# Patient Record
Sex: Male | Born: 1950 | Race: White | Hispanic: No | Smoking: Current every day smoker
Health system: Southern US, Community
[De-identification: ages and names within clinical notes are randomized; demographics above are authoritative.]

## PROBLEM LIST (undated history)

## (undated) DIAGNOSIS — F419 Anxiety disorder, unspecified: Secondary | ICD-10-CM

## (undated) DIAGNOSIS — G4730 Sleep apnea, unspecified: Secondary | ICD-10-CM

## (undated) DIAGNOSIS — Z8719 Personal history of other diseases of the digestive system: Secondary | ICD-10-CM

## (undated) DIAGNOSIS — K2270 Barrett's esophagus without dysplasia: Secondary | ICD-10-CM

## (undated) DIAGNOSIS — I10 Essential (primary) hypertension: Secondary | ICD-10-CM

## (undated) DIAGNOSIS — Z8619 Personal history of other infectious and parasitic diseases: Secondary | ICD-10-CM

## (undated) DIAGNOSIS — R0602 Shortness of breath: Secondary | ICD-10-CM

## (undated) DIAGNOSIS — Z87898 Personal history of other specified conditions: Secondary | ICD-10-CM

## (undated) DIAGNOSIS — E785 Hyperlipidemia, unspecified: Secondary | ICD-10-CM

## (undated) DIAGNOSIS — K219 Gastro-esophageal reflux disease without esophagitis: Secondary | ICD-10-CM

## (undated) HISTORY — DX: Personal history of other infectious and parasitic diseases: Z86.19

## (undated) HISTORY — DX: Anxiety disorder, unspecified: F41.9

## (undated) HISTORY — DX: Sleep apnea, unspecified: G47.30

## (undated) HISTORY — PX: NECK SURGERY: SHX720

## (undated) HISTORY — PX: ABDOMINAL SURGERY: SHX537

## (undated) HISTORY — PX: HERNIA REPAIR: SHX51

## (undated) HISTORY — DX: Hyperlipidemia, unspecified: E78.5

---
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
## 1997-01-11 HISTORY — PX: NISSEN FUNDOPLICATION: SHX2091

## 1997-02-06 DIAGNOSIS — K2270 Barrett's esophagus without dysplasia: Secondary | ICD-10-CM | POA: Insufficient documentation

## 1997-02-06 DIAGNOSIS — I10 Essential (primary) hypertension: Secondary | ICD-10-CM | POA: Insufficient documentation

## 2003-02-19 ENCOUNTER — Encounter: Admission: RE | Admit: 2003-02-19 | Discharge: 2003-02-19 | Payer: Self-pay | Admitting: Neurosurgery

## 2003-03-12 ENCOUNTER — Ambulatory Visit (HOSPITAL_COMMUNITY): Admission: RE | Admit: 2003-03-12 | Discharge: 2003-03-13 | Payer: Self-pay | Admitting: Neurosurgery

## 2003-04-03 ENCOUNTER — Encounter: Admission: RE | Admit: 2003-04-03 | Discharge: 2003-04-03 | Payer: Self-pay | Admitting: Neurosurgery

## 2003-10-21 ENCOUNTER — Ambulatory Visit: Payer: Self-pay | Admitting: Family Medicine

## 2003-11-26 ENCOUNTER — Emergency Department: Payer: Self-pay | Admitting: Emergency Medicine

## 2003-12-03 ENCOUNTER — Other Ambulatory Visit: Payer: Self-pay

## 2003-12-04 ENCOUNTER — Ambulatory Visit: Payer: Self-pay | Admitting: Unknown Physician Specialty

## 2003-12-04 HISTORY — PX: BACK SURGERY: SHX140

## 2004-02-14 ENCOUNTER — Ambulatory Visit: Payer: Self-pay | Admitting: Physician Assistant

## 2004-08-14 ENCOUNTER — Ambulatory Visit: Payer: Self-pay | Admitting: Family Medicine

## 2004-08-20 ENCOUNTER — Ambulatory Visit: Payer: Self-pay | Admitting: Podiatry

## 2005-03-05 ENCOUNTER — Ambulatory Visit: Payer: Self-pay | Admitting: Gastroenterology

## 2005-12-05 DIAGNOSIS — F339 Major depressive disorder, recurrent, unspecified: Secondary | ICD-10-CM | POA: Insufficient documentation

## 2005-12-07 DIAGNOSIS — M545 Low back pain, unspecified: Secondary | ICD-10-CM | POA: Insufficient documentation

## 2005-12-07 DIAGNOSIS — G8929 Other chronic pain: Secondary | ICD-10-CM | POA: Insufficient documentation

## 2005-12-17 ENCOUNTER — Ambulatory Visit: Payer: Self-pay | Admitting: Family Medicine

## 2006-03-04 ENCOUNTER — Ambulatory Visit: Payer: Self-pay | Admitting: Physician Assistant

## 2006-03-18 ENCOUNTER — Other Ambulatory Visit: Payer: Self-pay

## 2006-03-18 ENCOUNTER — Ambulatory Visit: Payer: Self-pay | Admitting: Unknown Physician Specialty

## 2006-03-25 ENCOUNTER — Ambulatory Visit: Payer: Self-pay | Admitting: Unknown Physician Specialty

## 2006-04-28 DIAGNOSIS — N529 Male erectile dysfunction, unspecified: Secondary | ICD-10-CM | POA: Insufficient documentation

## 2006-08-13 DIAGNOSIS — Z8249 Family history of ischemic heart disease and other diseases of the circulatory system: Secondary | ICD-10-CM | POA: Insufficient documentation

## 2006-08-15 DIAGNOSIS — E785 Hyperlipidemia, unspecified: Secondary | ICD-10-CM | POA: Insufficient documentation

## 2006-10-10 HISTORY — PX: OTHER SURGICAL HISTORY: SHX169

## 2006-12-14 DIAGNOSIS — M25549 Pain in joints of unspecified hand: Secondary | ICD-10-CM | POA: Insufficient documentation

## 2007-03-08 DIAGNOSIS — J309 Allergic rhinitis, unspecified: Secondary | ICD-10-CM | POA: Insufficient documentation

## 2007-04-05 ENCOUNTER — Ambulatory Visit: Payer: Self-pay | Admitting: Unknown Physician Specialty

## 2007-04-09 HISTORY — PX: UPPER GI ENDOSCOPY: SHX6162

## 2007-12-25 DIAGNOSIS — E291 Testicular hypofunction: Secondary | ICD-10-CM | POA: Insufficient documentation

## 2008-05-03 ENCOUNTER — Ambulatory Visit: Payer: Self-pay | Admitting: Gastroenterology

## 2008-05-06 ENCOUNTER — Ambulatory Visit: Payer: Self-pay | Admitting: Gastroenterology

## 2008-05-06 DIAGNOSIS — L570 Actinic keratosis: Secondary | ICD-10-CM | POA: Insufficient documentation

## 2008-10-26 ENCOUNTER — Emergency Department: Payer: Self-pay | Admitting: Emergency Medicine

## 2008-10-27 ENCOUNTER — Emergency Department: Payer: Self-pay | Admitting: Emergency Medicine

## 2009-07-22 DIAGNOSIS — G4700 Insomnia, unspecified: Secondary | ICD-10-CM | POA: Insufficient documentation

## 2010-03-25 ENCOUNTER — Emergency Department: Payer: Self-pay | Admitting: Emergency Medicine

## 2010-05-24 ENCOUNTER — Inpatient Hospital Stay: Payer: Self-pay | Admitting: Psychiatry

## 2010-05-27 LAB — PSA

## 2010-05-28 DIAGNOSIS — Z915 Personal history of self-harm: Secondary | ICD-10-CM | POA: Insufficient documentation

## 2011-06-18 ENCOUNTER — Ambulatory Visit: Payer: Self-pay | Admitting: Unknown Physician Specialty

## 2011-06-18 LAB — HM COLONOSCOPY: HM Colonoscopy: NORMAL

## 2011-06-22 LAB — PATHOLOGY REPORT

## 2012-07-14 ENCOUNTER — Ambulatory Visit: Payer: Self-pay | Admitting: Family Medicine

## 2012-07-14 LAB — URINALYSIS, COMPLETE
Bilirubin,UR: NEGATIVE
Blood: NEGATIVE
Glucose,UR: NEGATIVE mg/dL (ref 0–75)
Ketone: NEGATIVE
Leukocyte Esterase: NEGATIVE
Nitrite: NEGATIVE
Ph: 8 (ref 4.5–8.0)
Protein: NEGATIVE
Specific Gravity: 1.01 (ref 1.003–1.030)
Squamous Epithelial: NONE SEEN
WBC UR: NONE SEEN /HPF (ref 0–5)

## 2012-07-14 LAB — GC/CHLAMYDIA PROBE AMP

## 2012-07-16 LAB — URINE CULTURE

## 2012-10-18 ENCOUNTER — Encounter (HOSPITAL_COMMUNITY): Payer: Self-pay | Admitting: Emergency Medicine

## 2012-10-18 ENCOUNTER — Emergency Department: Payer: Self-pay | Admitting: Emergency Medicine

## 2012-10-18 ENCOUNTER — Encounter (HOSPITAL_COMMUNITY): Payer: 59 | Admitting: Anesthesiology

## 2012-10-18 ENCOUNTER — Encounter (HOSPITAL_COMMUNITY): Admission: EM | Discharge status: Against Medical Advice | Payer: Self-pay | Source: Home / Self Care

## 2012-10-18 ENCOUNTER — Emergency Department (HOSPITAL_COMMUNITY)
Admission: EM | Admit: 2012-10-18 | Discharge: 2012-10-18 | Disposition: A | Discharge status: Home / Self Care | Payer: 59 | Attending: Orthopedic Surgery | Admitting: Orthopedic Surgery

## 2012-10-18 ENCOUNTER — Emergency Department (HOSPITAL_COMMUNITY): Payer: 59 | Admitting: Anesthesiology

## 2012-10-18 DIAGNOSIS — S61509A Unspecified open wound of unspecified wrist, initial encounter: Secondary | ICD-10-CM | POA: Insufficient documentation

## 2012-10-18 DIAGNOSIS — S66909A Unspecified injury of unspecified muscle, fascia and tendon at wrist and hand level, unspecified hand, initial encounter: Secondary | ICD-10-CM | POA: Insufficient documentation

## 2012-10-18 DIAGNOSIS — I10 Essential (primary) hypertension: Secondary | ICD-10-CM | POA: Insufficient documentation

## 2012-10-18 DIAGNOSIS — S55809A Unspecified injury of other blood vessels at forearm level, unspecified arm, initial encounter: Secondary | ICD-10-CM | POA: Insufficient documentation

## 2012-10-18 DIAGNOSIS — W268XXA Contact with other sharp object(s), not elsewhere classified, initial encounter: Secondary | ICD-10-CM | POA: Insufficient documentation

## 2012-10-18 HISTORY — PX: NERVE, TENDON AND ARTERY REPAIR: SHX5695

## 2012-10-18 HISTORY — DX: Gastro-esophageal reflux disease without esophagitis: K21.9

## 2012-10-18 HISTORY — PX: WOUND EXPLORATION: SHX6188

## 2012-10-18 HISTORY — DX: Essential (primary) hypertension: I10

## 2012-10-18 LAB — CBC
HCT: 38.9 % — ABNORMAL LOW (ref 39.0–52.0)
Hemoglobin: 13.2 g/dL (ref 13.0–17.0)
MCH: 31.5 pg (ref 26.0–34.0)
MCHC: 33.9 g/dL (ref 30.0–36.0)
MCV: 92.8 fL (ref 78.0–100.0)
Platelets: 184 10*3/uL (ref 150–400)
RBC: 4.19 MIL/uL — ABNORMAL LOW (ref 4.22–5.81)
RDW: 12.5 % (ref 11.5–15.5)
WBC: 9.5 10*3/uL (ref 4.0–10.5)

## 2012-10-18 LAB — BASIC METABOLIC PANEL
BUN: 17 mg/dL (ref 6–23)
CO2: 30 mEq/L (ref 19–32)
Calcium: 8.8 mg/dL (ref 8.4–10.5)
Chloride: 103 mEq/L (ref 96–112)
Creatinine, Ser: 0.72 mg/dL (ref 0.50–1.35)
GFR calc Af Amer: >90 mL/min (ref >90)
GFR calc non Af Amer: >90 mL/min (ref >90)
Glucose, Bld: 111 mg/dL — ABNORMAL HIGH (ref 70–99)
Potassium: 4.5 mEq/L (ref 3.5–5.1)
Sodium: 140 mEq/L (ref 135–145)

## 2012-10-18 SURGERY — NERVE, TENDON AND ARTERY REPAIR
Anesthesia: General | Laterality: Left | Wound class: Contaminated

## 2012-10-18 MED ORDER — CEFAZOLIN SODIUM-DEXTROSE 2-3 GM-% IV SOLR
INTRAVENOUS | Status: DC | PRN
  Administered 2012-10-18: 2 g via INTRAVENOUS

## 2012-10-18 MED ORDER — EPHEDRINE SULFATE 50 MG/ML IJ SOLN
INTRAMUSCULAR | Status: DC | PRN
  Administered 2012-10-18: 5 mg via INTRAVENOUS
  Administered 2012-10-18: 10 mg via INTRAVENOUS

## 2012-10-18 MED ORDER — SUCCINYLCHOLINE CHLORIDE 20 MG/ML IJ SOLN
INTRAMUSCULAR | Status: DC | PRN
  Administered 2012-10-18: 100 mg via INTRAVENOUS

## 2012-10-18 MED ORDER — LACTATED RINGERS IV SOLN
INTRAVENOUS | Status: DC | PRN
  Administered 2012-10-18 (×2): via INTRAVENOUS

## 2012-10-18 MED ORDER — 0.9 % SODIUM CHLORIDE (POUR BTL) OPTIME
TOPICAL | Status: DC | PRN
  Administered 2012-10-18: 1000 mL

## 2012-10-18 MED ORDER — MIDAZOLAM HCL 5 MG/5ML IJ SOLN
INTRAMUSCULAR | Status: DC | PRN
  Administered 2012-10-18: 2 mg via INTRAVENOUS

## 2012-10-18 MED ORDER — CEPHALEXIN 500 MG PO CAPS
500.0000 mg | ORAL_CAPSULE | Freq: Three times a day (TID) | ORAL | Status: DC
  Administered 2012-10-18: 500 mg via ORAL
  Filled 2012-10-18 (×4): qty 1

## 2012-10-18 MED ORDER — HYDROMORPHONE HCL PF 1 MG/ML IJ SOLN
INTRAMUSCULAR | Status: AC
  Filled 2012-10-18: qty 1

## 2012-10-18 MED ORDER — DOCUSATE SODIUM 100 MG PO CAPS: 100.0000 mg | ORAL_CAPSULE | Freq: Two times a day (BID) | ORAL | Status: DC

## 2012-10-18 MED ORDER — OXYCODONE HCL 5 MG PO TABS
ORAL_TABLET | ORAL | Status: AC
  Filled 2012-10-18: qty 1

## 2012-10-18 MED ORDER — OXYCODONE HCL 5 MG PO TABS
5.0000 mg | ORAL_TABLET | Freq: Once | ORAL | Status: AC | PRN
  Administered 2012-10-18: 5 mg via ORAL

## 2012-10-18 MED ORDER — OXYCODONE-ACETAMINOPHEN 5-325 MG PO TABS: 1.0000 | ORAL_TABLET | ORAL | Status: DC | PRN

## 2012-10-18 MED ORDER — OXYCODONE HCL 5 MG/5ML PO SOLN: 5.0000 mg | Freq: Once | ORAL | Status: AC | PRN

## 2012-10-18 MED ORDER — FENTANYL CITRATE 0.05 MG/ML IJ SOLN
INTRAMUSCULAR | Status: DC | PRN
  Administered 2012-10-18 (×5): 50 ug via INTRAVENOUS

## 2012-10-18 MED ORDER — BUPIVACAINE HCL (PF) 0.25 % IJ SOLN
INTRAMUSCULAR | Status: AC
  Filled 2012-10-18: qty 30

## 2012-10-18 MED ORDER — LIDOCAINE HCL (CARDIAC) 20 MG/ML IV SOLN
INTRAVENOUS | Status: DC | PRN
  Administered 2012-10-18: 50 mg via INTRAVENOUS

## 2012-10-18 MED ORDER — HYDROMORPHONE HCL PF 1 MG/ML IJ SOLN
0.2500 mg | INTRAMUSCULAR | Status: DC | PRN
  Administered 2012-10-18 (×4): 0.5 mg via INTRAVENOUS

## 2012-10-18 MED ORDER — ONDANSETRON HCL 4 MG/2ML IJ SOLN
INTRAMUSCULAR | Status: DC | PRN
  Administered 2012-10-18: 4 mg via INTRAMUSCULAR

## 2012-10-18 MED ORDER — ARTIFICIAL TEARS OP OINT
TOPICAL_OINTMENT | OPHTHALMIC | Status: DC | PRN
  Administered 2012-10-18: 1 via OPHTHALMIC

## 2012-10-18 MED ORDER — ONDANSETRON HCL 4 MG/2ML IJ SOLN: 4.0000 mg | Freq: Once | INTRAMUSCULAR | Status: DC | PRN

## 2012-10-18 MED ORDER — PROPOFOL 10 MG/ML IV BOLUS
INTRAVENOUS | Status: DC | PRN
  Administered 2012-10-18: 200 mg via INTRAVENOUS

## 2012-10-18 MED ORDER — ASPIRIN EC 325 MG PO TBEC: 325.0000 mg | DELAYED_RELEASE_TABLET | Freq: Every day | ORAL | Status: DC

## 2012-10-18 MED ORDER — SODIUM CHLORIDE 0.9 % IR SOLN
Status: DC | PRN
  Administered 2012-10-18: 21:00:00

## 2012-10-18 MED ORDER — BUPIVACAINE HCL (PF) 0.25 % IJ SOLN: INTRAMUSCULAR | Status: DC | PRN

## 2012-10-18 MED ORDER — CEFAZOLIN SODIUM-DEXTROSE 2-3 GM-% IV SOLR
INTRAVENOUS | Status: AC
  Filled 2012-10-18: qty 50

## 2012-10-18 SURGICAL SUPPLY — 54 items
APPLIER CLIP 9.375 SM OPEN (CLIP)
BAG DECANTER FOR FLEXI CONT (MISCELLANEOUS) ×2 IMPLANT
BANDAGE ELASTIC 3 VELCRO ST LF (GAUZE/BANDAGES/DRESSINGS) ×2 IMPLANT
BANDAGE ELASTIC 4 VELCRO ST LF (GAUZE/BANDAGES/DRESSINGS) ×2 IMPLANT
BANDAGE GAUZE ELAST BULKY 4 IN (GAUZE/BANDAGES/DRESSINGS) ×2 IMPLANT
BNDG ESMARK 4X9 LF (GAUZE/BANDAGES/DRESSINGS) ×2 IMPLANT
CLIP APPLIE 9.375 SM OPEN (CLIP) IMPLANT
CLOTH BEACON ORANGE TIMEOUT ST (SAFETY) ×2 IMPLANT
CORDS BIPOLAR (ELECTRODE) ×2 IMPLANT
COVER SURGICAL LIGHT HANDLE (MISCELLANEOUS) ×2 IMPLANT
CUFF TOURNIQUET SINGLE 18IN (TOURNIQUET CUFF) ×2 IMPLANT
CUFF TOURNIQUET SINGLE 24IN (TOURNIQUET CUFF) IMPLANT
DRAPE OEC MINIVIEW 54X84 (DRAPES) IMPLANT
DRAPE SURG 17X23 STRL (DRAPES) ×2 IMPLANT
DRSG ADAPTIC 3X8 NADH LF (GAUZE/BANDAGES/DRESSINGS) IMPLANT
GAUZE XEROFORM 5X9 LF (GAUZE/BANDAGES/DRESSINGS) ×2 IMPLANT
GEL ULTRASOUND 20GR AQUASONIC (MISCELLANEOUS) IMPLANT
GLOVE BIOGEL PI IND STRL 7.0 (GLOVE) ×2 IMPLANT
GLOVE BIOGEL PI IND STRL 8.5 (GLOVE) ×1 IMPLANT
GLOVE BIOGEL PI INDICATOR 7.0 (GLOVE) ×2
GLOVE BIOGEL PI INDICATOR 8.5 (GLOVE) ×1
GLOVE SURG ORTHO 8.0 STRL STRW (GLOVE) ×2 IMPLANT
GLOVE SURG SS PI 7.0 STRL IVOR (GLOVE) ×6 IMPLANT
GOWN PREVENTION PLUS XLARGE (GOWN DISPOSABLE) ×2 IMPLANT
GOWN STRL NON-REIN LRG LVL3 (GOWN DISPOSABLE) ×4 IMPLANT
KIT BASIN OR (CUSTOM PROCEDURE TRAY) ×2 IMPLANT
KIT ROOM TURNOVER OR (KITS) ×2 IMPLANT
LOOP VESSEL MAXI BLUE (MISCELLANEOUS) IMPLANT
MANIFOLD NEPTUNE II (INSTRUMENTS) ×2 IMPLANT
NEEDLE HYPO 25GX1X1/2 BEV (NEEDLE) IMPLANT
NS IRRIG 1000ML POUR BTL (IV SOLUTION) ×2 IMPLANT
PACK ORTHO EXTREMITY (CUSTOM PROCEDURE TRAY) ×2 IMPLANT
PAD ARMBOARD 7.5X6 YLW CONV (MISCELLANEOUS) ×4 IMPLANT
PAD CAST 4YDX4 CTTN HI CHSV (CAST SUPPLIES) IMPLANT
PADDING CAST COTTON 4X4 STRL (CAST SUPPLIES)
PADDING CAST SYNTHETIC 4 (CAST SUPPLIES) ×1
PADDING CAST SYNTHETIC 4X4 STR (CAST SUPPLIES) ×1 IMPLANT
SOAP 2 % CHG 4 OZ (WOUND CARE) ×2 IMPLANT
SPEAR EYE SURG WECK-CEL (MISCELLANEOUS) IMPLANT
SPLINT FIBERGLASS 3X12 (CAST SUPPLIES) ×2 IMPLANT
SPONGE GAUZE 4X4 12PLY (GAUZE/BANDAGES/DRESSINGS) ×2 IMPLANT
SUT FIBERWIRE 3-0 18 TAPR NDL (SUTURE) ×4
SUT FIBERWIRE 4-0 18 DIAM BLUE (SUTURE)
SUT MERSILENE 4 0 P 3 (SUTURE) IMPLANT
SUT PROLENE 4 0 PS 2 18 (SUTURE) ×6 IMPLANT
SUT PROLENE 7 0 P 1 (SUTURE) ×2 IMPLANT
SUTURE FIBERWR 3-0 18 TAPR NDL (SUTURE) ×2 IMPLANT
SUTURE FIBERWR 4-0 18 DIA BLUE (SUTURE) IMPLANT
SYR CONTROL 10ML LL (SYRINGE) IMPLANT
TOWEL OR 17X24 6PK STRL BLUE (TOWEL DISPOSABLE) ×2 IMPLANT
TOWEL OR 17X26 10 PK STRL BLUE (TOWEL DISPOSABLE) ×2 IMPLANT
TUBE CONNECTING 12X1/4 (SUCTIONS) IMPLANT
UNDERPAD 30X30 INCONTINENT (UNDERPADS AND DIAPERS) ×2 IMPLANT
WATER STERILE IRR 1000ML POUR (IV SOLUTION) ×2 IMPLANT

## 2012-10-18 NOTE — Transfer of Care (Signed)
Immediate Anesthesia Transfer of Care Note  Patient: Gary Carter  Procedure(s) Performed: Procedure(s): NERVE, TENDON AND ARTERY REPAIR (Left) WOUND EXPLORATION (Left)  Patient Location: PACU  Anesthesia Type:General  Level of Consciousness: awake, alert  and oriented  Airway & Oxygen Therapy: Patient Spontanous Breathing and Patient connected to nasal cannula oxygen  Post-op Assessment: Report given to PACU RN, Post -op Vital signs reviewed and stable and Patient moving all extremities  Post vital signs: Reviewed and stable  Complications: No apparent anesthesia complications

## 2012-10-18 NOTE — ED Notes (Signed)
Pt arrived from [facility] s/p laceration to left wrist with ulnar artery injury per carelink report. Dr Orlan Leavens aware of pt. Bleeding controlled at this time.

## 2012-10-18 NOTE — Brief Op Note (Signed)
10/18/2012  8:43 PM  PATIENT:  Gary Carter  62 y.o. male  PRE-OPERATIVE DIAGNOSIS:  Complex Laceration to Left Wrist  POST-OPERATIVE DIAGNOSIS:  same  PROCEDURE:  Procedure(s): NERVE, TENDON AND ARTERY REPAIR (Left)  SURGEON:  Surgeon(s) and Role:    * Sharma Covert, MD - Primary  PHYSICIAN ASSISTANT: same  ASSISTANTS: none   ANESTHESIA:   general  EBL:     BLOOD ADMINISTERED:none  DRAINS: none   LOCAL MEDICATIONS USED:  MARCAINE     SPECIMEN:  No Specimen  DISPOSITION OF SPECIMEN:  N/A  COUNTS:  YES  TOURNIQUET:    DICTATION: .Other Dictation: Dictation Number [id number]  PLAN OF CARE: Discharge to home after PACU  PATIENT DISPOSITION:  PACU - hemodynamically stable.   Delay start of Pharmacological VTE agent (>24hrs) due to surgical blood loss or risk of bleeding: not applicable

## 2012-10-18 NOTE — H&P (Signed)
Gary Carter is an 62 y.o. male.   Chief Complaint: left wrist laceration with tendon and arterial involvement  HPI: left wrist laceration pt seen in [place] with uncontrolled bleeding Pt transferred to [place] for definitive treatment No prior injury to left wrist    Past Medical History  Diagnosis Date  . Hypertension   . GERD (gastroesophageal reflux disease)     Past Surgical History  Procedure Laterality Date  . Back surgery    . Abdominal surgery    . Hernia repair    . Neck surgery      History reviewed. No pertinent family history. Social History:  reports that he has been smoking Cigarettes.  He has a 2.5 pack-year smoking history. He does not have any smokeless tobacco history on file. He reports that he drinks alcohol. His drug history is not on file.  Allergies: No Known Allergies   (Not in a hospital admission)  Results for orders placed during the hospital encounter of 10/18/12 (from the past 48 hour(s))  CBC     Status: Abnormal   Collection Time    10/18/12  7:29 PM      Result Value Range   WBC 9.5  4.0 - 10.5 K/uL   RBC 4.19 (*) 4.22 - 5.81 MIL/uL   Hemoglobin 13.2  13.0 - 17.0 g/dL   HCT 16.1 (*) 09.6 - 04.5 %   MCV 92.8  78.0 - 100.0 fL   MCH 31.5  26.0 - 34.0 pg   MCHC 33.9  30.0 - 36.0 g/dL   RDW 40.9  81.1 - 91.4 %   Platelets 184  150 - 400 K/uL  BASIC METABOLIC PANEL     Status: Abnormal   Collection Time    10/18/12  7:29 PM      Result Value Range   Sodium 140  135 - 145 mEq/L   Potassium 4.5  3.5 - 5.1 mEq/L   Chloride 103  96 - 112 mEq/L   CO2 30  19 - 32 mEq/L   Glucose, Bld 111 (*) 70 - 99 mg/dL   BUN 17  6 - 23 mg/dL   Creatinine, Ser 7.82  0.50 - 1.35 mg/dL   Calcium 8.8  8.4 - 95.6 mg/dL   GFR calc non Af Amer >90  >90 mL/min   GFR calc Af Amer >90  >90 mL/min   Comment: (NOTE)     The eGFR has been calculated using the CKD EPI equation.     This calculation has not been validated in all clinical situations.   eGFR's persistently <90 mL/min signify possible Chronic Kidney     Disease.   No results found.  NO RECENT ILLNESSES OR HOSPITALIZATIONS  Blood pressure 138/93, pulse 64, temperature 98.7 F (37.1 C), temperature source Oral, resp. rate 10, height 5\' 9"  (1.753 m), weight 81.194 kg (179 lb), SpO2 94.00%. General Appearance:  Alert, cooperative, no distress, appears stated age  Head:  Normocephalic, without obvious abnormality, atraumatic  Eyes:  Pupils equal, conjunctiva/corneas clear,         Throat: Lips, mucosa, and tongue normal; teeth and gums normal  Neck: No visible masses     Lungs:   respirations unlabored  Chest Wall:  No tenderness or deformity  Heart:  Regular rate and rhythm,  Abdomen:   Soft, non-tender,         Extremities: LEFT WRIST: PRESSURE DRESSING IN PLACE ABLE TO MAKE FULL COMPOSITE FIST FDS/FDP INTACT TO SMALL/RING  AND LONG FINGER LIMITED WRIST AND FOREARM MOBILITY  Pulses: 2+ and symmetric  Skin: Skin color, texture, turgor normal, no rashes or lesions     Neurologic: Normal    Assessment/Plan LEFT WRIST LACERATION WITH ARTERIAL INVOLVEMENT AND POSSIBLE TENDON NERVE INVOLVEMENT  LEFT WRIST LACERATION WITH EXPLORATION AND REPAIR AS INDICATED  R/B/A DISCUSSED WITH PT IN ED.  PT VOICED UNDERSTANDING OF PLAN CONSENT SIGNED DAY OF SURGERY PT SEEN AND EXAMINED PRIOR TO OPERATIVE PROCEDURE/DAY OF SURGERY SITE MARKED. QUESTIONS ANSWERED WILL GO HOME FOLLOWING SURGERY  Sharma Covert 10/18/2012, 8:29 PM

## 2012-10-18 NOTE — Anesthesia Preprocedure Evaluation (Addendum)
Anesthesia Evaluation  Patient identified by MRN, date of birth, ID band Patient awake    Reviewed: Allergy & Precautions, H&P , NPO status , Patient's Chart, lab work & pertinent test results  Airway Mallampati: II TM Distance: >3 FB Neck ROM: Full    Dental  (+) Teeth Intact and Dental Advisory Given   Pulmonary  breath sounds clear to auscultation        Cardiovascular hypertension, Pt. on medications Rhythm:Regular     Neuro/Psych    GI/Hepatic GERD-  Medicated and Controlled,  Endo/Other    Renal/GU      Musculoskeletal   Abdominal   Peds  Hematology   Anesthesia Other Findings   Reproductive/Obstetrics                          Anesthesia Physical Anesthesia Plan  ASA: III and emergent  Anesthesia Plan: General   Post-op Pain Management:    Induction: Intravenous  Airway Management Planned: Oral ETT  Additional Equipment:   Intra-op Plan:   Post-operative Plan: Extubation in OR  Informed Consent:   Dental advisory given  Plan Discussed with: CRNA, Anesthesiologist and Surgeon  Anesthesia Plan Comments: (Wound l. Wrist hyypertension GERD S/P nissen fundoplication Smoker  Plan GA with oral ETT  Kipp Brood, MD)      Anesthesia Quick Evaluation

## 2012-10-18 NOTE — Anesthesia Procedure Notes (Signed)
Procedure Name: Intubation Date/Time: 10/18/2012 8:46 PM Performed by: Luster Landsberg Pre-anesthesia Checklist: Patient identified, Emergency Drugs available, Suction available and Patient being monitored Patient Re-evaluated:Patient Re-evaluated prior to inductionOxygen Delivery Method: Circle system utilized Preoxygenation: Pre-oxygenation with 100% oxygen Intubation Type: IV induction, Rapid sequence and Cricoid Pressure applied Laryngoscope Size: Mac and 3 Grade View: Grade I Tube type: Oral Tube size: 7.5 mm Number of attempts: 1 Airway Equipment and Method: Stylet Placement Confirmation: ETT inserted through vocal cords under direct vision,  positive ETCO2 and breath sounds checked- equal and bilateral Secured at: 23 cm Tube secured with: Tape Dental Injury: Teeth and Oropharynx as per pre-operative assessment

## 2012-10-19 NOTE — Anesthesia Postprocedure Evaluation (Signed)
  Anesthesia Post-op Note  Patient: Gary Carter  Procedure(s) Performed: Procedure(s): NERVE, TENDON AND ARTERY REPAIR (Left) WOUND EXPLORATION (Left)  Patient Location: PACU  Anesthesia Type:General  Level of Consciousness: awake, alert  and oriented  Airway and Oxygen Therapy: Patient Spontanous Breathing and Patient connected to nasal cannula oxygen  Post-op Pain: mild  Post-op Assessment: Post-op Vital signs reviewed, Patient's Cardiovascular Status Stable, Respiratory Function Stable, Patent Airway and Pain level controlled  Post-op Vital Signs: stable  Complications: No apparent anesthesia complications

## 2012-10-20 ENCOUNTER — Encounter (HOSPITAL_COMMUNITY): Payer: Self-pay | Admitting: Orthopedic Surgery

## 2012-10-20 NOTE — Op Note (Signed)
NAME:  Gary Carter, Gary Carter NO.:  [id number]  MEDICAL RECORD NO.:  [id number]  LOCATION:  [facility]                         FACILITY:  [facility]  PHYSICIAN:  Madelynn Done, MD  DATE OF BIRTH:  01/29/1950  DATE OF PROCEDURE:  10/18/2012 DATE OF DISCHARGE:  10/18/2012                              OPERATIVE REPORT   PREOPERATIVE DIAGNOSIS:  Left wrist laceration, 5 cm, with tendon and artery involvement.  POSTOPERATIVE DIAGNOSIS:  Left wrist laceration, 5 cm, with tendon and artery involvement.  ATTENDING PHYSICIAN:  Madelynn Done, MD, who scrubbed and present for the entire procedure.  ASSISTANT SURGEON:  None.  ANESTHESIA:  General via LMA.  SURGICAL PROCEDURES: 1. Left wrist primary arterial repair, ulnar artery. 2. Left wrist flexor carpi ulnaris, flexor tendon repair, wrist     flexor. 3. Left wrist ulnar nerve decompression and release at the level of     the wrist, neurolysis. 4. Left wrist traumatic laceration repair, 5 cm.  SURGICAL INDICATIONS:  Gary Carter is a right-hand-dominant gentleman, who was putting up a glass, the glass broken and unfortunately cut his left wrist.  The patient was seen and evaluated in the [facility] and transferred to [facility] for definitive treatment.  The patient was seen and evaluated and recommended to undergo the above procedure. Risks, benefits, and alternatives were discussed in detail with the patient and signed informed consent was obtained.  Risks include, but not limited to bleeding, infection, damage to nearby nerves, arteries, or tendons, loss of motion of wrist and digits, incomplete relief of symptoms, and need for further surgical intervention.  DESCRIPTION OF PROCEDURE:  The patient was properly identified in the preoperative holding area and marked with a permanent marker made on the left wrist to indicate the correct operative site.  The patient was then brought back to the operating room and  placed supine on the anesthesia table where general anesthesia was administered.  The patient tolerated this well.  A well-padded tourniquet was then placed on the left brachium and sealed with a 1000 drape.  The left upper extremity was then prepped and draped in a normal sterile fashion.  Time-out was called, correct side was identified and procedure was then begun. Attention was then turned to the left wrist where the 5-cm laceration was extended both proximally and distally.  After this was executed, the skin flaps were then tagged and then come back to the skin.  With adequate exposure, careful dissection was then carried out of the ulnar artery and nerve.  Neurolysis of the ulnar nerve was then carried out proximally and distally.  The nerve was then decompressed at the level of the wrist and was noted to be in continuity.  The patient did have a near complete transection of the ulnar artery.  The patient also had a complete transection of the FCU tendon.  Following this, attention was then turned to repair of the artery.  The vessel clamps were then applied to the artery.  The tourniquet was then deflated.  There was a small instrumentation was then used within the vessel to evacuate the clot.  The clamps were then released and  there was good flow below the proximal antegrade and retrograde flow.  Following this, the clamps were then placed back on the artery and primary repair was then done with 7-0 Prolene suture.  The clamps were removed.  There was good flow both proximally and distally following repair.  After repair of the artery, attention was then turned to repair of the FCU using the 3-0 FiberWire suture.  The FCU was then repaired with several figure-of-eight horizontal mattress sutures.  After the primary repair of the FCU, the wound was then thoroughly irrigated.  The traumatic laceration was then repaired with Prolene sutures and extended.  Incision was then  repaired with simple Prolene sutures.  Adaptic dressing and sterile compressive bandage were then applied.  The patient tolerated the procedure well, was placed in a short-arm splint, extubated, and taken to the recovery room in good condition.  POSTPROCEDURAL PLAN:  The patient was discharged to home, seen back in the office in approximately 10 days with antibiotics, pain management, aspirin, and so, I seen back in followup and then he will be sent down to see our therapist for a postoperative FCU protocol.     Madelynn Done, MD     FWO/MEDQ  D:  10/19/2012  T:  10/20/2012  Job:  [id number]

## 2013-02-21 ENCOUNTER — Ambulatory Visit: Payer: Self-pay | Admitting: Family Medicine

## 2013-08-10 ENCOUNTER — Ambulatory Visit: Payer: Self-pay | Admitting: Family Medicine

## 2014-01-15 LAB — BASIC METABOLIC PANEL
BUN: 14 mg/dL (ref 4–21)
Creatinine: 0.8 mg/dL (ref 0.6–1.3)
Glucose: 119 mg/dL
Potassium: 4.3 mmol/L (ref 3.4–5.3)
Sodium: 139 mmol/L (ref 137–147)

## 2014-01-15 LAB — LIPID PANEL
Cholesterol: 206 mg/dL — AB (ref 0–200)
HDL: 62 mg/dL (ref 35–70)
LDL Cholesterol: 124 mg/dL
Triglycerides: 100 mg/dL (ref 40–160)

## 2014-05-23 ENCOUNTER — Ambulatory Visit
Admission: RE | Admit: 2014-05-23 | Discharge: 2014-05-23 | Disposition: A | Discharge status: Home / Self Care | Payer: 59 | Source: Ambulatory Visit | Attending: Family Medicine | Admitting: Family Medicine

## 2014-05-23 ENCOUNTER — Other Ambulatory Visit: Payer: Self-pay | Admitting: Family Medicine

## 2014-05-23 DIAGNOSIS — M2010 Hallux valgus (acquired), unspecified foot: Secondary | ICD-10-CM | POA: Diagnosis present

## 2014-05-23 DIAGNOSIS — M19071 Primary osteoarthritis, right ankle and foot: Secondary | ICD-10-CM | POA: Insufficient documentation

## 2014-05-23 DIAGNOSIS — R52 Pain, unspecified: Secondary | ICD-10-CM

## 2014-05-23 DIAGNOSIS — M21611 Bunion of right foot: Secondary | ICD-10-CM

## 2014-06-11 DIAGNOSIS — R079 Chest pain, unspecified: Secondary | ICD-10-CM | POA: Insufficient documentation

## 2014-06-11 DIAGNOSIS — F419 Anxiety disorder, unspecified: Secondary | ICD-10-CM | POA: Insufficient documentation

## 2014-06-12 ENCOUNTER — Ambulatory Visit (INDEPENDENT_AMBULATORY_CARE_PROVIDER_SITE_OTHER): Payer: 59 | Admitting: Family Medicine

## 2014-06-12 ENCOUNTER — Encounter: Payer: Self-pay | Admitting: Family Medicine

## 2014-06-12 VITALS — BP 138/94 | HR 59 | Temp 97.9°F | Resp 16 | Wt 185.8 lb

## 2014-06-12 DIAGNOSIS — F410 Panic disorder [episodic paroxysmal anxiety] without agoraphobia: Secondary | ICD-10-CM | POA: Insufficient documentation

## 2014-06-12 DIAGNOSIS — L739 Follicular disorder, unspecified: Secondary | ICD-10-CM | POA: Diagnosis not present

## 2014-06-12 DIAGNOSIS — M21619 Bunion of unspecified foot: Secondary | ICD-10-CM | POA: Insufficient documentation

## 2014-06-12 DIAGNOSIS — Z8619 Personal history of other infectious and parasitic diseases: Secondary | ICD-10-CM

## 2014-06-12 DIAGNOSIS — G4733 Obstructive sleep apnea (adult) (pediatric): Secondary | ICD-10-CM | POA: Insufficient documentation

## 2014-06-12 DIAGNOSIS — G4700 Insomnia, unspecified: Secondary | ICD-10-CM | POA: Insufficient documentation

## 2014-06-12 HISTORY — DX: Personal history of other infectious and parasitic diseases: Z86.19

## 2014-06-12 MED ORDER — TRETINOIN MICROSPHERE 0.04 % EX GEL: Freq: Every day | CUTANEOUS | Status: DC

## 2014-06-12 MED ORDER — CEPHALEXIN 500 MG PO CAPS: 500.0000 mg | ORAL_CAPSULE | Freq: Four times a day (QID) | ORAL | Status: AC

## 2014-06-12 NOTE — Progress Notes (Signed)
Subjective:     Patient ID: Gary Carter, male   DOB: 1950-02-25, 64 y.o.   MRN: [id number]  HPI  He reports lump forming over posterior scrotal area 4 days ago and getting more and more painful. He has had similar lesions several times in the past for which he saw dermatologist and prescribed retin-a, but he states he is unable to get appointment with derm anytime soon.    Review of Systems  Constitutional: Negative for fever, chills and fatigue.  Genitourinary: Positive for scrotal swelling and testicular pain.      BP 138/94 mmHg  Pulse 59  Temp(Src) 97.9 F (36.6 C) (Oral)  Resp 16  Wt 185 lb 12.8 oz (84.278 kg)  SpO2 95%  Objective:   Physical Exam  Small tender follicular swelling with no erythema and no drainage scrotum as per HPI     Assessment:Plan        1. Folliculitis (Scrotum)  He states only thing that has really helped this in the past was retin-a prescribed by his dermatologist and he request rx. Will cover with abx as well.  Recommended he sit in sitz bath 10-15 minutes every night.  Call if not doing much better in a few days.  - cephALEXin (KEFLEX) 500 MG capsule; Take 1 capsule (500 mg total) by mouth 4 (four) times daily.  Dispense: 28 capsule; Refill: 0 - tretinoin microspheres (RETIN-A MICRO) 0.04 % gel; Apply topically at bedtime.  Dispense: 45 g; Refill: 0

## 2014-06-13 ENCOUNTER — Telehealth: Payer: Self-pay | Admitting: Family Medicine

## 2014-06-13 NOTE — Telephone Encounter (Signed)
Gary Carter with [facility] called to request the Rx for tretinoin microspheres gel to cream or 0.05 cream due to cost.  [phone]

## 2014-06-13 NOTE — Telephone Encounter (Signed)
Please advise 

## 2014-06-14 NOTE — Telephone Encounter (Signed)
Spoke with Pharmacist Katie and advised per Dr. Caryn Section that it is ok to change from gel to cream.

## 2014-06-14 NOTE — Telephone Encounter (Signed)
OK to change from Gel to Cream

## 2014-06-17 MED ORDER — HYDROCODONE-ACETAMINOPHEN 10-325 MG PO TABS
1.0000 | ORAL_TABLET | Freq: Four times a day (QID) | ORAL | Status: DC | PRN
Start: 2014-06-17 — End: 2014-06-25

## 2014-06-17 NOTE — Telephone Encounter (Signed)
Pt called for a Rx refill HYDROcodone-acetaminophen (NORCO) 10-325 MG per tablet.  EZ#[phone]/KV

## 2014-06-19 ENCOUNTER — Telehealth: Payer: Self-pay | Admitting: Family Medicine

## 2014-06-19 NOTE — Telephone Encounter (Signed)
Pt states he only rec'd 30 pill for his HYDROcodone-acetaminophen ([place]) 10-325 MG.  Pt states he has been getting 60 pills.  LG#[phone]/CQ

## 2014-06-20 NOTE — Telephone Encounter (Signed)
Pt advised to get the RX that was written for 30 pills filled and to call back and request a refill when he starts to get low. Thanks TNP

## 2014-06-20 NOTE — Telephone Encounter (Signed)
OK. Let us know when they are running out and we will fill #[phone] next time it needs refilled. Marland Kitchen

## 2014-06-24 ENCOUNTER — Telehealth: Payer: Self-pay | Admitting: Family Medicine

## 2014-06-24 NOTE — Telephone Encounter (Signed)
Pt called to see if this was ready for pick up and stated he only has 3 pills left. Pt request a call back. Thanks TNP

## 2014-06-24 NOTE — Telephone Encounter (Signed)
Pt contacted office for refill request on the following medications:HYDROcodone-acetaminophen ([place]) 10-325 MG.  Pt is requesting 60 pills.  JU#[phone]/VX

## 2014-06-25 MED ORDER — HYDROCODONE-ACETAMINOPHEN 10-325 MG PO TABS: 1.0000 | ORAL_TABLET | Freq: Four times a day (QID) | ORAL | Status: DC | PRN

## 2014-06-25 NOTE — Telephone Encounter (Signed)
Pt states he is completely out.  Pt would like to pick this Rx up as soon as possible/MJ

## 2014-06-25 NOTE — Telephone Encounter (Signed)
Patient is persistent that he get another refill for 30 more pills. Please advise?

## 2014-06-25 NOTE — Telephone Encounter (Signed)
Pt called again wanting to know if this was ready. Pt was upset that it isn't ready and that the RX was written for the wrong amount last time. Pt would like a call as soon as this is ready to be picked up. Thanks TNP

## 2014-07-08 ENCOUNTER — Other Ambulatory Visit: Payer: Self-pay | Admitting: Family Medicine

## 2014-07-08 ENCOUNTER — Telehealth: Payer: Self-pay | Admitting: Family Medicine

## 2014-07-08 MED ORDER — HYDROCODONE-ACETAMINOPHEN 10-325 MG PO TABS: 1.0000 | ORAL_TABLET | Freq: Four times a day (QID) | ORAL | Status: DC | PRN

## 2014-07-08 NOTE — Telephone Encounter (Signed)
Pt contacted office for refill request on the following medications:  HYDROcodone-acetaminophen (NORCO) 10-325 MG 60 pills.  ME#[phone]/WL

## 2014-07-19 ENCOUNTER — Telehealth: Payer: Self-pay | Admitting: Family Medicine

## 2014-07-19 NOTE — Telephone Encounter (Signed)
Pt needs to pick up refill for  HYDROCODON-ACETAMINOPHN 10-325 Thanks, Con Memos

## 2014-07-19 NOTE — Telephone Encounter (Signed)
Patient picked-up this rx 07/09/2014.

## 2014-07-22 ENCOUNTER — Encounter: Payer: Self-pay | Admitting: Family Medicine

## 2014-07-22 ENCOUNTER — Ambulatory Visit (INDEPENDENT_AMBULATORY_CARE_PROVIDER_SITE_OTHER): Payer: 59 | Admitting: Family Medicine

## 2014-07-22 VITALS — BP 132/88 | HR 64 | Temp 97.9°F | Resp 16 | Wt 182.8 lb

## 2014-07-22 DIAGNOSIS — S39012A Strain of muscle, fascia and tendon of lower back, initial encounter: Secondary | ICD-10-CM | POA: Diagnosis not present

## 2014-07-22 MED ORDER — HYDROCODONE-ACETAMINOPHEN 10-325 MG PO TABS: 1.0000 | ORAL_TABLET | Freq: Four times a day (QID) | ORAL | Status: DC | PRN

## 2014-07-22 MED ORDER — CYCLOBENZAPRINE HCL 5 MG PO TABS: 5.0000 mg | ORAL_TABLET | Freq: Three times a day (TID) | ORAL | Status: DC | PRN

## 2014-07-22 NOTE — Telephone Encounter (Signed)
Prescription printed

## 2014-07-22 NOTE — Progress Notes (Signed)
Subjective:     Patient ID: Gary Carter, male   DOB: May 14, 1950, 64 y.o.   MRN: [id number]  HPI  Chief Complaint  Patient presents with  . Back Pain    Patient comes in office today with concerns of back pain in the lower back radiating down to right leg. Patient states that pain began this morning after pushing on material at work  States he wishes to get a muscle relaxant. Already on pain medication for chronic back pain after back fusion years ago. Not consistently radicular in nature, the patient feels this is a back strain that he has experienced before.   Review of Systems     Objective:   Physical Exam  Constitutional: He appears well-developed and well-nourished. No distress.  Musculoskeletal:  Muscle strength in lower extremities 5/5. SLR's to 45 degrees with increased back pain but no radiation of pain. Localizes to his right upper paraspinous lumbar area       Assessment:    1. Low back strain, initial encounter - cyclobenzaprine (FLEXERIL) 5 MG tablet; Take 1 tablet (5 mg total) by mouth 3 (three) times daily as needed for muscle spasms.  Dispense: 21 tablet; Refill: 1    Plan:    Add two Aleve twice daily with food.

## 2014-07-22 NOTE — Patient Instructions (Signed)
Discussed adding two Aleve twice daily with food

## 2014-07-27 ENCOUNTER — Other Ambulatory Visit: Payer: Self-pay | Admitting: Family Medicine

## 2014-08-02 ENCOUNTER — Other Ambulatory Visit: Payer: Self-pay | Admitting: Family Medicine

## 2014-08-02 NOTE — Telephone Encounter (Signed)
Pt contacted office for refill request on the following medications:  HYDROcodone-acetaminophen ([place]) 10-325 MG.  OM#[phone]/FS

## 2014-08-02 NOTE — Telephone Encounter (Signed)
Pt pick-up last rx 07/22/2014.

## 2014-08-05 MED ORDER — HYDROCODONE-ACETAMINOPHEN 10-325 MG PO TABS: 1.0000 | ORAL_TABLET | Freq: Four times a day (QID) | ORAL | Status: DC | PRN

## 2014-08-16 ENCOUNTER — Telehealth: Payer: Self-pay | Admitting: Family Medicine

## 2014-08-16 NOTE — Telephone Encounter (Signed)
HYDROcodone-acetaminophen (NORCO) 10-325 MG per tablet 08/05/14 -- Birdie Sons, MD Take 1 tablet by mouth every 6 (six) hours as needed. Pt needs to pick up  Thanks, Con Memos

## 2014-08-16 NOTE — Telephone Encounter (Signed)
See bellow please.=aa

## 2014-08-19 MED ORDER — HYDROCODONE-ACETAMINOPHEN 10-325 MG PO TABS: 1.0000 | ORAL_TABLET | Freq: Four times a day (QID) | ORAL | Status: DC | PRN

## 2014-08-19 NOTE — Telephone Encounter (Signed)
Is ready to pick up.

## 2014-08-19 NOTE — Telephone Encounter (Signed)
Pt called to see if RX was ready and stated that he will be out tomorrow. Thanks TNP

## 2014-09-02 ENCOUNTER — Other Ambulatory Visit: Payer: Self-pay | Admitting: Family Medicine

## 2014-09-02 MED ORDER — HYDROCODONE-ACETAMINOPHEN 10-325 MG PO TABS: 1.0000 | ORAL_TABLET | Freq: Four times a day (QID) | ORAL | Status: DC | PRN

## 2014-09-02 NOTE — Telephone Encounter (Signed)
HYDROcodone-acetaminophen (NORCO) 10-325 MG per tablet 08/19/14 -- Birdie Sons, MD Take 1 tablet by mouth every 6 (six) hours as needed.  Can he please pick up RX by 230 pm today.  He has an appt. At [facility] today.  Thanks Con Memos

## 2014-09-17 ENCOUNTER — Other Ambulatory Visit: Payer: Self-pay | Admitting: Family Medicine

## 2014-09-17 MED ORDER — HYDROCODONE-ACETAMINOPHEN 10-325 MG PO TABS: 1.0000 | ORAL_TABLET | Freq: Four times a day (QID) | ORAL | Status: DC | PRN

## 2014-09-17 NOTE — Telephone Encounter (Signed)
Pt contacted office for refill request on the following medications:  HYDROcodone-acetaminophen ([place]) 10-325 MG.  CB#[phone]/MW   Pt states he is completely out/MW

## 2014-09-30 ENCOUNTER — Other Ambulatory Visit: Payer: Self-pay | Admitting: Family Medicine

## 2014-09-30 MED ORDER — HYDROCODONE-ACETAMINOPHEN 10-325 MG PO TABS: 1.0000 | ORAL_TABLET | Freq: Four times a day (QID) | ORAL | Status: DC | PRN

## 2014-09-30 NOTE — Telephone Encounter (Signed)
Pt contacted office for refill request on the following medications: HYDROcodone-acetaminophen (NORCO) 10-325 MG per tablet. Pt stated he will be out tomorrow. Thanks TNP

## 2014-10-14 ENCOUNTER — Other Ambulatory Visit: Payer: Self-pay | Admitting: Family Medicine

## 2014-10-14 MED ORDER — HYDROCODONE-ACETAMINOPHEN 10-325 MG PO TABS: 1.0000 | ORAL_TABLET | Freq: Four times a day (QID) | ORAL | Status: DC | PRN

## 2014-10-14 NOTE — Telephone Encounter (Signed)
REFILL  HYDROcodone-acetaminophen (NORCO) 10-325 MG per tablet 09/30/14 -- Birdie Sons, MD Take 1 tablet by mouth every 6 (six) hours as needed   Thanks, Con Memos

## 2014-10-14 NOTE — Telephone Encounter (Signed)
Pt called to see if RX was ready. Thanks TNP  °

## 2014-10-15 ENCOUNTER — Other Ambulatory Visit: Payer: Self-pay | Admitting: Family Medicine

## 2014-10-15 NOTE — Telephone Encounter (Signed)
Rx is ready to pick-up. Patient notified.

## 2014-10-15 NOTE — Telephone Encounter (Signed)
Pt called to see of RX is ready because he will be out today. RX wasn't up front. Please advise. Thanks TNP

## 2014-10-16 ENCOUNTER — Telehealth: Payer: Self-pay | Admitting: Family Medicine

## 2014-10-16 MED ORDER — CLONAZEPAM 0.5 MG PO TABS: 0.5000 mg | ORAL_TABLET | Freq: Two times a day (BID) | ORAL | Status: DC | PRN

## 2014-10-16 NOTE — Telephone Encounter (Signed)
Please advise refill? Clonazepam 0.5 mg (twice qd prn for anxiety/stress), is in the patient's medication history but not in the med list. Last refill 05/27/2014 #28 x0 refills.

## 2014-10-16 NOTE — Telephone Encounter (Signed)
Please call in clonazepam  

## 2014-10-16 NOTE — Telephone Encounter (Signed)
Pt contacted office for refill request on the following medications:  Clonazepam .05mg .  [facility] [place].  CB#[phone]/MW  Pt states he has just retired and having trouble with anxiety and sleeping at night/MW

## 2014-10-16 NOTE — Telephone Encounter (Signed)
Called in medication as below. Left message for patient saying that medication was called into the pharmacy.

## 2014-10-24 ENCOUNTER — Ambulatory Visit: Payer: 59

## 2014-10-25 ENCOUNTER — Other Ambulatory Visit: Payer: Self-pay | Admitting: Family Medicine

## 2014-10-25 NOTE — Telephone Encounter (Signed)
Pt contacted office for refill request on the following medications:  HYDROcodone-acetaminophen ([place]) 10-325 MG.  CB#[phone]/MW

## 2014-10-28 MED ORDER — HYDROCODONE-ACETAMINOPHEN 10-325 MG PO TABS: 1.0000 | ORAL_TABLET | Freq: Four times a day (QID) | ORAL | Status: DC | PRN

## 2014-11-11 ENCOUNTER — Other Ambulatory Visit: Payer: Self-pay | Admitting: Family Medicine

## 2014-11-11 MED ORDER — HYDROCODONE-ACETAMINOPHEN 10-325 MG PO TABS: 1.0000 | ORAL_TABLET | Freq: Four times a day (QID) | ORAL | Status: DC | PRN

## 2014-11-11 NOTE — Telephone Encounter (Signed)
Refill on HYDROcodone-acetaminophen (NORCO) 10-325 MG tablet   Thanks  teri

## 2014-11-13 ENCOUNTER — Other Ambulatory Visit: Payer: Self-pay | Admitting: Family Medicine

## 2014-11-13 MED ORDER — CLONAZEPAM 0.5 MG PO TABS: 0.5000 mg | ORAL_TABLET | Freq: Two times a day (BID) | ORAL | Status: DC | PRN

## 2014-11-13 NOTE — Telephone Encounter (Signed)
Rx called in to pharmacy. 

## 2014-11-13 NOTE — Telephone Encounter (Signed)
Please call in clonazepam  

## 2014-11-13 NOTE — Telephone Encounter (Signed)
clonazePAM (KLONOPIN) 0.5 MG tablet   Wants to know if can give a couple more refills  [facility] [place]

## 2014-11-20 ENCOUNTER — Other Ambulatory Visit: Payer: Self-pay | Admitting: Family Medicine

## 2014-11-20 NOTE — Telephone Encounter (Signed)
Please see refill request.

## 2014-11-25 ENCOUNTER — Other Ambulatory Visit: Payer: Self-pay | Admitting: Family Medicine

## 2014-11-25 NOTE — Telephone Encounter (Signed)
Pt needs refill HYDROcodone-acetaminophen (NORCO) 10-325 MG tablet ° ° °Thank sTeri °

## 2014-11-26 ENCOUNTER — Other Ambulatory Visit: Payer: Self-pay | Admitting: *Deleted

## 2014-11-26 MED ORDER — HYDROCODONE-ACETAMINOPHEN 10-325 MG PO TABS: 1.0000 | ORAL_TABLET | Freq: Four times a day (QID) | ORAL | Status: DC | PRN

## 2014-12-09 ENCOUNTER — Other Ambulatory Visit: Payer: Self-pay | Admitting: Family Medicine

## 2014-12-09 NOTE — Telephone Encounter (Signed)
Pt is requesting a refill on HYDROcodone-acetaminophen (NORCO) 10-325 MG tablet Please contact once it is ready for pickup

## 2014-12-10 MED ORDER — HYDROCODONE-ACETAMINOPHEN 10-325 MG PO TABS: 1.0000 | ORAL_TABLET | Freq: Four times a day (QID) | ORAL | Status: DC | PRN

## 2014-12-11 ENCOUNTER — Telehealth: Payer: Self-pay | Admitting: Family Medicine

## 2014-12-23 ENCOUNTER — Other Ambulatory Visit: Payer: Self-pay | Admitting: Family Medicine

## 2014-12-23 NOTE — Telephone Encounter (Signed)
Pt contacted office for refill request on the following medications: ° °HYDROcodone-acetaminophen (NORCO) 10-325 MG tablet  ° °CB#[phone]/MW °

## 2014-12-24 MED ORDER — HYDROCODONE-ACETAMINOPHEN 10-325 MG PO TABS: 1.0000 | ORAL_TABLET | Freq: Four times a day (QID) | ORAL | Status: DC | PRN

## 2014-12-27 ENCOUNTER — Ambulatory Visit: Payer: 59 | Admitting: Family Medicine

## 2014-12-31 ENCOUNTER — Encounter: Payer: Self-pay | Admitting: Family Medicine

## 2014-12-31 ENCOUNTER — Ambulatory Visit (INDEPENDENT_AMBULATORY_CARE_PROVIDER_SITE_OTHER): Payer: 59 | Admitting: Family Medicine

## 2014-12-31 VITALS — BP 132/90 | HR 77 | Temp 97.8°F | Resp 16 | Wt 190.0 lb

## 2014-12-31 DIAGNOSIS — K2270 Barrett's esophagus without dysplasia: Secondary | ICD-10-CM | POA: Diagnosis not present

## 2014-12-31 DIAGNOSIS — K2100 Gastro-esophageal reflux disease with esophagitis, without bleeding: Secondary | ICD-10-CM

## 2014-12-31 DIAGNOSIS — R1013 Epigastric pain: Secondary | ICD-10-CM | POA: Diagnosis not present

## 2014-12-31 MED ORDER — SUCRALFATE 1 G PO TABS: 1.0000 g | ORAL_TABLET | Freq: Three times a day (TID) | ORAL | Status: DC

## 2014-12-31 NOTE — Progress Notes (Signed)
Patient: Gary Carter Male    DOB: 1950-02-05   64 y.o.   MRN: [id number] Visit Date: 12/31/2014  Today's Provider: Lelon Huh, MD   Chief Complaint  Patient presents with  . Abdominal Pain    x 1 month   Subjective:    Abdominal Pain This is a new problem. Episode onset: started 1 month ago. The problem occurs intermittently. The problem has been waxing and waning. The pain is located in the periumbilical region. The quality of the pain is aching. Associated symptoms include melena and nausea. Pertinent negatives include no arthralgias, belching, constipation, diarrhea, dysuria, fever, flatus, frequency, headaches, myalgias, vomiting or weight loss. Relieved by: [place] and Omeprazole helps with reflux.  Has had several episodes of bloody stools over the last few weeks. He did have hiatal hernia repair at [facility] in 2010 and current pain feels similar to pain he was having at that time Has been more anxious since retiring which he feels may be aggravating  Is aggravated by spicy foods. Has also had more heartburn. Is compliant with omeprazole, sometimes taking 2 in a day.     No Known Allergies Previous Medications   AMLODIPINE (NORVASC) 10 MG TABLET    Take 1 tablet by mouth daily.   ASPIRIN 325 MG TABLET        CITALOPRAM (CELEXA) 20 MG TABLET    TAKE 1 TABLET BY MOUTH EVERY DAY   CLONAZEPAM (KLONOPIN) 0.5 MG TABLET    Take 1 tablet (0.5 mg total) by mouth 2 (two) times daily as needed for anxiety.   CLONIDINE (CATAPRES) 0.1 MG TABLET    TAKE 1 TABLET BY MOUTH TWICE A DAY   CYCLOBENZAPRINE (FLEXERIL) 5 MG TABLET    Take 1 tablet (5 mg total) by mouth 3 (three) times daily as needed for muscle spasms.   HYDROCODONE-ACETAMINOPHEN (NORCO) 10-325 MG TABLET    Take 1 tablet by mouth every 6 (six) hours as needed.   OMEPRAZOLE (PRILOSEC) 40 MG CAPSULE    TAKE ONE CAPSULE BY MOUTH EVERY DAY   VALSARTAN-HYDROCHLOROTHIAZIDE (DIOVAN-HCT) 320-25 MG PER TABLET    Take by mouth.     VARENICLINE (CHANTIX) 1 MG TABLET    Take by mouth.   VITAMIN E 400 UNIT CAPSULE    Take by mouth.    Review of Systems  Constitutional: Negative for fever, chills, weight loss and appetite change.  Respiratory: Negative for chest tightness, shortness of breath and wheezing.   Cardiovascular: Negative for chest pain and palpitations.  Gastrointestinal: Positive for nausea, abdominal pain, blood in stool and melena. Negative for vomiting, diarrhea, constipation and flatus.       Heart burn  Genitourinary: Negative for dysuria and frequency.  Musculoskeletal: Negative for myalgias and arthralgias.  Neurological: Negative for headaches.    Social History  Substance Use Topics  . Smoking status: Current Some Day Smoker -- 1.00 packs/day for 5 years    Types: Cigarettes  . Smokeless tobacco: Not on file  . Alcohol Use: 0.0 oz/week    0 Standard drinks or equivalent per week     Comment: drinks wine every night before bed   Objective:   BP 132/90 mmHg  Pulse 77  Temp(Src) 97.8 F (36.6 C) (Oral)  Resp 16  Wt 190 lb (86.183 kg)  SpO2 96%  Physical Exam   General Appearance:    Alert, cooperative, no distress  Eyes:    PERRL, conjunctiva/corneas clear, EOM's intact  Lungs:     Clear to auscultation bilaterally, respirations unlabored  Heart:    Regular rate and rhythm  Neurologic:   Awake, alert, oriented x 3. No apparent focal neurological           defect.   GI:   Mild tenderness upper abdomen around midline. No rebound or guarding. No CVAT. No masses.        Assessment & Plan:     1. Epigastric pain  - CBC - Amylase - Comprehensive metabolic panel - H Pylori, IGM, IGG, IGA AB - sucralfate (CARAFATE) 1 G tablet; Take 1 tablet (1 g total) by mouth 4 (four) times daily -  with meals and at bedtime.  Dispense: 120 tablet; Refill: 1 - Ambulatory referral to Gastroenterology - US Abdomen Complete; Future  2. Barrett esophagus  - Ambulatory referral to  Gastroenterology  3. Esophagitis, reflux        Lelon Huh, MD  [facility]

## 2015-01-02 LAB — AMYLASE: Amylase: 38 U/L (ref 31–124)

## 2015-01-02 LAB — COMPREHENSIVE METABOLIC PANEL
ALT: 32 IU/L (ref 0–44)
AST: 26 IU/L (ref 0–40)
Albumin/Globulin Ratio: 1.8 (ref 1.1–2.5)
Albumin: 4.1 g/dL (ref 3.6–4.8)
Alkaline Phosphatase: 58 IU/L (ref 39–117)
BUN/Creatinine Ratio: 17 (ref 10–22)
BUN: 15 mg/dL (ref 8–27)
Bilirubin Total: 0.5 mg/dL (ref 0.0–1.2)
CO2: 26 mmol/L (ref 18–29)
Calcium: 9.6 mg/dL (ref 8.6–10.2)
Chloride: 98 mmol/L (ref 96–106)
Creatinine, Ser: 0.88 mg/dL (ref 0.76–1.27)
GFR calc Af Amer: 106 mL/min/{1.73_m2} (ref >59)
GFR calc non Af Amer: 92 mL/min/{1.73_m2} (ref >59)
Globulin, Total: 2.3 g/dL (ref 1.5–4.5)
Glucose: 112 mg/dL — ABNORMAL HIGH (ref 65–99)
Potassium: 4 mmol/L (ref 3.5–5.2)
Sodium: 140 mmol/L (ref 134–144)
Total Protein: 6.4 g/dL (ref 6.0–8.5)

## 2015-01-02 LAB — H PYLORI, IGM, IGG, IGA AB
H Pylori IgG: <0.9 U/mL (ref 0.0–0.8)
H pylori, IgM Abs: <9 units (ref 0.0–8.9)
H. pylori, IgA Abs: <9 units (ref 0.0–8.9)

## 2015-01-02 LAB — CBC
Hematocrit: 42.4 % (ref 37.5–51.0)
Hemoglobin: 14.8 g/dL (ref 12.6–17.7)
MCH: 32.5 pg (ref 26.6–33.0)
MCHC: 34.9 g/dL (ref 31.5–35.7)
MCV: 93 fL (ref 79–97)
Platelets: 197 10*3/uL (ref 150–379)
RBC: 4.56 x10E6/uL (ref 4.14–5.80)
RDW: 12.8 % (ref 12.3–15.4)
WBC: 7 10*3/uL (ref 3.4–10.8)

## 2015-01-07 ENCOUNTER — Ambulatory Visit
Admission: RE | Admit: 2015-01-07 | Discharge: 2015-01-07 | Disposition: A | Discharge status: Home / Self Care | Payer: 59 | Source: Ambulatory Visit | Attending: Family Medicine | Admitting: Family Medicine

## 2015-01-07 ENCOUNTER — Telehealth: Payer: Self-pay

## 2015-01-07 DIAGNOSIS — R1013 Epigastric pain: Secondary | ICD-10-CM | POA: Diagnosis present

## 2015-01-07 DIAGNOSIS — N200 Calculus of kidney: Secondary | ICD-10-CM | POA: Insufficient documentation

## 2015-01-07 MED ORDER — HYDROCODONE-ACETAMINOPHEN 10-325 MG PO TABS: 1.0000 | ORAL_TABLET | Freq: Four times a day (QID) | ORAL | Status: DC | PRN

## 2015-01-07 NOTE — Telephone Encounter (Signed)
Patient called for his routine refill on Hydrocodone please. Please review. Thank you-aa

## 2015-01-10 ENCOUNTER — Telehealth: Payer: Self-pay | Admitting: Family Medicine

## 2015-01-10 NOTE — Telephone Encounter (Signed)
Pt called for results from the [place] he had earlier this week.  His call back is [phone]  Thanks, Con Memos

## 2015-01-14 ENCOUNTER — Telehealth: Payer: Self-pay | Admitting: Family Medicine

## 2015-01-14 NOTE — Telephone Encounter (Signed)
See result note.  

## 2015-01-14 NOTE — Telephone Encounter (Signed)
Pt is requesting a call back with ultrasound results

## 2015-01-14 NOTE — Telephone Encounter (Signed)
Notified. 

## 2015-01-21 ENCOUNTER — Other Ambulatory Visit: Payer: Self-pay | Admitting: Family Medicine

## 2015-01-21 MED ORDER — HYDROCODONE-ACETAMINOPHEN 10-325 MG PO TABS: 1.0000 | ORAL_TABLET | Freq: Four times a day (QID) | ORAL | Status: DC | PRN

## 2015-01-21 NOTE — Telephone Encounter (Signed)
Pt contacted office for refill request on the following medications:  HYDROcodone-acetaminophen (NORCO) 10-325 MG tablet.  [id number]

## 2015-01-22 ENCOUNTER — Other Ambulatory Visit: Payer: Self-pay | Admitting: Family Medicine

## 2015-01-22 MED ORDER — CLONAZEPAM 0.5 MG PO TABS: 0.5000 mg | ORAL_TABLET | Freq: Two times a day (BID) | ORAL | Status: DC | PRN

## 2015-01-22 NOTE — Telephone Encounter (Signed)
Please call in clonazepam  

## 2015-01-22 NOTE — Telephone Encounter (Signed)
Patient is requesting refill on clonazePAM (KLONOPIN) 0.5 MG tablet

## 2015-01-22 NOTE — Telephone Encounter (Signed)
[facility]  

## 2015-02-03 ENCOUNTER — Other Ambulatory Visit: Payer: Self-pay | Admitting: Family Medicine

## 2015-02-03 NOTE — Telephone Encounter (Signed)
Needs refill on HYDROcodone-acetaminophen (NORCO) 10-325 MG tablet 01/21/15 -- Birdie Sons, MD Take 1 tablet by mouth every 6 (six) hours as needed.   Thanks Con Memos

## 2015-02-03 NOTE — Telephone Encounter (Signed)
Last OV was 12/31/14. Med was last filled on 01/21/15.

## 2015-02-04 MED ORDER — HYDROCODONE-ACETAMINOPHEN 10-325 MG PO TABS: 1.0000 | ORAL_TABLET | Freq: Four times a day (QID) | ORAL | Status: DC | PRN

## 2015-02-17 ENCOUNTER — Other Ambulatory Visit: Payer: Self-pay | Admitting: Family Medicine

## 2015-02-17 NOTE — Telephone Encounter (Signed)
Pt contacted office for refill request on the following medications: °HYDROcodone-acetaminophen (NORCO) 10-325 MG tablet. Thanks TNP °

## 2015-02-18 MED ORDER — HYDROCODONE-ACETAMINOPHEN 10-325 MG PO TABS: 1.0000 | ORAL_TABLET | Freq: Four times a day (QID) | ORAL | Status: DC | PRN

## 2015-02-28 ENCOUNTER — Other Ambulatory Visit: Payer: Self-pay | Admitting: Family Medicine

## 2015-02-28 MED ORDER — HYDROCODONE-ACETAMINOPHEN 10-325 MG PO TABS: 1.0000 | ORAL_TABLET | Freq: Four times a day (QID) | ORAL | Status: DC | PRN

## 2015-02-28 NOTE — Telephone Encounter (Signed)
Pt needs refill HYDROcodone-acetaminophen (NORCO) 10-325 MG tablet   Thanks, Con Memos

## 2015-03-12 ENCOUNTER — Ambulatory Visit (INDEPENDENT_AMBULATORY_CARE_PROVIDER_SITE_OTHER): Payer: 59 | Admitting: Family Medicine

## 2015-03-12 ENCOUNTER — Encounter: Payer: Self-pay | Admitting: Family Medicine

## 2015-03-12 VITALS — BP 130/88 | HR 82 | Temp 98.5°F | Resp 16 | Ht 68.0 in | Wt 186.0 lb

## 2015-03-12 DIAGNOSIS — J0100 Acute maxillary sinusitis, unspecified: Secondary | ICD-10-CM

## 2015-03-12 DIAGNOSIS — M5441 Lumbago with sciatica, right side: Secondary | ICD-10-CM

## 2015-03-12 MED ORDER — AMOXICILLIN 500 MG PO CAPS: 1000.0000 mg | ORAL_CAPSULE | Freq: Two times a day (BID) | ORAL | Status: AC

## 2015-03-12 MED ORDER — HYDROCODONE-ACETAMINOPHEN 10-325 MG PO TABS: 1.0000 | ORAL_TABLET | Freq: Four times a day (QID) | ORAL | Status: DC | PRN

## 2015-03-12 NOTE — Progress Notes (Signed)
Patient: Gary Carter Male    DOB: Dec 30, 1950   65 y.o.   MRN: [id number] Visit Date: 03/12/2015  Today's Provider: Lelon Huh, MD   Chief Complaint  Patient presents with  . Cough   Subjective:    Cough This is a new problem. The current episode started in the past 7 days (2 days). The problem occurs every few minutes. The cough is productive of sputum. Associated symptoms include chills, ear pain, headaches, myalgias, nasal congestion, postnasal drip, rhinorrhea, a sore throat and sweats. Pertinent negatives include no chest pain, ear congestion, fever, heartburn, hemoptysis, rash, shortness of breath, weight loss or wheezing. The symptoms are aggravated by lying down and exercise. Treatments tried: cold and flu. The treatment provided mild relief. His past medical history is significant for bronchitis.    Cough for 2 days with congestion. Body aches, cough, and congestion. Symptoms are worse at night. Has chills and sweats at night. Having a lot of sinus congestion and yellow drainage.    No Known Allergies Previous Medications   AMLODIPINE (NORVASC) 10 MG TABLET    Take 1 tablet by mouth daily.   ASPIRIN 325 MG TABLET        CITALOPRAM (CELEXA) 20 MG TABLET    TAKE 1 TABLET BY MOUTH EVERY DAY   CLONAZEPAM (KLONOPIN) 0.5 MG TABLET    Take 1 tablet (0.5 mg total) by mouth 2 (two) times daily as needed for anxiety.   CLONIDINE (CATAPRES) 0.1 MG TABLET    TAKE 1 TABLET BY MOUTH TWICE A DAY   CYCLOBENZAPRINE (FLEXERIL) 5 MG TABLET    Take 1 tablet (5 mg total) by mouth 3 (three) times daily as needed for muscle spasms.   HYDROCODONE-ACETAMINOPHEN (NORCO) 10-325 MG TABLET    Take 1 tablet by mouth every 6 (six) hours as needed.   OMEPRAZOLE (PRILOSEC) 40 MG CAPSULE    TAKE ONE CAPSULE BY MOUTH EVERY DAY   SUCRALFATE (CARAFATE) 1 G TABLET    Take 1 tablet (1 g total) by mouth 4 (four) times daily -  with meals and at bedtime.   VALSARTAN-HYDROCHLOROTHIAZIDE (DIOVAN-HCT) 320-25  MG PER TABLET    Take by mouth.   VARENICLINE (CHANTIX) 1 MG TABLET    Take by mouth.   VITAMIN E 400 UNIT CAPSULE    Take by mouth.    Review of Systems  Constitutional: Positive for chills. Negative for fever, weight loss and appetite change.  HENT: Positive for ear pain, postnasal drip, rhinorrhea and sore throat.   Respiratory: Positive for cough. Negative for hemoptysis, chest tightness, shortness of breath and wheezing.   Cardiovascular: Negative for chest pain and palpitations.  Gastrointestinal: Negative for heartburn, nausea, vomiting and abdominal pain.  Musculoskeletal: Positive for myalgias.  Skin: Negative for rash.  Neurological: Positive for headaches.    Social History  Substance Use Topics  . Smoking status: Current Some Day Smoker -- 1.00 packs/day for 5 years    Types: Cigarettes  . Smokeless tobacco: Not on file  . Alcohol Use: 0.0 oz/week    0 Standard drinks or equivalent per week     Comment: drinks wine every night before bed   Objective:   BP 130/88 mmHg  Pulse 82  Temp(Src) 98.5 F (36.9 C) (Oral)  Resp 16  Ht 5\' 8"  (1.727 m)  Wt 186 lb (84.369 kg)  BMI 28.29 kg/m2  SpO2 95%  Physical Exam  General Appearance:    Alert,  cooperative, no distress  HENT:   bilateral TM normal without fluid or infection, maxillary sinus tender, post nasal drip noted and nasal mucosa pale and congested  Eyes:    PERRL, conjunctiva/corneas clear, EOM's intact       Lungs:     Clear to auscultation bilaterally, respirations unlabored  Heart:    Regular rate and rhythm  Neurologic:   Awake, alert, oriented x 3. No apparent focal neurological           defect.           Assessment & Plan:     1. Bilateral low back pain with right-sided sciatica  - amoxicillin (AMOXIL) 500 MG capsule; Take 2 capsules (1,000 mg total) by mouth 2 (two) times daily.  Dispense: 40 capsule; Refill: 0  2. Acute maxillary sinusitis, recurrence not specified He states hydrocodone is  working well, usually takes at least once a day. Has no adverse effects. Requests refill while he is here today.  - HYDROcodone-acetaminophen (NORCO) 10-325 MG tablet; Take 1 tablet by mouth every 6 (six) hours as needed.  Dispense: 60 tablet; Refill: 0       Lelon Huh, MD  [facility]

## 2015-03-13 ENCOUNTER — Telehealth: Payer: Self-pay | Admitting: Family Medicine

## 2015-03-13 NOTE — Telephone Encounter (Signed)
Please advise 

## 2015-03-13 NOTE — Telephone Encounter (Signed)
Pt states he came yesterday and got a Rx for sinus infection and the antibiotic is giving him diarrhea.  Pt is asking if he can get another Rx.  [facility] [place]  CB#[phone]/MW

## 2015-03-14 NOTE — Telephone Encounter (Signed)
He should take OTC probiotics twice a day until finished with antibiotic. Avoid dairy products. Can take an OTC Immodium if he needs to. Diarrhea can be a side effect of any antibiotic so it probably would not help to change to a different one

## 2015-03-14 NOTE — Telephone Encounter (Signed)
Patient was notified. Expressed understanding.  

## 2015-03-25 ENCOUNTER — Other Ambulatory Visit: Payer: Self-pay | Admitting: Family Medicine

## 2015-03-25 DIAGNOSIS — J0100 Acute maxillary sinusitis, unspecified: Secondary | ICD-10-CM

## 2015-03-25 MED ORDER — HYDROCODONE-ACETAMINOPHEN 10-325 MG PO TABS: 1.0000 | ORAL_TABLET | Freq: Four times a day (QID) | ORAL | Status: DC | PRN

## 2015-03-25 NOTE — Telephone Encounter (Signed)
Pt contacted office for refill request on the following medications: ° °HYDROcodone-acetaminophen (NORCO) 10-325 MG tablet  ° °CB#[phone]/MW °

## 2015-04-07 ENCOUNTER — Other Ambulatory Visit: Payer: Self-pay | Admitting: Family Medicine

## 2015-04-07 DIAGNOSIS — J0100 Acute maxillary sinusitis, unspecified: Secondary | ICD-10-CM

## 2015-04-07 MED ORDER — HYDROCODONE-ACETAMINOPHEN 10-325 MG PO TABS: 1.0000 | ORAL_TABLET | Freq: Four times a day (QID) | ORAL | Status: DC | PRN

## 2015-04-07 NOTE — Telephone Encounter (Signed)
Pt contacted office for refill request on the following medications: ° °HYDROcodone-acetaminophen (NORCO) 10-325 MG tablet  ° °CB#[phone]/MW °

## 2015-04-21 ENCOUNTER — Other Ambulatory Visit: Payer: Self-pay | Admitting: Family Medicine

## 2015-04-21 DIAGNOSIS — J0100 Acute maxillary sinusitis, unspecified: Secondary | ICD-10-CM

## 2015-04-21 MED ORDER — HYDROCODONE-ACETAMINOPHEN 10-325 MG PO TABS: 1.0000 | ORAL_TABLET | Freq: Four times a day (QID) | ORAL | Status: DC | PRN

## 2015-04-21 NOTE — Telephone Encounter (Signed)
Pt contacted office for refill request on the following medications: ° °HYDROcodone-acetaminophen (NORCO) 10-325 MG tablet  ° °CB#[phone]/MW °

## 2015-04-22 ENCOUNTER — Ambulatory Visit (INDEPENDENT_AMBULATORY_CARE_PROVIDER_SITE_OTHER): Payer: 59 | Admitting: Family Medicine

## 2015-04-22 ENCOUNTER — Encounter: Payer: Self-pay | Admitting: Family Medicine

## 2015-04-22 VITALS — BP 120/88 | HR 64 | Temp 98.0°F | Resp 16 | Ht 68.0 in | Wt 182.0 lb

## 2015-04-22 DIAGNOSIS — E785 Hyperlipidemia, unspecified: Secondary | ICD-10-CM | POA: Diagnosis not present

## 2015-04-22 DIAGNOSIS — M2550 Pain in unspecified joint: Secondary | ICD-10-CM | POA: Diagnosis not present

## 2015-04-22 DIAGNOSIS — I10 Essential (primary) hypertension: Secondary | ICD-10-CM | POA: Diagnosis not present

## 2015-04-22 DIAGNOSIS — Z125 Encounter for screening for malignant neoplasm of prostate: Secondary | ICD-10-CM

## 2015-04-22 DIAGNOSIS — M7632 Iliotibial band syndrome, left leg: Secondary | ICD-10-CM | POA: Diagnosis not present

## 2015-04-22 NOTE — Progress Notes (Signed)
Patient: Gary Carter Male    DOB: 09/26/1950   65 y.o.   MRN: [id number] Visit Date: 04/22/2015  Today's Provider: Lelon Huh, MD   Chief Complaint  Patient presents with  . Joint Pain   Subjective:    HPI  States that for the last month he has been having pains in knees elbow, wrists, and shoulders and getting worse. No known injuries. Medications haven't been helping. No fevers or chills. Knees feels a little swollen at times. No rashes. Also pain along lateral aspect of      No Known Allergies Previous Medications   AMLODIPINE (NORVASC) 10 MG TABLET    Take 1 tablet by mouth daily.   ASPIRIN 325 MG TABLET        CITALOPRAM (CELEXA) 20 MG TABLET    TAKE 1 TABLET BY MOUTH EVERY DAY   CLONAZEPAM (KLONOPIN) 0.5 MG TABLET    Take 1 tablet (0.5 mg total) by mouth 2 (two) times daily as needed for anxiety.   CLONIDINE (CATAPRES) 0.1 MG TABLET    TAKE 1 TABLET BY MOUTH TWICE A DAY   CYCLOBENZAPRINE (FLEXERIL) 5 MG TABLET    Take 1 tablet (5 mg total) by mouth 3 (three) times daily as needed for muscle spasms.   HYDROCODONE-ACETAMINOPHEN (NORCO) 10-325 MG TABLET    Take 1 tablet by mouth every 6 (six) hours as needed.   OMEPRAZOLE (PRILOSEC) 40 MG CAPSULE    TAKE ONE CAPSULE BY MOUTH EVERY DAY   SUCRALFATE (CARAFATE) 1 G TABLET    Take 1 tablet (1 g total) by mouth 4 (four) times daily -  with meals and at bedtime.   VALSARTAN-HYDROCHLOROTHIAZIDE (DIOVAN-HCT) 320-25 MG PER TABLET    Take by mouth.   VARENICLINE (CHANTIX) 1 MG TABLET    Take by mouth. Reported on 04/22/2015   VITAMIN E 400 UNIT CAPSULE    Take by mouth.    Review of Systems  Constitutional: Negative for fever, chills and appetite change.  Respiratory: Negative for chest tightness, shortness of breath and wheezing.   Cardiovascular: Negative for chest pain and palpitations.  Gastrointestinal: Negative for nausea, vomiting and abdominal pain.  Musculoskeletal: Positive for back pain, joint swelling and  arthralgias.    Social History  Substance Use Topics  . Smoking status: Current Some Day Smoker -- 1.00 packs/day for 5 years    Types: Cigarettes  . Smokeless tobacco: Not on file  . Alcohol Use: 0.0 oz/week    0 Standard drinks or equivalent per week     Comment: drinks wine every night before bed   Objective:   BP 120/88 mmHg  Pulse 64  Temp(Src) 98 F (36.7 C) (Oral)  Resp 16  Ht '5\' 8"'  (1.727 m)  Wt 182 lb (82.555 kg)  BMI 27.68 kg/m2  Physical Exam   General Appearance:    Alert, cooperative, no distress  Eyes:    PERRL, conjunctiva/corneas clear, EOM's intact       Lungs:     Clear to auscultation bilaterally, respirations unlabored  Heart:    Regular rate and rhythm  Neurologic:   Awake, alert, oriented x 3. No apparent focal neurological           defect.   MS:   Mild swelling both knees. Tender along joint lines of knees. Tender around lateral wrists bilaterally, no swelling. Tender around both elbows Pain with limits of ROM of both shoulders. Tender along left ITB  Assessment & Plan:     1. Arthralgias  - ANA w/Reflex if Positive - Rheumatoid factor - CYCLIC CITRUL PEPTIDE ANTIBODY, IGG/IGA - CBC - Sed Rate (ESR)  2. ITB Consider oral or injection of steroid.   2. Essential (primary) hypertension He is due for routine labs.  - Renal function panel - Hepatic function panel - Lipid panel - PSA       Lelon Huh, MD  [facility]

## 2015-04-23 ENCOUNTER — Other Ambulatory Visit: Payer: Self-pay | Admitting: Family Medicine

## 2015-04-24 ENCOUNTER — Telehealth: Payer: Self-pay

## 2015-04-24 MED ORDER — PREDNISONE 10 MG PO TABS: ORAL_TABLET | ORAL | Status: DC

## 2015-04-24 NOTE — Telephone Encounter (Signed)
Patient is calling the office because he was seen by Dr. Caryn Section on 04/22/10 and states that Dr. Caryn Section was suppose to [facility] [place] but never received it. Patient states that Dr. Caryn Section told him this would help with pain going down his leg. Patient would like a call back at [phone]. KW

## 2015-04-25 LAB — RHEUMATOID FACTOR: Rheumatoid fact SerPl-aCnc: <10 IU/mL (ref 0.0–13.9)

## 2015-04-25 LAB — CBC
Hematocrit: 46.5 % (ref 37.5–51.0)
Hemoglobin: 15.6 g/dL (ref 12.6–17.7)
MCH: 32.6 pg (ref 26.6–33.0)
MCHC: 33.5 g/dL (ref 31.5–35.7)
MCV: 97 fL (ref 79–97)
Platelets: 204 10*3/uL (ref 150–379)
RBC: 4.79 x10E6/uL (ref 4.14–5.80)
RDW: 13.2 % (ref 12.3–15.4)
WBC: 7.1 10*3/uL (ref 3.4–10.8)

## 2015-04-25 LAB — HEPATIC FUNCTION PANEL
ALT: 25 IU/L (ref 0–44)
AST: 19 IU/L (ref 0–40)
Alkaline Phosphatase: 59 IU/L (ref 39–117)
Bilirubin Total: 0.8 mg/dL (ref 0.0–1.2)
Bilirubin, Direct: 0.17 mg/dL (ref 0.00–0.40)
Total Protein: 7 g/dL (ref 6.0–8.5)

## 2015-04-25 LAB — RENAL FUNCTION PANEL
Albumin: 4.6 g/dL (ref 3.6–4.8)
BUN/Creatinine Ratio: 26 — ABNORMAL HIGH (ref 10–24)
BUN: 18 mg/dL (ref 8–27)
CO2: 26 mmol/L (ref 18–29)
Calcium: 9.6 mg/dL (ref 8.6–10.2)
Chloride: 97 mmol/L (ref 96–106)
Creatinine, Ser: 0.69 mg/dL — ABNORMAL LOW (ref 0.76–1.27)
GFR calc Af Amer: 118 mL/min/{1.73_m2} (ref >59)
GFR calc non Af Amer: 102 mL/min/{1.73_m2} (ref >59)
Glucose: 119 mg/dL — ABNORMAL HIGH (ref 65–99)
Phosphorus: 3.1 mg/dL (ref 2.5–4.5)
Potassium: 4.2 mmol/L (ref 3.5–5.2)
Sodium: 137 mmol/L (ref 134–144)

## 2015-04-25 LAB — LIPID PANEL
Chol/HDL Ratio: 3.4 ratio units (ref 0.0–5.0)
Cholesterol, Total: 191 mg/dL (ref 100–199)
HDL: 57 mg/dL (ref >39)
LDL Calculated: 114 mg/dL — ABNORMAL HIGH (ref 0–99)
Triglycerides: 101 mg/dL (ref 0–149)
VLDL Cholesterol Cal: 20 mg/dL (ref 5–40)

## 2015-04-25 LAB — ANA W/REFLEX IF POSITIVE: Anti Nuclear Antibody(ANA): NEGATIVE

## 2015-04-25 LAB — SEDIMENTATION RATE: Sed Rate: 2 mm/hr (ref 0–30)

## 2015-04-25 LAB — CYCLIC CITRUL PEPTIDE ANTIBODY, IGG/IGA: Cyclic Citrullin Peptide Ab: 12 units (ref 0–19)

## 2015-04-25 LAB — PSA: Prostate Specific Ag, Serum: 2.6 ng/mL (ref 0.0–4.0)

## 2015-05-05 ENCOUNTER — Other Ambulatory Visit: Payer: Self-pay | Admitting: Family Medicine

## 2015-05-05 DIAGNOSIS — J0100 Acute maxillary sinusitis, unspecified: Secondary | ICD-10-CM

## 2015-05-05 MED ORDER — HYDROCODONE-ACETAMINOPHEN 10-325 MG PO TABS: 1.0000 | ORAL_TABLET | Freq: Four times a day (QID) | ORAL | Status: DC | PRN

## 2015-05-05 NOTE — Telephone Encounter (Signed)
Pt needs refill on his  ° °HYDROcodone-acetaminophen (NORCO) 10-325 MG tablet ° °Thanks  Teri °

## 2015-05-19 ENCOUNTER — Other Ambulatory Visit: Payer: Self-pay | Admitting: Family Medicine

## 2015-05-19 DIAGNOSIS — J0100 Acute maxillary sinusitis, unspecified: Secondary | ICD-10-CM

## 2015-05-19 MED ORDER — HYDROCODONE-ACETAMINOPHEN 10-325 MG PO TABS: 1.0000 | ORAL_TABLET | Freq: Four times a day (QID) | ORAL | Status: DC | PRN

## 2015-05-19 NOTE — Telephone Encounter (Signed)
Pt contacted office for refill request on the following medications: HYDROcodone-acetaminophen (NORCO) 10-325 MG tablet. Last written on: 05/05/15 Last OV: 04/22/15 Please advise. Thanks TNP

## 2015-06-02 ENCOUNTER — Telehealth: Payer: Self-pay | Admitting: Family Medicine

## 2015-06-02 DIAGNOSIS — J0100 Acute maxillary sinusitis, unspecified: Secondary | ICD-10-CM

## 2015-06-02 MED ORDER — HYDROCODONE-ACETAMINOPHEN 10-325 MG PO TABS: 1.0000 | ORAL_TABLET | Freq: Four times a day (QID) | ORAL | Status: DC | PRN

## 2015-06-02 NOTE — Telephone Encounter (Signed)
Pt requesting his hydrocodone, last printed 05/19/15 for # 60 pills

## 2015-06-02 NOTE — Telephone Encounter (Signed)
Pt needs pain refill HYDROcodone-acetaminophen (NORCO) 10-325 MG tablet  Thank s, Con Memos

## 2015-06-16 ENCOUNTER — Other Ambulatory Visit: Payer: Self-pay | Admitting: Family Medicine

## 2015-06-16 DIAGNOSIS — J0100 Acute maxillary sinusitis, unspecified: Secondary | ICD-10-CM

## 2015-06-16 MED ORDER — HYDROCODONE-ACETAMINOPHEN 10-325 MG PO TABS: 1.0000 | ORAL_TABLET | Freq: Four times a day (QID) | ORAL | Status: DC | PRN

## 2015-06-16 NOTE — Telephone Encounter (Signed)
Pt needs refill HYDROcodone-acetaminophen (NORCO) 10-325 MG tablet   Please call when ready.  Thanks Con Memos

## 2015-06-30 ENCOUNTER — Other Ambulatory Visit: Payer: Self-pay | Admitting: Family Medicine

## 2015-06-30 DIAGNOSIS — J0100 Acute maxillary sinusitis, unspecified: Secondary | ICD-10-CM

## 2015-06-30 MED ORDER — HYDROCODONE-ACETAMINOPHEN 10-325 MG PO TABS: 1.0000 | ORAL_TABLET | Freq: Four times a day (QID) | ORAL | Status: DC | PRN

## 2015-06-30 NOTE — Telephone Encounter (Signed)
Pt needs refill on his HYDROcodone-acetaminophen (NORCO) 10-325 MG tablet   Please call when ready.  Thanks, [employer]

## 2015-07-14 ENCOUNTER — Other Ambulatory Visit: Payer: Self-pay | Admitting: Family Medicine

## 2015-07-14 DIAGNOSIS — F419 Anxiety disorder, unspecified: Secondary | ICD-10-CM

## 2015-07-14 DIAGNOSIS — J0100 Acute maxillary sinusitis, unspecified: Secondary | ICD-10-CM

## 2015-07-14 MED ORDER — CLONAZEPAM 0.5 MG PO TABS: 0.5000 mg | ORAL_TABLET | Freq: Two times a day (BID) | ORAL | Status: DC | PRN

## 2015-07-14 MED ORDER — HYDROCODONE-ACETAMINOPHEN 10-325 MG PO TABS: 1.0000 | ORAL_TABLET | Freq: Four times a day (QID) | ORAL | Status: DC | PRN

## 2015-07-14 NOTE — Telephone Encounter (Signed)
HYDROcodone-acetaminophen (NORCO) 10-325 MG tablet and clonazePAM (KLONOPIN) 0.5 MG tablet pt requesting refill of medications

## 2015-07-28 ENCOUNTER — Other Ambulatory Visit: Payer: Self-pay | Admitting: Family Medicine

## 2015-07-28 DIAGNOSIS — J0100 Acute maxillary sinusitis, unspecified: Secondary | ICD-10-CM

## 2015-07-28 MED ORDER — HYDROCODONE-ACETAMINOPHEN 10-325 MG PO TABS: 1.0000 | ORAL_TABLET | Freq: Four times a day (QID) | ORAL | Status: DC | PRN

## 2015-07-28 NOTE — Telephone Encounter (Signed)
Pt requesting refill of HYDROcodone-acetaminophen (NORCO) 10-325 MG tablet

## 2015-08-12 ENCOUNTER — Other Ambulatory Visit: Payer: Self-pay | Admitting: Family Medicine

## 2015-08-12 DIAGNOSIS — J0100 Acute maxillary sinusitis, unspecified: Secondary | ICD-10-CM

## 2015-08-12 MED ORDER — HYDROCODONE-ACETAMINOPHEN 10-325 MG PO TABS: 1.0000 | ORAL_TABLET | Freq: Four times a day (QID) | ORAL | 0 refills | Status: DC | PRN

## 2015-08-12 NOTE — Telephone Encounter (Signed)
Pt contacted office for refill request on the following medications:  HYDROcodone-acetaminophen (NORCO) 10-325 MG tablet,  CB#[phone]/MW

## 2015-08-13 ENCOUNTER — Ambulatory Visit (INDEPENDENT_AMBULATORY_CARE_PROVIDER_SITE_OTHER): Payer: 59 | Admitting: Family Medicine

## 2015-08-13 ENCOUNTER — Encounter: Payer: Self-pay | Admitting: Family Medicine

## 2015-08-13 VITALS — BP 110/88 | HR 78 | Temp 98.4°F | Resp 16 | Wt 183.0 lb

## 2015-08-13 DIAGNOSIS — J320 Chronic maxillary sinusitis: Secondary | ICD-10-CM | POA: Diagnosis not present

## 2015-08-13 DIAGNOSIS — R229 Localized swelling, mass and lump, unspecified: Secondary | ICD-10-CM

## 2015-08-13 DIAGNOSIS — J300 Vasomotor rhinitis: Secondary | ICD-10-CM | POA: Diagnosis not present

## 2015-08-13 DIAGNOSIS — R1909 Other intra-abdominal and pelvic swelling, mass and lump: Secondary | ICD-10-CM

## 2015-08-13 MED ORDER — AMOXICILLIN 500 MG PO CAPS: 1000.0000 mg | ORAL_CAPSULE | Freq: Two times a day (BID) | ORAL | 0 refills | Status: AC

## 2015-08-13 NOTE — Progress Notes (Signed)
Patient: Gary Carter Male    DOB: 10/03/1950   65 y.o.   MRN: [id number] Visit Date: 08/13/2015  Today's Provider: Lelon Huh, MD   Chief Complaint  Patient presents with  . Groin Pain  . Sinusitis   Subjective:    Sinusitis  This is a new problem. The current episode started yesterday. The problem has been gradually worsening since onset. There has been no fever. The pain is mild. Associated symptoms include congestion, coughing, headaches, sinus pressure and a sore throat. Pertinent negatives include no chills, diaphoresis, ear pain, hoarse voice, neck pain, shortness of breath, sneezing or swollen glands. Past treatments include nothing.   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   Patient was at work Sunday 08/10/15 at [employer] and pulled muscle in groin. Patient said that he lifted something and felt a pull in his groin on the right side. Patient stated that a knot came up at the time but has gone down considerably in the last day. Patient stated that he is having some pain in that area.    No Known Allergies Current Meds  Medication Sig  . amLODipine (NORVASC) 10 MG tablet TAKE 1 TABLET BY MOUTH EVERY DAY  . aspirin 325 MG tablet    . citalopram (CELEXA) 20 MG tablet TAKE 1 TABLET BY MOUTH EVERY DAY  . clonazePAM (KLONOPIN) 0.5 MG tablet Take 1 tablet (0.5 mg total) by mouth 2 (two) times daily as needed for anxiety.  . cloNIDine (CATAPRES) 0.1 MG tablet TAKE 1 TABLET BY MOUTH TWICE A DAY  . cyclobenzaprine (FLEXERIL) 5 MG tablet Take 1 tablet (5 mg total) by mouth 3 (three) times daily as needed for muscle spasms.  Marland Kitchen HYDROcodone-acetaminophen (NORCO) 10-325 MG tablet Take 1 tablet by mouth every 6 (six) hours as needed.  Marland Kitchen omeprazole (PRILOSEC) 40 MG capsule TAKE ONE CAPSULE BY MOUTH EVERY DAY  . predniSONE (DELTASONE) 10 MG tablet 6 po for 2 days and then 5 po  for 2 days and then 4 po for 2 days and 3 po for 2 days and then 2 po for 2 days and then 1 po for 2 days.  . sucralfate (CARAFATE) 1 G tablet Take 1 tablet (1 g total) by mouth 4 (four) times daily -  with meals and at bedtime.  . valsartan-hydrochlorothiazide (DIOVAN-HCT) 320-25 MG tablet TAKE 1 TABLET BY MOUTH EVERY DAY AT BEDTIME  . varenicline (CHANTIX) 1 MG tablet Take by mouth. Reported on 04/22/2015  . vitamin E 400 UNIT capsule Take by mouth.    Review of Systems  Constitutional: Negative for appetite change, chills, diaphoresis and fever.  HENT: Positive for congestion, postnasal drip, sinus pressure and sore throat. Negative for ear pain, hoarse voice and sneezing.   Respiratory: Positive for cough. Negative for chest tightness, shortness of breath and wheezing.   Cardiovascular: Negative for chest pain and palpitations.  Gastrointestinal: Negative for abdominal pain, nausea and vomiting.  Musculoskeletal: Positive for arthralgias. Negative for neck pain.  Neurological: Positive for headaches.    Social History  Substance Use Topics  . Smoking status: Current Some Day Smoker    Packs/day: 1.00    Years: 5.00    Types: Cigarettes  . Smokeless tobacco: Not on file  . Alcohol use 0.0 oz/week     Comment: drinks wine every night before bed  Objective:   BP 110/88 (BP Location: Left Arm, Patient Position: Sitting, Cuff Size: Normal)   Pulse 78   Temp 98.4 F (36.9 C) (Oral)   Resp 16   Wt 183 lb (83 kg)   SpO2 97%   BMI 27.83 kg/m   Physical Exam  General Appearance:    Alert, cooperative, no distress  HENT:   bilateral TM normal without fluid or infection, neck without nodes, throat normal without erythema or exudate, bilateral maxillary sinus tender and nasal mucosa pale and congested  Eyes:    PERRL, conjunctiva/corneas clear, EOM's intact       Lungs:     Clear to auscultation bilaterally, respirations unlabored  Heart:    Regular rate and rhythm  Neurologic:    Awake, alert, oriented x 3. No apparent focal neurological           defect.   Skin:   Pea sized firm tender nodule in left right inguinal area which patient states is much smaller that it was yesterday. Is c/w subcutaneous cyst or lymph node.       Assessment & Plan:     1. Maxillary sinusitis, unspecified chronicity  - amoxicillin (AMOXIL) 500 MG capsule; Take 2 capsules (1,000 mg total) by mouth 2 (two) times daily.  Dispense: 40 capsule; Refill: 0  2. Inguinal nodule Inguinal node verse sebaceous cyst.  Return to recheck nodule in groin if it has not gone completely away within 2 weeks  3. Vasomotor rhinitis Try OTC cetirizine (Zyrtec) for persistent sinus drainage        Lelon Huh, MD  [facility]

## 2015-08-13 NOTE — Patient Instructions (Addendum)
Try OTC cetirizine (Zyrtec) for persistent sinus drainage  Return to recheck nodule in groin if it has not gone completely away within 2 weeks

## 2015-08-25 ENCOUNTER — Other Ambulatory Visit: Payer: Self-pay | Admitting: Family Medicine

## 2015-08-25 DIAGNOSIS — J0100 Acute maxillary sinusitis, unspecified: Secondary | ICD-10-CM

## 2015-08-25 MED ORDER — HYDROCODONE-ACETAMINOPHEN 10-325 MG PO TABS: 1.0000 | ORAL_TABLET | Freq: Four times a day (QID) | ORAL | 0 refills | Status: DC | PRN

## 2015-08-25 NOTE — Telephone Encounter (Signed)
Pt needs refill on his hyydrocodone.  Please call when ready.  Thanks Con Memos

## 2015-09-06 ENCOUNTER — Other Ambulatory Visit: Payer: Self-pay | Admitting: Family Medicine

## 2015-09-08 ENCOUNTER — Other Ambulatory Visit: Payer: Self-pay | Admitting: Family Medicine

## 2015-09-08 DIAGNOSIS — J0100 Acute maxillary sinusitis, unspecified: Secondary | ICD-10-CM

## 2015-09-08 MED ORDER — HYDROCODONE-ACETAMINOPHEN 10-325 MG PO TABS: 1.0000 | ORAL_TABLET | Freq: Four times a day (QID) | ORAL | 0 refills | Status: DC | PRN

## 2015-09-08 NOTE — Telephone Encounter (Signed)
Pt needs refill on his hydrocodone /  Please call when ready for pick up.  Thanks, [employer]

## 2015-09-09 ENCOUNTER — Ambulatory Visit: Payer: 59 | Admitting: Family Medicine

## 2015-09-22 ENCOUNTER — Encounter: Payer: Self-pay | Admitting: Family Medicine

## 2015-09-22 ENCOUNTER — Ambulatory Visit (INDEPENDENT_AMBULATORY_CARE_PROVIDER_SITE_OTHER): Payer: 59 | Admitting: Family Medicine

## 2015-09-22 VITALS — BP 160/104 | HR 60 | Temp 97.6°F | Resp 16 | Ht 68.5 in | Wt 181.4 lb

## 2015-09-22 DIAGNOSIS — J0100 Acute maxillary sinusitis, unspecified: Secondary | ICD-10-CM

## 2015-09-22 DIAGNOSIS — N410 Acute prostatitis: Secondary | ICD-10-CM

## 2015-09-22 DIAGNOSIS — M2550 Pain in unspecified joint: Secondary | ICD-10-CM | POA: Diagnosis not present

## 2015-09-22 MED ORDER — HYDROCODONE-ACETAMINOPHEN 10-325 MG PO TABS: 1.0000 | ORAL_TABLET | Freq: Four times a day (QID) | ORAL | 0 refills | Status: DC | PRN

## 2015-09-22 NOTE — Progress Notes (Signed)
Patient: Gary Carter Male    DOB: [birth year]   65 y.o.   MRN: [id number] Visit Date: 09/22/2015  Today's Provider: Lelon Huh, MD   Chief Complaint  Patient presents with  . Fatigue   Subjective:    HPI Joint pain/fatigue: Patient c/o fatigue, joint pain and head aches all over body for about 1 month. Patient reports symptoms are worse in the mornings. Patient denies fever, cough, or runny nose. Patient report he started taking Tylenol arthritis for pain reports mild relief. Patient is requesting labs due to symptoms. Patient concerned that he might have caught contaminated while working at [employer].  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 Has had some chills, mostly at work. Has had some trouble with diarrhea in the morning over the same period of time. He also reports some trouble with urinary hesitancy for several weeks and feels some discomfort in suprapubic area.   Wt Readings from Last 3 Encounters:  09/22/15 181 lb 6.4 oz (82.3 kg)  08/13/15 183 lb (83 kg)  04/22/15 182 lb (82.6 kg)     Follow up for back pain  The patient was last seen for this 5 months ago. Changes made at last visit include no changes.  He reports excellent compliance with treatment. He feels that condition is Unchanged. He is not having side effects.  Patient is requesting refill on hydrocodone-Acetaminophen   He is having some difficulty with urinary hesitancy and mild lower abdominal discomfort. He is concerned about cancer. Had did have PSA of 2.6 earlier this year.    ------------------------------------------------------------------------------------    No Known Allergies   Current Outpatient Prescriptions:  .  acetaminophen (TYLENOL ARTHRITIS PAIN) 650 MG CR tablet, Take 650 mg by mouth every 8 (eight) hours as needed for pain., Disp: , Rfl:  .  amLODipine (NORVASC) 10 MG tablet, TAKE 1  TABLET BY MOUTH EVERY DAY, Disp: 30 tablet, Rfl: 12 .  aspirin 325 MG tablet, Take 325 mg by mouth once. , Disp: , Rfl:  .  citalopram (CELEXA) 20 MG tablet, TAKE 1 TABLET BY MOUTH EVERY DAY, Disp: 60 tablet, Rfl: 6 .  clonazePAM (KLONOPIN) 0.5 MG tablet, Take 1 tablet (0.5 mg total) by mouth 2 (two) times daily as needed for anxiety., Disp: 30 tablet, Rfl: 4 .  cloNIDine (CATAPRES) 0.1 MG tablet, TAKE 1 TABLET BY MOUTH TWICE A DAY, Disp: 60 tablet, Rfl: 5 .  HYDROcodone-acetaminophen (NORCO) 10-325 MG tablet, Take 1 tablet by mouth every 6 (six) hours as needed., Disp: 60 tablet, Rfl: 0 .  omeprazole (PRILOSEC) 40 MG capsule, TAKE ONE CAPSULE BY MOUTH EVERY DAY, Disp: 30 capsule, Rfl: 12 .  sucralfate (CARAFATE) 1 G tablet, Take 1 tablet (1 g total) by mouth 4 (four) times daily -  with meals and at bedtime. (Patient taking differently: Take 1 g by mouth 4 (four) times daily -  with meals and at bedtime. Pt taking as needed), Disp: 120 tablet, Rfl: 1 .  valsartan-hydrochlorothiazide (DIOVAN-HCT) 320-25 MG tablet, TAKE 1 TABLET BY MOUTH EVERY DAY AT BEDTIME, Disp: 30 tablet, Rfl: 11 .  vitamin E 400 UNIT capsule, Take 400 Units by mouth daily. , Disp: , Rfl:   Review of Systems  Constitutional: Positive for fatigue.  HENT: Negative.   Respiratory: Negative.   Cardiovascular: Negative.   Endocrine: Negative.   Musculoskeletal: Positive for joint swelling (pain).  Neurological: Positive for numbness.    Social History  Substance Use Topics  . Smoking status: Current Some Day Smoker    Packs/day: 1.00    Years: 5.00    Types: Cigarettes  . Smokeless tobacco: Never Used  . Alcohol use 0.0 oz/week     Comment: drinks wine every night before bed   Objective:   BP (!) 160/104 (BP Location: Left Arm, Patient Position: Sitting, Cuff Size: Large)   Pulse 60   Temp 97.6 F (36.4 C) (Oral)   Resp 16   Ht 5' 8.5" (1.74 m)   Wt 181 lb 6.4 oz (82.3 kg)   SpO2 98%   BMI 27.18 kg/m    Physical Exam   General Appearance:    Alert, cooperative, no distress  Eyes:    PERRL, conjunctiva/corneas clear, EOM's intact       Lungs:     Clear to auscultation bilaterally, respirations unlabored  Heart:    Regular rate and rhythm  Neurologic:   Awake, alert, oriented x 3. No apparent focal neurological           defect.   MS:   Mild diffuse muscle tenderness. No joint swelling or tenderness.        Assessment & Plan:     1. Arthralgia  - CBC with Differential/Platelet - Comprehensive metabolic panel - Sed Rate (ESR) - B. Burgdorfi Antibodies - Uric acid - HYDROcodone-acetaminophen (NORCO) 10-325 MG tablet; Take 1 tablet by mouth every 6 (six) hours as needed.  Dispense: 60 tablet; Refill: 0  2. Acute prostatitis (possible diagnosis) Check PSA for stability. Consider course of Cipro - PSA         Lelon Huh, MD  [facility]

## 2015-09-23 LAB — COMPREHENSIVE METABOLIC PANEL
ALT: 27 IU/L (ref 0–44)
AST: 22 IU/L (ref 0–40)
Albumin/Globulin Ratio: 1.9 (ref 1.2–2.2)
Albumin: 4.5 g/dL (ref 3.6–4.8)
Alkaline Phosphatase: 59 IU/L (ref 39–117)
BUN/Creatinine Ratio: 22 (ref 10–24)
BUN: 12 mg/dL (ref 8–27)
Bilirubin Total: 0.6 mg/dL (ref 0.0–1.2)
CO2: 26 mmol/L (ref 18–29)
Calcium: 9.3 mg/dL (ref 8.6–10.2)
Chloride: 100 mmol/L (ref 96–106)
Creatinine, Ser: 0.55 mg/dL — ABNORMAL LOW (ref 0.76–1.27)
GFR calc Af Amer: 128 mL/min/{1.73_m2} (ref >59)
GFR calc non Af Amer: 111 mL/min/{1.73_m2} (ref >59)
Globulin, Total: 2.4 g/dL (ref 1.5–4.5)
Glucose: 97 mg/dL (ref 65–99)
Potassium: 4 mmol/L (ref 3.5–5.2)
Sodium: 139 mmol/L (ref 134–144)
Total Protein: 6.9 g/dL (ref 6.0–8.5)

## 2015-09-23 LAB — CBC WITH DIFFERENTIAL/PLATELET
Basophils Absolute: 0 10*3/uL (ref 0.0–0.2)
Basos: 0 %
EOS (ABSOLUTE): 0.2 10*3/uL (ref 0.0–0.4)
Eos: 3 %
Hematocrit: 42.8 % (ref 37.5–51.0)
Hemoglobin: 14.8 g/dL (ref 12.6–17.7)
Immature Grans (Abs): 0 10*3/uL (ref 0.0–0.1)
Immature Granulocytes: 0 %
Lymphocytes Absolute: 2.8 10*3/uL (ref 0.7–3.1)
Lymphs: 38 %
MCH: 32 pg (ref 26.6–33.0)
MCHC: 34.6 g/dL (ref 31.5–35.7)
MCV: 93 fL (ref 79–97)
Monocytes Absolute: 0.6 10*3/uL (ref 0.1–0.9)
Monocytes: 8 %
Neutrophils Absolute: 3.7 10*3/uL (ref 1.4–7.0)
Neutrophils: 51 %
Platelets: 208 10*3/uL (ref 150–379)
RBC: 4.62 x10E6/uL (ref 4.14–5.80)
RDW: 12.7 % (ref 12.3–15.4)
WBC: 7.4 10*3/uL (ref 3.4–10.8)

## 2015-09-23 LAB — URIC ACID: Uric Acid: 5.3 mg/dL (ref 3.7–8.6)

## 2015-09-23 LAB — B. BURGDORFI ANTIBODIES: Lyme IgG/IgM Ab: <0.91 {ISR} (ref 0.00–0.90)

## 2015-09-23 LAB — SEDIMENTATION RATE: Sed Rate: 2 mm/h (ref 0–30)

## 2015-09-23 LAB — PSA: Prostate Specific Ag, Serum: 1.3 ng/mL (ref 0.0–4.0)

## 2015-09-24 ENCOUNTER — Telehealth: Payer: Self-pay

## 2015-09-24 MED ORDER — MELOXICAM 15 MG PO TABS: 15.0000 mg | ORAL_TABLET | Freq: Every day | ORAL | 0 refills | Status: DC

## 2015-09-24 NOTE — Telephone Encounter (Signed)
LMTCB

## 2015-09-24 NOTE — Telephone Encounter (Signed)
-----   Message from Birdie Sons, MD sent at 09/23/2015  8:52 PM EDT ----- Labs are all completely normal. Joint pains are probably from working in the cold. Recommend he start meloxicam 15mg  once a day for joint pain and inflammation.

## 2015-09-24 NOTE — Telephone Encounter (Signed)
Patient advised as below. Patient verbalizes understanding and is in agreement with treatment plan.  

## 2015-10-01 ENCOUNTER — Other Ambulatory Visit: Payer: Self-pay | Admitting: Family Medicine

## 2015-10-01 DIAGNOSIS — J0100 Acute maxillary sinusitis, unspecified: Secondary | ICD-10-CM

## 2015-10-01 MED ORDER — HYDROCODONE-ACETAMINOPHEN 10-325 MG PO TABS: 1.0000 | ORAL_TABLET | Freq: Four times a day (QID) | ORAL | 0 refills | Status: DC | PRN

## 2015-10-03 ENCOUNTER — Other Ambulatory Visit: Payer: Self-pay | Admitting: Family Medicine

## 2015-10-03 DIAGNOSIS — J0100 Acute maxillary sinusitis, unspecified: Secondary | ICD-10-CM

## 2015-10-03 NOTE — Telephone Encounter (Signed)
Pt needs refill on his hydrocodone.    Please call when ready

## 2015-10-03 NOTE — Telephone Encounter (Signed)
Please review-aa 

## 2015-10-06 MED ORDER — HYDROCODONE-ACETAMINOPHEN 10-325 MG PO TABS: 1.0000 | ORAL_TABLET | Freq: Four times a day (QID) | ORAL | 0 refills | Status: DC | PRN

## 2015-10-06 NOTE — Telephone Encounter (Signed)
1rf 

## 2015-10-06 NOTE — Telephone Encounter (Signed)
Done-aa 

## 2015-10-20 ENCOUNTER — Telehealth: Payer: Self-pay | Admitting: Family Medicine

## 2015-10-20 ENCOUNTER — Other Ambulatory Visit: Payer: Self-pay | Admitting: Family Medicine

## 2015-10-20 DIAGNOSIS — J0100 Acute maxillary sinusitis, unspecified: Secondary | ICD-10-CM

## 2015-10-20 MED ORDER — HYDROCODONE-ACETAMINOPHEN 10-325 MG PO TABS: 1.0000 | ORAL_TABLET | Freq: Four times a day (QID) | ORAL | 0 refills | Status: DC | PRN

## 2015-10-20 NOTE — Telephone Encounter (Signed)
Pt needs refil on his hydrocodone.  Please call when ready.  Thanks,teri

## 2015-10-20 NOTE — Telephone Encounter (Signed)
Please review. Thanks!  

## 2015-11-03 ENCOUNTER — Other Ambulatory Visit: Payer: Self-pay | Admitting: Family Medicine

## 2015-11-03 DIAGNOSIS — J0100 Acute maxillary sinusitis, unspecified: Secondary | ICD-10-CM

## 2015-11-03 MED ORDER — HYDROCODONE-ACETAMINOPHEN 10-325 MG PO TABS: 1.0000 | ORAL_TABLET | Freq: Four times a day (QID) | ORAL | 0 refills | Status: DC | PRN

## 2015-11-03 NOTE — Telephone Encounter (Signed)
Pt needs refill on his hydrocodone.  Please call when ready. [phone]  Thank sTeri

## 2015-11-11 ENCOUNTER — Other Ambulatory Visit: Payer: Self-pay | Admitting: Family Medicine

## 2015-11-17 ENCOUNTER — Telehealth: Payer: Self-pay | Admitting: Family Medicine

## 2015-11-17 DIAGNOSIS — J0100 Acute maxillary sinusitis, unspecified: Secondary | ICD-10-CM

## 2015-11-17 MED ORDER — HYDROCODONE-ACETAMINOPHEN 10-325 MG PO TABS: 1.0000 | ORAL_TABLET | Freq: Four times a day (QID) | ORAL | 0 refills | Status: DC | PRN

## 2015-11-17 NOTE — Telephone Encounter (Signed)
Last filled 11/03/15, please review. KW

## 2015-11-17 NOTE — Telephone Encounter (Signed)
Pt needs refill on his hydrocodone.  Please call when ready for pick up  Thanks Con Memos

## 2015-12-01 ENCOUNTER — Other Ambulatory Visit: Payer: Self-pay | Admitting: Family Medicine

## 2015-12-01 DIAGNOSIS — J0100 Acute maxillary sinusitis, unspecified: Secondary | ICD-10-CM

## 2015-12-01 NOTE — Telephone Encounter (Signed)
Pt contacted office for refill request on the following medications: HYDROcodone-acetaminophen (NORCO) 10-325 MG tablet  Last written: 11/17/15 Please advise. Thanks TNP

## 2015-12-02 MED ORDER — HYDROCODONE-ACETAMINOPHEN 10-325 MG PO TABS: 1.0000 | ORAL_TABLET | Freq: Four times a day (QID) | ORAL | 0 refills | Status: DC | PRN

## 2015-12-09 ENCOUNTER — Other Ambulatory Visit: Payer: Self-pay | Admitting: Family Medicine

## 2015-12-09 ENCOUNTER — Telehealth: Payer: Self-pay | Admitting: Family Medicine

## 2015-12-09 DIAGNOSIS — R1310 Dysphagia, unspecified: Secondary | ICD-10-CM

## 2015-12-09 NOTE — Telephone Encounter (Signed)
Pt states that he has had his esophagus stretched by Dr Vira Agar.He is having some discomfort in his throat and would like a referral back to Dr Vira Agar

## 2015-12-09 NOTE — Telephone Encounter (Signed)
Needs referral to dr Tiffany Kocher for dysphagia.

## 2015-12-09 NOTE — Telephone Encounter (Signed)
Please advise 

## 2015-12-15 ENCOUNTER — Other Ambulatory Visit: Payer: Self-pay | Admitting: Family Medicine

## 2015-12-15 DIAGNOSIS — J0100 Acute maxillary sinusitis, unspecified: Secondary | ICD-10-CM

## 2015-12-15 NOTE — Telephone Encounter (Signed)
Pt contacted office for refill request on the following medications: HYDROcodone-acetaminophen (NORCO) 10-325 MG tablet  Last Rx: 12/02/15 Last OV: 09/22/15 Pt stated he will finish the medication that he has tonight and will not have any for tomorrow. Pt would like to get the Rx today. Please advise. Thanks TNP

## 2015-12-15 NOTE — Telephone Encounter (Signed)
Pt called to see if Rx was ready. Please advise. Thanks TNP

## 2015-12-16 ENCOUNTER — Other Ambulatory Visit: Payer: Self-pay | Admitting: Family Medicine

## 2015-12-16 MED ORDER — HYDROCODONE-ACETAMINOPHEN 10-325 MG PO TABS: 1.0000 | ORAL_TABLET | Freq: Four times a day (QID) | ORAL | 0 refills | Status: DC | PRN

## 2015-12-26 ENCOUNTER — Other Ambulatory Visit: Payer: Self-pay | Admitting: Family Medicine

## 2015-12-26 DIAGNOSIS — J0100 Acute maxillary sinusitis, unspecified: Secondary | ICD-10-CM

## 2015-12-26 MED ORDER — HYDROCODONE-ACETAMINOPHEN 10-325 MG PO TABS: 1.0000 | ORAL_TABLET | Freq: Four times a day (QID) | ORAL | 0 refills | Status: DC | PRN

## 2015-12-26 NOTE — Telephone Encounter (Signed)
Pt contacted office for refill request on the following medications: HYDROcodone-acetaminophen ([place]) 10-325 MG tablet Last Rx: 12/16/15 Please advise. Thanks TNP

## 2016-01-13 ENCOUNTER — Telehealth: Payer: Self-pay | Admitting: Family Medicine

## 2016-01-13 ENCOUNTER — Ambulatory Visit (INDEPENDENT_AMBULATORY_CARE_PROVIDER_SITE_OTHER): Payer: 59 | Admitting: Family Medicine

## 2016-01-13 ENCOUNTER — Encounter: Payer: Self-pay | Admitting: Family Medicine

## 2016-01-13 VITALS — BP 152/84 | HR 80 | Temp 98.5°F | Resp 20 | Wt 187.0 lb

## 2016-01-13 DIAGNOSIS — J069 Acute upper respiratory infection, unspecified: Secondary | ICD-10-CM

## 2016-01-13 DIAGNOSIS — B9789 Other viral agents as the cause of diseases classified elsewhere: Secondary | ICD-10-CM | POA: Diagnosis not present

## 2016-01-13 DIAGNOSIS — J0100 Acute maxillary sinusitis, unspecified: Secondary | ICD-10-CM

## 2016-01-13 MED ORDER — HYDROCODONE-HOMATROPINE 5-1.5 MG/5ML PO SYRP: ORAL_SOLUTION | ORAL | 0 refills | Status: DC

## 2016-01-13 NOTE — Patient Instructions (Signed)
Discussed use of saline nasal spray, Allegra, and cough syrup. Expect your sinus symptoms to improve over the course of the week.

## 2016-01-13 NOTE — Telephone Encounter (Signed)
Needs refill on Hydrocodone

## 2016-01-13 NOTE — Progress Notes (Signed)
Subjective:     Patient ID: Gary Carter, male   DOB: Aug 03, 1950, 66 y.o.   MRN: [id number]  HPI  Chief Complaint  Patient presents with  . URI    Patient reports that he has had a cough and congestion X 3 days. Denies fever. He has only bee taking Ibuprofen for the headaches. Patient also mentions that he has alot of PND.   Marland Kitchen Hypertension    Patient also reports that his BP has been running high. He reports that his BP has averaged in the 160s/90s.   Reports transient sore throat with sinus congestion and PND but minimal cough. Has been taking Allegra for his sx. Current smoking is at 0.5 ppd.   Review of Systems     Objective:   Physical Exam  Constitutional: He appears well-developed and well-nourished. No distress.  Ears: T.M's intact without inflammation; partial cerumen obstruction Throat: tonsils absent Neck: no cervical adenopathy Lungs: clear     Assessment:    1. Viral upper respiratory tract infection - HYDROcodone-homatropine (HYCODAN) 5-1.5 MG/5ML syrup; 5 ml 4-6 hours as needed for cough  Dispense: 240 mL; Refill: 0    Plan:    Discussed use of saline nasal spray, Allegra and cough syrup. Suggested nurse bp check when better.Avoid decongestants.

## 2016-01-14 MED ORDER — HYDROCODONE-ACETAMINOPHEN 10-325 MG PO TABS: 1.0000 | ORAL_TABLET | Freq: Four times a day (QID) | ORAL | 0 refills | Status: DC | PRN

## 2016-01-26 ENCOUNTER — Telehealth: Payer: Self-pay | Admitting: Family Medicine

## 2016-01-26 DIAGNOSIS — J0100 Acute maxillary sinusitis, unspecified: Secondary | ICD-10-CM

## 2016-01-26 NOTE — Telephone Encounter (Signed)
Pt contacted office for refill request on the following medications: ° °HYDROcodone-acetaminophen (NORCO) 10-325 MG tablet  ° °CB#[phone]/MW °

## 2016-01-26 NOTE — Telephone Encounter (Signed)
Please review, LOV 01/13/16 acute saw Mikki Santee and LOV with you on 09/22/15, last fill was 01/14/16-aa

## 2016-01-27 MED ORDER — HYDROCODONE-ACETAMINOPHEN 10-325 MG PO TABS: 1.0000 | ORAL_TABLET | Freq: Four times a day (QID) | ORAL | 0 refills | Status: DC | PRN

## 2016-01-27 NOTE — Telephone Encounter (Signed)
Pt called to see if Rx was ready. Please advise. Thanks TNP

## 2016-02-09 ENCOUNTER — Other Ambulatory Visit: Payer: Self-pay | Admitting: Family Medicine

## 2016-02-09 DIAGNOSIS — J0100 Acute maxillary sinusitis, unspecified: Secondary | ICD-10-CM

## 2016-02-09 NOTE — Telephone Encounter (Signed)
Lsat refill 01/27/2016. LOV 09/22/2015. Renaldo Fiddler, CMA

## 2016-02-09 NOTE — Telephone Encounter (Signed)
Pt contacted office for refill request on the following medications: ° °HYDROcodone-acetaminophen (NORCO) 10-325 MG tablet  ° °CB#[phone]/MW °

## 2016-02-10 MED ORDER — HYDROCODONE-ACETAMINOPHEN 10-325 MG PO TABS: 1.0000 | ORAL_TABLET | Freq: Four times a day (QID) | ORAL | 0 refills | Status: DC | PRN

## 2016-02-23 ENCOUNTER — Other Ambulatory Visit: Payer: Self-pay | Admitting: Family Medicine

## 2016-02-23 DIAGNOSIS — J0100 Acute maxillary sinusitis, unspecified: Secondary | ICD-10-CM

## 2016-02-23 MED ORDER — HYDROCODONE-ACETAMINOPHEN 10-325 MG PO TABS: 1.0000 | ORAL_TABLET | Freq: Four times a day (QID) | ORAL | 0 refills | Status: DC | PRN

## 2016-02-23 NOTE — Telephone Encounter (Signed)
Pt contacted office for refill request on the following medications: HYDROcodone-acetaminophen ([place]) 10-325 MG tablet Last Rx: 02/10/16 Please advise. Thanks TNP

## 2016-03-08 ENCOUNTER — Encounter: Payer: Self-pay | Admitting: Family Medicine

## 2016-03-08 ENCOUNTER — Ambulatory Visit (INDEPENDENT_AMBULATORY_CARE_PROVIDER_SITE_OTHER): Payer: 59 | Admitting: Family Medicine

## 2016-03-08 VITALS — BP 136/92 | HR 60 | Temp 97.9°F | Resp 16 | Wt 181.0 lb

## 2016-03-08 DIAGNOSIS — G8929 Other chronic pain: Secondary | ICD-10-CM

## 2016-03-08 DIAGNOSIS — G4700 Insomnia, unspecified: Secondary | ICD-10-CM

## 2016-03-08 DIAGNOSIS — F339 Major depressive disorder, recurrent, unspecified: Secondary | ICD-10-CM

## 2016-03-08 DIAGNOSIS — M5441 Lumbago with sciatica, right side: Secondary | ICD-10-CM | POA: Diagnosis not present

## 2016-03-08 MED ORDER — HYDROCODONE-ACETAMINOPHEN 10-325 MG PO TABS: 1.0000 | ORAL_TABLET | Freq: Four times a day (QID) | ORAL | 0 refills | Status: DC | PRN

## 2016-03-08 MED ORDER — CLONAZEPAM 0.5 MG PO TABS: 0.5000 mg | ORAL_TABLET | Freq: Two times a day (BID) | ORAL | 4 refills | Status: DC | PRN

## 2016-03-08 MED ORDER — BUPROPION HCL ER (XL) 150 MG PO TB24: 150.0000 mg | ORAL_TABLET | Freq: Every day | ORAL | 1 refills | Status: DC

## 2016-03-08 NOTE — Progress Notes (Signed)
Patient: Gary Carter Male    DOB: 1950/10/19   66 y.o.   MRN: [id number] Visit Date: 03/08/2016  Today's Provider: Lelon Huh, MD   Chief Complaint  Patient presents with  . Depression  . Back Pain   Subjective:    Depression         This is a chronic problem.  The problem occurs daily.  The problem has been waxing and waning (patient describes it as "ups and downs") since onset.  Associated symptoms include fatigue, insomnia and sad.  Associated symptoms include no appetite change and no suicidal ideas.  Past treatments include SSRIs - Selective serotonin reuptake inhibitors (citalopram).  Compliance with treatment is good.  Previous treatment provided mild relief.  Past medical history includes chronic pain.     Patient currently takes citalopram 40mg  daily. He thinks that this may need to be changed or adjusted. He is exprerincing symptoms of sadness more often.   Chronic back pain Patient reports that he is due for his refills on pain medications.      No Known Allergies   Current Outpatient Prescriptions:  .  acetaminophen (TYLENOL ARTHRITIS PAIN) 650 MG CR tablet, Take 650 mg by mouth every 8 (eight) hours as needed for pain., Disp: , Rfl:  .  amLODipine (NORVASC) 10 MG tablet, TAKE 1 TABLET BY MOUTH EVERY DAY, Disp: 30 tablet, Rfl: 12 .  aspirin 325 MG tablet, Take 325 mg by mouth once. , Disp: , Rfl:  .  citalopram (CELEXA) 20 MG tablet, TAKE 1 TABLET BY MOUTH EVERY DAY, Disp: 60 tablet, Rfl: 6 .  clonazePAM (KLONOPIN) 0.5 MG tablet, Take 1 tablet (0.5 mg total) by mouth 2 (two) times daily as needed for anxiety., Disp: 30 tablet, Rfl: 4 .  cloNIDine (CATAPRES) 0.1 MG tablet, TAKE 1 TABLET BY MOUTH TWICE A DAY, Disp: 60 tablet, Rfl: 12 .  HYDROcodone-acetaminophen (NORCO) 10-325 MG tablet, Take 1 tablet by mouth every 6 (six) hours as needed., Disp: 60 tablet, Rfl: 0 .  meloxicam (MOBIC) 15 MG tablet, TAKE 1 TABLET (15 MG TOTAL) BY MOUTH DAILY., Disp: 30  tablet, Rfl: 5 .  omeprazole (PRILOSEC) 40 MG capsule, TAKE ONE CAPSULE BY MOUTH EVERY DAY, Disp: 30 capsule, Rfl: 12 .  sucralfate (CARAFATE) 1 G tablet, Take 1 tablet (1 g total) by mouth 4 (four) times daily -  with meals and at bedtime. (Patient taking differently: Take 1 g by mouth 4 (four) times daily -  with meals and at bedtime. Pt taking as needed), Disp: 120 tablet, Rfl: 1 .  tretinoin (RETIN-A) 0.05 % cream, APPLY TO AFFECTED AREA AT BEDTIME, Disp: 45 g, Rfl: 4 .  valsartan-hydrochlorothiazide (DIOVAN-HCT) 320-25 MG tablet, TAKE 1 TABLET BY MOUTH EVERY DAY AT BEDTIME, Disp: 30 tablet, Rfl: 11 .  vitamin E 400 UNIT capsule, Take 400 Units by mouth daily. , Disp: , Rfl:   Review of Systems  Constitutional: Positive for activity change and fatigue. Negative for appetite change, chills, diaphoresis, fever and unexpected weight change.  Cardiovascular: Negative.   Musculoskeletal: Positive for arthralgias and back pain.  Neurological: Negative.   Psychiatric/Behavioral: Positive for agitation and depression. Negative for self-injury and suicidal ideas. The patient is nervous/anxious and has insomnia.     Social History  Substance Use Topics  . Smoking status: Current Some Day Smoker    Packs/day: 1.00    Years: 5.00    Types: Cigarettes  . Smokeless tobacco: Never  Used  . Alcohol use 0.0 oz/week     Comment: drinks wine every night before bed   Objective:   BP (!) 136/92 (BP Location: Right Arm, Patient Position: Sitting, Cuff Size: Normal)   Pulse 60   Temp 97.9 F (36.6 C)   Resp 16   Wt 181 lb (82.1 kg)   BMI 27.12 kg/m   Physical Exam   General Appearance:    Alert, cooperative, no distress  Eyes:    PERRL, conjunctiva/corneas clear, EOM's intact       Lungs:     Clear to auscultation bilaterally, respirations unlabored  Heart:    Regular rate and rhythm  Neurologic:   Awake, alert, oriented x 3. No apparent focal neurological           defect.       Depression  screen PHQ 2/9 03/08/2016  Decreased Interest 2  Down, Depressed, Hopeless 3  PHQ - 2 Score 5  Altered sleeping 3  Tired, decreased energy 1  Change in appetite 1  Feeling bad or failure about yourself  3  Trouble concentrating 2  Suicidal thoughts 3  PHQ-9 Score 18       Assessment & Plan:     1. Chronic bilateral low back pain with right-sided sciatica Refill current dose hydrocodone/apap  - HYDROcodone-acetaminophen (NORCO) 10-325 MG tablet; Take 1 tablet by mouth every 6 (six) hours as needed.  Dispense: 60 tablet; Refill: 0  2. Insomnia, unspecified type Well controlled with prn - clonazePAM (KLONOPIN) 0.5 MG tablet; Take 1 tablet (0.5 mg total) by mouth 2 (two) times daily as needed for anxiety.  Dispense: 30 tablet; Refill: 4  3. Depression, recurrent (HCC) Uncontrolled, Add- buPROPion (WELLBUTRIN XL) 150 MG 24 hr tablet; Take 1 tablet (150 mg total) by mouth daily.  Dispense: 30 tablet; Refill: 1  Return in about 4 weeks (around 04/05/2016).         Lelon Huh, MD  [facility]

## 2016-03-16 ENCOUNTER — Telehealth: Payer: Self-pay

## 2016-03-16 DIAGNOSIS — M5441 Lumbago with sciatica, right side: Principal | ICD-10-CM

## 2016-03-16 DIAGNOSIS — G8929 Other chronic pain: Secondary | ICD-10-CM

## 2016-03-16 NOTE — Telephone Encounter (Signed)
Pt reports his pharmacy recently changes manufacturers of his Norco 10-325 mg. Side effects of this medication since changing manufacturers including depression, insomnia. Pt has already spoke to his pharmacy about this, and they are ordering the old manufacturer's Norco, but he needs new prescription to be written by provider.

## 2016-03-16 NOTE — Telephone Encounter (Signed)
Please review. Thanks!  

## 2016-03-18 MED ORDER — HYDROCODONE-ACETAMINOPHEN 10-325 MG PO TABS: 1.0000 | ORAL_TABLET | Freq: Four times a day (QID) | ORAL | 0 refills | Status: DC | PRN

## 2016-03-18 NOTE — Telephone Encounter (Signed)
Spoke with the pharmacist (Katie at [facility]), and she reports that they did order a supply from the old manufacturer for the patient. However, she reports that he is early for a refill. She reports that he is not due until 03/21/2016. She reports that if you authorize a new Rx to please write it on the prescription when you send it in. Thanks!

## 2016-03-18 NOTE — Telephone Encounter (Signed)
Can you please contact pharmacy to verify this. Thanks.

## 2016-03-18 NOTE — Telephone Encounter (Signed)
Patient is calling again about this. Patient is requesting that another Rx be sent so the pharmacy will give him a new bottle from the old manufacturer. Thanks!

## 2016-03-21 ENCOUNTER — Other Ambulatory Visit: Payer: Self-pay | Admitting: Family Medicine

## 2016-04-01 ENCOUNTER — Other Ambulatory Visit: Payer: Self-pay | Admitting: Family Medicine

## 2016-04-01 DIAGNOSIS — G8929 Other chronic pain: Secondary | ICD-10-CM

## 2016-04-01 DIAGNOSIS — M5441 Lumbago with sciatica, right side: Principal | ICD-10-CM

## 2016-04-01 MED ORDER — HYDROCODONE-ACETAMINOPHEN 10-325 MG PO TABS: 1.0000 | ORAL_TABLET | Freq: Four times a day (QID) | ORAL | 0 refills | Status: DC | PRN

## 2016-04-01 NOTE — Telephone Encounter (Signed)
Pt contacted office for refill request on the following medications:  HYDROcodone-acetaminophen (NORCO) 10-325 MG tablet.  VJ#[phone]/BP

## 2016-04-06 ENCOUNTER — Ambulatory Visit: Payer: 59 | Admitting: Family Medicine

## 2016-04-15 ENCOUNTER — Other Ambulatory Visit: Payer: Self-pay | Admitting: Family Medicine

## 2016-04-15 DIAGNOSIS — M5441 Lumbago with sciatica, right side: Principal | ICD-10-CM

## 2016-04-15 DIAGNOSIS — G8929 Other chronic pain: Secondary | ICD-10-CM

## 2016-04-15 MED ORDER — HYDROCODONE-ACETAMINOPHEN 10-325 MG PO TABS: 1.0000 | ORAL_TABLET | Freq: Four times a day (QID) | ORAL | 0 refills | Status: DC | PRN

## 2016-04-15 NOTE — Telephone Encounter (Signed)
Prescription placed up front for pick up. Patient advised.

## 2016-04-15 NOTE — Telephone Encounter (Signed)
Pt contacted office for refill request on the following medications: ° °HYDROcodone-acetaminophen (NORCO) 10-325 MG tablet  ° °CB#[phone]/MW °

## 2016-04-15 NOTE — Telephone Encounter (Signed)
Hydrocodone APAP refilled

## 2016-04-27 ENCOUNTER — Ambulatory Visit (INDEPENDENT_AMBULATORY_CARE_PROVIDER_SITE_OTHER): Payer: 59 | Admitting: Family Medicine

## 2016-04-27 ENCOUNTER — Encounter: Payer: Self-pay | Admitting: Family Medicine

## 2016-04-27 VITALS — BP 140/100 | HR 83 | Temp 98.5°F | Resp 16 | Wt 187.0 lb

## 2016-04-27 DIAGNOSIS — G8929 Other chronic pain: Secondary | ICD-10-CM

## 2016-04-27 DIAGNOSIS — M5441 Lumbago with sciatica, right side: Secondary | ICD-10-CM

## 2016-04-27 MED ORDER — OXYCODONE-ACETAMINOPHEN 7.5-325 MG PO TABS: 1.0000 | ORAL_TABLET | ORAL | 0 refills | Status: DC | PRN

## 2016-04-27 NOTE — Progress Notes (Signed)
Patient: Gary Carter Male    DOB: 1950/09/10   66 y.o.   MRN: [id number] Visit Date: 04/27/2016  Today's Provider: Lelon Huh, MD   Chief Complaint  Patient presents with  . Hip Pain   Subjective:    Patient stated that his left hip started hurting 2 weeks ago. Patient stated that he was bent over tying his shoe when pain began. Patient stated that he does have radiation down left leg with some numbness. Patient has taken some otc ibuprofen with mild relief.    Hip Pain   The incident occurred more than 1 week ago (2 weeks ago). The incident occurred at home. There was no injury mechanism. The pain is present in the left hip and left leg. The quality of the pain is described as stabbing. The pain is at a severity of 7/10. The pain is moderate. The pain has been fluctuating since onset. Associated symptoms include numbness. He reports no foreign bodies present. The symptoms are aggravated by weight bearing and palpation. He has tried immobilization and NSAIDs for the symptoms. The treatment provided mild relief.      No Known Allergies   Current Outpatient Prescriptions:  .  acetaminophen (TYLENOL ARTHRITIS PAIN) 650 MG CR tablet, Take 650 mg by mouth every 8 (eight) hours as needed for pain., Disp: , Rfl:  .  amLODipine (NORVASC) 10 MG tablet, TAKE 1 TABLET BY MOUTH EVERY DAY, Disp: 30 tablet, Rfl: 12 .  aspirin 325 MG tablet, Take 325 mg by mouth once. , Disp: , Rfl:  .  citalopram (CELEXA) 20 MG tablet, TAKE 1 TABLET BY MOUTH EVERY DAY, Disp: 60 tablet, Rfl: 6 .  clonazePAM (KLONOPIN) 0.5 MG tablet, Take 1 tablet (0.5 mg total) by mouth 2 (two) times daily as needed for anxiety., Disp: 30 tablet, Rfl: 4 .  cloNIDine (CATAPRES) 0.1 MG tablet, TAKE 1 TABLET BY MOUTH TWICE A DAY, Disp: 60 tablet, Rfl: 12 .  HYDROcodone-acetaminophen (NORCO) 10-325 MG tablet, Take 1 tablet by mouth every 6 (six) hours as needed., Disp: 60 tablet, Rfl: 0 .  meloxicam (MOBIC) 15 MG  tablet, TAKE 1 TABLET (15 MG TOTAL) BY MOUTH DAILY., Disp: 30 tablet, Rfl: 5 .  omeprazole (PRILOSEC) 40 MG capsule, TAKE ONE CAPSULE BY MOUTH EVERY DAY, Disp: 30 capsule, Rfl: 12 .  sucralfate (CARAFATE) 1 G tablet, Take 1 tablet (1 g total) by mouth 4 (four) times daily -  with meals and at bedtime. (Patient taking differently: Take 1 g by mouth 4 (four) times daily -  with meals and at bedtime. Pt taking as needed), Disp: 120 tablet, Rfl: 1 .  tretinoin (RETIN-A) 0.05 % cream, APPLY TO AFFECTED AREA AT BEDTIME, Disp: 45 g, Rfl: 4 .  valsartan-hydrochlorothiazide (DIOVAN-HCT) 320-25 MG tablet, TAKE 1 TABLET BY MOUTH EVERY DAY AT BEDTIME, Disp: 30 tablet, Rfl: 11 .  vitamin E 400 UNIT capsule, Take 400 Units by mouth daily. , Disp: , Rfl:  .  buPROPion (WELLBUTRIN XL) 150 MG 24 hr tablet, Take 1 tablet (150 mg total) by mouth daily. (Patient not taking: Reported on 04/27/2016), Disp: 30 tablet, Rfl: 1  Review of Systems  Constitutional: Negative for appetite change, chills and fever.  Respiratory: Negative for chest tightness, shortness of breath and wheezing.   Cardiovascular: Negative for chest pain and palpitations.  Gastrointestinal: Negative for abdominal pain, nausea and vomiting.  Musculoskeletal: Positive for arthralgias, gait problem and myalgias.  Neurological: Positive for  numbness.    Social History  Substance Use Topics  . Smoking status: Current Some Day Smoker    Packs/day: 1.00    Years: 5.00    Types: Cigarettes  . Smokeless tobacco: Never Used  . Alcohol use 0.0 oz/week     Comment: drinks wine every night before bed   Objective:   BP (!) 160/104 (BP Location: Left Arm, Patient Position: Sitting, Cuff Size: Large)   Pulse 83   Temp 98.5 F (36.9 C) (Oral)   Resp 16   Wt 187 lb (84.8 kg)   SpO2 97%   BMI 28.02 kg/m  Vitals:   04/27/16 1545  BP: (!) 160/104  Pulse: 83  Resp: 16  Temp: 98.5 F (36.9 C)  TempSrc: Oral  SpO2: 97%  Weight: 187 lb (84.8 kg)      Physical Exam  General appearance: alert, well developed, well nourished, cooperative and in no distress Head: Normocephalic, without obvious abnormality, atraumatic Respiratory: Respirations even and unlabored, normal respiratory rate Extremities: Tender lumbar spine and left para lumbar muscles.  Skin: Skin color, texture, turgor normal. No rashes seen  Psych: Appropriate mood and affect. Neurologic: Mental status: Alert, oriented to person, place, and time, thought content appropriate.     Assessment & Plan:      1. Chronic bilateral low back pain with right-sided sciatica Discussed PT which he states he has not had since last surgery nearly 10 years ago, but he doesn't think it would help. Discussed referral to pain clinic. Will obtain UTD plain film xrays and try change hydrocodone to oxycodone/apap 7.5/325 # 60 - DG Lumbar Spine Complete; Future    The entirety of the information documented in the History of Present Illness, Review of Systems and Physical Exam were personally obtained by me. Portions of this information were initially documented by Theressa Millard, CMA and reviewed by me for thoroughness and accuracy.   Lelon Huh, MD  [facility]

## 2016-04-27 NOTE — Patient Instructions (Signed)
   Go to the [facility] on [address] for back Xray  

## 2016-04-28 ENCOUNTER — Ambulatory Visit
Admission: RE | Admit: 2016-04-28 | Discharge: 2016-04-28 | Disposition: A | Discharge status: Home / Self Care | Payer: 59 | Source: Ambulatory Visit | Attending: Family Medicine | Admitting: Family Medicine

## 2016-04-28 DIAGNOSIS — M5136 Other intervertebral disc degeneration, lumbar region: Secondary | ICD-10-CM | POA: Diagnosis not present

## 2016-04-28 DIAGNOSIS — M5441 Lumbago with sciatica, right side: Secondary | ICD-10-CM | POA: Diagnosis present

## 2016-04-28 DIAGNOSIS — G8929 Other chronic pain: Secondary | ICD-10-CM | POA: Diagnosis present

## 2016-05-04 ENCOUNTER — Encounter: Payer: Self-pay | Admitting: Family Medicine

## 2016-05-04 ENCOUNTER — Ambulatory Visit (INDEPENDENT_AMBULATORY_CARE_PROVIDER_SITE_OTHER): Payer: 59 | Admitting: Family Medicine

## 2016-05-04 VITALS — BP 140/90 | HR 71 | Temp 98.3°F | Resp 16 | Wt 186.0 lb

## 2016-05-04 DIAGNOSIS — I10 Essential (primary) hypertension: Secondary | ICD-10-CM | POA: Diagnosis not present

## 2016-05-04 DIAGNOSIS — G8929 Other chronic pain: Secondary | ICD-10-CM | POA: Diagnosis not present

## 2016-05-04 DIAGNOSIS — M5441 Lumbago with sciatica, right side: Secondary | ICD-10-CM | POA: Diagnosis not present

## 2016-05-04 DIAGNOSIS — Z87442 Personal history of urinary calculi: Secondary | ICD-10-CM | POA: Insufficient documentation

## 2016-05-04 MED ORDER — OXYCODONE-ACETAMINOPHEN 7.5-325 MG PO TABS: 1.0000 | ORAL_TABLET | Freq: Three times a day (TID) | ORAL | 0 refills | Status: DC | PRN

## 2016-05-04 NOTE — Progress Notes (Signed)
Patient: Gary Carter Male    DOB: 17-Mar-1950   66 y.o.   MRN: [id number] Visit Date: 05/04/2016  Today's Provider: Lelon Huh, MD   Chief Complaint  Patient presents with  . Follow-up  . Hypertension  . Back Pain   Subjective:    HPI   Hypertension, follow-up:  BP Readings from Last 3 Encounters:  05/04/16 140/90  04/27/16 (!) 140/100  03/08/16 (!) 136/92    He was last seen for hypertension 1 weeks ago.  BP at that visit was 140/100. Management since that visit includes; blood pressure was elevated at last ov.He reports good compliance with treatment. He is not having side effects. none He is not exercising. He is adherent to low salt diet.   Outside blood pressures are not checking. He is experiencing none.  Patient denies none.   Cardiovascular risk factors include none.  Use of agents associated with hypertension: none.   ----------------------------------------------------------------  Chronic bilateral low back pain with right-sided sciatica From 04/27/2016-Discussed PT which he states he has not had since last surgery nearly 10 years ago, but he doesn't think it would help. Discussed referral to pain clinic.  X-rays were ordered and changed hydrocodone 10 to oxycodone/apap 7.5/325 and states pain has improved significantly. Is taking one every eight hours.    No Known Allergies   Current Outpatient Prescriptions:  .  acetaminophen (TYLENOL ARTHRITIS PAIN) 650 MG CR tablet, Take 650 mg by mouth every 8 (eight) hours as needed for pain., Disp: , Rfl:  .  amLODipine (NORVASC) 10 MG tablet, TAKE 1 TABLET BY MOUTH EVERY DAY, Disp: 30 tablet, Rfl: 12 .  aspirin 325 MG tablet, Take 325 mg by mouth once. , Disp: , Rfl:  .  citalopram (CELEXA) 20 MG tablet, TAKE 1 TABLET BY MOUTH EVERY DAY, Disp: 60 tablet, Rfl: 6 .  clonazePAM (KLONOPIN) 0.5 MG tablet, Take 1 tablet (0.5 mg total) by mouth 2 (two) times daily as needed for anxiety., Disp: 30 tablet,  Rfl: 4 .  cloNIDine (CATAPRES) 0.1 MG tablet, TAKE 1 TABLET BY MOUTH TWICE A DAY, Disp: 60 tablet, Rfl: 12 .  omeprazole (PRILOSEC) 40 MG capsule, TAKE ONE CAPSULE BY MOUTH EVERY DAY, Disp: 30 capsule, Rfl: 12 .  oxyCODONE-acetaminophen (PERCOCET) 7.5-325 MG tablet, Take 1 tablet by mouth every 4 (four) hours as needed for severe pain., Disp: 30 tablet, Rfl: 0 .  sucralfate (CARAFATE) 1 G tablet, Take 1 tablet (1 g total) by mouth 4 (four) times daily -  with meals and at bedtime. (Patient taking differently: Take 1 g by mouth 4 (four) times daily -  with meals and at bedtime. Pt taking as needed), Disp: 120 tablet, Rfl: 1 .  tretinoin (RETIN-A) 0.05 % cream, APPLY TO AFFECTED AREA AT BEDTIME, Disp: 45 g, Rfl: 4 .  valsartan-hydrochlorothiazide (DIOVAN-HCT) 320-25 MG tablet, TAKE 1 TABLET BY MOUTH EVERY DAY AT BEDTIME, Disp: 30 tablet, Rfl: 11 .  vitamin E 400 UNIT capsule, Take 400 Units by mouth daily. , Disp: , Rfl:   Review of Systems  Constitutional: Negative for appetite change, chills and fever.  Respiratory: Negative for chest tightness, shortness of breath and wheezing.   Cardiovascular: Negative for chest pain and palpitations.  Gastrointestinal: Negative for abdominal pain, nausea and vomiting.  Musculoskeletal: Positive for back pain.    Social History  Substance Use Topics  . Smoking status: Current Some Day Smoker    Packs/day: 1.00  Years: 5.00    Types: Cigarettes  . Smokeless tobacco: Never Used  . Alcohol use 0.0 oz/week     Comment: drinks wine every night before bed   Objective:   BP 140/90 (BP Location: Right Arm, Patient Position: Sitting, Cuff Size: Large)   Pulse 71   Temp 98.3 F (36.8 C) (Oral)   Resp 16   Wt 186 lb (84.4 kg)   SpO2 97%   BMI 27.87 kg/m  Vitals:   05/04/16 1054  BP: 140/90  Pulse: 71  Resp: 16  Temp: 98.3 F (36.8 C)  TempSrc: Oral  SpO2: 97%  Weight: 186 lb (84.4 kg)     Physical Exam  General appearance: alert, well  developed, well nourished, cooperative and in no distress Head: Normocephalic, without obvious abnormality, atraumatic Respiratory: Respirations even and unlabored, normal respiratory rate Extremities: No gross deformities Skin: Skin color, texture, turgor normal. No rashes seen  Psych: Appropriate mood and affect. Neurologic: Mental status: Alert, oriented to person, place, and time, thought content appropriate.     Assessment & Plan:     1. Chronic bilateral low back pain with right-sided sciatica Much better with change from hydrocodone/apap to oxycodone/apap  - oxyCODONE-acetaminophen (PERCOCET) 7.5-325 MG tablet; Take 1 tablet by mouth every 8 (eight) hours as needed for severe pain.  Dispense: 90 tablet; Refill: 0  2. History of kidney stones Possible left renal stone on xr, but asymptomatic at this point.   3. Essential (primary) hypertension Better, Continue current medications.  Return in about 3 months (around 08/03/2016).        Lelon Huh, MD  [facility]

## 2016-05-04 NOTE — Patient Instructions (Signed)

## 2016-05-06 ENCOUNTER — Other Ambulatory Visit: Payer: Self-pay | Admitting: Family Medicine

## 2016-05-06 DIAGNOSIS — F339 Major depressive disorder, recurrent, unspecified: Secondary | ICD-10-CM

## 2016-05-24 ENCOUNTER — Telehealth: Payer: Self-pay | Admitting: Family Medicine

## 2016-05-24 DIAGNOSIS — M5441 Lumbago with sciatica, right side: Principal | ICD-10-CM

## 2016-05-24 DIAGNOSIS — G8929 Other chronic pain: Secondary | ICD-10-CM

## 2016-05-24 NOTE — Telephone Encounter (Signed)
Please advise 

## 2016-05-24 NOTE — Telephone Encounter (Addendum)
Pt states the oxyCODONE-acetaminophen (PERCOCET) 7.5-325 MG tablet is not helping with his pain.  Pt states he can not go every 8 hours with this Rx.  Pt is request to go back on what he was taking before.  Pt states he will pay for this out of his pocket.  CB#[phone]/MW

## 2016-05-25 MED ORDER — HYDROCODONE-ACETAMINOPHEN 10-325 MG PO TABS: 1.0000 | ORAL_TABLET | Freq: Four times a day (QID) | ORAL | 0 refills | Status: DC | PRN

## 2016-05-25 NOTE — Telephone Encounter (Signed)
Patient is calling to check on prescription.

## 2016-06-08 ENCOUNTER — Other Ambulatory Visit: Payer: Self-pay | Admitting: Family Medicine

## 2016-06-08 DIAGNOSIS — M5441 Lumbago with sciatica, right side: Principal | ICD-10-CM

## 2016-06-08 DIAGNOSIS — G8929 Other chronic pain: Secondary | ICD-10-CM

## 2016-06-08 NOTE — Telephone Encounter (Signed)
Pt needs refill on his   HYDROcodone-acetaminophen (NORCO) 10-325 MG tablet  Thanks teri

## 2016-06-09 MED ORDER — HYDROCODONE-ACETAMINOPHEN 10-325 MG PO TABS: 1.0000 | ORAL_TABLET | Freq: Four times a day (QID) | ORAL | 0 refills | Status: DC | PRN

## 2016-06-21 ENCOUNTER — Other Ambulatory Visit: Payer: Self-pay | Admitting: Family Medicine

## 2016-06-21 DIAGNOSIS — G8929 Other chronic pain: Secondary | ICD-10-CM

## 2016-06-21 DIAGNOSIS — M5441 Lumbago with sciatica, right side: Principal | ICD-10-CM

## 2016-06-21 MED ORDER — HYDROCODONE-ACETAMINOPHEN 10-325 MG PO TABS: 1.0000 | ORAL_TABLET | Freq: Four times a day (QID) | ORAL | 0 refills | Status: DC | PRN

## 2016-06-21 NOTE — Telephone Encounter (Signed)
Pt contacted office for refill request on the following medications: ° °HYDROcodone-acetaminophen (NORCO) 10-325 MG tablet  ° °CB#[phone]/MW °

## 2016-07-01 ENCOUNTER — Other Ambulatory Visit: Payer: Self-pay | Admitting: Family Medicine

## 2016-07-01 DIAGNOSIS — M5441 Lumbago with sciatica, right side: Principal | ICD-10-CM

## 2016-07-01 DIAGNOSIS — G8929 Other chronic pain: Secondary | ICD-10-CM

## 2016-07-01 MED ORDER — HYDROCODONE-ACETAMINOPHEN 10-325 MG PO TABS: 1.0000 | ORAL_TABLET | Freq: Four times a day (QID) | ORAL | 0 refills | Status: DC | PRN

## 2016-07-05 ENCOUNTER — Telehealth: Payer: Self-pay

## 2016-07-05 NOTE — Telephone Encounter (Signed)
Rx is ready

## 2016-07-05 NOTE — Telephone Encounter (Signed)
Patient called requesting his hydrocodone to be refilled. He is aware that Dr. Caryn Section is out of the office. Thanks!

## 2016-07-19 ENCOUNTER — Other Ambulatory Visit: Payer: Self-pay | Admitting: Family Medicine

## 2016-07-19 DIAGNOSIS — M5441 Lumbago with sciatica, right side: Principal | ICD-10-CM

## 2016-07-19 DIAGNOSIS — G8929 Other chronic pain: Secondary | ICD-10-CM

## 2016-07-19 MED ORDER — HYDROCODONE-ACETAMINOPHEN 10-325 MG PO TABS: 1.0000 | ORAL_TABLET | Freq: Four times a day (QID) | ORAL | 0 refills | Status: DC | PRN

## 2016-07-19 NOTE — Telephone Encounter (Signed)
Please review. Thanks!  

## 2016-07-19 NOTE — Telephone Encounter (Signed)
Pt needs refill on his HYDROcodone-acetaminophen (NORCO) 10-325 MG tablet  Thanks teri

## 2016-07-20 ENCOUNTER — Other Ambulatory Visit: Payer: Self-pay | Admitting: Family Medicine

## 2016-07-25 ENCOUNTER — Other Ambulatory Visit: Payer: Self-pay | Admitting: Family Medicine

## 2016-07-25 DIAGNOSIS — G4700 Insomnia, unspecified: Secondary | ICD-10-CM

## 2016-07-25 NOTE — Telephone Encounter (Signed)
Please call in clonazepam  

## 2016-07-26 NOTE — Telephone Encounter (Signed)
Rx called in to pharmacy. 

## 2016-08-02 ENCOUNTER — Encounter: Payer: Self-pay | Admitting: Family Medicine

## 2016-08-02 ENCOUNTER — Ambulatory Visit (INDEPENDENT_AMBULATORY_CARE_PROVIDER_SITE_OTHER): Payer: 59 | Admitting: Family Medicine

## 2016-08-02 VITALS — BP 140/94 | HR 82 | Temp 98.1°F | Resp 16 | Ht 69.0 in | Wt 180.0 lb

## 2016-08-02 DIAGNOSIS — F410 Panic disorder [episodic paroxysmal anxiety] without agoraphobia: Secondary | ICD-10-CM | POA: Diagnosis not present

## 2016-08-02 DIAGNOSIS — G8929 Other chronic pain: Secondary | ICD-10-CM | POA: Diagnosis not present

## 2016-08-02 DIAGNOSIS — Z0283 Encounter for blood-alcohol and blood-drug test: Secondary | ICD-10-CM | POA: Diagnosis not present

## 2016-08-02 DIAGNOSIS — I10 Essential (primary) hypertension: Secondary | ICD-10-CM | POA: Diagnosis not present

## 2016-08-02 DIAGNOSIS — F17200 Nicotine dependence, unspecified, uncomplicated: Secondary | ICD-10-CM

## 2016-08-02 DIAGNOSIS — F419 Anxiety disorder, unspecified: Secondary | ICD-10-CM

## 2016-08-02 DIAGNOSIS — M5441 Lumbago with sciatica, right side: Secondary | ICD-10-CM

## 2016-08-02 MED ORDER — HYDROCODONE-ACETAMINOPHEN 10-325 MG PO TABS: 1.0000 | ORAL_TABLET | Freq: Four times a day (QID) | ORAL | 0 refills | Status: DC | PRN

## 2016-08-02 MED ORDER — TELMISARTAN-HCTZ 80-12.5 MG PO TABS: 1.0000 | ORAL_TABLET | Freq: Every day | ORAL | 3 refills | Status: DC

## 2016-08-02 NOTE — Progress Notes (Signed)
Patient: Gary Carter Male    DOB: [birth year]   66 y.o.   MRN: [id number] Visit Date: 08/02/2016  Today's Provider: Lelon Huh, MD   Chief Complaint  Patient presents with  . Follow-up  . Hypertension  . Back Pain  . Anxiety   Subjective:    HPI   Hypertension, follow-up:  BP Readings from Last 3 Encounters:  08/02/16 (!) 140/94  05/04/16 140/90  04/27/16 (!) 140/100    He was last seen for hypertension 3 months ago.  BP at that visit was 140/90. Management since that visit includes; no changes.He reports good compliance with treatment. He is not having side effects. none He is exercising. He is adherent to low salt diet.   Outside blood pressures are not checking. He is experiencing none.  Patient denies none.   Cardiovascular risk factors include none.  Use of agents associated with hypertension: none.   ----------------------------------------------------------------  Chronic bilateral low back pain with right-sided sciatica From 05/04/2016-no changes.  Is taking .hydrocodone/apap mostly on schedule and states it remains effects.   Anxiety Disorder Current treatment clonazepam. And citalopram. He feels this is working well with no adverse effects. He is trying to quit smoking again.   Panic Attack Current treatment citalopram and Bupropion. He feels mood has been good lately and that medications are working well.   No Known Allergies   Current Outpatient Prescriptions:  .  amLODipine (NORVASC) 10 MG tablet, TAKE 1 TABLET BY MOUTH EVERY DAY, Disp: 30 tablet, Rfl: 12 .  aspirin 325 MG tablet, Take 325 mg by mouth once. , Disp: , Rfl:  .  citalopram (CELEXA) 20 MG tablet, TAKE 1 TABLET BY MOUTH EVERY DAY, Disp: 60 tablet, Rfl: 6 .  clonazePAM (KLONOPIN) 0.5 MG tablet, TAKE 1 TABLET BY MOUTH TWICE A DAY AS NEEDED FOR ANXIETY, Disp: 30 tablet, Rfl: 0 .  cloNIDine (CATAPRES) 0.1 MG tablet, TAKE 1 TABLET BY MOUTH TWICE A DAY, Disp: 60 tablet, Rfl:  12 .  HYDROcodone-acetaminophen (NORCO) 10-325 MG tablet, Take 1 tablet by mouth every 6 (six) hours as needed., Disp: 60 tablet, Rfl: 0 .  omeprazole (PRILOSEC) 40 MG capsule, TAKE ONE CAPSULE BY MOUTH EVERY DAY, Disp: 30 capsule, Rfl: 12 .  valsartan-hydrochlorothiazide (DIOVAN-HCT) 320-25 MG tablet, TAKE 1 TABLET BY MOUTH EVERY DAY AT BEDTIME, Disp: 30 tablet, Rfl: 11 .  vitamin E 400 UNIT capsule, Take 400 Units by mouth daily. , Disp: , Rfl:   Review of Systems  Constitutional: Negative for appetite change, chills and fever.  Respiratory: Negative for chest tightness, shortness of breath and wheezing.   Cardiovascular: Negative for chest pain and palpitations.  Gastrointestinal: Negative for abdominal pain, nausea and vomiting.    Social History  Substance Use Topics  . Smoking status: Current Some Day Smoker    Packs/day: 1.00    Years: 5.00    Types: Cigarettes  . Smokeless tobacco: Never Used  . Alcohol use 0.0 oz/week     Comment: drinks wine every night before bed   Objective:   BP (!) 140/94 (BP Location: Right Arm, Patient Position: Sitting, Cuff Size: Normal)   Pulse 82   Temp 98.1 F (36.7 C) (Oral)   Resp 16   Ht 5\' 9"  (1.753 m)   Wt 180 lb (81.6 kg)   SpO2 96%   BMI 26.58 kg/m  Vitals:   08/02/16 0933  BP: (!) 140/94  Pulse: 82  Resp: 16  Temp:  98.1 F (36.7 C)  TempSrc: Oral  SpO2: 96%  Weight: 180 lb (81.6 kg)  Height: 5\' 9"  (1.753 m)     Physical Exam   General Appearance:    Alert, cooperative, no distress  Eyes:    PERRL, conjunctiva/corneas clear, EOM's intact       Lungs:     Clear to auscultation bilaterally, respirations unlabored  Heart:    Regular rate and rhythm  Neurologic:   Awake, alert, oriented x 3. No apparent focal neurological           defect.           Assessment & Plan:     1. Encounter for drug screening  - [facility] (14 Drugs), Ur  2. Chronic bilateral low back pain with right-sided sciatica Well  controlled on current care plan with minimal effect on ADLs.  - HYDROcodone-acetaminophen (NORCO) 10-325 MG tablet; Take 1 tablet by mouth every 6 (six) hours as needed.  Dispense: 60 tablet; Refill: 0  3. Anxiety disorder, unspecified type Well controlled.  Continue current medications.    4. Panic attack Continue current SSRI.   5. Essential (primary) hypertension Change valsartan-hctz to telmesartan-hctz 80-12.5 daily and call if any adverse effects.   6. Current smoker Continue to work on smoking cessation.        Lelon Huh, MD  [facility]

## 2016-08-05 LAB — PAIN MGT SCRN (14 DRUGS), UR
Amphetamine Scrn, Ur: NEGATIVE ng/mL
BARBITURATE SCREEN URINE: NEGATIVE ng/mL
BENZODIAZEPINE SCREEN, URINE: NEGATIVE ng/mL
Buprenorphine, Urine: NEGATIVE ng/mL
CANNABINOIDS UR QL SCN: NEGATIVE ng/mL
Cocaine (Metab) Scrn, Ur: NEGATIVE ng/mL
Creatinine(Crt), U: 85.5 mg/dL (ref 20.0–300.0)
Fentanyl, Urine: NEGATIVE pg/mL
Meperidine Screen, Urine: NEGATIVE ng/mL
Methadone Screen, Urine: NEGATIVE ng/mL
OXYCODONE+OXYMORPHONE UR QL SCN: NEGATIVE ng/mL
Opiate Scrn, Ur: POSITIVE ng/mL — AB
Ph of Urine: 5.8 (ref 4.5–8.9)
Phencyclidine Qn, Ur: NEGATIVE ng/mL
Propoxyphene Scrn, Ur: NEGATIVE ng/mL
Tramadol Screen, Urine: NEGATIVE ng/mL

## 2016-08-16 ENCOUNTER — Encounter: Payer: Self-pay | Admitting: Family Medicine

## 2016-08-16 ENCOUNTER — Ambulatory Visit (INDEPENDENT_AMBULATORY_CARE_PROVIDER_SITE_OTHER): Payer: 59 | Admitting: Family Medicine

## 2016-08-16 VITALS — BP 160/102 | HR 77 | Temp 97.7°F | Resp 16 | Wt 181.0 lb

## 2016-08-16 DIAGNOSIS — F419 Anxiety disorder, unspecified: Secondary | ICD-10-CM | POA: Diagnosis not present

## 2016-08-16 DIAGNOSIS — M5441 Lumbago with sciatica, right side: Secondary | ICD-10-CM | POA: Diagnosis not present

## 2016-08-16 DIAGNOSIS — I10 Essential (primary) hypertension: Secondary | ICD-10-CM

## 2016-08-16 DIAGNOSIS — G8929 Other chronic pain: Secondary | ICD-10-CM | POA: Diagnosis not present

## 2016-08-16 MED ORDER — HYDROCODONE-ACETAMINOPHEN 10-325 MG PO TABS: 1.0000 | ORAL_TABLET | Freq: Four times a day (QID) | ORAL | 0 refills | Status: DC | PRN

## 2016-08-16 NOTE — Progress Notes (Signed)
Patient: Gary Carter Male    DOB: [birth year]   66 y.o.   MRN: [id number] Visit Date: 08/16/2016  Today's Provider: Lelon Huh, MD   Chief Complaint  Patient presents with  . Hypertension   Subjective:    HPI  Hypertension, follow-up:  BP Readings from Last 3 Encounters:  08/02/16 (!) 140/94  05/04/16 140/90  04/27/16 (!) 140/100    He was last seen for hypertension 2 weeks ago.  BP at that visit was 140/94. Management since that visit includes changing Valsartan- HCTZ to Telmesartan-HCTZ 80-12.5mg  daily. He reports good compliance with treatment. He is having side effects. Headaches and shortness of breath He is exercising. He is adherent to low salt diet.   Outside blood pressures are 169/111 (this morning). He is experiencing dyspnea.  Patient denies chest pain, chest pressure/discomfort, claudication, exertional chest pressure/discomfort, irregular heart beat, lower extremity edema, near-syncope, orthopnea, palpitations, paroxysmal nocturnal dyspnea, syncope and tachypnea.   Cardiovascular risk factors include advanced age (older than 23 for men, 50 for women), hypertension, male gender and smoking/ tobacco exposure.  Use of agents associated with hypertension: NSAIDS.     Weight trend: stable Wt Readings from Last 3 Encounters:  08/02/16 180 lb (81.6 kg)  05/04/16 186 lb (84.4 kg)  04/27/16 187 lb (84.8 kg)    Current diet: well balanced  ------------------------------------------------------------------------ He states he has occasional tightness in chest when he is anxious, often concerning his daughter's health. However he never has chest pain or tightness with just physical exertion. He had stress echo 06/21/2014 at [facility] with no evidence of ischemia.     No Known Allergies   Current Outpatient Prescriptions:  .  amLODipine (NORVASC) 10 MG tablet, TAKE 1 TABLET BY MOUTH EVERY DAY, Disp: 30 tablet, Rfl: 12 .  aspirin 325 MG tablet, Take 325 mg by  mouth once. , Disp: , Rfl:  .  citalopram (CELEXA) 20 MG tablet, TAKE 1 TABLET BY MOUTH EVERY DAY, Disp: 60 tablet, Rfl: 6 .  clonazePAM (KLONOPIN) 0.5 MG tablet, TAKE 1 TABLET BY MOUTH TWICE A DAY AS NEEDED FOR ANXIETY, Disp: 30 tablet, Rfl: 0 .  cloNIDine (CATAPRES) 0.1 MG tablet, TAKE 1 TABLET BY MOUTH TWICE A DAY, Disp: 60 tablet, Rfl: 12 .  HYDROcodone-acetaminophen (NORCO) 10-325 MG tablet, Take 1 tablet by mouth every 6 (six) hours as needed., Disp: 60 tablet, Rfl: 0 .  omeprazole (PRILOSEC) 40 MG capsule, TAKE ONE CAPSULE BY MOUTH EVERY DAY, Disp: 30 capsule, Rfl: 12 .  telmisartan-hydrochlorothiazide (MICARDIS HCT) 80-12.5 MG tablet, Take 1 tablet by mouth daily., Disp: 30 tablet, Rfl: 3 .  vitamin E 400 UNIT capsule, Take 400 Units by mouth daily. , Disp: , Rfl:   Review of Systems  Constitutional: Negative for appetite change, chills and fever.  Respiratory: Positive for chest tightness and shortness of breath. Negative for wheezing.   Cardiovascular: Negative for chest pain and palpitations.  Gastrointestinal: Negative for abdominal pain, nausea and vomiting.  Musculoskeletal: Positive for arthralgias and back pain.  Neurological: Positive for tremors and headaches.    Social History  Substance Use Topics  . Smoking status: Current Some Day Smoker    Packs/day: 1.00    Years: 5.00    Types: Cigarettes  . Smokeless tobacco: Never Used     Comment: quit for 30 years  . Alcohol use 0.0 oz/week     Comment: drinks wine every night before bed   Objective:   BP Marland Kitchen)  160/102 (BP Location: Right Arm, Patient Position: Sitting, Cuff Size: Large)   Pulse 77   Temp 97.7 F (36.5 C) (Oral)   Resp 16   Wt 181 lb (82.1 kg)   SpO2 97% Comment: room air  BMI 26.73 kg/m  There were no vitals filed for this visit.   Physical Exam   General Appearance:    Alert, cooperative, no distress  Eyes:    PERRL, conjunctiva/corneas clear, EOM's intact       Lungs:     Clear to  auscultation bilaterally, respirations unlabored  Heart:    Regular rate and rhythm  Neurologic:   Awake, alert, oriented x 3. No apparent focal neurological           defect.           Assessment & Plan:     1. Essential (primary) hypertension Recently change from valsartan-hctz to [employer]. Consider increasing hctz component or adding spironolactone after reviewing labs.  - Comprehensive metabolic panel - CBC - T4 AND TSH  2. Chronic bilateral low back pain with right-sided sciatica Needs refill  - HYDROcodone-acetaminophen (NORCO) 10-325 MG tablet; Take 1 tablet by mouth every 6 (six) hours as needed.  Dispense: 60 tablet; Refill: 0  3. Anxiety disorder, unspecified type Continue current of clonazepam.        Lelon Huh, MD  [facility]

## 2016-08-17 ENCOUNTER — Telehealth: Payer: Self-pay

## 2016-08-17 LAB — COMPREHENSIVE METABOLIC PANEL
ALT: 20 IU/L (ref 0–44)
AST: 17 IU/L (ref 0–40)
Albumin/Globulin Ratio: 1.7 (ref 1.2–2.2)
Albumin: 4.3 g/dL (ref 3.6–4.8)
Alkaline Phosphatase: 53 IU/L (ref 39–117)
BUN/Creatinine Ratio: 25 — ABNORMAL HIGH (ref 10–24)
BUN: 19 mg/dL (ref 8–27)
Bilirubin Total: 0.4 mg/dL (ref 0.0–1.2)
CO2: 27 mmol/L (ref 20–29)
Calcium: 9.4 mg/dL (ref 8.6–10.2)
Chloride: 101 mmol/L (ref 96–106)
Creatinine, Ser: 0.75 mg/dL — ABNORMAL LOW (ref 0.76–1.27)
GFR calc Af Amer: 112 mL/min/{1.73_m2} (ref >59)
GFR calc non Af Amer: 97 mL/min/{1.73_m2} (ref >59)
Globulin, Total: 2.5 g/dL (ref 1.5–4.5)
Glucose: 93 mg/dL (ref 65–99)
Potassium: 4 mmol/L (ref 3.5–5.2)
Sodium: 140 mmol/L (ref 134–144)
Total Protein: 6.8 g/dL (ref 6.0–8.5)

## 2016-08-17 LAB — CBC
Hematocrit: 45.8 % (ref 37.5–51.0)
Hemoglobin: 15.2 g/dL (ref 13.0–17.7)
MCH: 31.6 pg (ref 26.6–33.0)
MCHC: 33.2 g/dL (ref 31.5–35.7)
MCV: 95 fL (ref 79–97)
Platelets: 215 10*3/uL (ref 150–379)
RBC: 4.81 x10E6/uL (ref 4.14–5.80)
RDW: 13.3 % (ref 12.3–15.4)
WBC: 9.1 10*3/uL (ref 3.4–10.8)

## 2016-08-17 LAB — T4 AND TSH
T4, Total: 6.3 ug/dL (ref 4.5–12.0)
TSH: 0.281 u[IU]/mL — ABNORMAL LOW (ref 0.450–4.500)

## 2016-08-17 MED ORDER — HYDROCHLOROTHIAZIDE 25 MG PO TABS: 25.0000 mg | ORAL_TABLET | Freq: Every day | ORAL | 0 refills | Status: DC

## 2016-08-17 NOTE — Telephone Encounter (Signed)
-----   Message from Birdie Sons, MD sent at 08/17/2016  8:55 AM EDT ----- Slightly hyperthyroid which may be contributing to high blood pressure. Add hctz 25mg  once a day, #30, rf x 0 for better blood pressure control. Follow up to check BP and recheck thyroid functions 3 weeks.

## 2016-08-17 NOTE — Telephone Encounter (Signed)
Patient advised as below.  

## 2016-08-17 NOTE — Telephone Encounter (Signed)
Patient advised as below. Patient verbalizes understanding and is in agreement with treatment plan.  

## 2016-08-20 ENCOUNTER — Encounter: Payer: Self-pay | Admitting: Family Medicine

## 2016-08-20 ENCOUNTER — Ambulatory Visit (INDEPENDENT_AMBULATORY_CARE_PROVIDER_SITE_OTHER): Payer: 59 | Admitting: Family Medicine

## 2016-08-20 ENCOUNTER — Telehealth: Payer: Self-pay

## 2016-08-20 VITALS — BP 150/110 | HR 80 | Temp 98.3°F | Resp 16

## 2016-08-20 DIAGNOSIS — F419 Anxiety disorder, unspecified: Secondary | ICD-10-CM

## 2016-08-20 DIAGNOSIS — R0789 Other chest pain: Secondary | ICD-10-CM | POA: Diagnosis not present

## 2016-08-20 DIAGNOSIS — G4700 Insomnia, unspecified: Secondary | ICD-10-CM

## 2016-08-20 DIAGNOSIS — F410 Panic disorder [episodic paroxysmal anxiety] without agoraphobia: Secondary | ICD-10-CM | POA: Diagnosis not present

## 2016-08-20 DIAGNOSIS — I10 Essential (primary) hypertension: Secondary | ICD-10-CM | POA: Diagnosis not present

## 2016-08-20 MED ORDER — CITALOPRAM HYDROBROMIDE 40 MG PO TABS: 40.0000 mg | ORAL_TABLET | Freq: Every day | ORAL | 2 refills | Status: DC

## 2016-08-20 MED ORDER — METOPROLOL SUCCINATE ER 25 MG PO TB24: 25.0000 mg | ORAL_TABLET | Freq: Every day | ORAL | 1 refills | Status: DC

## 2016-08-20 MED ORDER — CLONAZEPAM 0.5 MG PO TABS: ORAL_TABLET | ORAL | 0 refills | Status: DC

## 2016-08-20 NOTE — Progress Notes (Signed)
Patient: Gary Carter Male    DOB: [birth year]   66 y.o.   MRN: [id number] Visit Date: 08/20/2016  Today's Provider: Lelon Huh, MD   Chief Complaint  Patient presents with  . Hypertension   Subjective:    HPI  Patient came into office with elevated blood pressure. Patient has symptoms of lightheadedness, anxiety, and states he feels off. States he has been very anxious about his daughters health lately. States he has vague tightness in chest occasionally when he gets stressed, but not with physical exertion.   No Known Allergies   Current Outpatient Prescriptions:  .  amLODipine (NORVASC) 10 MG tablet, TAKE 1 TABLET BY MOUTH EVERY DAY, Disp: 30 tablet, Rfl: 12 .  aspirin 325 MG tablet, Take 325 mg by mouth once. , Disp: , Rfl:  .  citalopram (CELEXA) 40 MG tablet, Take 1 tablet (40 mg total) by mouth daily., Disp: 30 tablet, Rfl: 2 .  clonazePAM (KLONOPIN) 0.5 MG tablet, TAKE 1 TABLET BY MOUTH EVERY 8 HOURS AS NEEDED FOR ANXIETY, Disp: 90 tablet, Rfl: 0 .  cloNIDine (CATAPRES) 0.1 MG tablet, TAKE 1 TABLET BY MOUTH TWICE A DAY, Disp: 60 tablet, Rfl: 12 .  hydrochlorothiazide (HYDRODIURIL) 25 MG tablet, Take 1 tablet (25 mg total) by mouth daily., Disp: 30 tablet, Rfl: 0 .  HYDROcodone-acetaminophen (NORCO) 10-325 MG tablet, Take 1 tablet by mouth every 6 (six) hours as needed., Disp: 60 tablet, Rfl: 0 .  omeprazole (PRILOSEC) 40 MG capsule, TAKE ONE CAPSULE BY MOUTH EVERY DAY, Disp: 30 capsule, Rfl: 12 .  telmisartan-hydrochlorothiazide (MICARDIS HCT) 80-12.5 MG tablet, Take 1 tablet by mouth daily., Disp: 30 tablet, Rfl: 3 .  vitamin E 400 UNIT capsule, Take 400 Units by mouth daily. , Disp: , Rfl:   Review of Systems  Constitutional: Negative for appetite change, chills and fever.  Respiratory: Negative for chest tightness, shortness of breath and wheezing.   Cardiovascular: Negative for chest pain and palpitations.  Gastrointestinal: Negative for abdominal pain,  nausea and vomiting.  Neurological: Positive for dizziness and light-headedness.  Psychiatric/Behavioral: The patient is nervous/anxious.     Social History  Substance Use Topics  . Smoking status: Current Some Day Smoker    Packs/day: 1.00    Years: 5.00    Types: Cigarettes  . Smokeless tobacco: Never Used     Comment: quit for 30 years  . Alcohol use 0.0 oz/week     Comment: drinks wine every night before bed   Objective:   BP (!) 150/110 (BP Location: Right Arm, Patient Position: Sitting, Cuff Size: Large)   Pulse 80   Temp 98.3 F (36.8 C) (Oral)   Resp 16   SpO2 96%  Vitals:   08/20/16 1414  BP: (!) 150/110  Pulse: 80  Resp: 16  Temp: 98.3 F (36.8 C)  TempSrc: Oral  SpO2: 96%     Physical Exam   General Appearance:    Alert, cooperative, no distress, appears anxious  Eyes:    PERRL, conjunctiva/corneas clear, EOM's intact       Lungs:     Clear to auscultation bilaterally, respirations unlabored  Heart:    Regular rate and rhythm  Neurologic:   Awake, alert, oriented x 3. No apparent focal neurological           defect.           Assessment & Plan:     1. Chest tightness Secondary to anxiety/panic attacks.  Have recently doubled dose citalopram.  - EKG 12-Lead  2. Essential (primary) hypertension Labile secondary to to anxiety. Will add metoprolol XR 25mg  daily and only take clonidine prn SBP >160 or SBP>100.   3. Anxiety disorder, unspecified type   4. Panic attack        Lelon Huh, MD  [facility]

## 2016-08-20 NOTE — Telephone Encounter (Signed)
Can change clonazepam to 0.5mg  every eight hours as needed. #90, rf x 0. Please call in new prescription.  Should also change citalopram to 40mg  once a day. [facility]. If he still has 20mg  tablets he can take 2 a day until gone.

## 2016-08-20 NOTE — Telephone Encounter (Signed)
Patient called stating that he has been under more stress lately because of his daughter and personal family issues. Patient didn't mention this at the last appointment because he felt that it was a private matter that he wasn't comfortable discussing. Patient wants to know if the dosage of Clonazepam could be increased.

## 2016-08-20 NOTE — Telephone Encounter (Signed)
Rx called into pharmacy. Patient was advised.

## 2016-08-20 NOTE — Patient Instructions (Addendum)
   Check your blood pressure twice a day   Only take clonidine if your top blood pressure is over 160 or your bottom blood pressure is over 100.    Take the Micardis and hydrochlorothiazide in the morning. Take the amlodipine and metoprolol in the evening.

## 2016-08-23 ENCOUNTER — Telehealth: Payer: Self-pay | Admitting: Family Medicine

## 2016-08-23 DIAGNOSIS — G8929 Other chronic pain: Secondary | ICD-10-CM

## 2016-08-23 DIAGNOSIS — M5441 Lumbago with sciatica, right side: Principal | ICD-10-CM

## 2016-08-23 NOTE — Telephone Encounter (Signed)
Pt states he rec'd the Rx for HYDROcodone-acetaminophen ([place]) 10-325 MG tablet on 08/17/16 and had it filled at [facility].  Pt states [facility] so [facility] took [facility] Rx to [facility].  Pt states the medication it is not the same as what [facility] gets at [facility]. It is not helping with his pain.  Pt is asking he can get a new Rx for HYDROcodone-acetaminophen (NORCO) 10-325 MG tablet to take to [facility].  Pt states he can either bring the Rx from [facility] to [place] or he can take it back to [facility].  CB#[phone]/MW

## 2016-08-23 NOTE — Telephone Encounter (Signed)
Please review. Thanks!  

## 2016-08-27 MED ORDER — HYDROCODONE-ACETAMINOPHEN 10-325 MG PO TABS: 1.0000 | ORAL_TABLET | Freq: Four times a day (QID) | ORAL | 0 refills | Status: DC | PRN

## 2016-08-27 NOTE — Telephone Encounter (Signed)
[  please review-aa 

## 2016-08-27 NOTE — Telephone Encounter (Signed)
Pt is calling back for a refill on his pain medication...   Thanks [employer]

## 2016-09-06 ENCOUNTER — Ambulatory Visit: Payer: Self-pay | Admitting: Family Medicine

## 2016-09-10 ENCOUNTER — Other Ambulatory Visit: Payer: Self-pay | Admitting: Family Medicine

## 2016-09-10 DIAGNOSIS — G8929 Other chronic pain: Secondary | ICD-10-CM

## 2016-09-10 DIAGNOSIS — M5441 Lumbago with sciatica, right side: Principal | ICD-10-CM

## 2016-09-10 NOTE — Telephone Encounter (Signed)
Pt needs refill on his hydrocodone.   Thanks [employer]

## 2016-09-14 ENCOUNTER — Encounter: Payer: Self-pay | Admitting: Family Medicine

## 2016-09-14 ENCOUNTER — Ambulatory Visit (INDEPENDENT_AMBULATORY_CARE_PROVIDER_SITE_OTHER): Payer: 59 | Admitting: Family Medicine

## 2016-09-14 VITALS — BP 142/94 | HR 72 | Temp 97.9°F | Resp 16 | Wt 183.0 lb

## 2016-09-14 DIAGNOSIS — Z23 Encounter for immunization: Secondary | ICD-10-CM

## 2016-09-14 DIAGNOSIS — I10 Essential (primary) hypertension: Secondary | ICD-10-CM

## 2016-09-14 MED ORDER — HYDROCODONE-ACETAMINOPHEN 10-325 MG PO TABS: 1.0000 | ORAL_TABLET | Freq: Four times a day (QID) | ORAL | 0 refills | Status: DC | PRN

## 2016-09-14 MED ORDER — TELMISARTAN-HCTZ 80-25 MG PO TABS: 1.0000 | ORAL_TABLET | Freq: Every day | ORAL | 5 refills | Status: DC

## 2016-09-14 MED ORDER — METOPROLOL SUCCINATE ER 50 MG PO TB24: 50.0000 mg | ORAL_TABLET | Freq: Every day | ORAL | 5 refills | Status: DC

## 2016-09-14 NOTE — Patient Instructions (Signed)
   The CDC recommends two doses of Shingrix (the shingles vaccine) separated by 2 to 6 months for adults age 64 years and older. I recommend checking with your insurance plan regarding coverage for this vaccine.   

## 2016-09-14 NOTE — Progress Notes (Signed)
Patient: Gary Carter Male    DOB: 08-11-1950   66 y.o.   MRN: [id number] Visit Date: 09/14/2016  Today's Provider: Lelon Huh, MD   Chief Complaint  Patient presents with  . Hypertension   Subjective:    HPI  Hypertension, follow-up:  BP Readings from Last 3 Encounters:  08/20/16 (!) 150/110  08/16/16 (!) 160/102  08/02/16 (!) 140/94    He was last seen for hypertension 4 weeks ago.  BP at that visit was 150/110. Management since that visit includes adding Metoprolol XR 25mg  daily. Patient was advised to take Clonidine as needed if blood pressure was above 160/100.Marland Kitchen He reports good compliance with treatment. He is having side effects (Tremors since starting Metoprolol. He is exercising. He is adherent to low salt diet.   Outside blood pressures are 138/67. He is experiencing none.  Patient denies chest pain, chest pressure/discomfort, claudication, dyspnea, exertional chest pressure/discomfort, fatigue, irregular heart beat, lower extremity edema, near-syncope, orthopnea, palpitations, paroxysmal nocturnal dyspnea, syncope and tachypnea.   Cardiovascular risk factors include advanced age (older than 67 for men, 19 for women), hypertension and male gender.  Use of agents associated with hypertension: NSAIDS.     Weight trend: stable Wt Readings from Last 3 Encounters:  09/14/16 183 lb (83 kg)  08/16/16 181 lb (82.1 kg)  08/02/16 180 lb (81.6 kg)    Current diet: well balanced  ------------------------------------------------------------------------     No Known Allergies   Current Outpatient Prescriptions:  .  amLODipine (NORVASC) 10 MG tablet, TAKE 1 TABLET BY MOUTH EVERY DAY, Disp: 30 tablet, Rfl: 12 .  aspirin 325 MG tablet, Take 325 mg by mouth once. , Disp: , Rfl:  .  citalopram (CELEXA) 40 MG tablet, Take 1 tablet (40 mg total) by mouth daily., Disp: 30 tablet, Rfl: 2 .  clonazePAM (KLONOPIN) 0.5 MG tablet, TAKE 1 TABLET BY MOUTH EVERY 8  HOURS AS NEEDED FOR ANXIETY, Disp: 90 tablet, Rfl: 0 .  cloNIDine (CATAPRES) 0.1 MG tablet, TAKE 1 TABLET BY MOUTH TWICE A DAY, Disp: 60 tablet, Rfl: 12 .  hydrochlorothiazide (HYDRODIURIL) 25 MG tablet, Take 1 tablet (25 mg total) by mouth daily., Disp: 30 tablet, Rfl: 0 .  HYDROcodone-acetaminophen (NORCO) 10-325 MG tablet, Take 1 tablet by mouth every 6 (six) hours as needed., Disp: 60 tablet, Rfl: 0 .  metoprolol succinate (TOPROL-XL) 25 MG 24 hr tablet, Take 1 tablet (25 mg total) by mouth daily. In the evening, Disp: 30 tablet, Rfl: 1 .  omeprazole (PRILOSEC) 40 MG capsule, TAKE ONE CAPSULE BY MOUTH EVERY DAY, Disp: 30 capsule, Rfl: 12 .  telmisartan-hydrochlorothiazide (MICARDIS HCT) 80-12.5 MG tablet, Take 1 tablet by mouth daily., Disp: 30 tablet, Rfl: 3 .  vitamin E 400 UNIT capsule, Take 400 Units by mouth daily. , Disp: , Rfl:   Review of Systems  Constitutional: Negative for appetite change, chills and fever.  Respiratory: Negative for chest tightness, shortness of breath and wheezing.   Cardiovascular: Negative for chest pain and palpitations.  Gastrointestinal: Negative for abdominal pain, nausea and vomiting.  Neurological: Positive for tremors.    Social History  Substance Use Topics  . Smoking status: Current Some Day Smoker    Packs/day: 0.50    Years: 5.00    Types: Cigarettes  . Smokeless tobacco: Never Used     Comment: quit for 30 years  . Alcohol use 0.0 oz/week     Comment: drinks wine every night before bed  Objective:   BP (!) 142/94 (BP Location: Left Arm, Patient Position: Sitting, Cuff Size: Large)   Pulse 72   Temp 97.9 F (36.6 C) (Oral)   Resp 16   Wt 183 lb (83 kg)   SpO2 96% Comment: room air  BMI 27.02 kg/m  Vitals:   09/14/16 0804  Resp: 16  Weight: 183 lb (83 kg)     Physical Exam   General Appearance:    Alert, cooperative, no distress  Eyes:    PERRL, conjunctiva/corneas clear, EOM's intact       Lungs:     Clear to  auscultation bilaterally, respirations unlabored  Heart:    Regular rate and rhythm  Neurologic:   Awake, alert, oriented x 3. No apparent focal neurological           defect.           Assessment & Plan:     1. Essential (primary) hypertension Improved with increase hctz and less labile with addition of metoprolol ER. Change dose of  - telmisartan-hydrochlorothiazide (MICARDIS HCT) 80-25 MG tablet; Take 1 tablet by mouth daily.  Dispense: 30 tablet; Refill: 5  Increase from 25 metoprolol to  - metoprolol succinate (TOPROL-XL) 50 MG 24 hr tablet; Take 1 tablet (50 mg total) by mouth daily. In the evening  Dispense: 30 tablet; Refill: 5  2. Need for influenza vaccination  - Flu Vaccine QUAD 6+ mos PF IM (Fluarix Quad PF)  Return in about 2 months (around 11/14/2016) for blood pressure.      The entirety of the information documented in the History of Present Illness, Review of Systems and Physical Exam were personally obtained by me. Portions of this information were initially documented by April M. Sabra Heck, CMA and reviewed by me for thoroughness and accuracy.    Lelon Huh, MD  [facility]

## 2016-09-20 ENCOUNTER — Other Ambulatory Visit: Payer: Self-pay | Admitting: Family Medicine

## 2016-09-23 ENCOUNTER — Other Ambulatory Visit: Payer: Self-pay | Admitting: Family Medicine

## 2016-09-23 DIAGNOSIS — M5441 Lumbago with sciatica, right side: Principal | ICD-10-CM

## 2016-09-23 DIAGNOSIS — G8929 Other chronic pain: Secondary | ICD-10-CM

## 2016-09-23 MED ORDER — HYDROCODONE-ACETAMINOPHEN 10-325 MG PO TABS: 1.0000 | ORAL_TABLET | Freq: Four times a day (QID) | ORAL | 0 refills | Status: DC | PRN

## 2016-09-23 NOTE — Telephone Encounter (Signed)
Pt called for refill on his hydrocodone 10-325  Thanks teri

## 2016-10-07 ENCOUNTER — Other Ambulatory Visit: Payer: Self-pay | Admitting: Family Medicine

## 2016-10-07 DIAGNOSIS — G8929 Other chronic pain: Secondary | ICD-10-CM

## 2016-10-07 DIAGNOSIS — M5441 Lumbago with sciatica, right side: Principal | ICD-10-CM

## 2016-10-07 NOTE — Telephone Encounter (Signed)
Pt needs refill on his Hydrocodone 10-325  Thanks teri

## 2016-10-08 MED ORDER — HYDROCODONE-ACETAMINOPHEN 10-325 MG PO TABS: 1.0000 | ORAL_TABLET | Freq: Four times a day (QID) | ORAL | 0 refills | Status: DC | PRN

## 2016-10-22 ENCOUNTER — Telehealth: Payer: Self-pay | Admitting: Family Medicine

## 2016-10-22 DIAGNOSIS — M5441 Lumbago with sciatica, right side: Principal | ICD-10-CM

## 2016-10-22 DIAGNOSIS — G8929 Other chronic pain: Secondary | ICD-10-CM

## 2016-10-22 MED ORDER — HYDROCODONE-ACETAMINOPHEN 10-325 MG PO TABS: 1.0000 | ORAL_TABLET | Freq: Four times a day (QID) | ORAL | 0 refills | Status: DC | PRN

## 2016-10-22 NOTE — Telephone Encounter (Signed)
Patient needs refills of Norco. Wants to pick it up today.

## 2016-10-25 ENCOUNTER — Telehealth: Payer: Self-pay | Admitting: Family Medicine

## 2016-10-25 DIAGNOSIS — G4700 Insomnia, unspecified: Secondary | ICD-10-CM

## 2016-10-25 NOTE — Telephone Encounter (Signed)
rx called in-Quinne Pires V Nahom Carfagno, RMA  

## 2016-11-04 ENCOUNTER — Other Ambulatory Visit: Payer: Self-pay | Admitting: Family Medicine

## 2016-11-04 DIAGNOSIS — M5441 Lumbago with sciatica, right side: Principal | ICD-10-CM

## 2016-11-04 DIAGNOSIS — G8929 Other chronic pain: Secondary | ICD-10-CM

## 2016-11-04 MED ORDER — HYDROCODONE-ACETAMINOPHEN 10-325 MG PO TABS: 1.0000 | ORAL_TABLET | Freq: Four times a day (QID) | ORAL | 0 refills | Status: DC | PRN

## 2016-11-04 NOTE — Telephone Encounter (Signed)
Please review. Thanks!  

## 2016-11-04 NOTE — Telephone Encounter (Signed)
Pt requesting refill of Hydrocodone 10-325.  States he will be out in 2 days or so.

## 2016-11-05 MED ORDER — TADALAFIL 5 MG PO TABS: 5.0000 mg | ORAL_TABLET | Freq: Every day | ORAL | 5 refills | Status: DC | PRN

## 2016-11-05 NOTE — Telephone Encounter (Signed)
Pt contacted office for refill request on the following medications:  Cialis 5 mg  Last Rx: 10/09/13 LOV: 09/14/16 NOV: 11/15/16 [facility] [place]  Pt is requesting a refill on medication he hasn't taken in a couple years and wanted to know if he could get a refill or if he would need to be seen before getting the medication. Please advise. Thanks TNP

## 2016-11-05 NOTE — Telephone Encounter (Signed)
Please advise 

## 2016-11-15 ENCOUNTER — Other Ambulatory Visit: Payer: Self-pay | Admitting: Family Medicine

## 2016-11-15 ENCOUNTER — Ambulatory Visit (INDEPENDENT_AMBULATORY_CARE_PROVIDER_SITE_OTHER): Payer: 59 | Admitting: Family Medicine

## 2016-11-15 ENCOUNTER — Encounter: Payer: Self-pay | Admitting: Family Medicine

## 2016-11-15 VITALS — BP 124/80 | HR 87 | Temp 98.0°F | Resp 16 | Ht 69.0 in | Wt 181.0 lb

## 2016-11-15 DIAGNOSIS — G8929 Other chronic pain: Secondary | ICD-10-CM | POA: Diagnosis not present

## 2016-11-15 DIAGNOSIS — I10 Essential (primary) hypertension: Secondary | ICD-10-CM | POA: Diagnosis not present

## 2016-11-15 DIAGNOSIS — J301 Allergic rhinitis due to pollen: Secondary | ICD-10-CM

## 2016-11-15 DIAGNOSIS — M5441 Lumbago with sciatica, right side: Secondary | ICD-10-CM

## 2016-11-15 MED ORDER — CITALOPRAM HYDROBROMIDE 40 MG PO TABS: 40.0000 mg | ORAL_TABLET | Freq: Every day | ORAL | 12 refills | Status: DC

## 2016-11-15 MED ORDER — HYDROCODONE-ACETAMINOPHEN 10-325 MG PO TABS: 1.0000 | ORAL_TABLET | Freq: Four times a day (QID) | ORAL | 0 refills | Status: DC | PRN

## 2016-11-15 NOTE — Progress Notes (Signed)
Patient: Gary Carter Male    DOB: 1950-07-24   66 y.o.   MRN: [id number] Visit Date: 11/15/2016  Today's Provider: Lelon Huh, MD   Chief Complaint  Patient presents with  . Follow-up  . Hypertension   Subjective:    HPI   Hypertension, follow-up:  BP Readings from Last 3 Encounters:  11/15/16 124/80  09/14/16 (!) 142/94  08/20/16 (!) 150/110    He was last seen for hypertension 2 months ago.  BP at that visit was 142/94. Management since that visit includes; changed dose of miocardias and increased metoprolol to 50 mg.He reports good compliance with treatment. He is not having side effects. none He is exercising. He is adherent to low salt diet.   Outside blood pressures are not checking due to problem with his machine. He is experiencing none.  Patient denies none.   Cardiovascular risk factors include none.  Use of agents associated with hypertension: none.   ----------------------------------------------------------------    Patient states that he has been having a heaviness in his chest, sob and a cough. Is productive clear sputum when he first gets up in the morning. Started back on OTC Allegra a few day ago which has helped with drainage and cough seems a little improved. Is trying to quit smoking. Has cut back down.    He also is due for refill hydrocodone/apap He feels this remains effective for his back pain. Is taking four times most day and tolerating without adverse effects.   No Known Allergies   Current Outpatient Medications:  .  amLODipine (NORVASC) 10 MG tablet, TAKE 1 TABLET BY MOUTH EVERY DAY, Disp: 30 tablet, Rfl: 12 .  aspirin 325 MG tablet, Take 325 mg by mouth once. , Disp: , Rfl:  .  Cholecalciferol (VITAMIN D3 PO), Take daily by mouth., Disp: , Rfl:  .  citalopram (CELEXA) 40 MG tablet, Take 1 tablet (40 mg total) by mouth daily., Disp: 30 tablet, Rfl: 2 .  clonazePAM (KLONOPIN) 0.5 MG tablet, TAKE 1 TABLET BY MOUTH EVERY 8  HOURS AS NEEDED FOR ANXIETY, Disp: 90 tablet, Rfl: 4 .  fexofenadine (ALLEGRA) 180 MG tablet, Take 180 mg daily by mouth., Disp: , Rfl:  .  hydrochlorothiazide (HYDRODIURIL) 25 MG tablet, TAKE 1 TABLET BY MOUTH EVERY DAY, Disp: 30 tablet, Rfl: 12 .  HYDROcodone-acetaminophen (NORCO) 10-325 MG tablet, Take 1 tablet every 6 (six) hours as needed by mouth., Disp: 60 tablet, Rfl: 0 .  metoprolol succinate (TOPROL-XL) 50 MG 24 hr tablet, Take 1 tablet (50 mg total) by mouth daily. In the evening, Disp: 30 tablet, Rfl: 5 .  omeprazole (PRILOSEC) 40 MG capsule, TAKE ONE CAPSULE BY MOUTH EVERY DAY, Disp: 30 capsule, Rfl: 12 .  POTASSIUM PO, Take daily by mouth., Disp: , Rfl:  .  tadalafil (CIALIS) 5 MG tablet, Take 1 tablet (5 mg total) by mouth daily as needed for erectile dysfunction., Disp: 30 tablet, Rfl: 5 .  telmisartan-hydrochlorothiazide (MICARDIS HCT) 80-25 MG tablet, Take 1 tablet by mouth daily., Disp: 30 tablet, Rfl: 5 .  vitamin E 400 UNIT capsule, Take 400 Units by mouth daily. , Disp: , Rfl:  .  cloNIDine (CATAPRES) 0.1 MG tablet, TAKE 1 TABLET BY MOUTH TWICE A DAY, Disp: 60 tablet, Rfl: 12  Review of Systems  Constitutional: Negative for appetite change, chills and fever.  Respiratory: Positive for cough, chest tightness and shortness of breath. Negative for wheezing.   Cardiovascular: Negative for  chest pain and palpitations.  Gastrointestinal: Negative for abdominal pain, nausea and vomiting.    Social History   Tobacco Use  . Smoking status: Current Some Day Smoker    Packs/day: 0.50    Years: 5.00    Pack years: 2.50    Types: Cigarettes  . Smokeless tobacco: Never Used  . Tobacco comment: quit for 30 years  Substance Use Topics  . Alcohol use: Yes    Alcohol/week: 0.0 oz    Comment: drinks wine every night before bed   Objective:   BP 124/80 (BP Location: Right Arm, Patient Position: Sitting, Cuff Size: Large)   Pulse 87   Temp 98 F (36.7 C) (Oral)   Resp 16   Ht  5\' 9"  (1.753 m)   Wt 181 lb (82.1 kg)   SpO2 98%   BMI 26.73 kg/m  Vitals:   11/15/16 0823  BP: 124/80  Pulse: 87  Resp: 16  Temp: 98 F (36.7 C)  TempSrc: Oral  SpO2: 98%  Weight: 181 lb (82.1 kg)  Height: 5\' 9"  (1.753 m)     Physical Exam  General Appearance:    Alert, cooperative, no distress  HENT:   bilateral TM normal without fluid or infection, throat normal without erythema or exudate, sinuses nontender and nasal mucosa pale and congested  Eyes:    PERRL, conjunctiva/corneas clear, EOM's intact       Lungs:     Clear to auscultation bilaterally, respirations unlabored  Heart:    Regular rate and rhythm  MS:   Diffuse lumbar and para lumbar tenderness. No gross deformities.           Assessment & Plan:     1. Essential (primary) hypertension Much better with change to telmisarta-hctz and increased dose of metoprolol  2. Chronic bilateral low back pain with right-sided sciatica Doing well with current pain medication regiment.  - HYDROcodone-acetaminophen (NORCO) 10-325 MG tablet; Take 1 tablet every 6 (six) hours as needed by mouth.  Dispense: 60 tablet; Refill: 0  3. Allergic rhinitis due to pollen, unspecified seasonality May continue OTC Allegra  Return in about 4 months (around 03/15/2017).        Lelon Huh, MD  [facility]

## 2016-11-15 NOTE — Telephone Encounter (Signed)
[facility]  the following medication. Thanks CC  citalopram (CELEXA) 40 MG tablet

## 2016-11-16 ENCOUNTER — Other Ambulatory Visit: Payer: Self-pay | Admitting: Family Medicine

## 2016-11-16 NOTE — Telephone Encounter (Signed)
Please review. Thanks!  

## 2016-11-16 NOTE — Telephone Encounter (Signed)
[facility] supply for the following medication. Thanks CC  cloNIDine (CATAPRES) 0.1 MG tablet

## 2016-11-17 ENCOUNTER — Other Ambulatory Visit: Payer: Self-pay | Admitting: Family Medicine

## 2016-11-17 NOTE — Telephone Encounter (Signed)
Requesting 90 day supply.

## 2016-11-17 NOTE — Telephone Encounter (Signed)
[facility] supply for the following medication. Thanks CC  cloNIDine (CATAPRES) 0.1 MG tablet

## 2016-11-25 ENCOUNTER — Other Ambulatory Visit: Payer: Self-pay | Admitting: Family Medicine

## 2016-11-25 NOTE — Telephone Encounter (Signed)
Requesting 90 day supply.

## 2016-11-25 NOTE — Telephone Encounter (Signed)
[facility] supply for the following medication. Thanks CC  cloNIDine (CATAPRES) 0.1 MG tablet

## 2016-11-30 ENCOUNTER — Other Ambulatory Visit: Payer: Self-pay | Admitting: Family Medicine

## 2016-11-30 DIAGNOSIS — G8929 Other chronic pain: Secondary | ICD-10-CM

## 2016-11-30 DIAGNOSIS — M5441 Lumbago with sciatica, right side: Principal | ICD-10-CM

## 2016-11-30 MED ORDER — HYDROCODONE-ACETAMINOPHEN 10-325 MG PO TABS: 1.0000 | ORAL_TABLET | Freq: Four times a day (QID) | ORAL | 0 refills | Status: DC | PRN

## 2016-11-30 NOTE — Telephone Encounter (Signed)
Please advise patient that hydrocodone/apap  prescription has been sent electronically to   [id number] [place] [address] [place] [address] Phone: [phone] Fax: [phone]

## 2016-11-30 NOTE — Telephone Encounter (Signed)
Please review. Thanks!  

## 2016-11-30 NOTE — Telephone Encounter (Signed)
Patient advised.

## 2016-11-30 NOTE — Telephone Encounter (Signed)
Pt contacted office for refill request on the following medications:  HYDROcodone-acetaminophen (NORCO) 10-325 MG tablet   [facility][phone]

## 2016-12-05 ENCOUNTER — Ambulatory Visit
Admission: EM | Admit: 2016-12-05 | Discharge: 2016-12-05 | Disposition: A | Discharge status: Home / Self Care | Payer: 59 | Attending: Emergency Medicine | Admitting: Emergency Medicine

## 2016-12-05 ENCOUNTER — Encounter: Payer: Self-pay | Admitting: Emergency Medicine

## 2016-12-05 DIAGNOSIS — J0140 Acute pansinusitis, unspecified: Secondary | ICD-10-CM

## 2016-12-05 DIAGNOSIS — H6123 Impacted cerumen, bilateral: Secondary | ICD-10-CM | POA: Diagnosis not present

## 2016-12-05 MED ORDER — DOXYCYCLINE HYCLATE 100 MG PO CAPS: 100.0000 mg | ORAL_CAPSULE | Freq: Two times a day (BID) | ORAL | 0 refills | Status: AC

## 2016-12-05 MED ORDER — FLUTICASONE PROPIONATE 50 MCG/ACT NA SUSP: 2.0000 | Freq: Every day | NASAL | 0 refills | Status: DC

## 2016-12-05 MED ORDER — HYDROCOD POLST-CPM POLST ER 10-8 MG/5ML PO SUER: 5.0000 mL | Freq: Two times a day (BID) | ORAL | 0 refills | Status: DC | PRN

## 2016-12-05 NOTE — ED Provider Notes (Signed)
HPI  SUBJECTIVE:  Gary Carter is a 66 y.o. male who presents with "a sinus infection" starting yesterday.  He reports nasal congestion, body aches, rhinorrhea, postnasal drip, cough productive of whitish sputum.  States it is occasionally blood streaked.  Denies hemoptysis.  He reports sinus pain and pressure and states that his upper teeth hurt.  States he is unable to sleep at night secondary to the coughing.  He has chest soreness secondary to the cough.   He denies fevers, facial swelling, wheezing, shortness of breath, allergy type symptoms.  He is currently not taking any nasal steroids.  He took an antipyretic within the past 6-8 hours.  He has tried Alka-Seltzer night and day with improvement in his symptoms, symptoms are worse with lying down.  He also reports bilateral ear fullness/ache. no change in hearing, otorrhea.  Past medical history sinusitis, allergies, hypertension.  No history of diabetes.  WCB:JSEGBT, Kirstie Peri, MD   Past Medical History:  Diagnosis Date  . Anxiety   . GERD (gastroesophageal reflux disease)   . History of chicken pox 06/12/2014   DID have Chicken Pox. DID have Measles. DID have Mumps. DID have Rubella.    . History of measles   . History of mumps   . History of rubella   . Hypertension   . Panic disorder   . Sleep apnea     Past Surgical History:  Procedure Laterality Date  . ABDOMINAL SURGERY    . BACK SURGERY  12-04-2003   Lumbar fusion & C5-C6 fusion  . HERNIA REPAIR    . Myocardial perfusion scan  10/10/2006  . NECK SURGERY    . NERVE, TENDON AND ARTERY REPAIR Left 10/18/2012   Procedure: NERVE, TENDON AND ARTERY REPAIR;  Surgeon: Linna Hoff, MD;  Location: [place];  Service: Orthopedics;  Laterality: Left;  . NISSEN FUNDOPLICATION  5176  . UPPER GI ENDOSCOPY  04/09/2007   Dr. Tiffany Kocher; Mucosal changes suspicious for Barretts esophagits  . WOUND EXPLORATION Left 10/18/2012   Procedure: WOUND EXPLORATION;  Surgeon: Linna Hoff, MD;   Location: [facility];  Service: Orthopedics;  Laterality: Left;    Family History  Problem Relation Age of Onset  . Hypertension Sister   . Bipolar disorder Sister   . Emphysema Mother   . Diabetes Daughter 3  . Bipolar disorder Brother   . Hypertension Brother   . Obesity Brother     Social History   Tobacco Use  . Smoking status: Current Some Day Smoker    Packs/day: 0.50    Years: 5.00    Pack years: 2.50    Types: Cigarettes  . Smokeless tobacco: Never Used  . Tobacco comment: quit for 30 years  Substance Use Topics  . Alcohol use: Yes    Alcohol/week: 0.0 oz    Comment: drinks wine every night before bed  . Drug use: No    Comment: previously smoked marijuana years ago    No current facility-administered medications for this encounter.   Current Outpatient Medications:  .  amLODipine (NORVASC) 10 MG tablet, TAKE 1 TABLET BY MOUTH EVERY DAY, Disp: 30 tablet, Rfl: 12 .  aspirin 325 MG tablet, Take 325 mg by mouth once. , Disp: , Rfl:  .  chlorpheniramine-HYDROcodone (TUSSIONEX PENNKINETIC ER) 10-8 MG/5ML SUER, Take 5 mLs by mouth every 12 (twelve) hours as needed for cough., Disp: 120 mL, Rfl: 0 .  Cholecalciferol (VITAMIN D3 PO), Take daily by mouth., Disp: , Rfl:  .  citalopram (CELEXA) 40 MG tablet, Take 1 tablet (40 mg total) daily by mouth., Disp: 30 tablet, Rfl: 12 .  clonazePAM (KLONOPIN) 0.5 MG tablet, TAKE 1 TABLET BY MOUTH EVERY 8 HOURS AS NEEDED FOR ANXIETY, Disp: 90 tablet, Rfl: 4 .  cloNIDine (CATAPRES) 0.1 MG tablet, TAKE 1 TABLET BY MOUTH TWICE A DAY, Disp: 60 tablet, Rfl: 12 .  doxycycline (VIBRAMYCIN) 100 MG capsule, Take 1 capsule (100 mg total) by mouth 2 (two) times daily for 7 days., Disp: 14 capsule, Rfl: 0 .  fluticasone (FLONASE) 50 MCG/ACT nasal spray, Place 2 sprays into both nostrils daily., Disp: 16 g, Rfl: 0 .  hydrochlorothiazide (HYDRODIURIL) 25 MG tablet, TAKE 1 TABLET BY MOUTH EVERY DAY, Disp: 30 tablet, Rfl: 12 .  metoprolol succinate  (TOPROL-XL) 50 MG 24 hr tablet, Take 1 tablet (50 mg total) by mouth daily. In the evening, Disp: 30 tablet, Rfl: 5 .  omeprazole (PRILOSEC) 40 MG capsule, TAKE ONE CAPSULE BY MOUTH EVERY DAY, Disp: 30 capsule, Rfl: 12 .  POTASSIUM PO, Take daily by mouth., Disp: , Rfl:  .  tadalafil (CIALIS) 5 MG tablet, Take 1 tablet (5 mg total) by mouth daily as needed for erectile dysfunction., Disp: 30 tablet, Rfl: 5 .  telmisartan-hydrochlorothiazide (MICARDIS HCT) 80-25 MG tablet, Take 1 tablet by mouth daily., Disp: 30 tablet, Rfl: 5 .  vitamin E 400 UNIT capsule, Take 400 Units by mouth daily. , Disp: , Rfl:   No Known Allergies   ROS  As noted in HPI.   Physical Exam  BP (!) 143/99 (BP Location: Left Arm)   Pulse 77   Temp 98.3 F (36.8 C)   Resp 18   Ht 5\' 9"  (1.753 m)   Wt 187 lb (84.8 kg)   SpO2 96%   BMI 27.62 kg/m   Constitutional: Well developed, well nourished, no acute distress Eyes:  EOMI, conjunctiva normal bilaterally HENT: Normocephalic, atraumatic,mucus membranes moist.  Bilateral cerumen impaction. +clear  nasal congestion. red turbinates. + maxillary sinus tenderness, + frontal sinus tenderness. Oropharynx normal -obvious postnasal drip.  Respiratory: Normal inspiratory effort lungs clear bilaterally, good air movement Cardiovascular: Normal rate GI: nondistended skin: No rash, skin intact Musculoskeletal: no deformities Neurologic: Alert & oriented x 3, no focal neuro deficits Psychiatric: Speech and behavior appropriate   ED Course   Medications - No data to display  No orders of the defined types were placed in this encounter.   No results found for this or any previous visit (from the past 24 hour(s)). No results found.  ED Clinical Impression  Acute pansinusitis, recurrence not specified  Bilateral impacted cerumen   ED Assessment/Plan  Procedure note: Using a curette, removed large amount of cerumen from both ears bilaterally.  External ear  canal and TMs were normal post cerumen removal.  Will start nasal steriods, mucinex, increase fluids, nasal saline irrigation,  tylenol/motrin prn pain, Tussionex.  With a wait-and-see prescription of doxycycline.  Advised patient to wait 3 or 4 days before filling this, discussed that this was most likely viral.  Discussed with patient to not take hydrocodone and Tussionex.  [facility] narcotic database reviewed.  Feel that it is appropriate to prescribe a controlled substance.    Discussed MDM and plan with pt. Discussed sn/sx that should prompt return to the  ED. Pt agrees with plan and will f/u with PMD prn.     *This clinic note was created using Dragon dictation software. Therefore, there may be occasional  mistakes despite careful proofreading.  ?    Melynda Ripple, MD 12/05/16 1050

## 2016-12-05 NOTE — Discharge Instructions (Signed)
Do not take your Norco if you are taking the Tussionex as they both have hydrocodone in them, and too much hydrocodone can make you stop breathing.  Return to the ER if you get worse, have a fever >100.4, or for any concerns. You may take 600 mg of motrin with 1 gram of tylenol up to 3-4 times a day as needed for pain. This is an effective combination for pain.  Most sinus infections are viral and do not need antibiotics unless you have a high fever, have had this for 10 days, or you get better and then get sick again. Use a neti pot or the NeilMed sinus rinse as often as you want to to reduce nasal congestion. Follow the directions on the box.   Go to www.goodrx.com to look up your medications. This will give you a list of where you can find your prescriptions at the most affordable prices. Or you can ask the pharmacist what the cash price is. This is frequently cheaper than going through insurance.

## 2016-12-05 NOTE — ED Triage Notes (Signed)
Patient states he has been coughing up bloody sputum, felt chilled and feverish and head ache since yesterday

## 2016-12-13 NOTE — Progress Notes (Signed)
Patient: Gary Carter Male    DOB: 02/06/1950   66 y.o.   MRN: [id number] Visit Date: 12/14/2016  Today's Provider: Lelon Huh, MD   Chief Complaint  Patient presents with  . leg cramps   Subjective:    HPI  Patient comes in today c/o cramping in his lower legs X 1 month. He reports that he reports the cramping is becoming more frequent in the last week. He feels that it may be coming from one of his blood pressure medications. He reports that he does take an OTC potassium supplement to see if the cramping would stop, but he has had no relief. Cramps have been waking him at night. Sometimes gets cramping in backs of legs when walking.     He reports that his depression seems to worsen over the holidays.     No Known Allergies   Current Outpatient Medications:  .  amLODipine (NORVASC) 10 MG tablet, TAKE 1 TABLET BY MOUTH EVERY DAY, Disp: 30 tablet, Rfl: 12 .  aspirin 325 MG tablet, Take 325 mg by mouth once. , Disp: , Rfl:  .  Cholecalciferol (VITAMIN D3 PO), Take daily by mouth., Disp: , Rfl:  .  citalopram (CELEXA) 40 MG tablet, Take 1 tablet (40 mg total) daily by mouth., Disp: 30 tablet, Rfl: 12 .  clonazePAM (KLONOPIN) 0.5 MG tablet, TAKE 1 TABLET BY MOUTH EVERY 8 HOURS AS NEEDED FOR ANXIETY, Disp: 90 tablet, Rfl: 4 .  cloNIDine (CATAPRES) 0.1 MG tablet, TAKE 1 TABLET BY MOUTH TWICE A DAY, Disp: 60 tablet, Rfl: 12 .  fluticasone (FLONASE) 50 MCG/ACT nasal spray, Place 2 sprays into both nostrils daily., Disp: 16 g, Rfl: 0 .  hydrochlorothiazide (HYDRODIURIL) 25 MG tablet, TAKE 1 TABLET BY MOUTH EVERY DAY, Disp: 30 tablet, Rfl: 12 .  metoprolol succinate (TOPROL-XL) 50 MG 24 hr tablet, Take 1 tablet (50 mg total) by mouth daily. In the evening, Disp: 30 tablet, Rfl: 5 .  omeprazole (PRILOSEC) 40 MG capsule, TAKE ONE CAPSULE BY MOUTH EVERY DAY, Disp: 30 capsule, Rfl: 12 .  POTASSIUM PO, Take daily by mouth., Disp: , Rfl:  .  tadalafil (CIALIS) 5 MG tablet, Take 1  tablet (5 mg total) by mouth daily as needed for erectile dysfunction., Disp: 30 tablet, Rfl: 5 .  telmisartan-hydrochlorothiazide (MICARDIS HCT) 80-25 MG tablet, Take 1 tablet by mouth daily., Disp: 30 tablet, Rfl: 5 .  vitamin E 400 UNIT capsule, Take 400 Units by mouth daily. , Disp: , Rfl:   Review of Systems  Constitutional: Negative for appetite change, chills and fever.  Respiratory: Negative for chest tightness, shortness of breath and wheezing.   Cardiovascular: Negative for chest pain and palpitations.  Gastrointestinal: Negative for abdominal pain, nausea and vomiting.  Musculoskeletal: Positive for myalgias.    Social History   Tobacco Use  . Smoking status: Current Some Day Smoker    Packs/day: 0.50    Years: 5.00    Pack years: 2.50    Types: Cigarettes  . Smokeless tobacco: Never Used  . Tobacco comment: quit for 30 years  Substance Use Topics  . Alcohol use: Yes    Alcohol/week: 0.0 oz    Comment: drinks wine every night before bed   Objective:   BP 132/80 (BP Location: Left Arm, Patient Position: Sitting, Cuff Size: Normal)   Pulse 76   Temp 97.9 F (36.6 C)   Resp 16   Ht 5' 9.5" (1.765 m)  Wt 181 lb (82.1 kg)   SpO2 98%   BMI 26.35 kg/m  Vitals:   12/14/16 0813  BP: 132/80  Pulse: 76  Resp: 16  Temp: 97.9 F (36.6 C)  SpO2: 98%  Weight: 181 lb (82.1 kg)  Height: 5' 9.5" (1.765 m)     Physical Exam   General Appearance:    Alert, cooperative, no distress  Eyes:    PERRL, conjunctiva/corneas clear, EOM's intact       Lungs:     Clear to auscultation bilaterally, respirations unlabored  Heart:    Regular rate and rhythm  Neurologic:   Awake, alert, oriented x 3. No apparent focal neurological           defect.   Vasc:  2+ pedal pulses bilateral. Cap refill about 3-4 seconds.        Assessment & Plan:     1. Leg cramps Try OTC magnexium oxide and B-complex vitamin. Can stop hctz if still not better.   2. Essential (primary)  hypertension May need to put hctz on hold due to leg cramps, but continue all other BP medications. Recheck in a month  3. Depression, recurrent ([place]) Add bupropion 150mg  twice a day to citalopram.   4. Chronic bilateral low back pain with right-sided sciatica Fairly well controlled, refill  HYDROcodone-acetaminophen (NORCO) 10-325 MG tablet; Take 1 tablet by mouth every 6 (six) hours as needed.  Dispense: 60 tablet; Refill: 0  Due for CPE, will schedule in one month.       Lelon Huh, MD  [facility]

## 2016-12-14 ENCOUNTER — Ambulatory Visit (INDEPENDENT_AMBULATORY_CARE_PROVIDER_SITE_OTHER): Payer: 59 | Admitting: Family Medicine

## 2016-12-14 ENCOUNTER — Encounter: Payer: Self-pay | Admitting: Family Medicine

## 2016-12-14 VITALS — BP 132/80 | HR 76 | Temp 97.9°F | Resp 16 | Ht 69.5 in | Wt 181.0 lb

## 2016-12-14 DIAGNOSIS — F339 Major depressive disorder, recurrent, unspecified: Secondary | ICD-10-CM | POA: Diagnosis not present

## 2016-12-14 DIAGNOSIS — G8929 Other chronic pain: Secondary | ICD-10-CM | POA: Diagnosis not present

## 2016-12-14 DIAGNOSIS — I10 Essential (primary) hypertension: Secondary | ICD-10-CM | POA: Diagnosis not present

## 2016-12-14 DIAGNOSIS — R252 Cramp and spasm: Secondary | ICD-10-CM

## 2016-12-14 DIAGNOSIS — M5441 Lumbago with sciatica, right side: Secondary | ICD-10-CM

## 2016-12-14 MED ORDER — BUPROPION HCL ER (SR) 150 MG PO TB12: ORAL_TABLET | ORAL | 2 refills | Status: DC

## 2016-12-14 MED ORDER — HYDROCODONE-ACETAMINOPHEN 10-325 MG PO TABS: 1.0000 | ORAL_TABLET | Freq: Four times a day (QID) | ORAL | 0 refills | Status: DC | PRN

## 2016-12-14 NOTE — Patient Instructions (Addendum)
   Start taking magnesium oxide 500mg  once a day   Start taking OTC B-Complex vitamin once a day   Stop taking hydrochlorothiazide for one week to see if leg cramps improve     Leg Cramps Leg cramps occur when a muscle or muscles tighten and you have no control over this tightening (involuntary muscle contraction). Muscle cramps can develop in any muscle, but the most common place is in the calf muscles of the leg. Those cramps can occur during exercise or when you are at rest. Leg cramps are painful, and they may last for a few seconds to a few minutes. Cramps may return several times before they finally stop. Usually, leg cramps are not caused by a serious medical problem. In many cases, the cause is not known. Some common causes include:  Overexertion.  Overuse from repetitive motions, or doing the same thing over and over.  Remaining in a certain position for a long period of time.  Improper preparation, form, or technique while performing a sport or an activity.  Dehydration.  Injury.  Side effects of some medicines.  Abnormally low levels of the salts and ions in your blood (electrolytes), especially potassium and calcium. These levels could be low if you are taking water pills (diuretics) or if you are pregnant.  Follow these instructions at home: Watch your condition for any changes. Taking the following actions may help to lessen any discomfort that you are feeling:  Stay well-hydrated. Drink enough fluid to keep your urine clear or pale yellow.  Try massaging, stretching, and relaxing the affected muscle. Do this for several minutes at a time.  For tight or tense muscles, use a warm towel, heating pad, or hot shower water directed to the affected area.  If you are sore or have pain after a cramp, applying ice to the affected area may relieve discomfort. ? Put ice in a plastic bag. ? Place a towel between your skin and the bag. ? Leave the ice on for 20 minutes, 2-3  times per day.  Avoid strenuous exercise for several days if you have been having frequent leg cramps.  Make sure that your diet includes the essential minerals for your muscles to work normally.  Take medicines only as directed by your health care provider.  Contact a health care provider if:  Your leg cramps get more severe or more frequent, or they do not improve over time.  Your foot becomes cold, numb, or blue. This information is not intended to replace advice given to you by your health care provider. Make sure you discuss any questions you have with your health care provider. Document Released: 02/05/2004 Document Revised: 06/05/2015 Document Reviewed: 12/05/2013 Elsevier Interactive Patient Education  [employer].

## 2016-12-17 ENCOUNTER — Other Ambulatory Visit: Payer: Self-pay | Admitting: Family Medicine

## 2016-12-26 ENCOUNTER — Ambulatory Visit
Admission: EM | Admit: 2016-12-26 | Discharge: 2016-12-26 | Disposition: A | Discharge status: Home / Self Care | Payer: 59 | Attending: Family Medicine | Admitting: Family Medicine

## 2016-12-26 ENCOUNTER — Other Ambulatory Visit: Payer: Self-pay

## 2016-12-26 ENCOUNTER — Encounter: Payer: Self-pay | Admitting: Gynecology

## 2016-12-26 DIAGNOSIS — J0100 Acute maxillary sinusitis, unspecified: Secondary | ICD-10-CM | POA: Diagnosis not present

## 2016-12-26 DIAGNOSIS — J029 Acute pharyngitis, unspecified: Secondary | ICD-10-CM | POA: Diagnosis not present

## 2016-12-26 LAB — RAPID STREP SCREEN (MED CTR MEBANE ONLY): Streptococcus, Group A Screen (Direct): NEGATIVE

## 2016-12-26 MED ORDER — AMOXICILLIN-POT CLAVULANATE 875-125 MG PO TABS: 1.0000 | ORAL_TABLET | Freq: Two times a day (BID) | ORAL | 0 refills | Status: DC

## 2016-12-26 NOTE — ED Provider Notes (Signed)
[facility] ____________________________________________  Time seen: Approximately 1000 AM  I have reviewed the triage vital signs and the nursing notes.   HISTORY  Chief Complaint Sore Throat   HPI Gary Carter is a 66 y.o. male presenting for evaluation of nasal congestion, cough congestion, sinus pressure and sore throat.  States symptoms have worsened over the last 3 days, but reports he was recently seen in the urgent care at the end of November and states that symptoms did improve with the medications that was given, however reports does not feel like the nasal congestion never fully resolved.  States he is continued to get thick yellowish nasal drainage out.  Denies accompanying fevers.  Reports has continued to eat and drink well.  Denies associated chest pain, shortness of breath, hemoptysis, fevers, weakness, syncope, near syncope.  Reports he has a lot of sick contacts at work.  Denies home sick contacts.  States has tried some over-the-counter cough and congestion medications without much change.  States he does have some bilateral ear discomfort and feeling of fluid in his ears as well.  States history of some sinus infections as well as seasonal allergies with some similar presentations.  Denies other complaints.  States mild sinus pressure and mild sore throat at this time, but denies any other pain complaints. Denies chest pain, shortness of breath, abdominal pain,  or rash. Denies recent sickness. Denies recent antibiotic use.   Birdie Sons, MD: PCP   Past Medical History:  Diagnosis Date  . Anxiety   . GERD (gastroesophageal reflux disease)   . History of chicken pox 06/12/2014   DID have Chicken Pox. DID have Measles. DID have Mumps. DID have Rubella.    . History of measles   . History of mumps   . History of rubella   . Hypertension   . Panic disorder   . Sleep apnea     Patient Active Problem List   Diagnosis Date Noted  . History of kidney  stones 05/04/2016  . Bunion 06/12/2014  . Insomnia 06/12/2014  . Panic attack 06/12/2014  . Obstructive apnea 06/12/2014  . Anxiety disorder 06/11/2014  . H/O suicide attempt 05/28/2010  . Esophagitis, reflux 05/15/2008  . Actinic keratoses 05/06/2008  . 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 Allergic rhinitis 03/08/2007  . Arthralgia of hand 12/14/2006  . HLD (hyperlipidemia) 08/15/2006  . Family history of coronary arteriosclerosis 08/13/2006  . 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 Chronic low back pain 12/07/2005  . Depression, recurrent ([place]) 12/05/2005  . Barrett esophagus 02/06/1997  . Essential (primary) hypertension 02/06/1997  . Current smoker 01/11/1958    Past Surgical History:  Procedure Laterality Date  . ABDOMINAL SURGERY    . BACK SURGERY  12-04-2003   Lumbar fusion & C5-C6 fusion  . HERNIA REPAIR    . Myocardial perfusion scan  10/10/2006  . NECK SURGERY    . NERVE, TENDON AND ARTERY REPAIR Left 10/18/2012   Procedure: NERVE, TENDON AND ARTERY REPAIR;  Surgeon: Linna Hoff, MD;  Location: [facility];  Service: Orthopedics;  Laterality: Left;  . NISSEN FUNDOPLICATION  7510  . UPPER GI ENDOSCOPY  04/09/2007   Dr. Tiffany Kocher; Mucosal changes suspicious for Barretts esophagits  . WOUND EXPLORATION Left 10/18/2012   Procedure: WOUND EXPLORATION;  Surgeon: Linna Hoff, MD;  Location: [facility];  Service: Orthopedics;  Laterality: Left;     No current facility-administered medications for this encounter.   Current Outpatient Medications:  .  amLODipine (NORVASC) 10 MG tablet, TAKE 1 TABLET BY MOUTH EVERY DAY, Disp: 30 tablet, Rfl: 12 .  aspirin 325 MG tablet, Take 325 mg by mouth once. , Disp: , Rfl:  .  buPROPion (WELLBUTRIN SR) 150 MG 12 hr tablet, 1 tablet daily for 3 days, then 1 tablet twice daily, Disp: 60 tablet, Rfl: 2 .  Cholecalciferol (VITAMIN D3 PO), Take daily by mouth., Disp: , Rfl:  .  citalopram (CELEXA) 40 MG tablet, Take 1 tablet (40  mg total) daily by mouth., Disp: 30 tablet, Rfl: 12 .  clonazePAM (KLONOPIN) 0.5 MG tablet, TAKE 1 TABLET BY MOUTH EVERY 8 HOURS AS NEEDED FOR ANXIETY, Disp: 90 tablet, Rfl: 4 .  cloNIDine (CATAPRES) 0.1 MG tablet, TAKE 1 TABLET BY MOUTH TWICE A DAY, Disp: 60 tablet, Rfl: 12 .  fluticasone (FLONASE) 50 MCG/ACT nasal spray, Place 2 sprays into both nostrils daily., Disp: 16 g, Rfl: 0 .  hydrochlorothiazide (HYDRODIURIL) 25 MG tablet, TAKE 1 TABLET BY MOUTH EVERY DAY, Disp: 30 tablet, Rfl: 12 .  metoprolol succinate (TOPROL-XL) 50 MG 24 hr tablet, Take 1 tablet (50 mg total) by mouth daily. In the evening, Disp: 30 tablet, Rfl: 5 .  omeprazole (PRILOSEC) 40 MG capsule, TAKE ONE CAPSULE BY MOUTH EVERY DAY, Disp: 30 capsule, Rfl: 12 .  POTASSIUM PO, Take daily by mouth., Disp: , Rfl:  .  tadalafil (CIALIS) 5 MG tablet, Take 1 tablet (5 mg total) by mouth daily as needed for erectile dysfunction., Disp: 30 tablet, Rfl: 5 .  telmisartan-hydrochlorothiazide (MICARDIS HCT) 80-25 MG tablet, Take 1 tablet by mouth daily., Disp: 30 tablet, Rfl: 5 .  vitamin E 400 UNIT capsule, Take 400 Units by mouth daily. , Disp: , Rfl:  .  amoxicillin-clavulanate (AUGMENTIN) 875-125 MG tablet, Take 1 tablet by mouth every 12 (twelve) hours., Disp: 20 tablet, Rfl: 0  Allergies Patient has no known allergies.  Family History  Problem Relation Age of Onset  . Hypertension Sister   . Bipolar disorder Sister   . Emphysema Mother   . Diabetes Daughter 3  . Bipolar disorder Brother   . Hypertension Brother   . Obesity Brother     Social History Social History   Tobacco Use  . Smoking status: Current Some Day Smoker    Packs/day: 0.50    Years: 5.00    Pack years: 2.50    Types: Cigarettes  . Smokeless tobacco: Never Used  . Tobacco comment: quit for 30 years  Substance Use Topics  . Alcohol use: Yes    Alcohol/week: 0.0 oz    Comment: drinks wine every night before bed  . Drug use: No    Comment:  previously smoked marijuana years ago    Review of Systems Constitutional: No fever/chills ENT: As above.  Cardiovascular: Denies chest pain. Respiratory: Denies shortness of breath. Gastrointestinal: No abdominal pain.   Genitourinary: Negative for dysuria. Musculoskeletal: Negative for back pain. Skin: Negative for rash.   ____________________________________________   PHYSICAL EXAM:  VITAL SIGNS: ED Triage Vitals  Enc Vitals Group     BP 12/26/16 0911 (!) 175/87     Pulse Rate 12/26/16 0911 60     Resp 12/26/16 0911 16     Temp 12/26/16 0911 99 F (37.2 C)     Temp Source 12/26/16 0911 Oral     SpO2 12/26/16 0911 96 %     Weight 12/26/16 0911 181 lb (82.1 kg)     Height --  Head Circumference --      Peak Flow --      Pain Score 12/26/16 0914 5     Pain Loc --      Pain Edu? --      Excl. in [place]? --    Vitals:   12/26/16 0911 12/26/16 1000  BP: (!) 175/87 (!) 152/90  Pulse: 60   Resp: 16   Temp: 99 F (37.2 C)   TempSrc: Oral   SpO2: 96%   Weight: 181 lb (82.1 kg)      Constitutional: Alert and oriented. Well appearing and in no acute distress. Eyes: Conjunctivae are normal.  Head: Atraumatic.Mild to moderate tenderness to palpation bilateral maxillary sinuses.  No frontal sinus tenderness to palpation.  No swelling. No erythema.   Ears: no erythema, normal TMs bilaterally.   Nose: nasal congestion with bilateral nasal turbinate erythema and edema.   Mouth/Throat: Mucous membranes are [employer] pharyngeal erythema.  No tonsillar swelling or exudate.  Neck: No stridor.  No cervical spine tenderness to palpation. Hematological/Lymphatic/Immunilogical: No cervical lymphadenopathy. Cardiovascular: Normal rate, regular rhythm. Grossly normal heart sounds.  Good peripheral circulation. Respiratory: Normal respiratory effort.  No retractions. No wheezes, rales or rhonchi. Good air movement.  Occasional dry cough noted in room.  Speaks in complete  sentences. Musculoskeletal: Steady gait.  No cervical, thoracic or lumbar tenderness to palpation.  Neurologic:  Normal speech and language.No gait instability. Skin:  Skin is warm, dry and intact. No rash noted. Psychiatric: Mood and affect are normal. Speech and behavior are normal.  ___________________________________________   LABS (all labs ordered are listed, but only abnormal results are displayed)  Labs Reviewed  RAPID STREP SCREEN (NOT AT [facility])  CULTURE, GROUP A STREP [facility])     PROCEDURES Procedures    INITIAL IMPRESSION / ASSESSMENT AND PLAN / ED COURSE  Pertinent labs & imaging results that were available during my care of the patient were reviewed by me and considered in my medical decision making (see chart for details).  Very well-appearing patient.  No acute distress.  Discussed suspect viral upper respiratory infection however concern for maxillary sinusitis as nasal congestion and sinus pressure is never fully resolved and continues with sinus pressure and inflammation noted at this time.  Quick strep negative, will culture.  Will treat patient with oral Augmentin.  Encourage closely monitoring blood pressure and avoidance of over-the-counter combination cough and congestion medications that may be increase in blood pressure.  Encourage PCP follow-up and continue monitoring blood pressure and current symptoms.Discussed indication, risks and benefits of medications with patient.   Discussed follow up and return parameters including no resolution or any worsening concerns. Patient verbalized understanding and agreed to plan.   ____________________________________________   FINAL CLINICAL IMPRESSION(S) / ED DIAGNOSES  Final diagnoses:  Acute maxillary sinusitis, recurrence not specified     ED Discharge Orders        Ordered    amoxicillin-clavulanate (AUGMENTIN) 875-125 MG tablet  Every 12 hours     12/26/16 0959       Note: This dictation was prepared  with Dragon dictation along with smaller phrase technology. Any transcriptional errors that result from this process are unintentional.         Marylene Land, NP 12/26/16 [phone]

## 2016-12-26 NOTE — Discharge Instructions (Signed)
Take medication as prescribed. Rest. Drink plenty of fluids.  ° °Follow up with your primary care physician this week as needed. Return to Urgent care for new or worsening concerns.  ° °

## 2016-12-26 NOTE — ED Triage Notes (Signed)
Patient c/o sore throat and chest congestion.

## 2016-12-27 ENCOUNTER — Telehealth: Payer: Self-pay | Admitting: Family Medicine

## 2016-12-27 DIAGNOSIS — G8929 Other chronic pain: Secondary | ICD-10-CM

## 2016-12-27 DIAGNOSIS — M5441 Lumbago with sciatica, right side: Principal | ICD-10-CM

## 2016-12-27 MED ORDER — HYDROCODONE-ACETAMINOPHEN 10-325 MG PO TABS: 1.0000 | ORAL_TABLET | Freq: Four times a day (QID) | ORAL | 0 refills | Status: DC | PRN

## 2016-12-27 NOTE — Telephone Encounter (Signed)
Please review. I do not see hydrocodone on patient's med list. Ok to order? Also, patient wants something called in for his cough. Thanks!

## 2016-12-27 NOTE — Telephone Encounter (Signed)
Pt contacted office for refill request on the following medications:  HYDROcodone-acetaminophen (NORCO) 10-325 MG tablet   Pt is also requesting a cough medication to help with a cough.  [facility] [place].  CB#[phone]/MW

## 2016-12-29 LAB — CULTURE, GROUP A STREP (THRC)

## 2017-01-01 ENCOUNTER — Other Ambulatory Visit: Payer: Self-pay | Admitting: Family Medicine

## 2017-01-01 DIAGNOSIS — I10 Essential (primary) hypertension: Secondary | ICD-10-CM

## 2017-01-07 ENCOUNTER — Other Ambulatory Visit: Payer: Self-pay | Admitting: Family Medicine

## 2017-01-07 NOTE — Telephone Encounter (Signed)
Requesting 90 day supply.

## 2017-01-07 NOTE — Telephone Encounter (Signed)
[facility] supply for the following medication. Thanks CC  buPROPion (WELLBUTRIN SR) 150 MG 12 hr tablet

## 2017-01-08 MED ORDER — BUPROPION HCL ER (SR) 150 MG PO TB12: ORAL_TABLET | ORAL | 4 refills | Status: DC

## 2017-01-10 ENCOUNTER — Other Ambulatory Visit: Payer: Self-pay | Admitting: Family Medicine

## 2017-01-10 DIAGNOSIS — M5441 Lumbago with sciatica, right side: Principal | ICD-10-CM

## 2017-01-10 DIAGNOSIS — G8929 Other chronic pain: Secondary | ICD-10-CM

## 2017-01-10 NOTE — Telephone Encounter (Signed)
Pt wants to have his HYDROcodone-acetaminophen (NORCO) 10-325 MG tablet  [facility] [place]

## 2017-01-12 MED ORDER — HYDROCODONE-ACETAMINOPHEN 10-325 MG PO TABS: 1.0000 | ORAL_TABLET | Freq: Four times a day (QID) | ORAL | 0 refills | Status: DC | PRN

## 2017-01-12 NOTE — Telephone Encounter (Signed)
Patient is completely out of Norco and needs this today.

## 2017-01-12 NOTE — Telephone Encounter (Signed)
Please review. Thanks!  

## 2017-01-17 ENCOUNTER — Encounter: Payer: Self-pay | Admitting: Family Medicine

## 2017-01-24 ENCOUNTER — Other Ambulatory Visit: Payer: Self-pay

## 2017-01-24 DIAGNOSIS — M5441 Lumbago with sciatica, right side: Principal | ICD-10-CM

## 2017-01-24 DIAGNOSIS — G8929 Other chronic pain: Secondary | ICD-10-CM

## 2017-01-24 MED ORDER — HYDROCODONE-ACETAMINOPHEN 10-325 MG PO TABS: 1.0000 | ORAL_TABLET | Freq: Four times a day (QID) | ORAL | 0 refills | Status: DC | PRN

## 2017-02-02 ENCOUNTER — Ambulatory Visit: Payer: Self-pay | Admitting: Family Medicine

## 2017-02-07 ENCOUNTER — Encounter: Payer: Self-pay | Admitting: Family Medicine

## 2017-02-07 ENCOUNTER — Ambulatory Visit (INDEPENDENT_AMBULATORY_CARE_PROVIDER_SITE_OTHER): Payer: 59 | Admitting: Family Medicine

## 2017-02-07 VITALS — BP 158/108 | HR 55 | Temp 97.7°F | Resp 16 | Ht 68.25 in | Wt 176.0 lb

## 2017-02-07 DIAGNOSIS — M5441 Lumbago with sciatica, right side: Secondary | ICD-10-CM

## 2017-02-07 DIAGNOSIS — Z1159 Encounter for screening for other viral diseases: Secondary | ICD-10-CM

## 2017-02-07 DIAGNOSIS — G8929 Other chronic pain: Secondary | ICD-10-CM

## 2017-02-07 DIAGNOSIS — E291 Testicular hypofunction: Secondary | ICD-10-CM

## 2017-02-07 DIAGNOSIS — I10 Essential (primary) hypertension: Secondary | ICD-10-CM | POA: Diagnosis not present

## 2017-02-07 DIAGNOSIS — F17210 Nicotine dependence, cigarettes, uncomplicated: Secondary | ICD-10-CM

## 2017-02-07 DIAGNOSIS — R252 Cramp and spasm: Secondary | ICD-10-CM | POA: Diagnosis not present

## 2017-02-07 DIAGNOSIS — Z0000 Encounter for general adult medical examination without abnormal findings: Secondary | ICD-10-CM | POA: Diagnosis not present

## 2017-02-07 DIAGNOSIS — Z23 Encounter for immunization: Secondary | ICD-10-CM | POA: Diagnosis not present

## 2017-02-07 MED ORDER — HYDROCODONE-ACETAMINOPHEN 10-325 MG PO TABS: 1.0000 | ORAL_TABLET | Freq: Four times a day (QID) | ORAL | 0 refills | Status: DC | PRN

## 2017-02-07 MED ORDER — BUPROPION HCL ER (SR) 150 MG PO TB12: 150.0000 mg | ORAL_TABLET | Freq: Two times a day (BID) | ORAL | 0 refills | Status: DC

## 2017-02-07 NOTE — Progress Notes (Signed)
Patient: Gary Carter, Male    DOB: 12/13/1950, 67 y.o.   MRN: [id number] Visit Date: 02/07/2017  Today's Provider: Lelon Huh, MD   Chief Complaint  Patient presents with  . Annual Exam  . Hypertension  . Depression   Subjective:    Annual physical exam Gary Carter is a 67 y.o. male who presents today for health maintenance and complete physical. He feels fairly well. He reports exercising 5 times a week. He reports he is sleeping poorly.  ----------------------------------------------------------------   Hypertension, follow-up:  BP Readings from Last 3 Encounters:  12/26/16 (!) 152/90  12/14/16 132/80  12/05/16 (!) 143/99    He was last seen for hypertension 8 weeks ago.  BP at that visit was 132/80. Management since that visit includes; advised that he may put HCTZ on hold for time being due to leg cramps. Will recheck in 1 month.He reports good compliance with treatment. He is not having side effects.  He is exercising. He is adherent to low salt diet.   Outside blood pressures are 144/80. He is experiencing none.  Patient denies chest pain, chest pressure/discomfort, claudication, dyspnea, exertional chest pressure/discomfort, fatigue, irregular heart beat, lower extremity edema, near-syncope, orthopnea, palpitations, paroxysmal nocturnal dyspnea, syncope and tachypnea.   Cardiovascular risk factors include advanced age (older than 5 for men, 8 for women), hypertension and male gender.  Use of agents associated with hypertension: NSAIDS.   ----------------------------------------------------------------  Leg cramps From 12/14/2016-Try OTC magnexium oxide and B-complex vitamin. Can stop hctz if still not better. Patient has put HCTZ on hold and reports leg cramps have resolved.  Depression, recurrent ([place]) From 12/14/2016-Added bupropion 150mg  twice a day to citalopram. Patient reports fair compliance with treatment, good tolerance and fair  symptom control. Patient is only [place] pill daily of Bupropion and he reports his Depression is stable.  Chronic bilateral low back pain with right-sided sciatica From 12/14/2016-no changes, refilled HYDROcodone-acetaminophen (NORCO) 10-325 MG tablet. Today patient reports this problem is stable.   He states that he would like to try testosterone again. He was on topical testosterone in the past and reports he felt better when he was taking it. He tolerated it well, but was too expensive. He is interested in trying testosterone injections.     Review of Systems  Constitutional: Positive for appetite change. Negative for chills, fatigue and fever.  HENT: Negative for congestion, ear pain, hearing loss, nosebleeds and trouble swallowing.   Eyes: Negative for pain and visual disturbance.  Respiratory: Negative for cough, chest tightness and shortness of breath.   Cardiovascular: Negative for chest pain, palpitations and leg swelling.  Gastrointestinal: Negative for abdominal pain, blood in stool, constipation, diarrhea, nausea and vomiting.  Endocrine: Negative for polydipsia, polyphagia and polyuria.  Genitourinary: Negative for dysuria and flank pain.  Musculoskeletal: Positive for back pain, neck pain and neck stiffness. Negative for arthralgias, joint swelling and myalgias.  Skin: Negative for color change, rash and wound.  Neurological: Negative for dizziness, tremors, seizures, speech difficulty, weakness, light-headedness and headaches.  Psychiatric/Behavioral: Negative for behavioral problems, confusion, decreased concentration, dysphoric mood and sleep disturbance. The patient is nervous/anxious.   All other systems reviewed and are negative.   Social History      He  reports that he has been smoking cigarettes.  He has a 2.50 pack-year smoking history. he has never used smokeless tobacco. He reports that he drinks alcohol. He reports that he does not use drugs.  Social  History   Socioeconomic History  . Marital status: Divorced    Spouse name: Not on file  . Number of children: 1  . Years of education: Not on file  . Highest education level: Not on file  Social Needs  . Financial resource strain: Not on file  . Food insecurity - worry: Not on file  . Food insecurity - inability: Not on file  . Transportation needs - medical: Not on file  . Transportation needs - non-medical: Not on file  Occupational History  . Occupation: Works at [employer]  . Smoking status: Current Some Day Smoker    Packs/day: 0.50    Years: 5.00    Pack years: 2.50    Types: Cigarettes  . Smokeless tobacco: Never Used  . Tobacco comment: quit for 30 years  Substance and Sexual Activity  . Alcohol use: Yes    Alcohol/week: 0.0 oz    Comment: drinks wine every night before bed  . Drug use: No    Comment: previously smoked marijuana years ago  . Sexual activity: Not on file  Other Topics Concern  . Not on file  Social History Narrative  . Not on file    Past Medical History:  Diagnosis Date  . Anxiety   . GERD (gastroesophageal reflux disease)   . History of chicken pox 06/12/2014   DID have Chicken Pox. DID have Measles. DID have Mumps. DID have Rubella.    . History of measles   . History of mumps   . History of rubella   . Hypertension   . Panic disorder   . Sleep apnea      Patient Active Problem List   Diagnosis Date Noted  . History of kidney stones 05/04/2016  . Bunion 06/12/2014  . Insomnia 06/12/2014  . Panic attack 06/12/2014  . Obstructive apnea 06/12/2014  . Anxiety disorder 06/11/2014  . H/O suicide attempt 05/28/2010  . Esophagitis, reflux 05/15/2008  . Actinic keratoses 05/06/2008  . 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 Allergic rhinitis 03/08/2007  . Arthralgia of hand 12/14/2006  . HLD (hyperlipidemia) 08/15/2006  . Family history of coronary arteriosclerosis 08/13/2006  . ED (erectile dysfunction) of organic origin  04/28/2006  . Chronic low back pain 12/07/2005  . Depression, recurrent ([place]) 12/05/2005  . Barrett esophagus 02/06/1997  . Essential (primary) hypertension 02/06/1997  . Current smoker 01/11/1958    Past Surgical History:  Procedure Laterality Date  . ABDOMINAL SURGERY    . BACK SURGERY  12-04-2003   Lumbar fusion & C5-C6 fusion  . HERNIA REPAIR    . Myocardial perfusion scan  10/10/2006  . NECK SURGERY    . NERVE, TENDON AND ARTERY REPAIR Left 10/18/2012   Procedure: NERVE, TENDON AND ARTERY REPAIR;  Surgeon: Linna Hoff, MD;  Location: [facility];  Service: Orthopedics;  Laterality: Left;  . NISSEN FUNDOPLICATION  3614  . UPPER GI ENDOSCOPY  04/09/2007   Dr. Tiffany Kocher; Mucosal changes suspicious for Barretts esophagits  . WOUND EXPLORATION Left 10/18/2012   Procedure: WOUND EXPLORATION;  Surgeon: Linna Hoff, MD;  Location: [facility];  Service: Orthopedics;  Laterality: Left;    Family History        Family Status  Relation Name Status  . Sister  Alive  . Sister  Alive  . Father  Deceased at age 13       MI  . Mother  Deceased at age 40  COPD  . Daughter  Alive  . Brother  (Not Specified)        His family history includes Bipolar disorder in his brother and sister; Diabetes (age of onset: 96) in his daughter; Emphysema in his mother; Hypertension in his brother and sister; Obesity in his brother.     No Known Allergies   Current Outpatient Medications:  .  telmisartan-hydrochlorothiazide (MICARDIS HCT) 80-25 MG tablet, TAKE 1 TABLET BY MOUTH EVERY DAY, Disp: 30 tablet, Rfl: 5 .  amLODipine (NORVASC) 10 MG tablet, TAKE 1 TABLET BY MOUTH EVERY DAY, Disp: 30 tablet, Rfl: 12 .  aspirin 325 MG tablet, Take 325 mg by mouth once. , Disp: , Rfl:  .  buPROPion (WELLBUTRIN SR) 150 MG 12 hr tablet, 1 tablet daily for 3 days, then 1 tablet twice daily (Patient taking differently: Take 150 mg by mouth daily. 1 tablet daily for 3 days, then 1 tablet twice daily), Disp: 180 tablet,  Rfl: 4 .  Cholecalciferol (VITAMIN D3 PO), Take daily by mouth., Disp: , Rfl:  .  citalopram (CELEXA) 40 MG tablet, Take 1 tablet (40 mg total) daily by mouth. (Patient not taking: Reported on 02/07/2017), Disp: 30 tablet, Rfl: 12 .  clonazePAM (KLONOPIN) 0.5 MG tablet, TAKE 1 TABLET BY MOUTH EVERY 8 HOURS AS NEEDED FOR ANXIETY, Disp: 90 tablet, Rfl: 4 .  cloNIDine (CATAPRES) 0.1 MG tablet, TAKE 1 TABLET BY MOUTH TWICE A DAY, Disp: 60 tablet, Rfl: 12 .  hydrochlorothiazide (HYDRODIURIL) 25 MG tablet, TAKE 1 TABLET BY MOUTH EVERY DAY (Patient not taking: Reported on 02/07/2017), Disp: 30 tablet, Rfl: 12 .  HYDROcodone-acetaminophen (NORCO) 10-325 MG tablet, Take 1 tablet by mouth every 6 (six) hours as needed., Disp: 60 tablet, Rfl: 0 .  metoprolol succinate (TOPROL-XL) 50 MG 24 hr tablet, Take 1 tablet (50 mg total) by mouth daily. In the evening, Disp: 30 tablet, Rfl: 5 .  omeprazole (PRILOSEC) 40 MG capsule, TAKE ONE CAPSULE BY MOUTH EVERY DAY, Disp: 30 capsule, Rfl: 12 .  POTASSIUM PO, Take daily by mouth., Disp: , Rfl:  .  tadalafil (CIALIS) 5 MG tablet, Take 1 tablet (5 mg total) by mouth daily as needed for erectile dysfunction., Disp: 30 tablet, Rfl: 5 .  vitamin E 400 UNIT capsule, Take 400 Units by mouth daily. , Disp: , Rfl:    Patient Care Team: Birdie Sons, MD as PCP - General (Family Medicine)      Objective:   Vitals: BP (!) 158/108 (BP Location: Right Arm, Cuff Size: Large)   Pulse (!) 55   Temp 97.7 F (36.5 C) (Oral)   Resp 16   Ht 5' 8.25" (1.734 m)   Wt 176 lb (79.8 kg)   SpO2 97% Comment: room air  BMI 26.57 kg/m    Vitals:   02/07/17 0933 02/07/17 0934  BP: (!) 156/106 (!) 158/108  Pulse: (!) 55   Resp: 16   Temp: 97.7 F (36.5 C)   TempSrc: Oral   SpO2: 97%   Weight: 176 lb (79.8 kg)   Height: 5' 8.25" (1.734 m)      Physical Exam   General Appearance:    Alert, cooperative, no distress, appears stated age  Head:    Normocephalic, without  obvious abnormality, atraumatic  Eyes:    PERRL, conjunctiva/corneas clear, EOM's intact, fundi    benign, both eyes       Ears:    Normal TM's and external ear canals, both ears  Nose:   Nares normal, septum midline, mucosa normal, no drainage   or sinus tenderness  Throat:   Lips, mucosa, and tongue normal; teeth and gums normal  Neck:   Supple, symmetrical, trachea midline, no adenopathy;       thyroid:  No enlargement/tenderness/nodules; no carotid   bruit or JVD  Back:     Symmetric, no curvature, ROM normal, no CVA tenderness  Lungs:     Clear to auscultation bilaterally, respirations unlabored  Chest wall:    No tenderness or deformity  Heart:    Regular rate and rhythm, S1 and S2 normal, no murmur, rub   or gallop  Abdomen:     Soft, non-tender, bowel sounds active all four quadrants,    no masses, no organomegaly  Genitalia:    deferred  Rectal:    deferred  Extremities:   Extremities normal, atraumatic, no cyanosis or edema  Pulses:   2+ and symmetric all extremities  Skin:   Skin color, texture, turgor normal, no rashes or lesions  Lymph nodes:   Cervical, supraclavicular, and axillary nodes normal  Neurologic:   CNII-XII intact. Normal strength, sensation and reflexes      throughout     Depression Screen PHQ 2/9 Scores 02/07/2017 03/08/2016  PHQ - 2 Score 0 5  PHQ- 9 Score 5 18      Assessment & Plan:     Routine Health Maintenance and Physical Exam  Exercise Activities and Dietary recommendations Goals    None      Immunization History  Administered Date(s) Administered  . Influenza,inj,Quad PF,6+ Mos 09/14/2016  . Influenza-Unspecified 10/29/2014  . Pneumococcal Polysaccharide-23 10/08/2011  . Tdap 10/08/2011  . Zoster 10/09/2013    Health Maintenance  Topic Date Due  . Hepatitis C Screening  [birth year]  . HIV Screening  07/11/1967  . COLONOSCOPY  06/17/2021  . TETANUS/TDAP  10/07/2021  . INFLUENZA VACCINE  Completed     Discussed health  benefits of physical activity, and encouraged him to engage in regular exercise appropriate for his age and condition.    -------------------------------------------------------------------- 1. Annual physical exam  - PSA  2. Need for hepatitis C screening test  - Hepatitis C antibody  3. Essential (primary) hypertension Leg cramps resolved since stopping hctz, but BP poorly controlled. Consider trial of spironolactone.  - Comprehensive metabolic panel - Lipid panel  4. Leg cramps   5. Testicular hypofunction  - Testosterone,Free and Total  6. Chronic bilateral low back pain with right-sided sciatica Stable. Doing well on  Current medication regiment.  - HYDROcodone-acetaminophen (NORCO) 10-325 MG tablet; Take 1 tablet by mouth every 6 (six) hours as needed.  Dispense: 60 tablet; Refill: 0  7. Need for shingles vaccine  - Varicella-zoster vaccine IM    Lelon Huh, MD  [facility]

## 2017-02-07 NOTE — Patient Instructions (Addendum)
   You should stop taking Vitamin E. It is know to increase risk of lung cancer in smokers.    You can take OTC men's multivitamin every day.    Please stop smoking

## 2017-02-08 LAB — HEPATITIS C ANTIBODY: Hep C Virus Ab: <0.1 s/co ratio (ref 0.0–0.9)

## 2017-02-08 LAB — COMPREHENSIVE METABOLIC PANEL
ALT: 26 IU/L (ref 0–44)
AST: 20 IU/L (ref 0–40)
Albumin/Globulin Ratio: 1.7 (ref 1.2–2.2)
Albumin: 4.3 g/dL (ref 3.6–4.8)
Alkaline Phosphatase: 54 IU/L (ref 39–117)
BUN/Creatinine Ratio: 18 (ref 10–24)
BUN: 12 mg/dL (ref 8–27)
Bilirubin Total: 1.1 mg/dL (ref 0.0–1.2)
CO2: 24 mmol/L (ref 20–29)
Calcium: 9.4 mg/dL (ref 8.6–10.2)
Chloride: 100 mmol/L (ref 96–106)
Creatinine, Ser: 0.65 mg/dL — ABNORMAL LOW (ref 0.76–1.27)
GFR calc Af Amer: 119 mL/min/{1.73_m2} (ref >59)
GFR calc non Af Amer: 103 mL/min/{1.73_m2} (ref >59)
Globulin, Total: 2.5 g/dL (ref 1.5–4.5)
Glucose: 104 mg/dL — ABNORMAL HIGH (ref 65–99)
Potassium: 3.9 mmol/L (ref 3.5–5.2)
Sodium: 139 mmol/L (ref 134–144)
Total Protein: 6.8 g/dL (ref 6.0–8.5)

## 2017-02-08 LAB — LIPID PANEL
Chol/HDL Ratio: 3 ratio (ref 0.0–5.0)
Cholesterol, Total: 167 mg/dL (ref 100–199)
HDL: 55 mg/dL (ref >39)
LDL Calculated: 99 mg/dL (ref 0–99)
Triglycerides: 66 mg/dL (ref 0–149)
VLDL Cholesterol Cal: 13 mg/dL (ref 5–40)

## 2017-02-08 LAB — TESTOSTERONE,FREE AND TOTAL
Testosterone, Free: 9.3 pg/mL (ref 6.6–18.1)
Testosterone: 344 ng/dL (ref 264–916)

## 2017-02-08 LAB — PSA: Prostate Specific Ag, Serum: 2.1 ng/mL (ref 0.0–4.0)

## 2017-02-09 ENCOUNTER — Telehealth: Payer: Self-pay | Admitting: *Deleted

## 2017-02-09 MED ORDER — SPIRONOLACTONE 25 MG PO TABS: 25.0000 mg | ORAL_TABLET | Freq: Every day | ORAL | 2 refills | Status: DC

## 2017-02-09 NOTE — Telephone Encounter (Signed)
LMOVM for pt to return call 

## 2017-02-09 NOTE — Telephone Encounter (Signed)
-----   Message from Birdie Sons, MD sent at 02/08/2017  1:54 PM EST ----- Testosterone levels are normal. No indication to take supplemental testosterone. Rest of labs are normal.  Recommend he add spironolactone 25mg  once a day, 30, rf x 2 for blood pressure. This is a fluid pill but does not usually cramps like the hctz. Follow up BP check one month.

## 2017-02-09 NOTE — Telephone Encounter (Signed)
Patient was notified of results. Rx sent to pharmacy. 

## 2017-02-09 NOTE — Telephone Encounter (Signed)
Pt returning call, states he can be reached at [phone].

## 2017-02-21 ENCOUNTER — Other Ambulatory Visit: Payer: Self-pay | Admitting: Family Medicine

## 2017-02-21 DIAGNOSIS — G8929 Other chronic pain: Secondary | ICD-10-CM

## 2017-02-21 DIAGNOSIS — M5441 Lumbago with sciatica, right side: Principal | ICD-10-CM

## 2017-02-21 NOTE — Telephone Encounter (Signed)
Patient is requesting a refill on the following medication  HYDROcodone-acetaminophen (NORCO) 10-325 MG tablet  He uses [facility]

## 2017-02-22 ENCOUNTER — Other Ambulatory Visit: Payer: Self-pay | Admitting: Family Medicine

## 2017-02-22 DIAGNOSIS — I10 Essential (primary) hypertension: Secondary | ICD-10-CM

## 2017-02-22 MED ORDER — METOPROLOL SUCCINATE ER 50 MG PO TB24: 50.0000 mg | ORAL_TABLET | Freq: Every day | ORAL | 3 refills | Status: DC

## 2017-02-22 NOTE — Telephone Encounter (Signed)
Please review. Thanks!  

## 2017-02-22 NOTE — Telephone Encounter (Signed)
[facility] 90-days supply for the following medication. Thanks CC  metoprolol succinate (TOPROL-XL) 50 MG 24 hr tablet

## 2017-02-23 NOTE — Telephone Encounter (Signed)
Pt calling back states his medication is not called in yet and he need prior to the weekend.

## 2017-02-24 MED ORDER — HYDROCODONE-ACETAMINOPHEN 10-325 MG PO TABS: 1.0000 | ORAL_TABLET | Freq: Four times a day (QID) | ORAL | 0 refills | Status: DC | PRN

## 2017-02-25 NOTE — Progress Notes (Unsigned)
WALK-IN Nurse visit Blood pressure check.  Patient walked into the office today requesting a blood pressure check. His reading were: 130/90 (Right arm), 140/92 (Left Arm). His blood pressure readings have improved since adding Spironolactone at the last office visit on 02/07/2017.  Patient has a follow up appointment scheduled on 03/15/2017 with Dr. Caryn Section.

## 2017-03-10 ENCOUNTER — Other Ambulatory Visit: Payer: Self-pay | Admitting: Family Medicine

## 2017-03-10 DIAGNOSIS — M5441 Lumbago with sciatica, right side: Principal | ICD-10-CM

## 2017-03-10 DIAGNOSIS — G8929 Other chronic pain: Secondary | ICD-10-CM

## 2017-03-10 MED ORDER — HYDROCODONE-ACETAMINOPHEN 10-325 MG PO TABS: 1.0000 | ORAL_TABLET | Freq: Four times a day (QID) | ORAL | 0 refills | Status: DC | PRN

## 2017-03-10 NOTE — Telephone Encounter (Signed)
Pt contacted office for refill request on the following medications:  HYDROcodone-acetaminophen (NORCO) 10-325 MG tablet  [facility]  Last Rx: 02/24/17 LOV: 02/07/17 Please advise. Thanks TNP

## 2017-03-14 ENCOUNTER — Ambulatory Visit: Payer: Self-pay | Admitting: Family Medicine

## 2017-03-15 ENCOUNTER — Ambulatory Visit (INDEPENDENT_AMBULATORY_CARE_PROVIDER_SITE_OTHER): Payer: 59 | Admitting: Family Medicine

## 2017-03-15 ENCOUNTER — Encounter: Payer: Self-pay | Admitting: Family Medicine

## 2017-03-15 VITALS — BP 134/92 | HR 59 | Temp 97.7°F | Resp 16 | Wt 172.0 lb

## 2017-03-15 DIAGNOSIS — I10 Essential (primary) hypertension: Secondary | ICD-10-CM

## 2017-03-15 MED ORDER — TADALAFIL 20 MG PO TABS: 20.0000 mg | ORAL_TABLET | Freq: Every day | ORAL | 8 refills | Status: DC | PRN

## 2017-03-15 NOTE — Progress Notes (Signed)
Patient: Gary Carter Male    DOB: 1950/05/01   67 y.o.   MRN: [id number] Visit Date: 03/15/2017  Today's Provider: Lelon Huh, MD   Chief Complaint  Patient presents with  . Hypertension   Subjective:    HPI   Hypertension, follow-up:  BP Readings from Last 3 Encounters:  02/25/17 (!) 140/92  02/07/17 (!) 158/108  12/26/16 (!) 152/90    He was last seen for hypertension 1 months ago.  BP at that visit was 158/108. Management since that visit includes; labs checked. Added spironolactone 25 mg qd.He reports good compliance with treatment. He had trouble with leg cramps when he was taking additional 25 hctz, but none with spironolactone. He is still taking telmisarta-hctz He is not having side effects.  He is exercising. He is adherent to low salt diet.   Outside blood pressures are 130's/80's.  ------------------------------------------------------------------------   No Known Allergies   Current Outpatient Medications:  .  amLODipine (NORVASC) 10 MG tablet, TAKE 1 TABLET BY MOUTH EVERY DAY, Disp: 30 tablet, Rfl: 12 .  aspirin 325 MG tablet, Take 325 mg by mouth once. , Disp: , Rfl:  .  buPROPion (WELLBUTRIN SR) 150 MG 12 hr tablet, Take 1 tablet (150 mg total) by mouth 2 (two) times daily., Disp: 3 tablet, Rfl: 0 .  Cholecalciferol (VITAMIN D3 PO), Take daily by mouth., Disp: , Rfl:  .  clonazePAM (KLONOPIN) 0.5 MG tablet, TAKE 1 TABLET BY MOUTH EVERY 8 HOURS AS NEEDED FOR ANXIETY, Disp: 90 tablet, Rfl: 4 .  cloNIDine (CATAPRES) 0.1 MG tablet, TAKE 1 TABLET BY MOUTH TWICE A DAY, Disp: 60 tablet, Rfl: 12 .  HYDROcodone-acetaminophen (NORCO) 10-325 MG tablet, Take 1 tablet by mouth every 6 (six) hours as needed., Disp: 60 tablet, Rfl: 0 .  metoprolol succinate (TOPROL-XL) 50 MG 24 hr tablet, Take 1 tablet (50 mg total) by mouth daily. In the evening, Disp: 90 tablet, Rfl: 3 .  omeprazole (PRILOSEC) 40 MG capsule, TAKE ONE CAPSULE BY MOUTH EVERY DAY, Disp: 30  capsule, Rfl: 12 .  POTASSIUM PO, Take daily by mouth., Disp: , Rfl:  .  spironolactone (ALDACTONE) 25 MG tablet, Take 1 tablet (25 mg total) by mouth daily., Disp: 30 tablet, Rfl: 2 .  tadalafil (CIALIS) 5 MG tablet, Take 1 tablet (5 mg total) by mouth daily as needed for erectile dysfunction., Disp: 30 tablet, Rfl: 5 .  telmisartan-hydrochlorothiazide (MICARDIS HCT) 80-25 MG tablet, TAKE 1 TABLET BY MOUTH EVERY DAY, Disp: 30 tablet, Rfl: 5  Review of Systems  Constitutional: Negative for appetite change, chills and fever.  Respiratory: Negative for chest tightness, shortness of breath and wheezing.   Cardiovascular: Negative for chest pain and palpitations.  Gastrointestinal: Negative for abdominal pain, nausea and vomiting.    Social History   Tobacco Use  . Smoking status: Current Some Day Smoker    Packs/day: 0.50    Years: 5.00    Pack years: 2.50    Types: Cigarettes  . Smokeless tobacco: Never Used  . Tobacco comment: quit for 30 years  Substance Use Topics  . Alcohol use: Yes    Alcohol/week: 0.0 oz    Comment: drinks wine every night before bed   Objective:   BP (!) 134/92 (BP Location: Right Arm, Cuff Size: Large)   Pulse (!) 59   Temp 97.7 F (36.5 C) (Oral)   Resp 16   Wt 172 lb (78 kg)   SpO2 96%  Comment: room air  BMI 25.96 kg/m  There were no vitals filed for this visit.   Physical Exam   General Appearance:    Alert, cooperative, no distress  Eyes:    PERRL, conjunctiva/corneas clear, EOM's intact       Lungs:     Clear to auscultation bilaterally, respirations unlabored  Heart:    Regular rate and rhythm  Neurologic:   Awake, alert, oriented x 3. No apparent focal neurological           defect.           Assessment & Plan:     1. Essential (primary) hypertension Improved since starting spironolactone last month. Leg cramps resolved since stopping hctz 25mg  tablets, but still on telmisartan which he is tolerating.   - Renal function  panel  Return in about 5 months (around 08/15/2017).        Lelon Huh, MD  [facility]

## 2017-03-16 LAB — RENAL FUNCTION PANEL
Albumin: 4.3 g/dL (ref 3.6–4.8)
BUN/Creatinine Ratio: 27 — ABNORMAL HIGH (ref 10–24)
BUN: 17 mg/dL (ref 8–27)
CO2: 25 mmol/L (ref 20–29)
Calcium: 9.7 mg/dL (ref 8.6–10.2)
Chloride: 103 mmol/L (ref 96–106)
Creatinine, Ser: 0.62 mg/dL — ABNORMAL LOW (ref 0.76–1.27)
GFR calc Af Amer: 121 mL/min/{1.73_m2} (ref >59)
GFR calc non Af Amer: 105 mL/min/{1.73_m2} (ref >59)
Glucose: 115 mg/dL — ABNORMAL HIGH (ref 65–99)
Phosphorus: 3.3 mg/dL (ref 2.5–4.5)
Potassium: 4.3 mmol/L (ref 3.5–5.2)
Sodium: 143 mmol/L (ref 134–144)

## 2017-03-17 ENCOUNTER — Telehealth: Payer: Self-pay | Admitting: Family Medicine

## 2017-03-17 NOTE — Telephone Encounter (Signed)
Patient states that when he takes Metoprolol, he gets a red rash on shoulders, arms and torso. Patient states the rash is itchy, red, and burns. Patient stated that he takes Metoprolol in the evenings and rash will appear shortly after. When he gets up in the mornings the rash will be gone. Patient states he that he just noticed yesterday which mediation was causing the rash. Please advise?

## 2017-03-17 NOTE — Telephone Encounter (Signed)
Have him reduce metoprolol to 1/2 tablet daily and call back in 3-4 days to let me know if this improves.

## 2017-03-17 NOTE — Telephone Encounter (Signed)
Patient was advised. Expressed understanding.  

## 2017-03-17 NOTE — Telephone Encounter (Signed)
Pt states since he has been taking Metoprolo 50 MG he has had a rash on his trunk.

## 2017-03-22 ENCOUNTER — Other Ambulatory Visit: Payer: Self-pay | Admitting: Family Medicine

## 2017-03-22 DIAGNOSIS — M5441 Lumbago with sciatica, right side: Principal | ICD-10-CM

## 2017-03-22 DIAGNOSIS — G8929 Other chronic pain: Secondary | ICD-10-CM

## 2017-03-22 MED ORDER — HYDROCODONE-ACETAMINOPHEN 10-325 MG PO TABS: 1.0000 | ORAL_TABLET | Freq: Four times a day (QID) | ORAL | 0 refills | Status: DC | PRN

## 2017-03-22 NOTE — Telephone Encounter (Signed)
Pt requesting refill of Hydrocodone 10-[facility] [place].

## 2017-03-29 ENCOUNTER — Telehealth: Payer: Self-pay | Admitting: Family Medicine

## 2017-03-29 MED ORDER — BENAZEPRIL-HYDROCHLOROTHIAZIDE 20-25 MG PO TABS: 1.0000 | ORAL_TABLET | Freq: Every day | ORAL | 3 refills | Status: DC

## 2017-03-29 NOTE — Telephone Encounter (Signed)
Patient called stating the Telmisartan-HCTZ 80/25 mg. Is not covered by his ins. And would like to get another BP that is covered. [facility]

## 2017-03-29 NOTE — Telephone Encounter (Signed)
Change to benazepril -hctz 20/25 one tablet daily, #30, rf x 3

## 2017-03-29 NOTE — Telephone Encounter (Signed)
Patient was advised. Expressed understanding. Rx was sent to pharmacy.

## 2017-03-29 NOTE — Telephone Encounter (Signed)
Please advise 

## 2017-04-05 ENCOUNTER — Telehealth: Payer: Self-pay | Admitting: Family Medicine

## 2017-04-05 DIAGNOSIS — G8929 Other chronic pain: Secondary | ICD-10-CM

## 2017-04-05 DIAGNOSIS — M5441 Lumbago with sciatica, right side: Principal | ICD-10-CM

## 2017-04-05 MED ORDER — HYDROCODONE-ACETAMINOPHEN 10-325 MG PO TABS: 1.0000 | ORAL_TABLET | Freq: Four times a day (QID) | ORAL | 0 refills | Status: DC | PRN

## 2017-04-05 NOTE — Telephone Encounter (Signed)
Patient is requesting refill on his HYDROcodone-acetaminophen (NORCO) 10-325 MG tablet

## 2017-04-08 ENCOUNTER — Encounter: Payer: Self-pay | Admitting: Family Medicine

## 2017-04-08 ENCOUNTER — Ambulatory Visit (INDEPENDENT_AMBULATORY_CARE_PROVIDER_SITE_OTHER): Payer: 59 | Admitting: Family Medicine

## 2017-04-08 VITALS — BP 128/84 | HR 77 | Temp 98.2°F | Resp 16 | Wt 168.0 lb

## 2017-04-08 DIAGNOSIS — M545 Low back pain, unspecified: Secondary | ICD-10-CM

## 2017-04-08 MED ORDER — PREDNISONE 10 MG PO TABS: ORAL_TABLET | ORAL | 0 refills | Status: AC

## 2017-04-08 NOTE — Patient Instructions (Signed)

## 2017-04-08 NOTE — Progress Notes (Signed)
Patient: Gary Carter Male    DOB: 05-17-1950   67 y.o.   MRN: [id number] Visit Date: 04/08/2017  Today's Provider: Lelon Huh, MD   Chief Complaint  Patient presents with  . Back Pain   Subjective:    HPI  Chronic bilateral low back pain with right-sided sciatica 12/14/2017-no changes were made at that time. States has been much worse the last 5 days. Pain is on both sides. Pain radiates into both left legs, Left > Right. Has long history of chronic back pain and is s/p lumbar fusion in 2005. Usually takes hydrocodone/apap three times, sometimes four times in a day, but has not been working well the last few days. He usually responds well to prednisone taper. He is urinating a little more frequently, but able to void and having no trouble controlling bowels or bladder.    Allergies  Allergen Reactions  . Hctz [Hydrochlorothiazide]     Leg cramps     Current Outpatient Medications:  .  amLODipine (NORVASC) 10 MG tablet, TAKE 1 TABLET BY MOUTH EVERY DAY, Disp: 30 tablet, Rfl: 12 .  aspirin 325 MG tablet, Take 325 mg by mouth once. , Disp: , Rfl:  .  benazepril-hydrochlorthiazide (LOTENSIN HCT) 20-25 MG tablet, Take 1 tablet by mouth daily., Disp: 30 tablet, Rfl: 3 .  buPROPion (WELLBUTRIN SR) 150 MG 12 hr tablet, Take 1 tablet (150 mg total) by mouth 2 (two) times daily., Disp: 3 tablet, Rfl: 0 .  Cholecalciferol (VITAMIN D3 PO), Take daily by mouth., Disp: , Rfl:  .  clonazePAM (KLONOPIN) 0.5 MG tablet, TAKE 1 TABLET BY MOUTH EVERY 8 HOURS AS NEEDED FOR ANXIETY, Disp: 90 tablet, Rfl: 4 .  cloNIDine (CATAPRES) 0.1 MG tablet, TAKE 1 TABLET BY MOUTH TWICE A DAY, Disp: 60 tablet, Rfl: 12 .  HYDROcodone-acetaminophen (NORCO) 10-325 MG tablet, Take 1 tablet by mouth every 6 (six) hours as needed., Disp: 60 tablet, Rfl: 0 .  metoprolol succinate (TOPROL-XL) 50 MG 24 hr tablet, Take 1 tablet (50 mg total) by mouth daily. In the evening, Disp: 90 tablet, Rfl: 3 .  omeprazole  (PRILOSEC) 40 MG capsule, TAKE ONE CAPSULE BY MOUTH EVERY DAY, Disp: 30 capsule, Rfl: 12 .  POTASSIUM PO, Take daily by mouth., Disp: , Rfl:  .  spironolactone (ALDACTONE) 25 MG tablet, Take 1 tablet (25 mg total) by mouth daily., Disp: 30 tablet, Rfl: 2 .  tadalafil (CIALIS) 20 MG tablet, Take 1 tablet (20 mg total) by mouth daily as needed for erectile dysfunction., Disp: 9 tablet, Rfl: 8 .  telmisartan-hydrochlorothiazide (MICARDIS HCT) 80-25 MG tablet, TAKE 1 TABLET BY MOUTH EVERY DAY, Disp: 30 tablet, Rfl: 5  Review of Systems  Constitutional: Negative for appetite change, chills and fever.  Respiratory: Negative for chest tightness, shortness of breath and wheezing.   Cardiovascular: Negative for chest pain and palpitations.  Gastrointestinal: Negative for abdominal pain, nausea and vomiting.    Social History   Tobacco Use  . Smoking status: Current Some Day Smoker    Packs/day: 0.50    Years: 5.00    Pack years: 2.50    Types: Cigarettes  . Smokeless tobacco: Never Used  . Tobacco comment: quit for 30 years  Substance Use Topics  . Alcohol use: Yes    Alcohol/week: 0.0 oz    Comment: drinks wine every night before bed   Objective:   BP 140/90 (BP Location: Right Arm, Patient Position: Sitting, Cuff Size:  Large)   Pulse 77   Temp 98.2 F (36.8 C) (Oral)   Resp 16   Wt 168 lb (76.2 kg)   SpO2 97%   BMI 25.36 kg/m  Vitals:   04/08/17 0859  BP: 140/90  Pulse: 77  Resp: 16  Temp: 98.2 F (36.8 C)  TempSrc: Oral  SpO2: 97%  Weight: 168 lb (76.2 kg)     Physical Exam  General appearance: alert, well developed, well nourished, cooperative and in no distress Head: Normocephalic, without obvious abnormality, atraumatic Respiratory: Respirations even and unlabored, normal respiratory rate MG:[id number] tender lumbar spine. No gross deformities. Normal bilateral muscle strength.      Assessment & Plan:     1. Low back pain potentially associated with  radiculopathy  - predniSONE (DELTASONE) 10 MG tablet; 6 tablets for 2 days, then 5 for 2 days, then 4 for 2 days, then 3 for 2 days, then 2 for 2 days, then 1 for 2 days.  Dispense: 42 tablet; Refill: 0  Call if symptoms change or if not rapidly improving.          Lelon Huh, MD  [facility]

## 2017-04-11 ENCOUNTER — Ambulatory Visit: Payer: Self-pay | Admitting: Family Medicine

## 2017-04-11 ENCOUNTER — Telehealth: Payer: Self-pay | Admitting: Family Medicine

## 2017-04-11 MED ORDER — CYCLOBENZAPRINE HCL 5 MG PO TABS: 5.0000 mg | ORAL_TABLET | Freq: Three times a day (TID) | ORAL | 1 refills | Status: DC | PRN

## 2017-04-11 NOTE — Telephone Encounter (Signed)
Pt requesting a muscle relaxer to help with is back.  Pt states he was seen last week for this issue.    [facility] [place]

## 2017-04-11 NOTE — Telephone Encounter (Signed)
Please advise 

## 2017-04-18 ENCOUNTER — Other Ambulatory Visit: Payer: Self-pay | Admitting: Family Medicine

## 2017-04-18 DIAGNOSIS — M5441 Lumbago with sciatica, right side: Principal | ICD-10-CM

## 2017-04-18 DIAGNOSIS — G8929 Other chronic pain: Secondary | ICD-10-CM

## 2017-04-18 NOTE — Telephone Encounter (Signed)
Pt needs refill on his hydrocodone 10-325  [facility] [place]  Pt's call back is [phone]  Thanks teri

## 2017-04-19 ENCOUNTER — Ambulatory Visit: Payer: Self-pay | Admitting: Family Medicine

## 2017-04-19 MED ORDER — HYDROCODONE-ACETAMINOPHEN 10-325 MG PO TABS: 1.0000 | ORAL_TABLET | Freq: Four times a day (QID) | ORAL | 0 refills | Status: DC | PRN

## 2017-04-21 ENCOUNTER — Ambulatory Visit (INDEPENDENT_AMBULATORY_CARE_PROVIDER_SITE_OTHER): Payer: 59 | Admitting: Family Medicine

## 2017-04-21 DIAGNOSIS — Z23 Encounter for immunization: Secondary | ICD-10-CM

## 2017-04-21 NOTE — Progress Notes (Signed)
Nurse visit only. Patient received second dose of Shingrix vaccine.

## 2017-05-03 ENCOUNTER — Other Ambulatory Visit: Payer: Self-pay | Admitting: Family Medicine

## 2017-05-04 ENCOUNTER — Other Ambulatory Visit: Payer: Self-pay | Admitting: Family Medicine

## 2017-05-04 DIAGNOSIS — G8929 Other chronic pain: Secondary | ICD-10-CM

## 2017-05-04 DIAGNOSIS — M5441 Lumbago with sciatica, right side: Principal | ICD-10-CM

## 2017-05-04 MED ORDER — HYDROCODONE-ACETAMINOPHEN 10-325 MG PO TABS: 1.0000 | ORAL_TABLET | Freq: Four times a day (QID) | ORAL | 0 refills | Status: DC | PRN

## 2017-05-04 NOTE — Telephone Encounter (Signed)
Pt contacted office for refill request on the following medications:  HYDROcodone-acetaminophen (NORCO) 10-325 MG tablet  [facility] [place]  Last Rx: 04/19/17 LOV: 04/08/17 Pt was advised Dr. Caryn Section is out of the office the rest of the week and pt request another provider fill medication. Please advise. Thanks TNP

## 2017-05-16 ENCOUNTER — Other Ambulatory Visit: Payer: Self-pay | Admitting: Family Medicine

## 2017-05-16 DIAGNOSIS — G8929 Other chronic pain: Secondary | ICD-10-CM

## 2017-05-16 DIAGNOSIS — M5441 Lumbago with sciatica, right side: Principal | ICD-10-CM

## 2017-05-16 NOTE — Telephone Encounter (Signed)
Pt needs refill hydrocodone 10-325  [facility] [place]  teri

## 2017-05-17 MED ORDER — HYDROCODONE-ACETAMINOPHEN 10-325 MG PO TABS: 1.0000 | ORAL_TABLET | Freq: Four times a day (QID) | ORAL | 0 refills | Status: DC | PRN

## 2017-05-23 ENCOUNTER — Other Ambulatory Visit: Payer: Self-pay | Admitting: Family Medicine

## 2017-05-31 ENCOUNTER — Other Ambulatory Visit: Payer: Self-pay | Admitting: Family Medicine

## 2017-05-31 DIAGNOSIS — M5441 Lumbago with sciatica, right side: Principal | ICD-10-CM

## 2017-05-31 DIAGNOSIS — G8929 Other chronic pain: Secondary | ICD-10-CM

## 2017-05-31 MED ORDER — HYDROCODONE-ACETAMINOPHEN 10-325 MG PO TABS
1.0000 | ORAL_TABLET | Freq: Four times a day (QID) | ORAL | 0 refills | Status: DC | PRN
Start: 2017-05-31 — End: 2017-06-20

## 2017-05-31 NOTE — Telephone Encounter (Signed)
Pt contacted office for refill request on the following medications:  HYDROcodone-acetaminophen ([place]) 10-325 MG tablet   [facility] [place]  Last Rx: 05/17/17 LOV: 04/08/17 Please advise. Thanks TNP

## 2017-06-20 ENCOUNTER — Other Ambulatory Visit: Payer: Self-pay | Admitting: Family Medicine

## 2017-06-20 DIAGNOSIS — M5441 Lumbago with sciatica, right side: Principal | ICD-10-CM

## 2017-06-20 DIAGNOSIS — G8929 Other chronic pain: Secondary | ICD-10-CM

## 2017-06-20 MED ORDER — HYDROCODONE-ACETAMINOPHEN 10-325 MG PO TABS: 1.0000 | ORAL_TABLET | Freq: Four times a day (QID) | ORAL | 0 refills | Status: DC | PRN

## 2017-06-20 NOTE — Telephone Encounter (Signed)
Pt contacted office for refill request on the following medications:  HYDROcodone-acetaminophen (NORCO) 10-325 MG tablet  [facility] [place]  Last Rx: 05/31/17 LOV: 04/08/17 Please advise. Thanks TNP

## 2017-07-06 ENCOUNTER — Other Ambulatory Visit: Payer: Self-pay | Admitting: Family Medicine

## 2017-07-06 DIAGNOSIS — G8929 Other chronic pain: Secondary | ICD-10-CM

## 2017-07-06 DIAGNOSIS — M5441 Lumbago with sciatica, right side: Principal | ICD-10-CM

## 2017-07-06 MED ORDER — HYDROCODONE-ACETAMINOPHEN 10-325 MG PO TABS: 1.0000 | ORAL_TABLET | Freq: Four times a day (QID) | ORAL | 0 refills | Status: DC | PRN

## 2017-07-06 NOTE — Telephone Encounter (Signed)
Pt needs a refill on his Hydrocodone 10-325  [facility] [place]  Thanks teri

## 2017-07-14 ENCOUNTER — Other Ambulatory Visit: Payer: Self-pay | Admitting: Family Medicine

## 2017-07-18 ENCOUNTER — Other Ambulatory Visit: Payer: Self-pay | Admitting: Family Medicine

## 2017-07-18 DIAGNOSIS — M5441 Lumbago with sciatica, right side: Principal | ICD-10-CM

## 2017-07-18 DIAGNOSIS — G8929 Other chronic pain: Secondary | ICD-10-CM

## 2017-07-18 NOTE — Telephone Encounter (Signed)
Pt needs refill on his pain medication hydrocodone 10-325  [facility]  [place]  Thanks  teri

## 2017-07-18 NOTE — Telephone Encounter (Signed)
Please advise 

## 2017-07-20 MED ORDER — HYDROCODONE-ACETAMINOPHEN 10-325 MG PO TABS: 1.0000 | ORAL_TABLET | Freq: Four times a day (QID) | ORAL | 0 refills | Status: DC | PRN

## 2017-07-26 ENCOUNTER — Ambulatory Visit (INDEPENDENT_AMBULATORY_CARE_PROVIDER_SITE_OTHER): Payer: Medicare Other | Admitting: Family Medicine

## 2017-07-26 ENCOUNTER — Encounter: Payer: Self-pay | Admitting: Family Medicine

## 2017-07-26 VITALS — BP 110/84 | HR 58 | Temp 98.1°F | Resp 16 | Ht 68.0 in | Wt 163.0 lb

## 2017-07-26 DIAGNOSIS — I10 Essential (primary) hypertension: Secondary | ICD-10-CM

## 2017-07-26 DIAGNOSIS — F339 Major depressive disorder, recurrent, unspecified: Secondary | ICD-10-CM | POA: Diagnosis not present

## 2017-07-26 DIAGNOSIS — R5383 Other fatigue: Secondary | ICD-10-CM

## 2017-07-26 MED ORDER — VORTIOXETINE HBR 10 MG PO TABS: 10.0000 mg | ORAL_TABLET | Freq: Every day | ORAL | 0 refills | Status: DC

## 2017-07-26 MED ORDER — METOPROLOL SUCCINATE ER 50 MG PO TB24: ORAL_TABLET | ORAL | 0 refills | Status: DC

## 2017-07-26 NOTE — Patient Instructions (Addendum)
   You can take OTC sublingual vitamin B12 1,000 units daily   Stop citalopram and take Trintellix once a day in its place   Reduce metoprolol to 1/2 (one-half) tablet every day

## 2017-07-26 NOTE — Progress Notes (Signed)
Patient: Gary Carter Male    DOB: 21-Jul-1950   67 y.o.   MRN: [id number] Visit Date: 07/26/2017  Today's Provider: Lelon Huh, MD   Chief Complaint  Patient presents with  . Fatigue   Subjective:    HPI  Patient states he has been feeling fatigued for the last month. He was been feeling tired all the time with no energy. He states has had not been taking clonidine for several months, but home blood pressures have been in the 110s to 120s. He is interested in b12 shots to help with energy. Doesn't really feel sleepy just fatigued. No change in sleeping habits.    States he wants to change citalopram due to side effects, particularly sexual side effects.   Allergies  Allergen Reactions  . Hctz [Hydrochlorothiazide]     Leg cramps     Current Outpatient Medications:  .  amLODipine (NORVASC) 10 MG tablet, TAKE 1 TABLET BY MOUTH EVERY DAY, Disp: 30 tablet, Rfl: 12 .  aspirin 325 MG tablet, Take 325 mg by mouth once. , Disp: , Rfl:  .  buPROPion (WELLBUTRIN SR) 150 MG 12 hr tablet, Take 1 tablet (150 mg total) by mouth 2 (two) times daily., Disp: 3 tablet, Rfl: 0 .  Cholecalciferol (VITAMIN D3 PO), Take daily by mouth., Disp: , Rfl:  .  citalopram (CELEXA) 40 MG tablet, TAKE 1 TABLET (40 MG TOTAL) DAILY BY MOUTH., Disp: 90 tablet, Rfl: 4 .  clonazePAM (KLONOPIN) 0.5 MG tablet, TAKE 1 TABLET BY MOUTH EVERY 8 HOURS AS NEEDED FOR ANXIETY, Disp: 90 tablet, Rfl: 4 .  HYDROcodone-acetaminophen (NORCO) 10-325 MG tablet, Take 1 tablet by mouth every 6 (six) hours as needed., Disp: 60 tablet, Rfl: 0 .  metoprolol succinate (TOPROL-XL) 50 MG 24 hr tablet, Take 1 tablet (50 mg total) by mouth daily. In the evening, Disp: 90 tablet, Rfl: 3 .  omeprazole (PRILOSEC) 40 MG capsule, TAKE ONE CAPSULE BY MOUTH EVERY DAY, Disp: 30 capsule, Rfl: 12 .  POTASSIUM PO, Take daily by mouth., Disp: , Rfl:  .  spironolactone (ALDACTONE) 25 MG tablet, TAKE 1 TABLET BY MOUTH EVERY DAY, Disp: 30  tablet, Rfl: 11 .  tadalafil (CIALIS) 20 MG tablet, Take 1 tablet (20 mg total) by mouth daily as needed for erectile dysfunction., Disp: 9 tablet, Rfl: 8 .  telmisartan-hydrochlorothiazide (MICARDIS HCT) 80-25 MG tablet, TAKE 1 TABLET BY MOUTH EVERY DAY, Disp: 30 tablet, Rfl: 5 .  cloNIDine (CATAPRES) 0.1 MG tablet, TAKE 1 TABLET BY MOUTH TWICE A DAY (Patient not taking: Reported on 07/26/2017), Disp: 60 tablet, Rfl: 12 .  cyclobenzaprine (FLEXERIL) 5 MG tablet, Take 1 tablet (5 mg total) by mouth 3 (three) times daily as needed for muscle spasms. (Patient not taking: Reported on 07/26/2017), Disp: 30 tablet, Rfl: 1  Review of Systems  Constitutional: Positive for fatigue. Negative for appetite change, chills and fever.  Respiratory: Negative for chest tightness, shortness of breath and wheezing.   Cardiovascular: Negative for chest pain and palpitations.  Gastrointestinal: Negative for abdominal pain, nausea and vomiting.    Social History   Tobacco Use  . Smoking status: Current Some Day Smoker    Packs/day: 0.50    Years: 5.00    Pack years: 2.50    Types: Cigarettes  . Smokeless tobacco: Never Used  . Tobacco comment: quit for 30 years  Substance Use Topics  . Alcohol use: Yes    Alcohol/week: 0.0 oz  Comment: drinks wine every night before bed   Objective:   BP 110/84 (BP Location: Right Arm, Patient Position: Sitting, Cuff Size: Normal)   Pulse (!) 58   Temp 98.1 F (36.7 C) (Oral)   Resp 16   Ht 5\' 8"  (1.727 m)   Wt 163 lb (73.9 kg)   SpO2 98%   BMI 24.78 kg/m  Vitals:   07/26/17 0936  BP: 110/84  Pulse: (!) 58  Resp: 16  Temp: 98.1 F (36.7 C)  TempSrc: Oral  SpO2: 98%  Weight: 163 lb (73.9 kg)  Height: 5\' 8"  (1.727 m)     Physical Exam   General Appearance:    Alert, cooperative, no distress  Eyes:    PERRL, conjunctiva/corneas clear, EOM's intact       Lungs:     Clear to auscultation bilaterally, respirations unlabored  Heart:    Regular rate and  rhythm  Neurologic:   Awake, alert, oriented x 3. No apparent focal neurological           defect.           Assessment & Plan:     1. Essential (primary) hypertension Is very well controlled, and meds may be contributing to fatigue. Reduce metoprolol to 1/2 tablet daily   2. Other fatigue Multifactorial. Can try OTC SLS b12. May improve with change in BP medications and antidepressant.s   3. Depression, recurrent ([place]) Having some sexual side effects from citalopram, change to  - vortioxetine HBr (TRINTELLIX) 10 MG TABS tablet; Take 1 tablet (10 mg total) by mouth daily.  Dispense: 28 tablet; Refill: 0  Follow up in august as scheduled.       Lelon Huh, MD  [facility]

## 2017-08-04 ENCOUNTER — Other Ambulatory Visit: Payer: Self-pay | Admitting: Family Medicine

## 2017-08-04 DIAGNOSIS — G8929 Other chronic pain: Secondary | ICD-10-CM

## 2017-08-04 DIAGNOSIS — M5441 Lumbago with sciatica, right side: Principal | ICD-10-CM

## 2017-08-04 MED ORDER — HYDROCODONE-ACETAMINOPHEN 10-325 MG PO TABS: 1.0000 | ORAL_TABLET | Freq: Four times a day (QID) | ORAL | 0 refills | Status: DC | PRN

## 2017-08-04 NOTE — Telephone Encounter (Signed)
Patient is requesting a refill on the following medication  HYDROcodone-acetaminophen (NORCO) 10-325 MG tablet  [facility] [place]

## 2017-08-09 ENCOUNTER — Telehealth: Payer: Self-pay | Admitting: Family Medicine

## 2017-08-09 MED ORDER — RABEPRAZOLE SODIUM 20 MG PO TBEC: 20.0000 mg | DELAYED_RELEASE_TABLET | Freq: Every day | ORAL | 3 refills | Status: DC

## 2017-08-09 NOTE — Telephone Encounter (Signed)
Pt called saying he has been having problems with reflux and is wanting to know if you will send something to the pharmacy  [facility] uses [facility] [place]  Pt's CB  [phone]  Thanks teri

## 2017-08-09 NOTE — Telephone Encounter (Signed)
Called patient back to confirm that he is taking omeprazole 40 mg. Patient states he is taking the medication but it is not helping with acid reflux. Patient states acid reflux has increased. Patient wants to try a different medication. Please advise?

## 2017-08-12 NOTE — Progress Notes (Signed)
Patient: Gary Carter Male    DOB: 1950-03-18   67 y.o.   MRN: [id number] Visit Date: 08/12/2017  Today's Provider: Lelon Huh, MD   No chief complaint on file.  Subjective:    HPI   Hypertension, follow-up:  BP Readings from Last 3 Encounters:  07/26/17 110/84  04/08/17 128/84  03/15/17 (!) 134/92    He was last seen for hypertension 5 months ago.  BP at that visit was 134/92. Management since that visit includes; labs checked, no changes. On 07/26/2017 reduced metoprolol to 1/2 tablet qd.He reports good compliance with treatment. He is not having side effects. none He is exercising. He is adherent to low salt diet.   Outside blood pressures are not checked lately. He is experiencing none.  Patient denies none.   Cardiovascular risk factors include advanced age (older than 17 for men, 54 for women).  Use of agents associated with hypertension: none.   ---------------------------------------------------------------    Depression, recurrent ([place]) From 07/26/2017-changed from citalopram to vortioxetine HBr (TRINTELLIX) 10 MG TABS tablet.  Patient states he thinks Trintellix needs to be increased to 20 mg. He feels that its working but not well enough.   Patient is also requesting a referral to GI to have his esophagus stretched. Patient states he has had this done 4 times in the past and feels he esophagus has tightened up again when he swallows food. Has improved since changing to current PPI, but still has symptoms most days.    Allergies  Allergen Reactions  . Hctz [Hydrochlorothiazide]     Leg cramps     Current Outpatient Medications:  .  amLODipine (NORVASC) 10 MG tablet, TAKE 1 TABLET BY MOUTH EVERY DAY, Disp: 30 tablet, Rfl: 12 .  aspirin 325 MG tablet, Take 325 mg by mouth once. , Disp: , Rfl:  .  buPROPion (WELLBUTRIN SR) 150 MG 12 hr tablet, Take 1 tablet (150 mg total) by mouth 2 (two) times daily., Disp: 3 tablet, Rfl: 0 .   Cholecalciferol (VITAMIN D3 PO), Take daily by mouth., Disp: , Rfl:  .  clonazePAM (KLONOPIN) 0.5 MG tablet, TAKE 1 TABLET BY MOUTH EVERY 8 HOURS AS NEEDED FOR ANXIETY, Disp: 90 tablet, Rfl: 4 .  cyclobenzaprine (FLEXERIL) 5 MG tablet, Take 1 tablet (5 mg total) by mouth 3 (three) times daily as needed for muscle spasms. (Patient not taking: Reported on 07/26/2017), Disp: 30 tablet, Rfl: 1 .  HYDROcodone-acetaminophen (NORCO) 10-325 MG tablet, Take 1 tablet by mouth every 6 (six) hours as needed., Disp: 60 tablet, Rfl: 0 .  metoprolol succinate (TOPROL-XL) 50 MG 24 hr tablet, Take 1/2 tablet every evening, Disp: 3 tablet, Rfl: 0 .  POTASSIUM PO, Take daily by mouth., Disp: , Rfl:  .  RABEprazole (ACIPHEX) 20 MG tablet, Take 1 tablet (20 mg total) by mouth daily. Take in place of omeprazole, Disp: 30 tablet, Rfl: 3 .  spironolactone (ALDACTONE) 25 MG tablet, TAKE 1 TABLET BY MOUTH EVERY DAY, Disp: 30 tablet, Rfl: 11 .  tadalafil (CIALIS) 20 MG tablet, Take 1 tablet (20 mg total) by mouth daily as needed for erectile dysfunction., Disp: 9 tablet, Rfl: 8 .  telmisartan-hydrochlorothiazide (MICARDIS HCT) 80-25 MG tablet, TAKE 1 TABLET BY MOUTH EVERY DAY, Disp: 30 tablet, Rfl: 5 .  vortioxetine HBr (TRINTELLIX) 10 MG TABS tablet, Take 1 tablet (10 mg total) by mouth daily., Disp: 28 tablet, Rfl: 0  Review of Systems  Constitutional: Negative for appetite  change, chills and fever.  Respiratory: Negative for chest tightness, shortness of breath and wheezing.   Cardiovascular: Negative for chest pain and palpitations.  Gastrointestinal: Negative for abdominal pain, nausea and vomiting.    Social History   Tobacco Use  . Smoking status: Current Some Day Smoker    Packs/day: 0.50    Years: 5.00    Pack years: 2.50    Types: Cigarettes  . Smokeless tobacco: Never Used  . Tobacco comment: quit for 30 years  Substance Use Topics  . Alcohol use: Yes    Alcohol/week: 0.0 oz    Comment: drinks wine  every night before bed   Objective:   BP 126/84 (BP Location: Right Arm, Patient Position: Sitting, Cuff Size: Normal)   Pulse 60   Temp 97.9 F (36.6 C) (Oral)   Resp 16   Ht 5\' 8"  (1.727 m)   Wt 162 lb (73.5 kg)   SpO2 97%   BMI 24.63 kg/m     Physical Exam   General Appearance:    Alert, cooperative, no distress  Eyes:    PERRL, conjunctiva/corneas clear, EOM's intact       Lungs:     Clear to auscultation bilaterally, respirations unlabored  Heart:    Regular rate and rhythm  Neurologic:   Awake, alert, oriented x 3. No apparent focal neurological           defect.           Assessment & Plan:     1. Dysphagia, unspecified type  - Ambulatory referral to Gastroenterology  2. Barrett's esophagus with dysplasia  - Ambulatory referral to Gastroenterology  3. Esophagitis, reflux  - Ambulatory referral to Gastroenterology  4. Depression, recurrent ([place]) Improved with change to Trintellix which will increase to 20mg  a day - vortioxetine HBr (TRINTELLIX) 20 MG TABS tablet; Take 1 tablet (20 mg total) by mouth daily.  Dispense: 30 tablet; Refill: 2  Return in about 3 months (around 11/15/2017) for depression.       Lelon Huh, MD  [facility]

## 2017-08-13 ENCOUNTER — Other Ambulatory Visit: Payer: Self-pay | Admitting: Family Medicine

## 2017-08-15 ENCOUNTER — Encounter: Payer: Self-pay | Admitting: Family Medicine

## 2017-08-15 ENCOUNTER — Ambulatory Visit (INDEPENDENT_AMBULATORY_CARE_PROVIDER_SITE_OTHER): Payer: Medicare Other | Admitting: Family Medicine

## 2017-08-15 VITALS — BP 126/84 | HR 60 | Temp 97.9°F | Resp 16 | Ht 68.0 in | Wt 162.0 lb

## 2017-08-15 DIAGNOSIS — R1310 Dysphagia, unspecified: Secondary | ICD-10-CM

## 2017-08-15 DIAGNOSIS — F339 Major depressive disorder, recurrent, unspecified: Secondary | ICD-10-CM

## 2017-08-15 DIAGNOSIS — K22719 Barrett's esophagus with dysplasia, unspecified: Secondary | ICD-10-CM

## 2017-08-15 DIAGNOSIS — K2100 Gastro-esophageal reflux disease with esophagitis, without bleeding: Secondary | ICD-10-CM

## 2017-08-15 MED ORDER — VORTIOXETINE HBR 20 MG PO TABS: 20.0000 mg | ORAL_TABLET | Freq: Every day | ORAL | 2 refills | Status: DC

## 2017-08-17 ENCOUNTER — Other Ambulatory Visit: Payer: Self-pay

## 2017-08-17 DIAGNOSIS — G8929 Other chronic pain: Secondary | ICD-10-CM

## 2017-08-17 DIAGNOSIS — M5441 Lumbago with sciatica, right side: Principal | ICD-10-CM

## 2017-08-19 ENCOUNTER — Other Ambulatory Visit: Payer: Self-pay

## 2017-08-19 ENCOUNTER — Telehealth: Payer: Self-pay

## 2017-08-19 MED ORDER — HYDROCODONE-ACETAMINOPHEN 10-325 MG PO TABS: 1.0000 | ORAL_TABLET | Freq: Four times a day (QID) | ORAL | 0 refills | Status: DC | PRN

## 2017-08-19 MED ORDER — VENLAFAXINE HCL ER 37.5 MG PO CP24: ORAL_CAPSULE | ORAL | 1 refills | Status: DC

## 2017-08-19 NOTE — Telephone Encounter (Signed)
Patient called requesting refills. He reports that he is almost out of the medication. Please refill. Thanks!

## 2017-08-19 NOTE — Telephone Encounter (Signed)
Patient was advised. Scheduled follow up for 09/16/2017 at 11:00 am.

## 2017-08-19 NOTE — Telephone Encounter (Signed)
Can try venlafaxine , [facility] [place]. Schedule follow up 4 weeks.

## 2017-08-19 NOTE — Telephone Encounter (Signed)
error 

## 2017-08-19 NOTE — Telephone Encounter (Signed)
  Patient can not take the Trintellix due to cost, Citalopram had too many side effects.  Would like something else called in.   Patient also said he called in last week to get his Hydrocodone refilled and has not gotten it yet.   [facility] [place]  CB [phone]

## 2017-08-23 ENCOUNTER — Other Ambulatory Visit: Payer: Self-pay | Admitting: Family Medicine

## 2017-08-28 ENCOUNTER — Other Ambulatory Visit: Payer: Self-pay | Admitting: Family Medicine

## 2017-09-01 ENCOUNTER — Other Ambulatory Visit: Payer: Self-pay | Admitting: Family Medicine

## 2017-09-01 DIAGNOSIS — M5441 Lumbago with sciatica, right side: Principal | ICD-10-CM

## 2017-09-01 DIAGNOSIS — G8929 Other chronic pain: Secondary | ICD-10-CM

## 2017-09-01 NOTE — Telephone Encounter (Signed)
Pt contacted office for refill request on the following medications:  HYDROcodone-acetaminophen ([place]) 10-325 MG tablet  [facility] [place]  Last Rx: 08/19/17 LOV: 08/15/17 Please advise. Thanks TNP

## 2017-09-02 MED ORDER — HYDROCODONE-ACETAMINOPHEN 10-325 MG PO TABS: 1.0000 | ORAL_TABLET | Freq: Four times a day (QID) | ORAL | 0 refills | Status: DC | PRN

## 2017-09-10 ENCOUNTER — Other Ambulatory Visit: Payer: Self-pay | Admitting: Family Medicine

## 2017-09-11 MED ORDER — VENLAFAXINE HCL ER 75 MG PO CP24: 75.0000 mg | ORAL_CAPSULE | Freq: Every day | ORAL | 3 refills | Status: DC

## 2017-09-11 NOTE — Addendum Note (Signed)
Addended by: Birdie Sons on: 09/11/2017 09:01 PM   Modules accepted: Orders

## 2017-09-16 ENCOUNTER — Ambulatory Visit: Payer: Self-pay | Admitting: Family Medicine

## 2017-09-16 DIAGNOSIS — Z23 Encounter for immunization: Secondary | ICD-10-CM | POA: Diagnosis not present

## 2017-09-20 ENCOUNTER — Ambulatory Visit (INDEPENDENT_AMBULATORY_CARE_PROVIDER_SITE_OTHER): Payer: Medicare Other | Admitting: Family Medicine

## 2017-09-20 ENCOUNTER — Encounter: Payer: Self-pay | Admitting: Family Medicine

## 2017-09-20 VITALS — BP 120/81 | HR 64 | Temp 97.6°F | Resp 16 | Ht 68.0 in | Wt 163.0 lb

## 2017-09-20 DIAGNOSIS — F339 Major depressive disorder, recurrent, unspecified: Secondary | ICD-10-CM

## 2017-09-20 DIAGNOSIS — N529 Male erectile dysfunction, unspecified: Secondary | ICD-10-CM

## 2017-09-20 MED ORDER — SILDENAFIL CITRATE 20 MG PO TABS: 20.0000 mg | ORAL_TABLET | Freq: Every day | ORAL | 2 refills | Status: DC

## 2017-09-20 NOTE — Progress Notes (Signed)
Patient: Gary Carter Male    DOB: July 02, 1950   67 y.o.   MRN: [id number] Visit Date: 09/20/2017  Today's Provider: Lelon Huh, MD   Chief Complaint  Patient presents with  . Follow-up  . Depression   Subjective:    HPI  Depression, recurrent ([place]) From 08/15/2017-increased TRINTELLIX to 20 MG qd. Changes since last ov: changed from Trintellix to Effexor due to cost on 08/19/2017. He stopped Effector due to it not being as effective as Trintellix, and started back on citalopram. He is now take 1/2 of 40mg  tablet daily and mood is much better. However he continues to have difficulty with ED since being on citalopram. He does take occasional 5-10mg  of Cialis.   Allergies  Allergen Reactions  . Hctz [Hydrochlorothiazide]     Leg cramps     Current Outpatient Medications:  .  amLODipine (NORVASC) 10 MG tablet, TAKE 1 TABLET BY MOUTH EVERY DAY, Disp: 90 tablet, Rfl: 4 .  aspirin 325 MG tablet, Take 325 mg by mouth once. , Disp: , Rfl:  .  Cholecalciferol (VITAMIN D3 PO), Take daily by mouth., Disp: , Rfl:  .  citalopram (CELEXA) 20 MG tablet, Take 20 mg by mouth. Taking 1/2 tablet daily, Disp: , Rfl:  .  citalopram (CELEXA) 40 MG tablet, Take 20 mg by mouth daily., Disp: , Rfl:  .  clonazePAM (KLONOPIN) 0.5 MG tablet, TAKE 1 TABLET BY MOUTH EVERY 8 HOURS AS NEEDED FOR ANXIETY, Disp: 90 tablet, Rfl: 4 .  Cyanocobalamin (VITAMIN B-12 PO), Take by mouth daily., Disp: , Rfl:  .  cyclobenzaprine (FLEXERIL) 5 MG tablet, Take 1 tablet (5 mg total) by mouth 3 (three) times daily as needed for muscle spasms., Disp: 30 tablet, Rfl: 1 .  HYDROcodone-acetaminophen (NORCO) 10-325 MG tablet, Take 1 tablet by mouth every 6 (six) hours as needed., Disp: 60 tablet, Rfl: 0 .  metoprolol succinate (TOPROL-XL) 50 MG 24 hr tablet, Take 1/2 tablet every evening, Disp: 3 tablet, Rfl: 0 .  omeprazole (PRILOSEC) 20 MG capsule, Please specify directions, refills and quantity, Disp: 30 capsule, Rfl:  12 .  POTASSIUM PO, Take daily by mouth., Disp: , Rfl:  .  spironolactone (ALDACTONE) 25 MG tablet, TAKE 1 TABLET BY MOUTH EVERY DAY, Disp: 30 tablet, Rfl: 11 .  tadalafil (CIALIS) 5 MG tablet, TAKE 1 TABLET (5 MG TOTAL) BY MOUTH DAILY AS NEEDED FOR ERECTILE DYSFUNCTION., Disp: 30 tablet, Rfl: 5 .  telmisartan-hydrochlorothiazide (MICARDIS HCT) 80-25 MG tablet, TAKE 1 TABLET BY MOUTH EVERY DAY, Disp: 30 tablet, Rfl: 5  Review of Systems  Constitutional: Negative for appetite change, chills and fever.  Respiratory: Negative for chest tightness, shortness of breath and wheezing.   Cardiovascular: Negative for chest pain and palpitations.  Gastrointestinal: Negative for abdominal pain, nausea and vomiting.    Social History   Tobacco Use  . Smoking status: Current Some Day Smoker    Packs/day: 0.50    Years: 5.00    Pack years: 2.50    Types: Cigarettes  . Smokeless tobacco: Never Used  . Tobacco comment: quit for 30 years  Substance Use Topics  . Alcohol use: Yes    Alcohol/week: 0.0 standard drinks    Comment: drinks wine every night before bed   Objective:   BP 120/81 (BP Location: Right Arm, Patient Position: Sitting, Cuff Size: Large)   Pulse 64   Temp 97.6 F (36.4 C) (Oral)   Resp 16  Ht 5\' 8"  (1.727 m)   Wt 163 lb (73.9 kg)   SpO2 97%   BMI 24.78 kg/m  Vitals:   09/20/17 0944  BP: 120/81  Pulse: 64  Resp: 16  Temp: 97.6 F (36.4 C)  TempSrc: Oral  SpO2: 97%  Weight: 163 lb (73.9 kg)  Height: 5\' 8"  (1.727 m)     Physical Exam   General Appearance:    Alert, cooperative, no distress  Eyes:    PERRL, conjunctiva/corneas clear, EOM's intact       Lungs:     Clear to auscultation bilaterally, respirations unlabored  Heart:    Regular rate and rhythm  Neurologic:   Awake, alert, oriented x 3. No apparent focal neurological           defect.           Assessment & Plan:     1. Depression, recurrent ([place]) Well controlled on citalopram, continue 20mg   day, but causing more difficulty with Ed.   2. Erectile dysfunction, unspecified erectile dysfunction type He states Cialis is too expensive to take every day. Will try daily  sildenafil (REVATIO) 20 MG tablet; Take 1-2 tablets (20-40 mg total) by mouth daily.  Dispense: 60 tablet; Refill: 2       Lelon Huh, MD  [facility]

## 2017-09-22 ENCOUNTER — Other Ambulatory Visit: Payer: Self-pay | Admitting: Family Medicine

## 2017-09-22 DIAGNOSIS — G8929 Other chronic pain: Secondary | ICD-10-CM

## 2017-09-22 DIAGNOSIS — M5441 Lumbago with sciatica, right side: Principal | ICD-10-CM

## 2017-09-22 MED ORDER — HYDROCODONE-ACETAMINOPHEN 10-325 MG PO TABS: 1.0000 | ORAL_TABLET | Freq: Four times a day (QID) | ORAL | 0 refills | Status: DC | PRN

## 2017-09-22 NOTE — Telephone Encounter (Signed)
Pt contacted office for refill request on the following medications:  HYDROcodone-acetaminophen ([place]) 10-325 MG tablet   Pt stated he saw Dr. Caryn Section for OV on 09/20/17 and had requested Rx be sent to [facility]. Pt stated Rx wasn't sent and is requesting it be sent today if possible.  Last Rx: 09/02/17 Please advise. Thanks TNP

## 2017-09-22 NOTE — Telephone Encounter (Signed)
Please review. Thanks!  

## 2017-09-24 ENCOUNTER — Other Ambulatory Visit: Payer: Self-pay | Admitting: Family Medicine

## 2017-09-24 DIAGNOSIS — I10 Essential (primary) hypertension: Secondary | ICD-10-CM

## 2017-10-06 ENCOUNTER — Other Ambulatory Visit: Payer: Self-pay | Admitting: Family Medicine

## 2017-10-06 DIAGNOSIS — M5441 Lumbago with sciatica, right side: Principal | ICD-10-CM

## 2017-10-06 DIAGNOSIS — G8929 Other chronic pain: Secondary | ICD-10-CM

## 2017-10-06 NOTE — Telephone Encounter (Signed)
Pt needing a refill on: ° °HYDROcodone-acetaminophen (NORCO) 10-325 MG tablet ° °Please fill at: °[id number] [place] [place] - [address] [phone] (Phone) °[phone] (Fax)  ° °Thanks, °[facility] ° °

## 2017-10-07 MED ORDER — HYDROCODONE-ACETAMINOPHEN 10-325 MG PO TABS: 1.0000 | ORAL_TABLET | Freq: Four times a day (QID) | ORAL | 0 refills | Status: DC | PRN

## 2017-10-12 ENCOUNTER — Other Ambulatory Visit: Payer: Self-pay | Admitting: Family Medicine

## 2017-10-12 DIAGNOSIS — N529 Male erectile dysfunction, unspecified: Secondary | ICD-10-CM

## 2017-10-24 ENCOUNTER — Other Ambulatory Visit: Payer: Self-pay | Admitting: Family Medicine

## 2017-10-24 DIAGNOSIS — G8929 Other chronic pain: Secondary | ICD-10-CM

## 2017-10-24 DIAGNOSIS — M5441 Lumbago with sciatica, right side: Principal | ICD-10-CM

## 2017-10-24 MED ORDER — HYDROCODONE-ACETAMINOPHEN 10-325 MG PO TABS: 1.0000 | ORAL_TABLET | Freq: Four times a day (QID) | ORAL | 0 refills | Status: DC | PRN

## 2017-10-24 NOTE — Telephone Encounter (Signed)
Pt needs refill on hydrocodone   [facility] [place]  CB# [phone]  Thanks Con Memos

## 2017-11-07 ENCOUNTER — Other Ambulatory Visit: Payer: Self-pay | Admitting: Family Medicine

## 2017-11-07 DIAGNOSIS — M5441 Lumbago with sciatica, right side: Principal | ICD-10-CM

## 2017-11-07 DIAGNOSIS — G8929 Other chronic pain: Secondary | ICD-10-CM

## 2017-11-07 MED ORDER — HYDROCODONE-ACETAMINOPHEN 10-325 MG PO TABS: 1.0000 | ORAL_TABLET | Freq: Four times a day (QID) | ORAL | 0 refills | Status: DC | PRN

## 2017-11-07 NOTE — Telephone Encounter (Signed)
Pt contacted office for refill request on the following medications:  HYDROcodone-acetaminophen (NORCO) 10-325 MG tablet  [facility] [place]  Last Rx: 10/24/17 LOV: 09/20/17 Please advise. Thanks TNP

## 2017-11-15 ENCOUNTER — Encounter: Payer: Self-pay | Admitting: Family Medicine

## 2017-11-15 ENCOUNTER — Ambulatory Visit (INDEPENDENT_AMBULATORY_CARE_PROVIDER_SITE_OTHER): Payer: Medicare Other | Admitting: Family Medicine

## 2017-11-15 VITALS — BP 114/78 | HR 76 | Temp 98.1°F | Resp 16 | Wt 173.0 lb

## 2017-11-15 DIAGNOSIS — G8929 Other chronic pain: Secondary | ICD-10-CM

## 2017-11-15 DIAGNOSIS — G4733 Obstructive sleep apnea (adult) (pediatric): Secondary | ICD-10-CM

## 2017-11-15 DIAGNOSIS — M5441 Lumbago with sciatica, right side: Secondary | ICD-10-CM | POA: Diagnosis not present

## 2017-11-15 DIAGNOSIS — F1190 Opioid use, unspecified, uncomplicated: Secondary | ICD-10-CM

## 2017-11-15 DIAGNOSIS — Z23 Encounter for immunization: Secondary | ICD-10-CM

## 2017-11-15 DIAGNOSIS — I10 Essential (primary) hypertension: Secondary | ICD-10-CM

## 2017-11-15 DIAGNOSIS — F339 Major depressive disorder, recurrent, unspecified: Secondary | ICD-10-CM

## 2017-11-15 DIAGNOSIS — F419 Anxiety disorder, unspecified: Secondary | ICD-10-CM | POA: Diagnosis not present

## 2017-11-15 NOTE — Progress Notes (Signed)
Patient: Gary Carter Male    DOB: 1950-10-20   67 y.o.   MRN: [id number] Visit Date: 11/15/2017  Today's Provider: Lelon Huh, MD   Chief Complaint  Patient presents with  . Hypertension  . Depression   Subjective:    Hypertension  This is a chronic problem. The problem is unchanged. The problem is controlled. Pertinent negatives include no anxiety, blurred vision, chest pain, headaches, malaise/fatigue, neck pain, orthopnea, palpitations, peripheral edema, PND, shortness of breath or sweats. There are no associated agents to hypertension. There are no compliance problems.   Depression         This is a chronic problem.The problem is unchanged (Pt reports he is doing well on the current dose of Citalopram).  Associated symptoms include no decreased concentration, no fatigue, no helplessness, no hopelessness, does not have insomnia, not irritable, no restlessness, no decreased interest, no appetite change, no body aches, no myalgias, no headaches, no indigestion, not sad and no suicidal ideas.  Compliance with treatment is good.  Previous treatment provided significant relief.   Pertinent negatives include no anxiety.  Follow up low back pain. He reports hydrocodone/apap continues to work well. Usually has to take three times in a day, occasionally fewer, rarely more. Pain occasionally radiates into leg. No LE weakness.     Allergies  Allergen Reactions  . Hctz [Hydrochlorothiazide]     Leg cramps     Current Outpatient Medications:  .  amLODipine (NORVASC) 10 MG tablet, TAKE 1 TABLET BY MOUTH EVERY DAY, Disp: 90 tablet, Rfl: 4 .  aspirin 325 MG tablet, Take 325 mg by mouth once. , Disp: , Rfl:  .  Cholecalciferol (VITAMIN D3 PO), Take daily by mouth., Disp: , Rfl:  .  citalopram (CELEXA) 40 MG tablet, Take 20 mg by mouth daily., Disp: , Rfl:  .  clonazePAM (KLONOPIN) 0.5 MG tablet, TAKE 1 TABLET BY MOUTH EVERY 8 HOURS AS NEEDED FOR ANXIETY, Disp: 90 tablet, Rfl: 4 .   Cyanocobalamin (VITAMIN B-12 PO), Take by mouth daily., Disp: , Rfl:  .  cyclobenzaprine (FLEXERIL) 5 MG tablet, Take 1 tablet (5 mg total) by mouth 3 (three) times daily as needed for muscle spasms., Disp: 30 tablet, Rfl: 1 .  HYDROcodone-acetaminophen (NORCO) 10-325 MG tablet, Take 1 tablet by mouth every 6 (six) hours as needed., Disp: 60 tablet, Rfl: 0 .  metoprolol succinate (TOPROL-XL) 50 MG 24 hr tablet, Take 1/2 tablet every evening, Disp: 3 tablet, Rfl: 0 .  omeprazole (PRILOSEC) 20 MG capsule, Please specify directions, refills and quantity, Disp: 30 capsule, Rfl: 12 .  POTASSIUM PO, Take daily by mouth., Disp: , Rfl:  .  sildenafil (REVATIO) 20 MG tablet, TAKE 1-2 TABLETS (20-40 MG TOTAL) BY MOUTH DAILY., Disp: 60 tablet, Rfl: 5 .  spironolactone (ALDACTONE) 25 MG tablet, TAKE 1 TABLET BY MOUTH EVERY DAY, Disp: 30 tablet, Rfl: 11 .  telmisartan-hydrochlorothiazide (MICARDIS HCT) 80-25 MG tablet, TAKE 1 TABLET BY MOUTH EVERY DAY, Disp: 90 tablet, Rfl: 3 .  citalopram (CELEXA) 20 MG tablet, Take 20 mg by mouth. Taking 1/2 tablet daily, Disp: , Rfl:   Review of Systems  Constitutional: Negative.  Negative for appetite change, fatigue and malaise/fatigue.  Eyes: Negative for blurred vision.  Respiratory: Negative.  Negative for shortness of breath.   Cardiovascular: Negative.  Negative for chest pain, palpitations, orthopnea and PND.  Gastrointestinal: Negative.   Musculoskeletal: Negative for myalgias and neck pain.  Neurological: Negative  for dizziness, light-headedness and headaches.  Psychiatric/Behavioral: Positive for depression. Negative for decreased concentration and suicidal ideas. The patient does not have insomnia.     Social History   Tobacco Use  . Smoking status: Current Some Day Smoker    Packs/day: 0.50    Years: 5.00    Pack years: 2.50    Types: Cigarettes  . Smokeless tobacco: Never Used  . Tobacco comment: quit for 30 years  Substance Use Topics  . Alcohol  use: Yes    Alcohol/week: 0.0 standard drinks    Comment: drinks wine every night before bed   Objective:   BP 114/78 (BP Location: Left Arm, Patient Position: Sitting, Cuff Size: Large)   Pulse 76   Temp 98.1 F (36.7 C) (Oral)   Resp 16   Wt 173 lb (78.5 kg)   BMI 26.30 kg/m  Vitals:   11/15/17 0813  BP: 114/78  Pulse: 76  Resp: 16  Temp: 98.1 F (36.7 C)  TempSrc: Oral  Weight: 173 lb (78.5 kg)      General Appearance:    Alert, cooperative, no distress  Eyes:    PERRL, conjunctiva/corneas clear, EOM's intact       Lungs:     Clear to auscultation bilaterally, respirations unlabored  Heart:    Regular rate and rhythm  Neurologic:   Awake, alert, oriented x 3. No apparent focal neurological           defect.   MS:   Tender over lumbar spine and para lumbar muscles, no gross deformities.          Assessment & Plan:     1. Anxiety disorder, unspecified type Doing well on current dose of citalopram.  2. Depression, recurrent ([place]) Well controlled.  Continue current medications.    3. Chronic, continuous use of opioids Doing well with current regiment of pain medications. Encourage regular exercise.  - [facility] ([facility]  4. Chronic bilateral low back pain with right-sided sciatica  5. Hypertension Well controlled.  Continue current medications.    6. Obstructive apnea Previously on CPAP, but has since lost about 30 pounds and feels he is resting well, feels well rested in the morning, and doesn't feel like CPAP is needed anymore.  7. Need for pneumococcal vaccination [id number] today.  Follow up for IPPE in a few months.        Lelon Huh, MD  [facility]

## 2017-11-16 LAB — PAIN MGT SCRN (14 DRUGS), UR
Amphetamine Scrn, Ur: NEGATIVE ng/mL
BARBITURATE SCREEN URINE: NEGATIVE ng/mL
BENZODIAZEPINE SCREEN, URINE: NEGATIVE ng/mL
Buprenorphine, Urine: NEGATIVE ng/mL
CANNABINOIDS UR QL SCN: NEGATIVE ng/mL
Cocaine (Metab) Scrn, Ur: NEGATIVE ng/mL
Creatinine(Crt), U: 87.3 mg/dL (ref 20.0–300.0)
Fentanyl, Urine: NEGATIVE pg/mL
Meperidine Screen, Urine: NEGATIVE ng/mL
Methadone Screen, Urine: NEGATIVE ng/mL
OXYCODONE+OXYMORPHONE UR QL SCN: NEGATIVE ng/mL
Opiate Scrn, Ur: POSITIVE ng/mL — AB
Ph of Urine: 6.2 (ref 4.5–8.9)
Phencyclidine Qn, Ur: NEGATIVE ng/mL
Propoxyphene Scrn, Ur: NEGATIVE ng/mL
Tramadol Screen, Urine: NEGATIVE ng/mL

## 2017-11-16 LAB — SPECIMEN STATUS REPORT

## 2017-11-22 ENCOUNTER — Other Ambulatory Visit: Payer: Self-pay

## 2017-11-22 DIAGNOSIS — M5441 Lumbago with sciatica, right side: Principal | ICD-10-CM

## 2017-11-22 DIAGNOSIS — G8929 Other chronic pain: Secondary | ICD-10-CM

## 2017-11-22 MED ORDER — HYDROCODONE-ACETAMINOPHEN 10-325 MG PO TABS: 1.0000 | ORAL_TABLET | Freq: Four times a day (QID) | ORAL | 0 refills | Status: DC | PRN

## 2017-12-07 ENCOUNTER — Other Ambulatory Visit: Payer: Self-pay

## 2017-12-07 DIAGNOSIS — G4700 Insomnia, unspecified: Secondary | ICD-10-CM

## 2017-12-07 DIAGNOSIS — M5441 Lumbago with sciatica, right side: Principal | ICD-10-CM

## 2017-12-07 DIAGNOSIS — G8929 Other chronic pain: Secondary | ICD-10-CM

## 2017-12-07 MED ORDER — HYDROCODONE-ACETAMINOPHEN 10-325 MG PO TABS: 1.0000 | ORAL_TABLET | Freq: Four times a day (QID) | ORAL | 0 refills | Status: DC | PRN

## 2017-12-07 MED ORDER — CLONAZEPAM 0.5 MG PO TABS: 0.5000 mg | ORAL_TABLET | Freq: Three times a day (TID) | ORAL | 4 refills | Status: DC | PRN

## 2017-12-19 ENCOUNTER — Other Ambulatory Visit: Payer: Self-pay | Admitting: Family Medicine

## 2017-12-19 DIAGNOSIS — G8929 Other chronic pain: Secondary | ICD-10-CM

## 2017-12-19 DIAGNOSIS — M5441 Lumbago with sciatica, right side: Principal | ICD-10-CM

## 2017-12-19 MED ORDER — HYDROCODONE-ACETAMINOPHEN 10-325 MG PO TABS: 1.0000 | ORAL_TABLET | Freq: Four times a day (QID) | ORAL | 0 refills | Status: DC | PRN

## 2017-12-19 NOTE — Telephone Encounter (Signed)
Pt requesting refill of HYDROcodone-acetaminophen (NORCO) 10-[facility] [place]

## 2018-01-05 ENCOUNTER — Other Ambulatory Visit: Payer: Self-pay

## 2018-01-05 DIAGNOSIS — G8929 Other chronic pain: Secondary | ICD-10-CM

## 2018-01-05 DIAGNOSIS — M5441 Lumbago with sciatica, right side: Principal | ICD-10-CM

## 2018-01-05 MED ORDER — HYDROCODONE-ACETAMINOPHEN 10-325 MG PO TABS: 1.0000 | ORAL_TABLET | Freq: Four times a day (QID) | ORAL | 0 refills | Status: DC | PRN

## 2018-01-17 ENCOUNTER — Other Ambulatory Visit: Payer: Self-pay | Admitting: Family Medicine

## 2018-01-17 DIAGNOSIS — G8929 Other chronic pain: Secondary | ICD-10-CM

## 2018-01-17 DIAGNOSIS — M5441 Lumbago with sciatica, right side: Principal | ICD-10-CM

## 2018-01-17 NOTE — Telephone Encounter (Signed)
Pt needing a refill on:  HYDROcodone-acetaminophen (NORCO) 10-325 MG tablet  [facility] fill at: [id number] [place] [place] [phone] (Phone) [phone] (Fax)   Thanks, [employer]

## 2018-01-19 MED ORDER — HYDROCODONE-ACETAMINOPHEN 10-325 MG PO TABS: 1.0000 | ORAL_TABLET | Freq: Four times a day (QID) | ORAL | 0 refills | Status: DC | PRN

## 2018-01-25 ENCOUNTER — Other Ambulatory Visit: Payer: Self-pay | Admitting: Family Medicine

## 2018-01-25 DIAGNOSIS — G4700 Insomnia, unspecified: Secondary | ICD-10-CM

## 2018-01-25 MED ORDER — CLONAZEPAM 0.5 MG PO TABS: 0.5000 mg | ORAL_TABLET | Freq: Three times a day (TID) | ORAL | 4 refills | Status: DC | PRN

## 2018-01-25 NOTE — Telephone Encounter (Signed)
Patient states that the pharmacy said they have no record of an Rx being called in on 12/07/17 for Clonazepam.  Patient states he has been without it since then.  He said he didn't call prior to this because he thought you weren't going to give it to him anymore.   However, he is calling today to get Rx.  Call back number [phone]

## 2018-02-01 ENCOUNTER — Other Ambulatory Visit: Payer: Self-pay | Admitting: Family Medicine

## 2018-02-01 DIAGNOSIS — M5441 Lumbago with sciatica, right side: Principal | ICD-10-CM

## 2018-02-01 DIAGNOSIS — G8929 Other chronic pain: Secondary | ICD-10-CM

## 2018-02-01 MED ORDER — HYDROCODONE-ACETAMINOPHEN 10-325 MG PO TABS: 1.0000 | ORAL_TABLET | Freq: Four times a day (QID) | ORAL | 0 refills | Status: DC | PRN

## 2018-02-01 NOTE — Telephone Encounter (Signed)
Pt needing refill on: HYDROcodone-acetaminophen (NORCO) 10-325 MG tablet  Please fill at:  [id number] [place], [place] [phone] (Phone) [phone] (Fax)   Thanks, [employer]

## 2018-02-14 ENCOUNTER — Encounter: Payer: Self-pay | Admitting: Family Medicine

## 2018-02-14 ENCOUNTER — Ambulatory Visit (INDEPENDENT_AMBULATORY_CARE_PROVIDER_SITE_OTHER): Payer: Medicare Other | Admitting: Family Medicine

## 2018-02-14 VITALS — BP 140/88 | HR 82 | Temp 98.1°F | Resp 16 | Ht 68.0 in | Wt 174.0 lb

## 2018-02-14 DIAGNOSIS — Z125 Encounter for screening for malignant neoplasm of prostate: Secondary | ICD-10-CM

## 2018-02-14 DIAGNOSIS — I10 Essential (primary) hypertension: Secondary | ICD-10-CM

## 2018-02-14 DIAGNOSIS — M5441 Lumbago with sciatica, right side: Secondary | ICD-10-CM | POA: Diagnosis not present

## 2018-02-14 DIAGNOSIS — F419 Anxiety disorder, unspecified: Secondary | ICD-10-CM

## 2018-02-14 DIAGNOSIS — Z0000 Encounter for general adult medical examination without abnormal findings: Secondary | ICD-10-CM

## 2018-02-14 DIAGNOSIS — G4733 Obstructive sleep apnea (adult) (pediatric): Secondary | ICD-10-CM

## 2018-02-14 DIAGNOSIS — E785 Hyperlipidemia, unspecified: Secondary | ICD-10-CM | POA: Diagnosis not present

## 2018-02-14 DIAGNOSIS — G8929 Other chronic pain: Secondary | ICD-10-CM

## 2018-02-14 MED ORDER — HYDROCODONE-ACETAMINOPHEN 10-325 MG PO TABS: 1.0000 | ORAL_TABLET | Freq: Four times a day (QID) | ORAL | 0 refills | Status: DC | PRN

## 2018-02-14 NOTE — Progress Notes (Signed)
Patient: Gary Carter, Male    DOB: 06-10-1950, 68 y.o.   MRN: [id number] Visit Date: 02/14/2018  Today's Provider: Lelon Huh, MD   Chief Complaint  Patient presents with  . Medicare Wellness  . Anxiety  . Depression  . Hypertension  . Back Pain   Subjective:    Initial preventative physical exam Gary Carter is a 68 y.o. male who presents today for his Initial Preventative Physical Exam. He feels fairly well. He reports exercising daily. He reports he is sleeping fairly well.   Follow up for Anxiety:  The patient was last seen for this 3 months ago. Changes made at last visit include none.  He reports good compliance with treatment. He feels that condition is Improved. He is not having side effects.   ------------------------------------------------------------------------------------  Follow up for Depression:  The patient was last seen for this 3 months ago. Changes made at last visit include none.  He reports good compliance with treatment. He feels that condition is Improved. He is not having side effects.   ------------------------------------------------------------------------------------  Follow up for Chronic bilateral low back pain:  The patient was last seen for this 3 months ago. Changes made at last visit include none.  He reports good compliance with treatment. He feels that condition is stable. He is not having side effects.   ------------------------------------------------------------------------------------  Hypertension, follow-up:  BP Readings from Last 3 Encounters:  02/14/18 140/88  11/15/17 114/78  09/20/17 120/81    He was last seen for hypertension 3 months ago.  BP at that visit was 114/78. Management since that visit includes no changes. He reports good compliance with treatment. He is not having side effects.  He is exercising. He is not adherent to low salt diet.   Outside blood pressures are not  checked. He is experiencing none.  Patient denies chest pain, chest pressure/discomfort, claudication, dyspnea, exertional chest pressure/discomfort, fatigue, irregular heart beat, lower extremity edema, near-syncope, orthopnea, palpitations, paroxysmal nocturnal dyspnea, syncope and tachypnea.   Cardiovascular risk factors include advanced age (older than 48 for men, 59 for women), hypertension and male gender.  Use of agents associated with hypertension: NSAIDS.     Weight trend: stable Wt Readings from Last 3 Encounters:  02/14/18 174 lb (78.9 kg)  11/15/17 173 lb (78.5 kg)  09/20/17 163 lb (73.9 kg)    Current diet: well balanced  ------------------------------------------------------------------------  Review of Systems  Constitutional: Negative for appetite change, chills, fatigue and fever.  HENT: Negative for congestion, ear pain, hearing loss, nosebleeds and trouble swallowing.   Eyes: Negative for pain and visual disturbance.  Respiratory: Negative for cough, chest tightness and shortness of breath.   Cardiovascular: Negative for chest pain, palpitations and leg swelling.  Gastrointestinal: Negative for abdominal pain, blood in stool, constipation, diarrhea, nausea and vomiting.  Endocrine: Negative for polydipsia, polyphagia and polyuria.  Genitourinary: Positive for penile pain (burning during ejaculation). Negative for dysuria and flank pain.  Musculoskeletal: Negative for arthralgias, back pain, joint swelling, myalgias and neck stiffness.  Skin: Negative for color change, rash and wound.  Neurological: Negative for dizziness, tremors, seizures, speech difficulty, weakness, light-headedness and headaches.  Psychiatric/Behavioral: Negative for behavioral problems, confusion, decreased concentration, dysphoric mood and sleep disturbance. The patient is not nervous/anxious.   All other systems reviewed and are negative.   Social History   Socioeconomic History  .  Marital status: Divorced    Spouse name: Not on file  . Number of children:  1  . Years of education: Not on file  . Highest education level: Not on file  Occupational History  . Occupation: Works at [employer]  . Financial resource strain: Not on file  . Food insecurity:    Worry: Not on file    Inability: Not on file  . Transportation needs:    Medical: Not on file    Non-medical: Not on file  Tobacco Use  . Smoking status: Current Some Day Smoker    Packs/day: 0.50    Years: 5.00    Pack years: 2.50    Types: Cigarettes  . Smokeless tobacco: Never Used  . Tobacco comment: quit for 30 years  Substance and Sexual Activity  . Alcohol use: Yes    Alcohol/week: 0.0 standard drinks    Comment: drinks wine every night before bed  . Drug use: No    Comment: previously smoked marijuana years ago  . Sexual activity: Not on file  Lifestyle  . Physical activity:    Days per week: Not on file    Minutes per session: Not on file  . Stress: Not on file  Relationships  . Social connections:    Talks on phone: Not on file    Gets together: Not on file    Attends religious service: Not on file    Active member of club or organization: Not on file    Attends meetings of clubs or organizations: Not on file    Relationship status: Not on file  . Intimate partner violence:    Fear of current or ex partner: Not on file    Emotionally abused: Not on file    Physically abused: Not on file    Forced sexual activity: Not on file  Other Topics Concern  . Not on file  Social History Narrative  . Not on file    Past Medical History:  Diagnosis Date  . GERD (gastroesophageal reflux disease)   . History of chicken pox 06/12/2014   DID have Chicken Pox. DID have Measles. DID have Mumps. DID have Rubella.    . History of measles   . History of mumps   . History of rubella   . Sleep apnea      Patient Active Problem List   Diagnosis Date Noted  . Chronic, continuous use of  opioids 11/15/2017  . History of kidney stones 05/04/2016  . Bunion 06/12/2014  . Insomnia 06/12/2014  . Panic attack 06/12/2014  . Obstructive apnea 06/12/2014  . Anxiety disorder 06/11/2014  . H/O suicide attempt 05/28/2010  . Esophagitis, reflux 05/15/2008  . Actinic keratoses 05/06/2008  . 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 Allergic rhinitis 03/08/2007  . Arthralgia of hand 12/14/2006  . HLD (hyperlipidemia) 08/15/2006  . Family history of coronary arteriosclerosis 08/13/2006  . 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 Chronic low back pain 12/07/2005  . Depression, recurrent ([place]) 12/05/2005  . Barrett esophagus 02/06/1997  . Essential (primary) hypertension 02/06/1997  . Current smoker 01/11/1958    Past Surgical History:  Procedure Laterality Date  . ABDOMINAL SURGERY    . BACK SURGERY  12-04-2003   Lumbar fusion & C5-C6 fusion  . HERNIA REPAIR    . Myocardial perfusion scan  10/10/2006  . NECK SURGERY    . NERVE, TENDON AND ARTERY REPAIR Left 10/18/2012   Procedure: NERVE, TENDON AND ARTERY REPAIR;  Surgeon: Linna Hoff, MD;  Location: [facility];  Service: Orthopedics;  Laterality: Left;  .  NISSEN FUNDOPLICATION  8101  . UPPER GI ENDOSCOPY  04/09/2007   Dr. Tiffany Kocher; Mucosal changes suspicious for Barretts esophagits  . WOUND EXPLORATION Left 10/18/2012   Procedure: WOUND EXPLORATION;  Surgeon: Linna Hoff, MD;  Location: [facility];  Service: Orthopedics;  Laterality: Left;    His family history includes Bipolar disorder in his brother and sister; Diabetes (age of onset: 62) in his daughter; Emphysema in his mother; Hypertension in his brother and sister; Obesity in his brother.   Current Outpatient Medications:  .  amLODipine (NORVASC) 10 MG tablet, TAKE 1 TABLET BY MOUTH EVERY DAY, Disp: 90 tablet, Rfl: 4 .  aspirin 325 MG tablet, Take 325 mg by mouth once. , Disp: , Rfl:  .  Cholecalciferol (VITAMIN D3 PO), Take daily by mouth., Disp: , Rfl:  .   citalopram (CELEXA) 40 MG tablet, Take 20 mg by mouth daily., Disp: , Rfl:  .  clonazePAM (KLONOPIN) 0.5 MG tablet, Take 1 tablet (0.5 mg total) by mouth every 8 (eight) hours as needed. for anxiety, Disp: 90 tablet, Rfl: 4 .  Cyanocobalamin (VITAMIN B-12 PO), Take by mouth daily., Disp: , Rfl:  .  cyclobenzaprine (FLEXERIL) 5 MG tablet, Take 1 tablet (5 mg total) by mouth 3 (three) times daily as needed for muscle spasms., Disp: 30 tablet, Rfl: 1 .  HYDROcodone-acetaminophen (NORCO) 10-325 MG tablet, Take 1 tablet by mouth every 6 (six) hours as needed., Disp: 60 tablet, Rfl: 0 .  metoprolol succinate (TOPROL-XL) 50 MG 24 hr tablet, Take 1/2 tablet every evening, Disp: 3 tablet, Rfl: 0 .  omeprazole (PRILOSEC) 20 MG capsule, Please specify directions, refills and quantity, Disp: 30 capsule, Rfl: 12 .  POTASSIUM PO, Take daily by mouth., Disp: , Rfl:  .  sildenafil (REVATIO) 20 MG tablet, TAKE 1-2 TABLETS (20-40 MG TOTAL) BY MOUTH DAILY., Disp: 60 tablet, Rfl: 5 .  spironolactone (ALDACTONE) 25 MG tablet, TAKE 1 TABLET BY MOUTH EVERY DAY, Disp: 30 tablet, Rfl: 11 .  telmisartan-hydrochlorothiazide (MICARDIS HCT) 80-25 MG tablet, TAKE 1 TABLET BY MOUTH EVERY DAY, Disp: 90 tablet, Rfl: 3   Patient Care Team: Birdie Sons, MD as PCP - General (Family Medicine)   Objective:    Vitals: BP 140/88 (BP Location: Left Arm, Patient Position: Sitting, Cuff Size: Large)   Pulse 82   Temp 98.1 F (36.7 C) (Oral)   Resp 16   Ht 5\' 8"  (1.727 m)   Wt 174 lb (78.9 kg)   SpO2 98% Comment: room air  BMI 26.46 kg/m   Physical Exam   General Appearance:    Alert, cooperative, no distress  Eyes:    PERRL, conjunctiva/corneas clear, EOM's intact       Lungs:     Clear to auscultation bilaterally, respirations unlabored  Heart:    Regular rate and rhythm  Neurologic:   Awake, alert, oriented x 3. No apparent focal neurological           defect.         Visual Acuity Screening   Right eye Left eye  Both eyes  Without correction:     With correction: 20/50 20/13 20/13   Comments: Patient saw all colors   Activities of Daily Living In your present state of health, do you have any difficulty performing the following activities: 02/14/2018  Hearing? N  Vision? N  Difficulty concentrating or making decisions? N  Walking or climbing stairs? N  Dressing or bathing? N  Doing errands, shopping?  N  Some recent data might be hidden    Fall Risk Assessment Fall Risk  02/14/2018 07/26/2017  Falls in the past year? 0 No  Number falls in past yr: 0 -  Injury with Fall? 0 -  Follow up Falls evaluation completed -     Depression Screen PHQ 2/9 Scores 02/14/2018 02/07/2017 03/08/2016  PHQ - 2 Score 0 0 5  PHQ- 9 Score 1 5 18     No flowsheet data found.    Assessment & Plan:     Initial Preventative Physical Exam  Reviewed patient's Family Medical History Reviewed and updated list of patient's medical providers Assessment of cognitive impairment was done Assessed patient's functional ability Established a written schedule for health screening [place] Completed and Reviewed  Exercise Activities and Dietary recommendations Goals   None     Immunization History  Administered Date(s) Administered  . Influenza Split 10/08/2011  . Influenza, High Dose Seasonal PF 09/16/2017  . Influenza,inj,Quad PF,6+ Mos 10/09/2013, 09/14/2016  . Influenza-Unspecified 10/29/2014, 09/16/2017  . Pneumococcal Conjugate-13 11/15/2017  . Pneumococcal Polysaccharide-23 10/08/2011  . Tdap 10/08/2011  . Zoster 10/09/2013  . Zoster Recombinat (Shingrix) 02/07/2017, 04/21/2017    Health Maintenance  Topic Date Due  . HIV Screening  07/11/1967  . PNA vac Low Risk Adult (2 of 2 - PPSV23) 11/16/2018  . COLONOSCOPY  06/17/2021  . TETANUS/TDAP  10/07/2021  . INFLUENZA VACCINE  Completed  . Hepatitis C Screening  Completed     Discussed health benefits of physical activity, and  encouraged him to engage in regular exercise appropriate for his age and condition.    ------------------------------------------------------------------------------------------------------------  1. Welcome to Medicare preventive visit  - EKG 12-Lead  2. Essential (primary) hypertension Well controlled.  Continue current medications.   - EKG 12-Lead - Comprehensive metabolic panel  3. Hyperlipidemia, unspecified hyperlipidemia type Diet controlled.  - Comprehensive metabolic panel - Lipid panel  4. Obstructive apnea Off CPAP since losing weight. Currently without symptoms.   5. Anxiety disorder, unspecified type Well controlled with occasional clonazepam.   6. Prostate cancer screening  - PSA  7. Chronic bilateral low back pain with right-sided sciatica Counseled  Regarding potential interaction of bz and opioids. He will not take medications together. refill- HYDROcodone-acetaminophen (NORCO) 10-325 MG tablet; Take 1 tablet by mouth every 6 (six) hours as needed.  Dispense: 60 tablet; Refill: 0    Lelon Huh, MD  [facility]

## 2018-02-14 NOTE — Patient Instructions (Addendum)
.   Please review the attached list of medications and notify my office if there are any errors.   . Please bring all of your medications to every appointment so we can make sure that our medication list is the same as yours.   . Please go to the lab draw station in [address] on the second floor of [facility]. Normal hours are 8:00am to 12:30pm and 1:30pm to 4:00pm Monday through Friday   Please contact your eyecare professional to schedule a routine eye exam   Please stop smoking

## 2018-02-15 ENCOUNTER — Other Ambulatory Visit: Payer: Self-pay | Admitting: Family Medicine

## 2018-02-15 DIAGNOSIS — Z125 Encounter for screening for malignant neoplasm of prostate: Secondary | ICD-10-CM | POA: Diagnosis not present

## 2018-02-15 DIAGNOSIS — E785 Hyperlipidemia, unspecified: Secondary | ICD-10-CM | POA: Diagnosis not present

## 2018-02-15 DIAGNOSIS — I10 Essential (primary) hypertension: Secondary | ICD-10-CM

## 2018-02-16 LAB — COMPREHENSIVE METABOLIC PANEL
ALT: 20 IU/L (ref 0–44)
AST: 18 IU/L (ref 0–40)
Albumin/Globulin Ratio: 2 (ref 1.2–2.2)
Albumin: 4.5 g/dL (ref 3.8–4.8)
Alkaline Phosphatase: 53 IU/L (ref 39–117)
BUN/Creatinine Ratio: 18 (ref 10–24)
BUN: 14 mg/dL (ref 8–27)
Bilirubin Total: 0.7 mg/dL (ref 0.0–1.2)
CO2: 24 mmol/L (ref 20–29)
Calcium: 9.6 mg/dL (ref 8.6–10.2)
Chloride: 98 mmol/L (ref 96–106)
Creatinine, Ser: 0.76 mg/dL (ref 0.76–1.27)
GFR calc Af Amer: 111 mL/min/{1.73_m2} (ref >59)
GFR calc non Af Amer: 96 mL/min/{1.73_m2} (ref >59)
Globulin, Total: 2.2 g/dL (ref 1.5–4.5)
Glucose: 109 mg/dL — ABNORMAL HIGH (ref 65–99)
Potassium: 4.2 mmol/L (ref 3.5–5.2)
Sodium: 140 mmol/L (ref 134–144)
Total Protein: 6.7 g/dL (ref 6.0–8.5)

## 2018-02-16 LAB — LIPID PANEL
Chol/HDL Ratio: 3.1 ratio (ref 0.0–5.0)
Cholesterol, Total: 188 mg/dL (ref 100–199)
HDL: 61 mg/dL (ref >39)
LDL Calculated: 110 mg/dL — ABNORMAL HIGH (ref 0–99)
Triglycerides: 85 mg/dL (ref 0–149)
VLDL Cholesterol Cal: 17 mg/dL (ref 5–40)

## 2018-02-16 LAB — PSA: Prostate Specific Ag, Serum: 1.5 ng/mL (ref 0.0–4.0)

## 2018-02-20 ENCOUNTER — Telehealth: Payer: Self-pay

## 2018-02-20 MED ORDER — PRAVASTATIN SODIUM 40 MG PO TABS: 40.0000 mg | ORAL_TABLET | Freq: Every day | ORAL | 3 refills | Status: DC

## 2018-02-20 NOTE — Telephone Encounter (Signed)
-----   Message from Birdie Sons, MD sent at 02/16/2018  8:01 AM EST ----- Cholesterol is a little 188, considering his history of high blood pressure and smoking, he should get his cholesterol under 140 with a statin medication to prevent heart disease. Recommend he start pravastatin 40mg  once a day, #30, rf x 3. Schedule follow up 3-4 months.  Rest of labs are normal.

## 2018-02-20 NOTE — Telephone Encounter (Signed)
Pt advised.  [facility] to [facility] [place].  Apt made for 06/19/2018 at 8am.  Thanks,   -Mickel Baas

## 2018-02-28 ENCOUNTER — Other Ambulatory Visit: Payer: Self-pay | Admitting: Family Medicine

## 2018-02-28 DIAGNOSIS — M5441 Lumbago with sciatica, right side: Principal | ICD-10-CM

## 2018-02-28 DIAGNOSIS — G8929 Other chronic pain: Secondary | ICD-10-CM

## 2018-02-28 NOTE — Telephone Encounter (Signed)
Pt needs refill on his  Hydrocodone 10-325  [facility] [place]  thanks teri

## 2018-03-01 MED ORDER — HYDROCODONE-ACETAMINOPHEN 10-325 MG PO TABS: 1.0000 | ORAL_TABLET | Freq: Four times a day (QID) | ORAL | 0 refills | Status: DC | PRN

## 2018-03-15 ENCOUNTER — Other Ambulatory Visit: Payer: Self-pay | Admitting: Family Medicine

## 2018-03-15 DIAGNOSIS — G8929 Other chronic pain: Secondary | ICD-10-CM

## 2018-03-15 DIAGNOSIS — M5441 Lumbago with sciatica, right side: Principal | ICD-10-CM

## 2018-03-15 MED ORDER — HYDROCODONE-ACETAMINOPHEN 10-325 MG PO TABS: 1.0000 | ORAL_TABLET | Freq: Four times a day (QID) | ORAL | 0 refills | Status: DC | PRN

## 2018-03-15 NOTE — Telephone Encounter (Signed)
Pt needs refill  ° °Hydrocodone 10-325 ° °[facility] [place] ° °Thanks teri °

## 2018-03-17 ENCOUNTER — Encounter: Payer: Self-pay | Admitting: Family Medicine

## 2018-03-17 ENCOUNTER — Ambulatory Visit (INDEPENDENT_AMBULATORY_CARE_PROVIDER_SITE_OTHER): Payer: Medicare Other | Admitting: Family Medicine

## 2018-03-17 VITALS — BP 120/84 | HR 64 | Temp 97.8°F | Resp 16 | Wt 174.0 lb

## 2018-03-17 DIAGNOSIS — J320 Chronic maxillary sinusitis: Secondary | ICD-10-CM | POA: Diagnosis not present

## 2018-03-17 MED ORDER — AMOXICILLIN 875 MG PO TABS: 875.0000 mg | ORAL_TABLET | Freq: Two times a day (BID) | ORAL | 0 refills | Status: DC

## 2018-03-17 NOTE — Progress Notes (Signed)
Patient: Gary Carter Male    DOB: April 02, 1950   68 y.o.   MRN: [id number] Visit Date: 03/17/2018  Today's Provider: Vernie Murders, PA   Chief Complaint  Patient presents with  . URI   Subjective:     URI   This is a new problem. The current episode started in the past 7 days. The problem has been gradually worsening. There has been no fever. Associated symptoms include congestion, coughing, headaches, a plugged ear sensation, sinus pain, a sore throat and swollen glands. Pertinent negatives include no diarrhea, ear pain, nausea, rhinorrhea, sneezing or wheezing. He has tried decongestant and NSAIDs for the symptoms. The treatment provided mild relief.   Past Medical History:  Diagnosis Date  . GERD (gastroesophageal reflux disease)   . History of chicken pox 06/12/2014   DID have Chicken Pox. DID have Measles. DID have Mumps. DID have Rubella.    . History of measles   . History of mumps   . History of rubella   . Sleep apnea    Past Surgical History:  Procedure Laterality Date  . ABDOMINAL SURGERY    . BACK SURGERY  12-04-2003   Lumbar fusion & C5-C6 fusion  . HERNIA REPAIR    . Myocardial perfusion scan  10/10/2006  . NECK SURGERY    . NERVE, TENDON AND ARTERY REPAIR Left 10/18/2012   Procedure: NERVE, TENDON AND ARTERY REPAIR;  Surgeon: Linna Hoff, MD;  Location: [facility];  Service: Orthopedics;  Laterality: Left;  . NISSEN FUNDOPLICATION  8841  . UPPER GI ENDOSCOPY  04/09/2007   Dr. Tiffany Kocher; Mucosal changes suspicious for Barretts esophagits  . WOUND EXPLORATION Left 10/18/2012   Procedure: WOUND EXPLORATION;  Surgeon: Linna Hoff, MD;  Location: [facility];  Service: Orthopedics;  Laterality: Left;   Family History  Problem Relation Age of Onset  . Hypertension Sister   . Bipolar disorder Sister   . Emphysema Mother   . Diabetes Daughter 3  . Bipolar disorder Brother   . Hypertension Brother   . Obesity Brother    Allergies  Allergen Reactions  . Hctz  [Hydrochlorothiazide]     Leg cramps    Current Outpatient Medications:  .  amLODipine (NORVASC) 10 MG tablet, TAKE 1 TABLET BY MOUTH EVERY DAY, Disp: 90 tablet, Rfl: 4 .  aspirin 325 MG tablet, Take 325 mg by mouth once. , Disp: , Rfl:  .  Cholecalciferol (VITAMIN D3 PO), Take daily by mouth., Disp: , Rfl:  .  citalopram (CELEXA) 40 MG tablet, Take 20 mg by mouth daily., Disp: , Rfl:  .  clonazePAM (KLONOPIN) 0.5 MG tablet, Take 1 tablet (0.5 mg total) by mouth every 8 (eight) hours as needed. for anxiety, Disp: 90 tablet, Rfl: 4 .  Cyanocobalamin (VITAMIN B-12 PO), Take by mouth daily., Disp: , Rfl:  .  cyclobenzaprine (FLEXERIL) 5 MG tablet, Take 1 tablet (5 mg total) by mouth 3 (three) times daily as needed for muscle spasms., Disp: 30 tablet, Rfl: 1 .  HYDROcodone-acetaminophen (NORCO) 10-325 MG tablet, Take 1 tablet by mouth every 6 (six) hours as needed., Disp: 60 tablet, Rfl: 0 .  metoprolol succinate (TOPROL-XL) 50 MG 24 hr tablet, TAKE 1 TABLET (50 MG TOTAL) BY MOUTH DAILY. IN THE EVENING, Disp: 90 tablet, Rfl: 4 .  omeprazole (PRILOSEC) 20 MG capsule, Please specify directions, refills and quantity, Disp: 30 capsule, Rfl: 12 .  POTASSIUM PO, Take daily by mouth., Disp: ,  Rfl:  .  pravastatin (PRAVACHOL) 40 MG tablet, Take 1 tablet (40 mg total) by mouth daily., Disp: 30 tablet, Rfl: 3 .  sildenafil (REVATIO) 20 MG tablet, TAKE 1-2 TABLETS (20-40 MG TOTAL) BY MOUTH DAILY., Disp: 60 tablet, Rfl: 5 .  spironolactone (ALDACTONE) 25 MG tablet, TAKE 1 TABLET BY MOUTH EVERY DAY, Disp: 30 tablet, Rfl: 11 .  telmisartan-hydrochlorothiazide (MICARDIS HCT) 80-25 MG tablet, TAKE 1 TABLET BY MOUTH EVERY DAY, Disp: 90 tablet, Rfl: 3  Review of Systems  Constitutional: Positive for chills, diaphoresis and fatigue. Negative for activity change, appetite change, fever and unexpected weight change.  HENT: Positive for congestion, sinus pressure, sinus pain, sore throat, tinnitus, trouble swallowing  and voice change. Negative for ear discharge, ear pain, hearing loss, postnasal drip, rhinorrhea and sneezing.   Eyes: Negative.   Respiratory: Positive for cough and chest tightness. Negative for apnea, choking, shortness of breath, wheezing and stridor.   Gastrointestinal: Negative.  Negative for diarrhea and nausea.  Neurological: Positive for headaches. Negative for dizziness and light-headedness.   Social History   Tobacco Use  . Smoking status: Current Some Day Smoker    Packs/day: 0.50    Years: 5.00    Pack years: 2.50    Types: Cigarettes  . Smokeless tobacco: Never Used  . Tobacco comment: quit for 30 years  Substance Use Topics  . Alcohol use: Yes    Alcohol/week: 0.0 standard drinks    Comment: drinks wine every night before bed     Objective:   BP 120/84 (BP Location: Right Arm, Patient Position: Sitting, Cuff Size: Large)   Pulse 64   Temp 97.8 F (36.6 C) (Oral)   Resp 16   Wt 174 lb (78.9 kg)   BMI 26.46 kg/m  Vitals:   03/17/18 0816  BP: 120/84  Pulse: 64  Resp: 16  Temp: 97.8 F (36.6 C)  TempSrc: Oral  Weight: 174 lb (78.9 kg)   Physical Exam Constitutional:      General: He is not in acute distress.    Appearance: He is well-developed.  HENT:     Head: Normocephalic and atraumatic.     Right Ear: Hearing and tympanic membrane normal.     Left Ear: Hearing and tympanic membrane normal.     Nose: Nose normal.     Comments: Slightly tender with no transillumination of the left maxillary sinus.    Mouth/Throat:     Comments: Slight redness to posterior pharynx with cobblestone appearance. No exudates. Eyes:     General: Lids are normal. No scleral icterus.       Right eye: No discharge.        Left eye: No discharge.     Conjunctiva/sclera: Conjunctivae normal.  Cardiovascular:     Rate and Rhythm: Normal rate and regular rhythm.     Heart sounds: Normal heart sounds.  Pulmonary:     Effort: Pulmonary effort is normal. No respiratory  distress.  Abdominal:     General: Bowel sounds are normal.  Musculoskeletal: Normal range of motion.  Lymphadenopathy:     Cervical: No cervical adenopathy.  Skin:    Findings: No lesion or rash.  Neurological:     Mental Status: He is alert and oriented to person, place, and time.  Psychiatric:        Speech: Speech normal.        Behavior: Behavior normal.        Thought Content: Thought content normal.  Assessment & Plan     1. Left maxillary sinusitis Onset 3 days ago with PND, sore throat, some chills, cough and congestion. Tender left maxillary with no transillumination. Treat with Amoxil and continue Mucinex-DM with Ibuprofen prn. Increase fluid intake and recheck if no better in 5-7 days. - amoxicillin (AMOXIL) 875 MG tablet; Take 1 tablet (875 mg total) by mouth 2 (two) times daily.  Dispense: 20 tablet; Refill: [place], PA  [facility]

## 2018-03-29 ENCOUNTER — Other Ambulatory Visit: Payer: Self-pay | Admitting: Family Medicine

## 2018-03-29 DIAGNOSIS — G8929 Other chronic pain: Secondary | ICD-10-CM

## 2018-03-29 DIAGNOSIS — M5441 Lumbago with sciatica, right side: Principal | ICD-10-CM

## 2018-03-29 MED ORDER — HYDROCODONE-ACETAMINOPHEN 10-325 MG PO TABS: 1.0000 | ORAL_TABLET | Freq: Four times a day (QID) | ORAL | 0 refills | Status: DC | PRN

## 2018-03-29 NOTE — Telephone Encounter (Signed)
Pt needing refill on: HYDROcodone-acetaminophen (NORCO) 10-325 MG tablet  Please fill at:  [id number] - [place], [place] [phone] (Phone) [phone] (Fax)   Thanks, [employer]

## 2018-04-11 ENCOUNTER — Other Ambulatory Visit: Payer: Self-pay

## 2018-04-11 DIAGNOSIS — M5441 Lumbago with sciatica, right side: Principal | ICD-10-CM

## 2018-04-11 DIAGNOSIS — G8929 Other chronic pain: Secondary | ICD-10-CM

## 2018-04-11 NOTE — Telephone Encounter (Signed)
Patient is requesting a refill on HYDROcodone-acetaminophen (NORCO) 10-325 MG tablet [facility].

## 2018-04-12 MED ORDER — HYDROCODONE-ACETAMINOPHEN 10-325 MG PO TABS: 1.0000 | ORAL_TABLET | Freq: Four times a day (QID) | ORAL | 0 refills | Status: DC | PRN

## 2018-04-25 ENCOUNTER — Other Ambulatory Visit: Payer: Self-pay

## 2018-04-25 DIAGNOSIS — M5441 Lumbago with sciatica, right side: Principal | ICD-10-CM

## 2018-04-25 DIAGNOSIS — G8929 Other chronic pain: Secondary | ICD-10-CM

## 2018-04-25 NOTE — Telephone Encounter (Signed)
Patient called and requested refill on his Norco 10-325 MG tablets. Patient is aware he just got them filled on 04/12/2018. L.O.V. was 03/17/2018. Please advice.

## 2018-04-26 ENCOUNTER — Telehealth: Payer: Self-pay

## 2018-04-26 MED ORDER — HYDROCODONE-ACETAMINOPHEN 10-325 MG PO TABS: 1.0000 | ORAL_TABLET | Freq: Four times a day (QID) | ORAL | 0 refills | Status: DC | PRN

## 2018-04-26 NOTE — Telephone Encounter (Signed)
Patient called to check the status of his prescription.  He states he called 2 days ago.

## 2018-04-29 ENCOUNTER — Other Ambulatory Visit: Payer: Self-pay | Admitting: Family Medicine

## 2018-05-10 ENCOUNTER — Other Ambulatory Visit: Payer: Self-pay | Admitting: Family Medicine

## 2018-05-10 DIAGNOSIS — M5441 Lumbago with sciatica, right side: Principal | ICD-10-CM

## 2018-05-10 DIAGNOSIS — G8929 Other chronic pain: Secondary | ICD-10-CM

## 2018-05-10 NOTE — Telephone Encounter (Signed)
Pt needs refill on his   Hydrocodone 10-325  [facility] [place]  Thanks teri

## 2018-05-11 NOTE — Telephone Encounter (Signed)
Last dispensed 15 days supply on 04/27/2018

## 2018-05-11 NOTE — Telephone Encounter (Signed)
Please refill he only has 2 pills left.  I told him Dr. Caryn Section would be back tomorrow.  He said ok.  He would like them before the weekend  [facility] [place]  Thanks teri

## 2018-05-12 ENCOUNTER — Telehealth: Payer: Self-pay

## 2018-05-12 MED ORDER — HYDROCODONE-ACETAMINOPHEN 10-325 MG PO TABS: 1.0000 | ORAL_TABLET | Freq: Four times a day (QID) | ORAL | 0 refills | Status: DC | PRN

## 2018-05-12 NOTE — Telephone Encounter (Signed)
Patient is requesting a refill for Hydrocodone

## 2018-05-16 ENCOUNTER — Encounter: Payer: Self-pay | Admitting: Family Medicine

## 2018-05-16 ENCOUNTER — Other Ambulatory Visit: Payer: Self-pay

## 2018-05-16 ENCOUNTER — Ambulatory Visit (INDEPENDENT_AMBULATORY_CARE_PROVIDER_SITE_OTHER): Payer: Medicare Other | Admitting: Family Medicine

## 2018-05-16 VITALS — BP 127/79 | HR 62 | Temp 97.9°F | Wt 178.0 lb

## 2018-05-16 DIAGNOSIS — N6320 Unspecified lump in the left breast, unspecified quadrant: Secondary | ICD-10-CM

## 2018-05-16 NOTE — Progress Notes (Signed)
Patient: Gary Carter Male    DOB: 1950-10-15   68 y.o.   MRN: [id number] Visit Date: 05/16/2018  Today's Provider: Lelon Huh, MD   Chief Complaint  Patient presents with  . Breast Mass    Started about 8 months ago after Trauma   Subjective:     HPI   Breast Mass: Patient presents for evaluation of a breast mass. Change was noted 8 months ago after trauma to left nipple. Patient denies nipple discharge.   Allergies  Allergen Reactions  . Hctz [Hydrochlorothiazide]     Leg cramps     Current Outpatient Medications:  .  amLODipine (NORVASC) 10 MG tablet, TAKE 1 TABLET BY MOUTH EVERY DAY, Disp: 90 tablet, Rfl: 4 .  aspirin 325 MG tablet, Take 325 mg by mouth once. , Disp: , Rfl:  .  Cholecalciferol (VITAMIN D3 PO), Take daily by mouth., Disp: , Rfl:  .  citalopram (CELEXA) 40 MG tablet, Take 20 mg by mouth daily., Disp: , Rfl:  .  clonazePAM (KLONOPIN) 0.5 MG tablet, Take 1 tablet (0.5 mg total) by mouth every 8 (eight) hours as needed. for anxiety, Disp: 90 tablet, Rfl: 4 .  Cyanocobalamin (VITAMIN B-12 PO), Take by mouth daily., Disp: , Rfl:  .  cyclobenzaprine (FLEXERIL) 5 MG tablet, Take 1 tablet (5 mg total) by mouth 3 (three) times daily as needed for muscle spasms., Disp: 30 tablet, Rfl: 1 .  HYDROcodone-acetaminophen (NORCO) 10-325 MG tablet, Take 1 tablet by mouth every 6 (six) hours as needed., Disp: 60 tablet, Rfl: 0 .  metoprolol succinate (TOPROL-XL) 50 MG 24 hr tablet, TAKE 1 TABLET (50 MG TOTAL) BY MOUTH DAILY. IN THE EVENING, Disp: 90 tablet, Rfl: 4 .  omeprazole (PRILOSEC) 20 MG capsule, Please specify directions, refills and quantity, Disp: 30 capsule, Rfl: 12 .  POTASSIUM PO, Take daily by mouth., Disp: , Rfl:  .  pravastatin (PRAVACHOL) 40 MG tablet, Take 1 tablet (40 mg total) by mouth daily., Disp: 30 tablet, Rfl: 3 .  sildenafil (REVATIO) 20 MG tablet, TAKE 1-2 TABLETS (20-40 MG TOTAL) BY MOUTH DAILY., Disp: 60 tablet, Rfl: 5 .   spironolactone (ALDACTONE) 25 MG tablet, TAKE 1 TABLET BY MOUTH EVERY DAY, Disp: 90 tablet, Rfl: 3 .  telmisartan-hydrochlorothiazide (MICARDIS HCT) 80-25 MG tablet, TAKE 1 TABLET BY MOUTH EVERY DAY, Disp: 90 tablet, Rfl: 3 .  amoxicillin (AMOXIL) 875 MG tablet, Take 1 tablet (875 mg total) by mouth 2 (two) times daily. (Patient not taking: Reported on 05/16/2018), Disp: 20 tablet, Rfl: 0  Review of Systems  Constitutional: Negative.     Social History   Tobacco Use  . Smoking status: Current Some Day Smoker    Packs/day: 0.50    Years: 5.00    Pack years: 2.50    Types: Cigarettes  . Smokeless tobacco: Never Used  . Tobacco comment: quit for 30 years  Substance Use Topics  . Alcohol use: Yes    Alcohol/week: 0.0 standard drinks    Comment: drinks wine every night before bed      Objective:   BP 127/79 (BP Location: Left Arm, Patient Position: Sitting, Cuff Size: Normal)   Pulse 62   Temp 97.9 F (36.6 C) (Oral)   Wt 178 lb (80.7 kg)   BMI 27.06 kg/m  Vitals:   05/16/18 1005  BP: 127/79  Pulse: 62  Temp: 97.9 F (36.6 C)  TempSrc: Oral  Weight: 178 lb (80.7 kg)  Physical Exam  General appearance: alert, well developed, well nourished, cooperative and in no distress Head: Normocephalic, without obvious abnormality, atraumatic Respiratory: Respirations even and unlabored, normal respiratory rate Breast: discreet slightly tender nodule left breast under areola just above nipple. No skin changes. No erythema.      Assessment & Plan    1. Mass of left breast US left breast     The entirety of the information documented in the History of Present Illness, Review of Systems and Physical Exam were personally obtained by me. Portions of this information were initially documented by Ashley Royalty, CMA and reviewed by me for thoroughness and accuracy.   Lelon Huh, MD  [facility]

## 2018-05-18 ENCOUNTER — Telehealth: Payer: Self-pay

## 2018-05-18 DIAGNOSIS — M5441 Lumbago with sciatica, right side: Secondary | ICD-10-CM

## 2018-05-18 DIAGNOSIS — G8929 Other chronic pain: Secondary | ICD-10-CM

## 2018-05-18 NOTE — Telephone Encounter (Signed)
Patient calling that he wants another medicine send in. Reports the Hydrocodone pain medicine is not the same one he has been given before is a different manufacture and that this medicine is causing him to feel suicidal. Advised patient to stopt taking this medicine for now until one of Dr.fisher's cma can get back with him

## 2018-05-19 ENCOUNTER — Telehealth: Payer: Self-pay

## 2018-05-19 DIAGNOSIS — N6320 Unspecified lump in the left breast, unspecified quadrant: Secondary | ICD-10-CM

## 2018-05-19 MED ORDER — OXYCODONE-ACETAMINOPHEN 7.5-325 MG PO TABS: 1.0000 | ORAL_TABLET | Freq: Three times a day (TID) | ORAL | 0 refills | Status: DC | PRN

## 2018-05-19 NOTE — Telephone Encounter (Signed)
Advised patient as below.  

## 2018-05-19 NOTE — Telephone Encounter (Signed)
Have sent prescription for oxycodone/apap to take in its place.

## 2018-05-19 NOTE — Telephone Encounter (Signed)
Patient is calling to check on [place] appointment. CB#[phone]

## 2018-05-19 NOTE — Telephone Encounter (Signed)
Can you please add diagnostic mammogram TOMO, Thanks

## 2018-05-22 ENCOUNTER — Telehealth: Payer: Self-pay

## 2018-05-22 NOTE — Telephone Encounter (Signed)
Patient called back wanting to know if this has been scheduled. It looks like the order needs to be changed as below. Patient wants to know if another doctor can change the order since Dr. Caryn Section is out of the office until Friday. Patient is anxious.

## 2018-05-22 NOTE — Telephone Encounter (Signed)
Order placed

## 2018-05-22 NOTE — Telephone Encounter (Signed)
Patient states that he has called the office and referral department 4x without a call back. Patient is wanting to inquire about his ultrasound. Please advise. KW

## 2018-05-23 ENCOUNTER — Telehealth: Payer: Self-pay | Admitting: Family Medicine

## 2018-05-23 ENCOUNTER — Other Ambulatory Visit: Payer: Self-pay | Admitting: Family Medicine

## 2018-05-23 ENCOUNTER — Telehealth: Payer: Self-pay

## 2018-05-23 DIAGNOSIS — N529 Male erectile dysfunction, unspecified: Secondary | ICD-10-CM

## 2018-05-23 DIAGNOSIS — N6320 Unspecified lump in the left breast, unspecified quadrant: Secondary | ICD-10-CM

## 2018-05-23 NOTE — Telephone Encounter (Signed)
Can you add order for diagnostic bilateral mammogram TOMO, Thanks

## 2018-05-23 NOTE — Telephone Encounter (Signed)
Patient is calling office again wanting to know status of ultrasound. Patient states that he has called multiple times and it is urgent here hears back from someone today. KW

## 2018-05-23 NOTE — Telephone Encounter (Signed)
Sarah please update patient.

## 2018-05-24 NOTE — Telephone Encounter (Signed)
Gary Carter is also requesting an order for right breast limited ultrasound to be added [id number]

## 2018-05-29 ENCOUNTER — Ambulatory Visit
Admission: RE | Admit: 2018-05-29 | Discharge: 2018-05-29 | Disposition: A | Discharge status: Home / Self Care | Payer: Medicare Other | Source: Ambulatory Visit | Attending: Family Medicine | Admitting: Family Medicine

## 2018-05-29 ENCOUNTER — Other Ambulatory Visit: Payer: Self-pay

## 2018-05-29 ENCOUNTER — Other Ambulatory Visit: Payer: Self-pay | Admitting: Family Medicine

## 2018-05-29 DIAGNOSIS — N6320 Unspecified lump in the left breast, unspecified quadrant: Secondary | ICD-10-CM

## 2018-05-29 DIAGNOSIS — N62 Hypertrophy of breast: Secondary | ICD-10-CM | POA: Insufficient documentation

## 2018-05-29 DIAGNOSIS — R928 Other abnormal and inconclusive findings on diagnostic imaging of breast: Secondary | ICD-10-CM | POA: Diagnosis not present

## 2018-05-29 DIAGNOSIS — G8929 Other chronic pain: Secondary | ICD-10-CM

## 2018-05-29 NOTE — Telephone Encounter (Signed)
Please review. Thanks!  

## 2018-05-29 NOTE — Telephone Encounter (Signed)
Pt needs refill   Hydrocodone 10-325  [facility]  Thanks teri

## 2018-05-30 ENCOUNTER — Telehealth: Payer: Self-pay

## 2018-05-30 DIAGNOSIS — G8929 Other chronic pain: Secondary | ICD-10-CM

## 2018-05-30 MED ORDER — HYDROCODONE-ACETAMINOPHEN 10-325 MG PO TABS: 1.0000 | ORAL_TABLET | Freq: Four times a day (QID) | ORAL | 0 refills | Status: DC | PRN

## 2018-05-30 NOTE — Telephone Encounter (Signed)
done

## 2018-05-30 NOTE — Telephone Encounter (Signed)
Please see result note 

## 2018-05-30 NOTE — Patient Instructions (Signed)
.   Please review the attached list of medications and notify my office if there are any errors.   . Please bring all of your medications to every appointment so we can make sure that our medication list is the same as yours.   

## 2018-05-30 NOTE — Telephone Encounter (Signed)
Patient called wanting to know the results of his mammogram. He reports that the tech told him that what he has could be related to a medication he could be taking? Please advise.   Patient also requesting refills on pain medication. Thanks!

## 2018-05-31 NOTE — Telephone Encounter (Signed)
Patient advised of results.

## 2018-06-13 ENCOUNTER — Other Ambulatory Visit: Payer: Self-pay

## 2018-06-13 DIAGNOSIS — G8929 Other chronic pain: Secondary | ICD-10-CM

## 2018-06-13 MED ORDER — HYDROCODONE-ACETAMINOPHEN 10-325 MG PO TABS: 1.0000 | ORAL_TABLET | Freq: Four times a day (QID) | ORAL | 0 refills | Status: DC | PRN

## 2018-06-19 ENCOUNTER — Encounter: Payer: Self-pay | Admitting: Family Medicine

## 2018-06-19 ENCOUNTER — Other Ambulatory Visit: Payer: Self-pay

## 2018-06-19 ENCOUNTER — Ambulatory Visit (INDEPENDENT_AMBULATORY_CARE_PROVIDER_SITE_OTHER): Payer: Medicare Other | Admitting: Family Medicine

## 2018-06-19 VITALS — BP 127/87 | HR 57 | Temp 97.9°F | Wt 178.0 lb

## 2018-06-19 DIAGNOSIS — I10 Essential (primary) hypertension: Secondary | ICD-10-CM | POA: Diagnosis not present

## 2018-06-19 DIAGNOSIS — E785 Hyperlipidemia, unspecified: Secondary | ICD-10-CM | POA: Diagnosis not present

## 2018-06-19 DIAGNOSIS — S46919A Strain of unspecified muscle, fascia and tendon at shoulder and upper arm level, unspecified arm, initial encounter: Secondary | ICD-10-CM | POA: Diagnosis not present

## 2018-06-19 MED ORDER — MELOXICAM 7.5 MG PO TABS: 7.5000 mg | ORAL_TABLET | Freq: Every day | ORAL | 2 refills | Status: DC | PRN

## 2018-06-19 NOTE — Patient Instructions (Signed)
.   Please review the attached list of medications and notify my office if there are any errors.   . Please bring all of your medications to every appointment so we can make sure that our medication list is the same as yours.   

## 2018-06-19 NOTE — Progress Notes (Signed)
Patient: Gary Carter Male    DOB: 10/04/1950   68 y.o.   MRN: [id number] Visit Date: 06/19/2018  Today's Provider: Lelon Huh, MD   No chief complaint on file.  Subjective:     HPI     Hypertension, follow-up:  BP Readings from Last 3 Encounters:  06/19/18 127/87  05/16/18 127/79  03/17/18 120/84    He was last seen for hypertension 4 months ago.  BP at that visit was 140/88. Management since that visit includes No changes He reports excellent compliance with treatment. He is not having side effects.  He is exercising. He is adherent to low salt diet.   Outside blood pressures are not being checked. He is experiencing none.  Patient denies chest pain, fatigue, irregular heart beat, palpitations and syncope.   Cardiovascular risk factors include advanced age (older than 37 for men, 38 for women), hypertension and male gender.  Use of agents associated with hypertension: none.   ------------------------------------------------------------------------    Lipid/Cholesterol, Follow-up:   Last seen for this 4 months ago.  Management since that visit includes starting pravastatin 40mg  once a day.  Last Lipid Panel:    Component Value Date/Time   CHOL 188 02/15/2018 0807   TRIG 85 02/15/2018 0807   HDL 61 02/15/2018 0807   CHOLHDL 3.1 02/15/2018 0807   LDLCALC 110 (H) 02/15/2018 0807    He reports excellent compliance with treatment. He is not having side effects.   Wt Readings from Last 3 Encounters:  06/19/18 178 lb (80.7 kg)  05/16/18 178 lb (80.7 kg)  03/17/18 174 lb (78.9 kg)    ------------------------------------------------------------------------  He also states he has had some soreness in shoulders off and on. He tried meloxicam in the past which worked well.   Allergies  Allergen Reactions  . Hctz [Hydrochlorothiazide]     Leg cramps     Current Outpatient Medications:  .  amLODipine (NORVASC) 10 MG tablet, TAKE 1 TABLET BY  MOUTH EVERY DAY, Disp: 90 tablet, Rfl: 4 .  aspirin 325 MG tablet, Take 325 mg by mouth once. , Disp: , Rfl:  .  Cholecalciferol (VITAMIN D3 PO), Take daily by mouth., Disp: , Rfl:  .  citalopram (CELEXA) 40 MG tablet, Take 20 mg by mouth daily., Disp: , Rfl:  .  clonazePAM (KLONOPIN) 0.5 MG tablet, Take 1 tablet (0.5 mg total) by mouth every 8 (eight) hours as needed. for anxiety, Disp: 90 tablet, Rfl: 4 .  Cyanocobalamin (VITAMIN B-12 PO), Take by mouth daily., Disp: , Rfl:  .  cyclobenzaprine (FLEXERIL) 5 MG tablet, Take 1 tablet (5 mg total) by mouth 3 (three) times daily as needed for muscle spasms., Disp: 30 tablet, Rfl: 1 .  HYDROcodone-acetaminophen (NORCO) 10-325 MG tablet, Take 1 tablet by mouth every 6 (six) hours as needed., Disp: 60 tablet, Rfl: 0 .  metoprolol succinate (TOPROL-XL) 50 MG 24 hr tablet, TAKE 1 TABLET (50 MG TOTAL) BY MOUTH DAILY. IN THE EVENING, Disp: 90 tablet, Rfl: 4 .  omeprazole (PRILOSEC) 20 MG capsule, Please specify directions, refills and quantity, Disp: 30 capsule, Rfl: 12 .  oxyCODONE-acetaminophen (PERCOCET) 7.5-325 MG tablet, Take 1 tablet by mouth every 8 (eight) hours as needed for severe pain. STOP taking hydrocodone/apap, Disp: 30 tablet, Rfl: 0 .  POTASSIUM PO, Take daily by mouth., Disp: , Rfl:  .  pravastatin (PRAVACHOL) 40 MG tablet, Take 1 tablet (40 mg total) by mouth daily., Disp: 30 tablet, Rfl:  3 .  sildenafil (REVATIO) 20 MG tablet, TAKE 1-2 TABLETS (20-40 MG TOTAL) BY MOUTH DAILY., Disp: 180 tablet, Rfl: 4 .  spironolactone (ALDACTONE) 25 MG tablet, TAKE 1 TABLET BY MOUTH EVERY DAY, Disp: 90 tablet, Rfl: 3 .  telmisartan-hydrochlorothiazide (MICARDIS HCT) 80-25 MG tablet, TAKE 1 TABLET BY MOUTH EVERY DAY, Disp: 90 tablet, Rfl: 3  Review of Systems  Constitutional: Negative.   Respiratory: Negative.   Cardiovascular: Negative.   Gastrointestinal: Negative.   Musculoskeletal: Negative.   Neurological: Negative for dizziness,  light-headedness and headaches.    Social History   Tobacco Use  . Smoking status: Current Some Day Smoker    Packs/day: 0.50    Years: 5.00    Pack years: 2.50    Types: Cigarettes  . Smokeless tobacco: Never Used  . Tobacco comment: quit for 30 years  Substance Use Topics  . Alcohol use: Yes    Alcohol/week: 0.0 standard drinks    Comment: drinks wine every night before bed      Objective:   BP 127/87 (BP Location: Right Arm, Patient Position: Sitting, Cuff Size: Normal)   Pulse (!) 57   Temp 97.9 F (36.6 C) (Oral)   Wt 178 lb (80.7 kg)   BMI 27.06 kg/m  Vitals:   06/19/18 0813  BP: 127/87  Pulse: (!) 57  Temp: 97.9 F (36.6 C)  TempSrc: Oral  Weight: 178 lb (80.7 kg)     Physical Exam  General appearance: alert, well developed, well nourished, cooperative and in no distress Head: Normocephalic, without obvious abnormality, atraumatic Respiratory: Respirations even and unlabored, normal respiratory rate Extremities: No gross deformities Skin: Skin color, texture, turgor normal. No rashes seen  Psych: Appropriate mood and affect. Neurologic: Mental status: Alert, oriented to person, place, and time, thought content appropriate.      Assessment & Plan    1. Essential (primary) hypertension Well controlled.  Continue current medications.    2. Hyperlipidemia, unspecified hyperlipidemia type He is tolerating pravastatin well with no adverse effects.   - Comprehensive metabolic panel - Lipid panel  3. Strain of shoulder, unspecified laterality, initial encounter Prescription. - meloxicam (MOBIC) 7.5 MG tablet; Take 1 tablet (7.5 mg total) by mouth daily as needed for pain.  Dispense: 30 tablet; Refill: 2     The entirety of the information documented in the History of Present Illness, Review of Systems and Physical Exam were personally obtained by me. Portions of this information were initially documented by Ashley Royalty, CMA and reviewed by me for  thoroughness and accuracy.   Lelon Huh, MD  [facility]

## 2018-06-20 ENCOUNTER — Telehealth: Payer: Self-pay

## 2018-06-20 ENCOUNTER — Other Ambulatory Visit: Payer: Self-pay | Admitting: Family Medicine

## 2018-06-20 LAB — LIPID PANEL
Chol/HDL Ratio: 2.7 ratio (ref 0.0–5.0)
Cholesterol, Total: 156 mg/dL (ref 100–199)
HDL: 58 mg/dL (ref >39)
LDL Calculated: 75 mg/dL (ref 0–99)
Triglycerides: 114 mg/dL (ref 0–149)
VLDL Cholesterol Cal: 23 mg/dL (ref 5–40)

## 2018-06-20 LAB — COMPREHENSIVE METABOLIC PANEL
ALT: 32 IU/L (ref 0–44)
AST: 23 IU/L (ref 0–40)
Albumin/Globulin Ratio: 2 (ref 1.2–2.2)
Albumin: 4.7 g/dL (ref 3.8–4.8)
Alkaline Phosphatase: 53 IU/L (ref 39–117)
BUN/Creatinine Ratio: 25 — ABNORMAL HIGH (ref 10–24)
BUN: 19 mg/dL (ref 8–27)
Bilirubin Total: 0.9 mg/dL (ref 0.0–1.2)
CO2: 24 mmol/L (ref 20–29)
Calcium: 9.8 mg/dL (ref 8.6–10.2)
Chloride: 100 mmol/L (ref 96–106)
Creatinine, Ser: 0.76 mg/dL (ref 0.76–1.27)
GFR calc Af Amer: 111 mL/min/{1.73_m2} (ref >59)
GFR calc non Af Amer: 96 mL/min/{1.73_m2} (ref >59)
Globulin, Total: 2.4 g/dL (ref 1.5–4.5)
Glucose: 112 mg/dL — ABNORMAL HIGH (ref 65–99)
Potassium: 4.3 mmol/L (ref 3.5–5.2)
Sodium: 137 mmol/L (ref 134–144)
Total Protein: 7.1 g/dL (ref 6.0–8.5)

## 2018-06-20 NOTE — Telephone Encounter (Signed)
Pt advised.   Thanks,   -Clerance Umland  

## 2018-06-20 NOTE — Telephone Encounter (Signed)
-----   Message from Birdie Sons, MD sent at 06/20/2018  7:17 AM EDT ----- Labs are good, cholesterol is well controlled at 156. Continue current medications.  Check labs yearly.

## 2018-06-28 ENCOUNTER — Other Ambulatory Visit: Payer: Self-pay

## 2018-06-28 DIAGNOSIS — G8929 Other chronic pain: Secondary | ICD-10-CM

## 2018-06-29 MED ORDER — HYDROCODONE-ACETAMINOPHEN 10-325 MG PO TABS: 1.0000 | ORAL_TABLET | Freq: Four times a day (QID) | ORAL | 0 refills | Status: DC | PRN

## 2018-07-03 DIAGNOSIS — K219 Gastro-esophageal reflux disease without esophagitis: Secondary | ICD-10-CM | POA: Diagnosis not present

## 2018-07-03 DIAGNOSIS — R1310 Dysphagia, unspecified: Secondary | ICD-10-CM | POA: Diagnosis not present

## 2018-07-03 DIAGNOSIS — K449 Diaphragmatic hernia without obstruction or gangrene: Secondary | ICD-10-CM | POA: Diagnosis not present

## 2018-07-03 DIAGNOSIS — K2270 Barrett's esophagus without dysplasia: Secondary | ICD-10-CM | POA: Diagnosis not present

## 2018-07-03 DIAGNOSIS — K760 Fatty (change of) liver, not elsewhere classified: Secondary | ICD-10-CM | POA: Insufficient documentation

## 2018-07-03 DIAGNOSIS — R1319 Other dysphagia: Secondary | ICD-10-CM | POA: Insufficient documentation

## 2018-07-06 ENCOUNTER — Other Ambulatory Visit
Admission: RE | Admit: 2018-07-06 | Discharge: 2018-07-06 | Disposition: A | Discharge status: Home / Self Care | Payer: Medicare Other | Source: Ambulatory Visit | Attending: Unknown Physician Specialty | Admitting: Unknown Physician Specialty

## 2018-07-06 ENCOUNTER — Other Ambulatory Visit: Payer: Self-pay

## 2018-07-06 DIAGNOSIS — Z1159 Encounter for screening for other viral diseases: Secondary | ICD-10-CM | POA: Insufficient documentation

## 2018-07-07 LAB — NOVEL CORONAVIRUS, NAA (HOSP ORDER, SEND-OUT TO REF LAB; TAT 18-24 HRS): SARS-CoV-2, NAA: NOT DETECTED

## 2018-07-10 ENCOUNTER — Ambulatory Visit
Admission: RE | Admit: 2018-07-10 | Discharge: 2018-07-10 | Disposition: A | Discharge status: Home / Self Care | Payer: Medicare Other | Attending: Unknown Physician Specialty | Admitting: Unknown Physician Specialty

## 2018-07-10 ENCOUNTER — Ambulatory Visit: Payer: Medicare Other | Admitting: Certified Registered Nurse Anesthetist

## 2018-07-10 ENCOUNTER — Encounter: Payer: Self-pay | Admitting: Anesthesiology

## 2018-07-10 ENCOUNTER — Other Ambulatory Visit: Payer: Self-pay

## 2018-07-10 ENCOUNTER — Encounter
Admission: RE | Disposition: A | Discharge status: Home / Self Care | Payer: Self-pay | Source: Home / Self Care | Attending: Unknown Physician Specialty

## 2018-07-10 DIAGNOSIS — K449 Diaphragmatic hernia without obstruction or gangrene: Secondary | ICD-10-CM | POA: Insufficient documentation

## 2018-07-10 DIAGNOSIS — G4730 Sleep apnea, unspecified: Secondary | ICD-10-CM | POA: Insufficient documentation

## 2018-07-10 DIAGNOSIS — K2950 Unspecified chronic gastritis without bleeding: Secondary | ICD-10-CM | POA: Insufficient documentation

## 2018-07-10 DIAGNOSIS — K2900 Acute gastritis without bleeding: Secondary | ICD-10-CM | POA: Diagnosis not present

## 2018-07-10 DIAGNOSIS — Z79899 Other long term (current) drug therapy: Secondary | ICD-10-CM | POA: Insufficient documentation

## 2018-07-10 DIAGNOSIS — K219 Gastro-esophageal reflux disease without esophagitis: Secondary | ICD-10-CM | POA: Diagnosis not present

## 2018-07-10 DIAGNOSIS — Z8249 Family history of ischemic heart disease and other diseases of the circulatory system: Secondary | ICD-10-CM | POA: Diagnosis not present

## 2018-07-10 DIAGNOSIS — K3189 Other diseases of stomach and duodenum: Secondary | ICD-10-CM | POA: Diagnosis not present

## 2018-07-10 DIAGNOSIS — Z791 Long term (current) use of non-steroidal anti-inflammatories (NSAID): Secondary | ICD-10-CM | POA: Insufficient documentation

## 2018-07-10 DIAGNOSIS — Z888 Allergy status to other drugs, medicaments and biological substances status: Secondary | ICD-10-CM | POA: Diagnosis not present

## 2018-07-10 DIAGNOSIS — Z818 Family history of other mental and behavioral disorders: Secondary | ICD-10-CM | POA: Insufficient documentation

## 2018-07-10 DIAGNOSIS — I10 Essential (primary) hypertension: Secondary | ICD-10-CM | POA: Insufficient documentation

## 2018-07-10 DIAGNOSIS — K2270 Barrett's esophagus without dysplasia: Secondary | ICD-10-CM | POA: Diagnosis not present

## 2018-07-10 DIAGNOSIS — F17210 Nicotine dependence, cigarettes, uncomplicated: Secondary | ICD-10-CM | POA: Diagnosis not present

## 2018-07-10 DIAGNOSIS — R1310 Dysphagia, unspecified: Secondary | ICD-10-CM | POA: Diagnosis not present

## 2018-07-10 DIAGNOSIS — R0602 Shortness of breath: Secondary | ICD-10-CM | POA: Diagnosis not present

## 2018-07-10 DIAGNOSIS — Z7982 Long term (current) use of aspirin: Secondary | ICD-10-CM | POA: Diagnosis not present

## 2018-07-10 DIAGNOSIS — G4733 Obstructive sleep apnea (adult) (pediatric): Secondary | ICD-10-CM | POA: Diagnosis not present

## 2018-07-10 DIAGNOSIS — Z833 Family history of diabetes mellitus: Secondary | ICD-10-CM | POA: Insufficient documentation

## 2018-07-10 DIAGNOSIS — F419 Anxiety disorder, unspecified: Secondary | ICD-10-CM | POA: Diagnosis not present

## 2018-07-10 DIAGNOSIS — K210 Gastro-esophageal reflux disease with esophagitis: Secondary | ICD-10-CM | POA: Diagnosis not present

## 2018-07-10 DIAGNOSIS — Z825 Family history of asthma and other chronic lower respiratory diseases: Secondary | ICD-10-CM | POA: Diagnosis not present

## 2018-07-10 DIAGNOSIS — F418 Other specified anxiety disorders: Secondary | ICD-10-CM | POA: Diagnosis not present

## 2018-07-10 HISTORY — DX: Shortness of breath: R06.02

## 2018-07-10 HISTORY — DX: Barrett's esophagus without dysplasia: K22.70

## 2018-07-10 HISTORY — DX: Personal history of other diseases of the digestive system: Z87.19

## 2018-07-10 HISTORY — DX: Personal history of other specified conditions: Z87.898

## 2018-07-10 HISTORY — PX: ESOPHAGOGASTRODUODENOSCOPY (EGD) WITH PROPOFOL: SHX5813

## 2018-07-10 SURGERY — ESOPHAGOGASTRODUODENOSCOPY (EGD) WITH PROPOFOL
Anesthesia: General

## 2018-07-10 MED ORDER — PROPOFOL 500 MG/50ML IV EMUL
INTRAVENOUS | Status: AC
  Filled 2018-07-10: qty 50

## 2018-07-10 MED ORDER — PROPOFOL 10 MG/ML IV BOLUS
INTRAVENOUS | Status: AC
  Filled 2018-07-10: qty 20

## 2018-07-10 MED ORDER — SODIUM CHLORIDE 0.9 % IV SOLN: INTRAVENOUS | Status: DC

## 2018-07-10 MED ORDER — PROPOFOL 500 MG/50ML IV EMUL
INTRAVENOUS | Status: DC | PRN
  Administered 2018-07-10: 125 ug/kg/min via INTRAVENOUS

## 2018-07-10 MED ORDER — SODIUM CHLORIDE 0.9 % IV SOLN
INTRAVENOUS | Status: DC
  Administered 2018-07-10 (×2): via INTRAVENOUS

## 2018-07-10 MED ORDER — PROPOFOL 10 MG/ML IV BOLUS
INTRAVENOUS | Status: DC | PRN
  Administered 2018-07-10: 50 mg via INTRAVENOUS
  Administered 2018-07-10: 30 mg via INTRAVENOUS

## 2018-07-10 MED ORDER — LIDOCAINE HCL (CARDIAC) PF 100 MG/5ML IV SOSY
PREFILLED_SYRINGE | INTRAVENOUS | Status: DC | PRN
  Administered 2018-07-10: 50 mg via INTRAVENOUS

## 2018-07-10 NOTE — Anesthesia Postprocedure Evaluation (Signed)
Anesthesia Post Note  Patient: Gary Carter  Procedure(s) Performed: ESOPHAGOGASTRODUODENOSCOPY (EGD) WITH PROPOFOL (N/A )  Patient location during evaluation: Endoscopy Anesthesia Type: General Level of consciousness: awake and alert Pain management: pain level controlled Vital Signs Assessment: post-procedure vital signs reviewed and stable Respiratory status: spontaneous breathing, nonlabored ventilation, respiratory function stable and patient connected to nasal cannula oxygen Cardiovascular status: blood pressure returned to baseline and stable Postop Assessment: no apparent nausea or vomiting Anesthetic complications: no     Last Vitals:  Vitals:   07/10/18 1432 07/10/18 1442  BP: 106/84 (!) 136/93  Pulse: (!) 57 (!) 56  Resp: 17 15  Temp:    SpO2: 96% 97%    Last Pain:  Vitals:   07/10/18 1442  TempSrc:   PainSc: 0-No pain                 Micky Sheller S

## 2018-07-10 NOTE — Anesthesia Post-op Follow-up Note (Signed)
Anesthesia QCDR form completed.        

## 2018-07-10 NOTE — Anesthesia Preprocedure Evaluation (Signed)
Anesthesia Evaluation  Patient identified by MRN, date of birth, ID band Patient awake    Reviewed: Allergy & Precautions, NPO status , Patient's Chart, lab work & pertinent test results, reviewed documented beta blocker date and time   Airway Mallampati: II  TM Distance: >3 FB     Dental  (+) Chipped   Pulmonary sleep apnea , Current Smoker,           Cardiovascular hypertension, Pt. on medications and Pt. on home beta blockers      Neuro/Psych PSYCHIATRIC DISORDERS Anxiety Depression    GI/Hepatic GERD  ,  Endo/Other    Renal/GU      Musculoskeletal   Abdominal   Peds  Hematology   Anesthesia Other Findings Nissen-fundo.  Reproductive/Obstetrics                             Anesthesia Physical Anesthesia Plan  ASA: III  Anesthesia Plan: General   Post-op Pain Management:    Induction: Intravenous  PONV Risk Score and Plan:   Airway Management Planned:   Additional Equipment:   Intra-op Plan:   Post-operative Plan:   Informed Consent: I have reviewed the patients History and Physical, chart, labs and discussed the procedure including the risks, benefits and alternatives for the proposed anesthesia with the patient or authorized representative who has indicated his/her understanding and acceptance.       Plan Discussed with: CRNA  Anesthesia Plan Comments:         Anesthesia Quick Evaluation

## 2018-07-10 NOTE — Op Note (Signed)
[facility] Patient Name: Gary Carter Procedure Date: 07/10/2018 1:44 PM MRN: [id number] Account #: [id number] Date of Birth: Feb 03, 1950 Admit Type: Outpatient Age: 68 Room: [facility] ENDO ROOM 1 Gender: Male Note Status: Finalized Procedure:            Upper GI endoscopy Indications:          Dysphagia, Heartburn, Follow-up of esophageal reflux Providers:            Manya Silvas, MD Referring MD:         Kirstie Peri. Caryn Section, MD (Referring MD) Medicines:            Propofol per Anesthesia Complications:        No immediate complications. Procedure:            Pre-Anesthesia Assessment:                       - After reviewing the risks and benefits, the patient                        was deemed in satisfactory condition to undergo the                        procedure.                       After obtaining informed consent, the endoscope was                        passed under direct vision. Throughout the procedure,                        the patient's blood pressure, pulse, and oxygen                        saturations were monitored continuously. The Endoscope                        was introduced through the mouth, and advanced to the                        second part of duodenum. The upper GI endoscopy was                        accomplished without difficulty. The patient tolerated                        the procedure well. Findings:      The examined esophagus was normal. GEJ 35cm.      Diffuse, scattered and patchy moderate inflammation characterized by       erythema, friability and granularity was found in the gastric antrum.       Biopsies were taken with a cold forceps for histology. Biopsies were       taken with a cold forceps for Helicobacter pylori testing.      The examined duodenum was normal. Impression:           - Normal esophagus.                       - Gastritis. Biopsied.                       -  Normal examined  duodenum. Recommendation:       - Await pathology results. Manya Silvas, MD 07/10/2018 2:12:11 PM This report has been signed electronically. Number of Addenda: 0 Note Initiated On: 07/10/2018 1:44 PM      [facility]

## 2018-07-10 NOTE — H&P (Signed)
Primary Care Physician:  Birdie Sons, MD Primary Gastroenterologist:  Dr. Vira Agar  Pre-Procedure History & Physical: HPI:  Gary Carter is a 68 y.o. male is here for an endoscopy.   Past Medical History:  Diagnosis Date  . Anxiety   . Barrett's esophagus   . GERD (gastroesophageal reflux disease)   . History of chicken pox 06/12/2014   DID have Chicken Pox. DID have Measles. DID have Mumps. DID have Rubella.    . History of dysphagia   . History of fatty infiltration of liver   . History of measles   . History of mumps   . History of rubella   . Hypertension   . Sleep apnea   . SOB (shortness of breath)     Past Surgical History:  Procedure Laterality Date  . ABDOMINAL SURGERY    . BACK SURGERY  12-04-2003   Lumbar fusion & C5-C6 fusion  . HERNIA REPAIR    . Myocardial perfusion scan  10/10/2006  . NECK SURGERY    . NERVE, TENDON AND ARTERY REPAIR Left 10/18/2012   Procedure: NERVE, TENDON AND ARTERY REPAIR;  Surgeon: Linna Hoff, MD;  Location: [place];  Service: Orthopedics;  Laterality: Left;  . NISSEN FUNDOPLICATION  8527  . UPPER GI ENDOSCOPY  04/09/2007   Dr. Tiffany Kocher; Mucosal changes suspicious for Barretts esophagits  . WOUND EXPLORATION Left 10/18/2012   Procedure: WOUND EXPLORATION;  Surgeon: Linna Hoff, MD;  Location: [place];  Service: Orthopedics;  Laterality: Left;    Prior to Admission medications   Medication Sig Start Date End Date Taking? Authorizing Provider  amLODipine (NORVASC) 10 MG tablet TAKE 1 TABLET BY MOUTH EVERY DAY 08/13/17  Yes Birdie Sons, MD  aspirin 325 MG tablet Take 325 mg by mouth once.  05/13/08  Yes [provider]  Cholecalciferol (VITAMIN D3 PO) Take daily by mouth.   Yes [provider]  citalopram (CELEXA) 40 MG tablet Take 20 mg by mouth daily.   Yes [provider]  clonazePAM (KLONOPIN) 0.5 MG tablet Take 1 tablet (0.5 mg total) by mouth every 8 (eight) hours as needed. for anxiety 01/25/18   Yes Birdie Sons, MD  Cyanocobalamin (VITAMIN B-12 PO) Take by mouth daily.   Yes [provider]  meloxicam (MOBIC) 7.5 MG tablet Take 1 tablet (7.5 mg total) by mouth daily as needed for pain. 06/19/18  Yes Birdie Sons, MD  metoprolol succinate (TOPROL-XL) 50 MG 24 hr tablet TAKE 1 TABLET (50 MG TOTAL) BY MOUTH DAILY. IN THE EVENING 02/15/18  Yes Birdie Sons, MD  omeprazole (PRILOSEC) 20 MG capsule Please specify directions, refills and quantity 08/28/17  Yes Birdie Sons, MD  spironolactone (ALDACTONE) 25 MG tablet TAKE 1 TABLET BY MOUTH EVERY DAY 04/30/18  Yes Birdie Sons, MD  telmisartan-hydrochlorothiazide (MICARDIS HCT) 80-25 MG tablet TAKE 1 TABLET BY MOUTH EVERY DAY 09/24/17  Yes Birdie Sons, MD  cyclobenzaprine (FLEXERIL) 5 MG tablet Take 1 tablet (5 mg total) by mouth 3 (three) times daily as needed for muscle spasms. 04/11/17   Birdie Sons, MD  HYDROcodone-acetaminophen (NORCO) 10-325 MG tablet Take 1 tablet by mouth every 6 (six) hours as needed. 06/29/18   Birdie Sons, MD  POTASSIUM PO Take daily by mouth.    [provider]  pravastatin (PRAVACHOL) 40 MG tablet TAKE 1 TABLET BY MOUTH EVERY DAY 06/20/18   Birdie Sons, MD  sildenafil (REVATIO)  20 MG tablet TAKE 1-2 TABLETS (20-40 MG TOTAL) BY MOUTH DAILY. 05/23/18   Birdie Sons, MD    Allergies as of 07/04/2018 - Review Complete 06/19/2018  Allergen Reaction Noted  . Hctz [hydrochlorothiazide]  03/15/2017    Family History  Problem Relation Age of Onset  . Hypertension Sister   . Bipolar disorder Sister   . Emphysema Mother   . Diabetes Daughter 3  . Bipolar disorder Brother   . Hypertension Brother   . Obesity Brother     Social History   Socioeconomic History  . Marital status: Divorced    Spouse name: Not on file  . Number of children: 1  . Years of education: Not on file  . Highest education level: Not on file  Occupational History  . Occupation: Works at [employer]  . Financial resource strain: Not on file  . Food insecurity    Worry: Not on file    Inability: Not on file  . Transportation needs    Medical: Not on file    Non-medical: Not on file  Tobacco Use  . Smoking status: Current Every Day Smoker    Packs/day: 1.00    Years: 5.00    Pack years: 5.00    Types: Cigarettes  . Smokeless tobacco: Never Used  . Tobacco comment: quit for 30 years  Substance and Sexual Activity  . Alcohol use: Yes    Alcohol/week: 0.0 standard drinks    Comment: drinks wine every night before bed  . Drug use: No    Comment: previously smoked marijuana years ago  . Sexual activity: Not on file  Lifestyle  . Physical activity    Days per week: Not on file    Minutes per session: Not on file  . Stress: Not on file  Relationships  . Social Herbalist on phone: Not on file    Gets together: Not on file    Attends religious service: Not on file    Active member of club or organization: Not on file    Attends meetings of clubs or organizations: Not on file    Relationship status: Not on file  . Intimate partner violence    Fear of current or ex partner: Not on file    Emotionally abused: Not on file    Physically abused: Not on file    Forced sexual activity: Not on file  Other Topics Concern  . Not on file  Social History Narrative  . Not on file    Review of Systems: See HPI, otherwise negative ROS  Physical Exam: BP (!) 123/94   Pulse (!) 57   Temp 97.9 F (36.6 C) (Oral)   Resp 16   Ht 5\' 9"  (1.753 m)   Wt 80.7 kg   SpO2 99%   BMI 26.29 kg/m  General:   Alert,  pleasant and cooperative in NAD Head:  Normocephalic and atraumatic. Neck:  Supple; no masses or thyromegaly. Lungs:  Clear throughout to auscultation.    Heart:  Regular rate and rhythm. Abdomen:  Soft, nontender and nondistended. Normal bowel sounds, without guarding, and without rebound.   Neurologic:  Alert and  oriented x4;  grossly normal  neurologically.  Impression/Plan: Gary Carter is here for an endoscopy to be performed for dysphagia and Barretts esophagus and GERD.  Risks, benefits, limitations, and alternatives regarding  endoscopy have been reviewed with the patient.  Questions have been answered.  All  parties agreeable.   Gaylyn Cheers, MD  07/10/2018, 1:51 PM

## 2018-07-10 NOTE — Transfer of Care (Signed)
Immediate Anesthesia Transfer of Care Note  Patient: GIANCARLO ASKREN  Procedure(s) Performed: ESOPHAGOGASTRODUODENOSCOPY (EGD) WITH PROPOFOL (N/A )  Patient Location: PACU  Anesthesia Type:MAC  Level of Consciousness: awake, alert  and oriented  Airway & Oxygen Therapy: Patient Spontanous Breathing and Patient connected to nasal cannula oxygen  Post-op Assessment: Report given to RN and Post -op Vital signs reviewed and stable  Post vital signs: Reviewed and stable  Last Vitals:  Vitals Value Taken Time  BP    Temp    Pulse 58 07/10/18 1412  Resp 21 07/10/18 1412  SpO2 92 % 07/10/18 1412  Vitals shown include unvalidated device data.  Last Pain:  Vitals:   07/10/18 1317  TempSrc: Oral         Complications: No apparent anesthesia complications

## 2018-07-11 ENCOUNTER — Encounter: Payer: Self-pay | Admitting: Unknown Physician Specialty

## 2018-07-11 ENCOUNTER — Other Ambulatory Visit: Payer: Self-pay

## 2018-07-11 DIAGNOSIS — G8929 Other chronic pain: Secondary | ICD-10-CM

## 2018-07-11 NOTE — Telephone Encounter (Signed)
Dr. Caryn Section patient. Patient calling requesting refill on the following medication HYDROcodone-acetaminophen (NORCO) 10-325 MG tablet  [id number] [place] [place] [facility] [address]

## 2018-07-12 MED ORDER — HYDROCODONE-ACETAMINOPHEN 10-325 MG PO TABS: 1.0000 | ORAL_TABLET | Freq: Four times a day (QID) | ORAL | 0 refills | Status: DC | PRN

## 2018-07-12 NOTE — Telephone Encounter (Signed)
Please review for Dr. Fisher.   Thanks,   -Deklen Popelka  

## 2018-07-13 ENCOUNTER — Other Ambulatory Visit: Payer: Self-pay | Admitting: Family Medicine

## 2018-07-13 DIAGNOSIS — I10 Essential (primary) hypertension: Secondary | ICD-10-CM

## 2018-07-13 LAB — SURGICAL PATHOLOGY

## 2018-07-20 ENCOUNTER — Encounter: Payer: Self-pay | Admitting: Family Medicine

## 2018-07-20 DIAGNOSIS — K2970 Gastritis, unspecified, without bleeding: Secondary | ICD-10-CM | POA: Insufficient documentation

## 2018-07-21 DIAGNOSIS — K449 Diaphragmatic hernia without obstruction or gangrene: Secondary | ICD-10-CM | POA: Diagnosis not present

## 2018-07-21 DIAGNOSIS — K2270 Barrett's esophagus without dysplasia: Secondary | ICD-10-CM | POA: Diagnosis not present

## 2018-07-21 DIAGNOSIS — K219 Gastro-esophageal reflux disease without esophagitis: Secondary | ICD-10-CM | POA: Diagnosis not present

## 2018-07-21 DIAGNOSIS — R1310 Dysphagia, unspecified: Secondary | ICD-10-CM | POA: Diagnosis not present

## 2018-07-25 ENCOUNTER — Other Ambulatory Visit: Payer: Self-pay | Admitting: Family Medicine

## 2018-07-25 DIAGNOSIS — G8929 Other chronic pain: Secondary | ICD-10-CM

## 2018-07-25 DIAGNOSIS — M5441 Lumbago with sciatica, right side: Secondary | ICD-10-CM

## 2018-07-25 DIAGNOSIS — G4700 Insomnia, unspecified: Secondary | ICD-10-CM

## 2018-07-25 MED ORDER — HYDROCODONE-ACETAMINOPHEN 10-325 MG PO TABS: 1.0000 | ORAL_TABLET | Freq: Four times a day (QID) | ORAL | 0 refills | Status: DC | PRN

## 2018-07-25 NOTE — Telephone Encounter (Signed)
Pt contacted office for refill request on the following medications:  HYDROcodone-acetaminophen (NORCO) 10-325 MG tablet  [facility] Please advise. Thanks TNP

## 2018-08-08 ENCOUNTER — Other Ambulatory Visit: Payer: Self-pay | Admitting: Family Medicine

## 2018-08-08 DIAGNOSIS — G8929 Other chronic pain: Secondary | ICD-10-CM

## 2018-08-08 NOTE — Telephone Encounter (Signed)
Pt needs a refill on   Hydrocodone 10-325  [facility]  [place]

## 2018-08-10 MED ORDER — HYDROCODONE-ACETAMINOPHEN 10-325 MG PO TABS: 1.0000 | ORAL_TABLET | Freq: Four times a day (QID) | ORAL | 0 refills | Status: DC | PRN

## 2018-08-10 NOTE — Telephone Encounter (Signed)
Pt calling back today for 2nd time needing a refill on his:  HYDROcodone-acetaminophen ([place]) 10-325 MG tablet  Please fill at:  [id number] [place], [place] [phone] (Phone) [phone] (Fax)   Thanks, [employer]

## 2018-08-23 ENCOUNTER — Other Ambulatory Visit: Payer: Self-pay | Admitting: Family Medicine

## 2018-08-23 DIAGNOSIS — G8929 Other chronic pain: Secondary | ICD-10-CM

## 2018-08-23 DIAGNOSIS — M5441 Lumbago with sciatica, right side: Secondary | ICD-10-CM

## 2018-08-23 MED ORDER — HYDROCODONE-ACETAMINOPHEN 10-325 MG PO TABS: 1.0000 | ORAL_TABLET | Freq: Four times a day (QID) | ORAL | 0 refills | Status: DC | PRN

## 2018-08-23 NOTE — Telephone Encounter (Signed)
Pt needs refill on Hydrocodone 10-325  [facility]  [place]

## 2018-09-07 ENCOUNTER — Telehealth: Payer: Self-pay | Admitting: Family Medicine

## 2018-09-07 DIAGNOSIS — G8929 Other chronic pain: Secondary | ICD-10-CM

## 2018-09-07 MED ORDER — HYDROCODONE-ACETAMINOPHEN 10-325 MG PO TABS: 1.0000 | ORAL_TABLET | Freq: Four times a day (QID) | ORAL | 0 refills | Status: DC | PRN

## 2018-09-07 NOTE — Telephone Encounter (Signed)
Pt needs refill  Hydrocodone 10-325  [facility]  [place]

## 2018-09-13 ENCOUNTER — Other Ambulatory Visit: Payer: Self-pay | Admitting: Family Medicine

## 2018-09-13 DIAGNOSIS — S46919A Strain of unspecified muscle, fascia and tendon at shoulder and upper arm level, unspecified arm, initial encounter: Secondary | ICD-10-CM

## 2018-09-15 ENCOUNTER — Telehealth: Payer: Self-pay

## 2018-09-15 MED ORDER — FLUTICASONE PROPIONATE 50 MCG/ACT NA SUSP: 2.0000 | Freq: Every day | NASAL | 0 refills | Status: DC

## 2018-09-15 MED ORDER — AMOXICILLIN-POT CLAVULANATE 875-125 MG PO TABS: 1.0000 | ORAL_TABLET | Freq: Two times a day (BID) | ORAL | 0 refills | Status: AC

## 2018-09-15 NOTE — Telephone Encounter (Signed)
Patient is experiencing bloody nasal drainage, swollen face, yellow/green drainage, body aches that started 2 days ago. He would like an antibiotic and nasal spray called to [facility].

## 2018-09-15 NOTE — Telephone Encounter (Signed)
Please review. There are no available appts today. Would you like to work the patient in?

## 2018-09-21 ENCOUNTER — Other Ambulatory Visit: Payer: Self-pay | Admitting: Family Medicine

## 2018-09-21 DIAGNOSIS — G8929 Other chronic pain: Secondary | ICD-10-CM

## 2018-09-21 MED ORDER — HYDROCODONE-ACETAMINOPHEN 10-325 MG PO TABS: 1.0000 | ORAL_TABLET | Freq: Four times a day (QID) | ORAL | 0 refills | Status: DC | PRN

## 2018-09-21 NOTE — Telephone Encounter (Signed)
Pt needing refill on:  HYDROcodone-acetaminophen (NORCO) 10-325 MG tablet  Please fill at:  [id number] [place], [place] [phone] (Phone) [phone] (Fax)   Thanks, [employer]

## 2018-10-01 DIAGNOSIS — Z23 Encounter for immunization: Secondary | ICD-10-CM | POA: Diagnosis not present

## 2018-10-04 ENCOUNTER — Other Ambulatory Visit: Payer: Self-pay | Admitting: Family Medicine

## 2018-10-04 DIAGNOSIS — G8929 Other chronic pain: Secondary | ICD-10-CM

## 2018-10-04 NOTE — Telephone Encounter (Signed)
Pt needing a refill:  HYDROcodone-acetaminophen (NORCO) 10-325 MG tablet fluticasone (FLONASE) 50 MCG/ACT nasal spray  Please fill at:  [id number] [place], [place] [phone] (Phone) [phone] (Fax)   Thanks, [employer]

## 2018-10-05 MED ORDER — HYDROCODONE-ACETAMINOPHEN 10-325 MG PO TABS: 1.0000 | ORAL_TABLET | Freq: Four times a day (QID) | ORAL | 0 refills | Status: DC | PRN

## 2018-10-08 ENCOUNTER — Other Ambulatory Visit: Payer: Self-pay | Admitting: Family Medicine

## 2018-10-14 ENCOUNTER — Other Ambulatory Visit: Payer: Self-pay | Admitting: Family Medicine

## 2018-10-18 ENCOUNTER — Telehealth: Payer: Self-pay | Admitting: Family Medicine

## 2018-10-18 DIAGNOSIS — M5441 Lumbago with sciatica, right side: Secondary | ICD-10-CM

## 2018-10-18 DIAGNOSIS — G8929 Other chronic pain: Secondary | ICD-10-CM

## 2018-10-18 MED ORDER — HYDROCODONE-ACETAMINOPHEN 10-325 MG PO TABS: 1.0000 | ORAL_TABLET | Freq: Four times a day (QID) | ORAL | 0 refills | Status: DC | PRN

## 2018-10-18 NOTE — Telephone Encounter (Signed)
Please review, last office visit to address chronic pain 02/14/18, medication was last filled 10/05/18.KW

## 2018-10-18 NOTE — Telephone Encounter (Signed)
°  Pt needing a refill on: HYDROcodone-acetaminophen (NORCO) 10-325 MG tablet   [facility] fill at: [id number] [place], [place] [phone] (Phone) [phone] (Fax)     Thanks, [employer]

## 2018-11-02 ENCOUNTER — Other Ambulatory Visit: Payer: Self-pay | Admitting: Family Medicine

## 2018-11-02 DIAGNOSIS — G8929 Other chronic pain: Secondary | ICD-10-CM

## 2018-11-02 NOTE — Telephone Encounter (Signed)
Pt needs refill  Hydrocodone 10-325    [facility]  [place]

## 2018-11-03 MED ORDER — HYDROCODONE-ACETAMINOPHEN 10-325 MG PO TABS: 1.0000 | ORAL_TABLET | Freq: Four times a day (QID) | ORAL | 0 refills | Status: DC | PRN

## 2018-11-03 NOTE — Telephone Encounter (Signed)
Last OV was on 06/19/2018. Last RF 10/18/2018

## 2018-11-06 ENCOUNTER — Other Ambulatory Visit: Payer: Self-pay | Admitting: Family Medicine

## 2018-11-15 ENCOUNTER — Other Ambulatory Visit: Payer: Self-pay | Admitting: Family Medicine

## 2018-11-15 DIAGNOSIS — G8929 Other chronic pain: Secondary | ICD-10-CM

## 2018-11-15 NOTE — Telephone Encounter (Signed)
Pt needing a refil on:  HYDROcodone-acetaminophen (NORCO) 10-325 MG tablet  Please fill at:  [id number] [place], [place] [phone] (Phone) [phone] (Fax)    Thanks, [employer]

## 2018-11-18 MED ORDER — HYDROCODONE-ACETAMINOPHEN 10-325 MG PO TABS: 1.0000 | ORAL_TABLET | Freq: Four times a day (QID) | ORAL | 0 refills | Status: DC | PRN

## 2018-11-30 ENCOUNTER — Other Ambulatory Visit: Payer: Self-pay

## 2018-11-30 ENCOUNTER — Other Ambulatory Visit: Payer: Self-pay | Admitting: Family Medicine

## 2018-11-30 DIAGNOSIS — Z20828 Contact with and (suspected) exposure to other viral communicable diseases: Secondary | ICD-10-CM | POA: Diagnosis not present

## 2018-11-30 DIAGNOSIS — G8929 Other chronic pain: Secondary | ICD-10-CM

## 2018-11-30 DIAGNOSIS — Z20822 Contact with and (suspected) exposure to covid-19: Secondary | ICD-10-CM

## 2018-11-30 NOTE — Telephone Encounter (Signed)
Requested medication (s) are due for refill today: yes  Requested medication (s) are on the active medication list: yes  Last refill: 11/18/2018  Future visit scheduled: no  Notes to clinic:  Refill cannot be delegated    Requested Prescriptions  Pending Prescriptions Disp Refills   HYDROcodone-acetaminophen (NORCO) 10-325 MG tablet 60 tablet 0    Sig: Take 1 tablet by mouth every 6 (six) hours as needed.     Not Delegated - Analgesics:  Opioid Agonist Combinations Failed - 11/30/2018  8:57 AM      Failed - This refill cannot be delegated      Failed - Urine Drug Screen completed in last 360 days.      Passed - Valid encounter within last 6 months    Recent Outpatient Visits          5 months ago Essential (primary) hypertension   [facility] Birdie Sons, MD   6 months ago Mass of left breast   [facility] Birdie Sons, MD   8 months ago Left maxillary sinusitis   [place], Vickki Muff, [place]   9 months ago Welcome to [employer] preventive visit   [facility] Fisher, Kirstie Peri, MD   1 year ago Anxiety disorder, unspecified type   [facility] Birdie Sons, MD

## 2018-11-30 NOTE — Telephone Encounter (Signed)
Medication refill: HYDROcodone-acetaminophen (NORCO) 10-325 MG tablet [id number]    [facility]  [id number] [place], [place] [phone] (Phone) [phone] (Fax)   Pt aware of turn around time

## 2018-12-01 MED ORDER — HYDROCODONE-ACETAMINOPHEN 10-325 MG PO TABS: 1.0000 | ORAL_TABLET | Freq: Four times a day (QID) | ORAL | 0 refills | Status: DC | PRN

## 2018-12-01 NOTE — Telephone Encounter (Signed)
Patient wanted PCP to be aware he may run out of the mediation mentioned below over the weekend

## 2018-12-03 LAB — NOVEL CORONAVIRUS, NAA: SARS-CoV-2, NAA: NOT DETECTED

## 2018-12-12 NOTE — Progress Notes (Signed)
Patient: Gary Carter Male    DOB: 09-Jun-1950   68 y.o.   MRN: [id number] Visit Date: 12/12/2018  Today's Provider: Mar Daring, PA-C   No chief complaint on file.  Subjective:    Virtual Visit via Telephone Note  I connected with Gary Carter on 12/12/18 at  8:00 AM EST by telephone and verified that I am speaking with the correct person using two identifiers.  Location: Patient: Home Provider: [facility]   I discussed the limitations, risks, security and privacy concerns of performing an evaluation and management service by telephone and the availability of in person appointments. I also discussed with the patient that there may be a patient responsible charge related to this service. The patient expressed understanding and agreed to proceed.   URI  This is a new problem. Episode onset: before 11/27/2018. The problem has been gradually worsening. There has been no fever. Associated symptoms include congestion, coughing (nighttime), ear pain, headaches and sinus pain. Pertinent negatives include no rhinorrhea, sore throat or wheezing. Associated symptoms comments: Bloody mucus, post nasal drainage. He has tried NSAIDs for the symptoms. The treatment provided mild relief.  Patient tested negative for COVID on 11/30/2018.   Allergies  Allergen Reactions  . Hctz [Hydrochlorothiazide]     Leg cramps     Current Outpatient Medications:  .  amLODipine (NORVASC) 10 MG tablet, TAKE 1 TABLET BY MOUTH EVERY DAY, Disp: 90 tablet, Rfl: 4 .  aspirin 325 MG tablet, Take 325 mg by mouth once. , Disp: , Rfl:  .  Cholecalciferol (VITAMIN D3 PO), Take daily by mouth., Disp: , Rfl:  .  citalopram (CELEXA) 40 MG tablet, TAKE 1 TABLET (40 MG TOTAL) DAILY BY MOUTH., Disp: 90 tablet, Rfl: 4 .  clonazePAM (KLONOPIN) 0.5 MG tablet, TAKE 1 TABLET (0.5 MG TOTAL) BY MOUTH EVERY 8 (EIGHT) HOURS AS NEEDED. FOR ANXIETY, Disp: 90 tablet, Rfl: 3 .  Cyanocobalamin (VITAMIN B-12 PO), Take by mouth  daily., Disp: , Rfl:  .  cyclobenzaprine (FLEXERIL) 5 MG tablet, Take 1 tablet (5 mg total) by mouth 3 (three) times daily as needed for muscle spasms., Disp: 30 tablet, Rfl: 1 .  fluticasone (FLONASE) 50 MCG/ACT nasal spray, SPRAY 2 SPRAYS INTO EACH NOSTRIL EVERY DAY, Disp: 16 mL, Rfl: 5 .  HYDROcodone-acetaminophen (NORCO) 10-325 MG tablet, Take 1 tablet by mouth every 6 (six) hours as needed., Disp: 60 tablet, Rfl: 0 .  meloxicam (MOBIC) 7.5 MG tablet, TAKE 1 TABLET BY MOUTH DAILY AS NEEDED FOR PAIN, Disp: 30 tablet, Rfl: 2 .  metoprolol succinate (TOPROL-XL) 50 MG 24 hr tablet, TAKE 1 TABLET (50 MG TOTAL) BY MOUTH DAILY. IN THE EVENING, Disp: 90 tablet, Rfl: 4 .  omeprazole (PRILOSEC) 20 MG capsule, Please specify directions, refills and quantity, Disp: 30 capsule, Rfl: 12 .  POTASSIUM PO, Take daily by mouth., Disp: , Rfl:  .  pravastatin (PRAVACHOL) 40 MG tablet, TAKE 1 TABLET BY MOUTH EVERY DAY, Disp: 90 tablet, Rfl: 4 .  sildenafil (REVATIO) 20 MG tablet, TAKE 1-2 TABLETS (20-40 MG TOTAL) BY MOUTH DAILY., Disp: 180 tablet, Rfl: 4 .  spironolactone (ALDACTONE) 25 MG tablet, TAKE 1 TABLET BY MOUTH EVERY DAY, Disp: 90 tablet, Rfl: 3 .  telmisartan-hydrochlorothiazide (MICARDIS HCT) 80-25 MG tablet, TAKE 1 TABLET BY MOUTH EVERY DAY, Disp: 90 tablet, Rfl: 3  Review of Systems  Constitutional: Negative.   HENT: Positive for congestion, ear pain, postnasal drip, sinus pressure and  sinus pain. Negative for rhinorrhea, sore throat, tinnitus, trouble swallowing and voice change.   Respiratory: Positive for cough (nighttime). Negative for chest tightness, shortness of breath and wheezing.   Cardiovascular: Negative.   Musculoskeletal: Negative.   Neurological: Positive for headaches. Negative for dizziness.    Social History   Tobacco Use  . Smoking status: Current Every Day Smoker    Packs/day: 1.00    Years: 5.00    Pack years: 5.00    Types: Cigarettes  . Smokeless tobacco: Never Used   . Tobacco comment: quit for 30 years  Substance Use Topics  . Alcohol use: Yes    Alcohol/week: 0.0 standard drinks    Comment: drinks wine every night before bed      Objective:   There were no vitals taken for this visit. There were no vitals filed for this visit.There is no height or weight on file to calculate BMI.   Physical Exam Vitals signs reviewed.  Constitutional:      General: He is not in acute distress. Pulmonary:     Effort: No respiratory distress.  Neurological:     Mental Status: He is alert.      No results found for any visits on 12/13/18.     Assessment & Plan      1. Acute non-recurrent pansinusitis Worsening symptoms that have not responded to OTC medications. Will give augmentin as below. Continue allergy medications. Stay well hydrated and get plenty of rest. Call if no symptom improvement or if symptoms worsen. - amoxicillin-clavulanate (AUGMENTIN) 875-125 MG tablet; Take 1 tablet by mouth 2 (two) times daily.  Dispense: 20 tablet; Refill: 0  2. Cough Tessalon perles for cough as below. Push fluids.  - benzonatate (TESSALON) 200 MG capsule; Take 1 capsule (200 mg total) by mouth 3 (three) times daily as needed.  Dispense: 30 capsule; Refill: 0   I discussed the assessment and treatment plan with the patient. The patient was provided an opportunity to ask questions and all were answered. The patient agreed with the plan and demonstrated an understanding of the instructions.   The patient was advised to call back or seek an in-person evaluation if the symptoms worsen or if the condition fails to improve as anticipated.  I provided 8 minutes of non-face-to-face time during this encounter.     Mar Daring, PA-C  [facility]

## 2018-12-13 ENCOUNTER — Ambulatory Visit (INDEPENDENT_AMBULATORY_CARE_PROVIDER_SITE_OTHER): Payer: Medicare Other | Admitting: Physician Assistant

## 2018-12-13 ENCOUNTER — Other Ambulatory Visit: Payer: Self-pay | Admitting: Family Medicine

## 2018-12-13 ENCOUNTER — Encounter: Payer: Self-pay | Admitting: Physician Assistant

## 2018-12-13 DIAGNOSIS — R059 Cough, unspecified: Secondary | ICD-10-CM

## 2018-12-13 DIAGNOSIS — M5441 Lumbago with sciatica, right side: Secondary | ICD-10-CM

## 2018-12-13 DIAGNOSIS — J0140 Acute pansinusitis, unspecified: Secondary | ICD-10-CM | POA: Diagnosis not present

## 2018-12-13 DIAGNOSIS — G8929 Other chronic pain: Secondary | ICD-10-CM

## 2018-12-13 DIAGNOSIS — R05 Cough: Secondary | ICD-10-CM | POA: Diagnosis not present

## 2018-12-13 MED ORDER — AMOXICILLIN-POT CLAVULANATE 875-125 MG PO TABS: 1.0000 | ORAL_TABLET | Freq: Two times a day (BID) | ORAL | 0 refills | Status: DC

## 2018-12-13 MED ORDER — HYDROCODONE-ACETAMINOPHEN 10-325 MG PO TABS: 1.0000 | ORAL_TABLET | Freq: Four times a day (QID) | ORAL | 0 refills | Status: DC | PRN

## 2018-12-13 MED ORDER — BENZONATATE 200 MG PO CAPS: 200.0000 mg | ORAL_CAPSULE | Freq: Three times a day (TID) | ORAL | 0 refills | Status: DC | PRN

## 2018-12-13 NOTE — Telephone Encounter (Signed)
Requested medication (s) are due for refill today: yes  Requested medication (s) are on the active medication list: yes  Last refill:  12/01/2018  Future visit scheduled: no  Notes to clinic:  Refill cannot be delegated    Requested Prescriptions  Pending Prescriptions Disp Refills   HYDROcodone-acetaminophen (NORCO) 10-325 MG tablet 60 tablet 0    Sig: Take 1 tablet by mouth every 6 (six) hours as needed.     Not Delegated - Analgesics:  Opioid Agonist Combinations Failed - 12/13/2018 10:16 AM      Failed - This refill cannot be delegated      Failed - Urine Drug Screen completed in last 360 days.      Passed - Valid encounter within last 6 months    Recent Outpatient Visits          Today Acute non-recurrent pansinusitis   [facility] [place], [place], [place]   5 months ago Essential (primary) hypertension   [facility] Birdie Sons, MD   7 months ago Mass of left breast   [facility] Birdie Sons, MD   9 months ago Left maxillary sinusitis   [facility] Chrismon, Vickki Muff, [place]   10 months ago Welcome to [employer] preventive visit   [facility] Caryn Section, Kirstie Peri, MD

## 2018-12-13 NOTE — Patient Instructions (Signed)

## 2018-12-13 NOTE — Telephone Encounter (Signed)
Medication Refill - Medication: HYDROcodone-acetaminophen (NORCO) 10-325 MG tablet   Has the patient contacted their pharmacy? No. (Agent: If no, request that the patient contact the pharmacy for the refill.) (Agent: If yes, when and what did the pharmacy advise?)Controlled  Preferred Pharmacy (with phone number or street name): [id number] [place], [place] [phone] (Phone) [phone] (Fax)     Agent: Please be advised that RX refills may take up to 3 business days. We ask that you follow-up with your pharmacy.

## 2018-12-13 NOTE — Telephone Encounter (Signed)
Dr. Caryn Section is out of the office until 12/18/2018. Please review PEC request.

## 2018-12-27 ENCOUNTER — Other Ambulatory Visit: Payer: Self-pay | Admitting: Family Medicine

## 2018-12-27 DIAGNOSIS — M5441 Lumbago with sciatica, right side: Secondary | ICD-10-CM

## 2018-12-27 DIAGNOSIS — G8929 Other chronic pain: Secondary | ICD-10-CM

## 2018-12-27 NOTE — Telephone Encounter (Signed)
Pt request refill   HYDROcodone-acetaminophen (NORCO) 10-325 MG tablet  [id number] [place], [place] - [address] Phone:  [phone]  Fax:  [phone]      

## 2018-12-28 MED ORDER — HYDROCODONE-ACETAMINOPHEN 10-325 MG PO TABS: 1.0000 | ORAL_TABLET | Freq: Four times a day (QID) | ORAL | 0 refills | Status: DC | PRN

## 2019-01-10 ENCOUNTER — Other Ambulatory Visit: Payer: Self-pay | Admitting: Family Medicine

## 2019-01-10 DIAGNOSIS — G8929 Other chronic pain: Secondary | ICD-10-CM

## 2019-01-10 DIAGNOSIS — M5441 Lumbago with sciatica, right side: Secondary | ICD-10-CM

## 2019-01-10 MED ORDER — HYDROCODONE-ACETAMINOPHEN 10-325 MG PO TABS: 1.0000 | ORAL_TABLET | Freq: Four times a day (QID) | ORAL | 0 refills | Status: DC | PRN

## 2019-01-10 NOTE — Telephone Encounter (Signed)
Requested medication (s) are due for refill today: yes  Requested medication (s) are on the active medication list: yes  Last refill:  12/28/2018  Future visit scheduled: no  Notes to clinic:  This refill cannot be delegated    Requested Prescriptions  Pending Prescriptions Disp Refills   HYDROcodone-acetaminophen (NORCO) 10-325 MG tablet 60 tablet 0    Sig: Take 1 tablet by mouth every 6 (six) hours as needed.      Not Delegated - Analgesics:  Opioid Agonist Combinations Failed - 01/10/2019  8:45 AM      Failed - This refill cannot be delegated      Failed - Urine Drug Screen completed in last 360 days.      Failed - Valid encounter within last 6 months    Recent Outpatient Visits           4 weeks ago Acute non-recurrent pansinusitis   [facility] [place], [place], [place]   6 months ago Essential (primary) hypertension   [facility] Birdie Sons, MD   7 months ago Mass of left breast   [facility] Birdie Sons, MD   9 months ago Left maxillary sinusitis   [facility] Chrismon, Vickki Muff, [place]   11 months ago Welcome to [employer] preventive visit   [facility] Caryn Section, Kirstie Peri, MD

## 2019-01-10 NOTE — Telephone Encounter (Signed)
Medication Refill - Medication: HYDROcodone-acetaminophen (NORCO) 10-325 MG tablet   Has the patient contacted their pharmacy? Yes.   (Agent: If no, request that the patient contact the pharmacy for the refill.) (Agent: If yes, when and what did the pharmacy advise?)  Preferred Pharmacy (with phone number or street name):[id number] [place], [place]: Please be advised that RX refills may take up to 3 business days. We ask that you follow-up with your pharmacy.

## 2019-01-23 ENCOUNTER — Other Ambulatory Visit: Payer: Self-pay | Admitting: Family Medicine

## 2019-01-23 DIAGNOSIS — G8929 Other chronic pain: Secondary | ICD-10-CM

## 2019-01-23 MED ORDER — HYDROCODONE-ACETAMINOPHEN 10-325 MG PO TABS: 1.0000 | ORAL_TABLET | Freq: Four times a day (QID) | ORAL | 0 refills | Status: DC | PRN

## 2019-01-23 NOTE — Telephone Encounter (Signed)
RX REFILL HYDROcodone-acetaminophen (NORCO) 10-325 MG tablet  [facility]Y [id number] [place], [place] Phone:  [phone]  Fax:  [phone]

## 2019-01-30 ENCOUNTER — Other Ambulatory Visit: Payer: Self-pay | Admitting: Family Medicine

## 2019-01-30 DIAGNOSIS — G4700 Insomnia, unspecified: Secondary | ICD-10-CM

## 2019-01-30 NOTE — Telephone Encounter (Signed)
Requested medication (s) are due for refill today: yes  Requested medication (s) are on the active medication list: yes  Last refill:  11/13/2018  Future visit scheduled: no  Notes to clinic:  not delegated    Requested Prescriptions  Pending Prescriptions Disp Refills   clonazePAM (KLONOPIN) 0.5 MG tablet [Pharmacy Med Name: CLONAZEPAM 0.5 MG TABLET] 90 tablet     Sig: TAKE 1 TABLET (0.5 MG TOTAL) BY MOUTH EVERY 8 HOURS AS NEEDED FOR ANXIETY.      Not Delegated - Psychiatry:  Anxiolytics/Hypnotics Failed - 01/30/2019  2:54 PM      Failed - This refill cannot be delegated      Failed - Urine Drug Screen completed in last 360 days.      Failed - Valid encounter within last 6 months    Recent Outpatient Visits           1 month ago Acute non-recurrent pansinusitis   [facility], Clearnce Sorrel, [place]   7 months ago Essential (primary) hypertension   [facility] Birdie Sons, MD   8 months ago Mass of left breast   [facility] Birdie Sons, MD   10 months ago Left maxillary sinusitis   [facility] [place], Vickki Muff, [place]   11 months ago [employer] preventive visit   [facility] Birdie Sons, MD               Signed Prescriptions Disp Refills   fluticasone (FLONASE) 50 MCG/ACT nasal spray 48 mL 1    Sig: SPRAY 2 SPRAYS INTO EACH NOSTRIL EVERY DAY      Ear, Nose, and Throat: Nasal Preparations - Corticosteroids Passed - 01/30/2019  2:54 PM      Passed - Valid encounter within last 12 months    Recent Outpatient Visits           1 month ago Acute non-recurrent pansinusitis   [facility] [place], [place], [place]   7 months ago Essential (primary) hypertension   [facility] Birdie Sons, MD   8 months ago Mass of left breast   [facility] Birdie Sons, MD   10 months ago Left maxillary sinusitis   [place], Vickki Muff, [place]   11 months ago Welcome to [employer] preventive visit   [facility] Caryn Section, Kirstie Peri, MD

## 2019-01-30 NOTE — Telephone Encounter (Signed)
Requested Prescriptions  Pending Prescriptions Disp Refills  . fluticasone (FLONASE) 50 MCG/ACT nasal spray [Pharmacy Med Name: FLUTICASONE PROP 50 MCG SPRAY] 48 mL 1    Sig: SPRAY 2 SPRAYS INTO EACH NOSTRIL EVERY DAY     Ear, Nose, and Throat: Nasal Preparations - Corticosteroids Passed - 01/30/2019  2:54 PM      Passed - Valid encounter within last 12 months    Recent Outpatient Visits          1 month ago Acute non-recurrent pansinusitis   [facility] [place], [place], [place]   7 months ago Essential (primary) hypertension   [facility] Birdie Sons, MD   8 months ago Mass of left breast   [facility] Birdie Sons, MD   10 months ago Left maxillary sinusitis   [facility] [place], Vickki Muff, [place]   11 months ago [facility] to [employer] preventive visit   [place], Kirstie Peri, MD             . clonazePAM (KLONOPIN) 0.5 MG tablet [Pharmacy Med Name: CLONAZEPAM 0.5 MG TABLET] 90 tablet     Sig: TAKE 1 TABLET (0.5 MG TOTAL) BY MOUTH EVERY 8 HOURS AS NEEDED FOR ANXIETY.     Not Delegated - Psychiatry:  Anxiolytics/Hypnotics Failed - 01/30/2019  2:54 PM      Failed - This refill cannot be delegated      Failed - Urine Drug Screen completed in last 360 days.      Failed - Valid encounter within last 6 months    Recent Outpatient Visits          1 month ago Acute non-recurrent pansinusitis   [facility] [place], Clearnce Sorrel, [place]   7 months ago Essential (primary) hypertension   [facility] Birdie Sons, MD   8 months ago Mass of left breast   [facility] Birdie Sons, MD   10 months ago Left maxillary sinusitis   [facility] [place], Vickki Muff, [place]   11 months ago Welcome to [employer] preventive visit   [facility] Caryn Section, Kirstie Peri, MD

## 2019-01-31 ENCOUNTER — Telehealth: Payer: Self-pay | Admitting: Family Medicine

## 2019-01-31 NOTE — Telephone Encounter (Signed)
Pt stated he has had headaches/migraines that do not go away with OTC medicine. He is also having drainage and does not know if this is associated with the headaches. He would like to know if Dr. Caryn Section has any recommendations to help with this. Scheduled for telephone appt on 02/02/19.

## 2019-01-31 NOTE — Telephone Encounter (Signed)
Called and notified patient about keeping his appointment Friday 1/22 to be evaluated in person. He stated that he had spoken with Roshena and that he had taken his blood pressure 3 times and each time it was 137/84. He will keep his appointment Friday 1/22 and was informed to go to the ED if anything worsens.

## 2019-01-31 NOTE — Telephone Encounter (Signed)
He will need to be evaluated on Friday. I do not have any recommendations without evaluating him.

## 2019-01-31 NOTE — Telephone Encounter (Signed)
I called and spoke with patient. He states for the past 3 months he has kept a dull headache that does not go away after taking OTC pain relievers. Patient was seen for by [facility] for sinus infection and cough on  12/13/2018 and was prescribed Augmentin. Patient states he wasn't  able to take the full course of antibiotic because it upset his stomach. Patient states the sinus symptoms never resolved. He has a telephone visit with Dr. Caryn Section on Friday, but wants to know if there is anything someone can recommend to relive his headache? He has not checked his blood pressure lately , but agrees to have his girl friend come by his house to check it.

## 2019-02-02 ENCOUNTER — Encounter: Payer: Self-pay | Admitting: Family Medicine

## 2019-02-02 ENCOUNTER — Ambulatory Visit (INDEPENDENT_AMBULATORY_CARE_PROVIDER_SITE_OTHER): Payer: Medicare Other | Admitting: Family Medicine

## 2019-02-02 DIAGNOSIS — R519 Headache, unspecified: Secondary | ICD-10-CM

## 2019-02-02 DIAGNOSIS — I10 Essential (primary) hypertension: Secondary | ICD-10-CM

## 2019-02-02 DIAGNOSIS — J0111 Acute recurrent frontal sinusitis: Secondary | ICD-10-CM

## 2019-02-02 MED ORDER — CANDESARTAN CILEXETIL 32 MG PO TABS: 32.0000 mg | ORAL_TABLET | Freq: Every day | ORAL | 1 refills | Status: DC

## 2019-02-02 MED ORDER — AZITHROMYCIN 250 MG PO TABS: ORAL_TABLET | ORAL | 0 refills | Status: AC

## 2019-02-02 MED ORDER — HYDROCHLOROTHIAZIDE 25 MG PO TABS: 25.0000 mg | ORAL_TABLET | Freq: Every day | ORAL | 3 refills | Status: DC

## 2019-02-02 NOTE — Progress Notes (Signed)
Patient: Gary Carter Male    DOB: [birth year]   69 y.o.   MRN: [id number] Visit Date: 02/02/2019  Today's Provider: Lelon Huh, MD   Chief Complaint  Patient presents with  . Headache   Subjective:    Virtual Visit via Video Note  I connected with Gary Carter on 02/04/19 at 10:20 AM EST by a video enabled telemedicine application and verified that I am speaking with the correct person using two identifiers.   I discussed the limitations of evaluation and management by telemedicine and the availability of in person appointments. The patient expressed understanding and agreed to proceed.   Headache  This is a new problem. Episode onset: 3 months ago. The problem occurs intermittently. The problem has been waxing and waning. Pertinent negatives include no abdominal pain, coughing, fever, nausea, photophobia, sore throat or vomiting. He has tried acetaminophen (also Excedrin) for the symptoms.  Patient has been more stressed lately which he feels is contributing to the headaches, but they are also consistent with prior sinus infections Has been having blood draining from sinus most mornings. Was treated with Augmentin a few weeks ago, but only took for a few days due to GI side effects.    He also states his insurance no longer covers telmisartan-hctz and needs to be changes to another medication. He is tolerating it well, but last BP was mildly elevated on this medication.   Allergies  Allergen Reactions  . Hctz [Hydrochlorothiazide]     Leg cramps     Current Outpatient Medications:  .  amLODipine (NORVASC) 10 MG tablet, TAKE 1 TABLET BY MOUTH EVERY DAY, Disp: 90 tablet, Rfl: 4 .  aspirin 325 MG tablet, Take 325 mg by mouth once. , Disp: , Rfl:  .  Cholecalciferol (VITAMIN D3 PO), Take daily by mouth., Disp: , Rfl:  .  citalopram (CELEXA) 40 MG tablet, TAKE 1 TABLET (40 MG TOTAL) DAILY BY MOUTH., Disp: 90 tablet, Rfl: 4 .  clonazePAM (KLONOPIN) 0.5 MG tablet,  TAKE 1 TABLET (0.5 MG TOTAL) BY MOUTH EVERY 8 HOURS AS NEEDED FOR ANXIETY., Disp: 90 tablet, Rfl: 3 .  Cyanocobalamin (VITAMIN B-12 PO), Take by mouth daily., Disp: , Rfl:  .  cyclobenzaprine (FLEXERIL) 5 MG tablet, Take 1 tablet (5 mg total) by mouth 3 (three) times daily as needed for muscle spasms., Disp: 30 tablet, Rfl: 1 .  fluticasone (FLONASE) 50 MCG/ACT nasal spray, SPRAY 2 SPRAYS INTO EACH NOSTRIL EVERY DAY, Disp: 48 mL, Rfl: 1 .  HYDROcodone-acetaminophen (NORCO) 10-325 MG tablet, Take 1 tablet by mouth every 6 (six) hours as needed., Disp: 60 tablet, Rfl: 0 .  meloxicam (MOBIC) 7.5 MG tablet, TAKE 1 TABLET BY MOUTH DAILY AS NEEDED FOR PAIN, Disp: 30 tablet, Rfl: 2 .  metoprolol succinate (TOPROL-XL) 50 MG 24 hr tablet, TAKE 1 TABLET (50 MG TOTAL) BY MOUTH DAILY. IN THE EVENING, Disp: 90 tablet, Rfl: 4 .  omeprazole (PRILOSEC) 20 MG capsule, Please specify directions, refills and quantity, Disp: 30 capsule, Rfl: 12 .  POTASSIUM PO, Take daily by mouth., Disp: , Rfl:  .  pravastatin (PRAVACHOL) 40 MG tablet, TAKE 1 TABLET BY MOUTH EVERY DAY, Disp: 90 tablet, Rfl: 4 .  sildenafil (REVATIO) 20 MG tablet, TAKE 1-2 TABLETS (20-40 MG TOTAL) BY MOUTH DAILY., Disp: 180 tablet, Rfl: 4 .  spironolactone (ALDACTONE) 25 MG tablet, TAKE 1 TABLET BY MOUTH EVERY DAY, Disp: 90 tablet, Rfl: 3 .  telmisartan-hydrochlorothiazide (MICARDIS  HCT) 80-25 MG tablet, TAKE 1 TABLET BY MOUTH EVERY DAY, Disp: 90 tablet, Rfl: 3  Review of Systems  Constitutional: Positive for diaphoresis and fatigue. Negative for appetite change, chills and fever.  HENT: Positive for congestion (nasal congestion), mouth sores (when blowing nose), postnasal drip and sneezing. Negative for sore throat.   Eyes: Negative for photophobia and visual disturbance.  Respiratory: Negative for cough, chest tightness, shortness of breath and wheezing.   Cardiovascular: Negative for chest pain and palpitations.  Gastrointestinal: Negative for  abdominal pain, nausea and vomiting.  Neurological: Positive for headaches.    Social History   Tobacco Use  . Smoking status: Current Every Day Smoker    Packs/day: 0.50    Years: 5.00    Pack years: 2.50    Types: Cigarettes  . Smokeless tobacco: Never Used  . Tobacco comment: quit for 30 years  Substance Use Topics  . Alcohol use: Yes    Alcohol/week: 0.0 standard drinks    Comment: drinks wine every night before bed      Objective:     Physical Exam  Awake, alert, oriented x 3. In no apparent distress       Assessment & Plan    1. Nonintractable headache, unspecified chronicity pattern, unspecified headache type Likely secondary to sinus infections, may be exacerbated by stress or anxiety.   - azithromycin (ZITHROMAX) 250 MG tablet; 2 by mouth today, then 1 daily for 4 days for sinusitus  Dispense: 6 tablet; Refill: 0  Call if not improved after finishing antibiotic.  2. Acute recurrent frontal sinusitis  - azithromycin (ZITHROMAX) 250 MG tablet; 2 by mouth today, then 1 daily for 4 days for sinusitus  Dispense: 6 tablet; Refill: 0  3. Essential (primary) hypertension Out of pocket cost of telmisartan-hctz is now too high for patient. Will change to - candesartan (ATACAND) 32 MG tablet; Take 1 tablet (32 mg total) by mouth daily. TAKE IN PLACE OF TELMISARTAN-HCTZ  Dispense: 90 tablet; Refill: 1 and - hydrochlorothiazide (HYDRODIURIL) 25 MG tablet; Take 1 tablet (25 mg total) by mouth daily.  Dispense: 90 tablet; Refill: 3   I discussed the assessment and treatment plan with the patient. The patient was provided an opportunity to ask questions and all were answered. The patient agreed with the plan and demonstrated an understanding of the instructions.   The patient was advised to call back or seek an in-person evaluation if the symptoms worsen or if the condition fails to improve as anticipated.  I provided 12 minutes of non-face-to-face time during this  encounter.     Lelon Huh, MD  [facility]

## 2019-02-05 ENCOUNTER — Telehealth: Payer: Self-pay | Admitting: Family Medicine

## 2019-02-05 DIAGNOSIS — G8929 Other chronic pain: Secondary | ICD-10-CM

## 2019-02-05 MED ORDER — HYDROCODONE-ACETAMINOPHEN 10-325 MG PO TABS: 1.0000 | ORAL_TABLET | Freq: Four times a day (QID) | ORAL | 0 refills | Status: DC | PRN

## 2019-02-05 NOTE — Telephone Encounter (Signed)
Medication Refill - Medication:  HYDROcodone-acetaminophen (NORCO) 10-325 MG tablet   Has the patient contacted their pharmacy?  Yes advised to call office.   Preferred Pharmacy (with phone number or street name):  [id number] [place], [place] Phone:  [phone]  Fax:  [phone]     Agent: Please be advised that RX refills may take up to 3 business days. We ask that you follow-up with your pharmacy.

## 2019-02-05 NOTE — Telephone Encounter (Signed)
Requested medication (s) are due for refill today: yes  Requested medication (s) are on the active medication list: yes  Last refill:  01/23/19  Future visit scheduled: yes  Notes to clinic:  not delegated    Requested Prescriptions  Pending Prescriptions Disp Refills   HYDROcodone-acetaminophen (NORCO) 10-325 MG tablet 60 tablet 0    Sig: Take 1 tablet by mouth every 6 (six) hours as needed.      Not Delegated - Analgesics:  Opioid Agonist Combinations Failed - 02/05/2019  8:46 AM      Failed - This refill cannot be delegated      Failed - Urine Drug Screen completed in last 360 days.      Failed - Valid encounter within last 6 months    Recent Outpatient Visits           3 days ago Nonintractable headache, unspecified chronicity pattern, unspecified headache type   [facility] Birdie Sons, MD   1 month ago Acute non-recurrent pansinusitis   [facility] [place], Clearnce [place], [place]   7 months ago Essential (primary) hypertension   [facility] Birdie Sons, MD   8 months ago Mass of left breast   [facility] Birdie Sons, MD   10 months ago Left maxillary sinusitis   [facility] [place], Vickki Muff, [place]

## 2019-02-19 ENCOUNTER — Other Ambulatory Visit: Payer: Self-pay | Admitting: Family Medicine

## 2019-02-19 DIAGNOSIS — G8929 Other chronic pain: Secondary | ICD-10-CM

## 2019-02-19 MED ORDER — HYDROCODONE-ACETAMINOPHEN 10-325 MG PO TABS: 1.0000 | ORAL_TABLET | Freq: Four times a day (QID) | ORAL | 0 refills | Status: DC | PRN

## 2019-02-19 NOTE — Telephone Encounter (Signed)
Requested medication (s) are due for refill today: yes  Requested medication (s) are on the active medication list:yes  Last refill:  02/05/19  Future visit scheduled: no  Notes to clinic:  not delegted    Requested Prescriptions  Pending Prescriptions Disp Refills   HYDROcodone-acetaminophen (NORCO) 10-325 MG tablet 60 tablet 0    Sig: Take 1 tablet by mouth every 6 (six) hours as needed.      Not Delegated - Analgesics:  Opioid Agonist Combinations Failed - 02/19/2019 10:46 AM      Failed - This refill cannot be delegated      Failed - Urine Drug Screen completed in last 360 days.      Failed - Valid encounter within last 6 months    Recent Outpatient Visits           2 weeks ago Nonintractable headache, unspecified chronicity pattern, unspecified headache type   [facility] Birdie Sons, MD   2 months ago Acute non-recurrent pansinusitis   [facility] Fenton Malling M, [place]   8 months ago Essential (primary) hypertension   [facility] Birdie Sons, MD   9 months ago Mass of left breast   [facility] Birdie Sons, MD   11 months ago Left maxillary sinusitis   [facility] [place], Vickki Muff, [place]

## 2019-02-19 NOTE — Telephone Encounter (Signed)
Copied from [place] [id number]. Topic: Quick Communication - Rx Refill/Question >> Feb 19, 2019 10:39 AM Rainey Pines A wrote: Medication: HYDROcodone-acetaminophen (NORCO) 10-325 MG tablet  Has the patient contacted their pharmacy? Yes (Agent: If no, request that the patient contact the pharmacy for the refill.) (Agent: If yes, when and what did the pharmacy advise?)Contact PCP  Preferred Pharmacy (with phone number [facility] street name[id number] [place], [place]  Phone:  [phone] Fax:  [phone]     Agent: Please be advised that RX refills may take up to 3 business days. We ask that you follow-up with your pharmacy.

## 2019-03-05 ENCOUNTER — Telehealth: Payer: Self-pay

## 2019-03-05 ENCOUNTER — Other Ambulatory Visit: Payer: Self-pay | Admitting: Family Medicine

## 2019-03-05 DIAGNOSIS — G8929 Other chronic pain: Secondary | ICD-10-CM

## 2019-03-05 MED ORDER — TELMISARTAN-HCTZ 80-12.5 MG PO TABS: 1.0000 | ORAL_TABLET | Freq: Every day | ORAL | 5 refills | Status: DC

## 2019-03-05 NOTE — Telephone Encounter (Signed)
Copied from [place] [id number]. Topic: General - Other >> Mar 05, 2019  3:23 PM Mcneil, Ja-Kwan wrote: Reason for CRM: Pt stated he would like to go back on the telmisartan-hydrochlorothiazide (MICARDIS HCT) 80-12.5 MG tablet. Pt stated he had asked it to be changed due to the price but at this point he prefers to pay the out of pocket cost. Pt [id number] [place], [place]

## 2019-03-05 NOTE — Addendum Note (Signed)
Addended by: Birdie Sons on: 03/05/2019 05:10 PM   Modules accepted: Orders

## 2019-03-05 NOTE — Telephone Encounter (Signed)
Medication:HYDROcodone-acetaminophen (NORCO) 10-325 MG tablet [id number]   Has the patient contacted their pharmacy? Yes  (Agent: If no, request that the patient contact the pharmacy for the refill.) (Agent: If yes, when and what did the pharmacy advise?)  Preferred Pharmacy (with phone number or street name[id number] [place]  Phone:  [phone] Fax:  [phone]     Agent: Please be advised that RX refills may take up to 3 business days. We ask that you follow-up with your pharmacy.

## 2019-03-05 NOTE — Addendum Note (Signed)
Addended by: Ashley Royalty E on: 03/05/2019 10:33 AM   Modules accepted: Orders

## 2019-03-06 MED ORDER — HYDROCODONE-ACETAMINOPHEN 10-325 MG PO TABS: 1.0000 | ORAL_TABLET | Freq: Four times a day (QID) | ORAL | 0 refills | Status: DC | PRN

## 2019-03-06 NOTE — Addendum Note (Signed)
Addended by: Birdie Sons on: 03/06/2019 07:53 AM   Modules accepted: Orders

## 2019-03-19 ENCOUNTER — Other Ambulatory Visit: Payer: Self-pay | Admitting: Family Medicine

## 2019-03-19 DIAGNOSIS — G8929 Other chronic pain: Secondary | ICD-10-CM

## 2019-03-19 DIAGNOSIS — M5441 Lumbago with sciatica, right side: Secondary | ICD-10-CM

## 2019-03-19 MED ORDER — HYDROCODONE-ACETAMINOPHEN 10-325 MG PO TABS: 1.0000 | ORAL_TABLET | Freq: Four times a day (QID) | ORAL | 0 refills | Status: DC | PRN

## 2019-03-19 NOTE — Telephone Encounter (Signed)
Requested medication (s) are due for refill today: yes  Requested medication (s) are on the active medication list: yes  Last refill:  03/06/19  Future visit scheduled: no  Notes to clinic:  Refill not delegated per protocol    Requested Prescriptions  Pending Prescriptions Disp Refills   HYDROcodone-acetaminophen (NORCO) 10-325 MG tablet 60 tablet 0    Sig: Take 1 tablet by mouth every 6 (six) hours as needed.      Not Delegated - Analgesics:  Opioid Agonist Combinations Failed - 03/19/2019  9:27 AM      Failed - This refill cannot be delegated      Failed - Urine Drug Screen completed in last 360 days.      Failed - Valid encounter within last 6 months    Recent Outpatient Visits           1 month ago Nonintractable headache, unspecified chronicity pattern, unspecified headache type   [facility] Birdie Sons, MD   3 months ago Acute non-recurrent pansinusitis   [facility] Fenton Malling M, [place]   9 months ago Essential (primary) hypertension   [facility] Birdie Sons, MD   10 months ago Mass of left breast   [facility] Birdie Sons, MD   1 year ago Left maxillary sinusitis   [facility] [place], Vickki Muff, [place]

## 2019-03-19 NOTE — Telephone Encounter (Signed)
Medication Refill - Medication: HYDROcodone-acetaminophen (NORCO) 10-325 MG tablet  Has the patient contacted their pharmacy? no (Agent: If no, request that the patient contact the pharmacy for the refill.) (Agent: If yes, when and what did the pharmacy advise?)  Preferred Pharmacy (with phone number or street name):  [id number] [place] Phone:  [phone]  Fax:  [phone]     Agent: Please be advised that RX refills may take up to 3 business days. We ask that you follow-up with your pharmacy.

## 2019-03-26 ENCOUNTER — Other Ambulatory Visit: Payer: Self-pay | Admitting: Family Medicine

## 2019-03-26 NOTE — Telephone Encounter (Signed)
Pharmacy is requesting alternative medication due to pt's cost of copay. Also needs Sig information. Routing to [facility].

## 2019-03-29 MED ORDER — VALSARTAN-HYDROCHLOROTHIAZIDE 160-25 MG PO TABS: 1.0000 | ORAL_TABLET | Freq: Every day | ORAL | 1 refills | Status: DC

## 2019-03-29 NOTE — Telephone Encounter (Signed)
Changed to valsartan-hctz

## 2019-04-02 ENCOUNTER — Other Ambulatory Visit: Payer: Self-pay | Admitting: Family Medicine

## 2019-04-02 DIAGNOSIS — M5441 Lumbago with sciatica, right side: Secondary | ICD-10-CM

## 2019-04-02 DIAGNOSIS — G8929 Other chronic pain: Secondary | ICD-10-CM

## 2019-04-02 NOTE — Telephone Encounter (Signed)
Medication Refill - Medication: hydrocodone  Has the patient contacted their pharmacy? Yes.   (Agent: If no, request that the patient contact the pharmacy for the refill.) (Agent: If yes, when and what did the pharmacy advise?)  Preferred Pharmacy (with phone number or street name):  [id number] [place], [place]  [place] [address]  Phone: [phone] Fax: [phone]  Not a 24 hour pharmacy; exact hours not known.     Agent: Please be advised that RX refills may take up to 3 business days. We ask that you follow-up with your pharmacy.

## 2019-04-02 NOTE — Telephone Encounter (Signed)
Requested medication (s) are due for refill today: yes  Requested medication (s) are on the active medication list: yes  Last refill: 03/19/19  Future visit scheduled: no  Notes to clinic: not delegated; no valid encounter within the last 6 months    Requested Prescriptions  Pending Prescriptions Disp Refills   HYDROcodone-acetaminophen (NORCO) 10-325 MG tablet 60 tablet 0    Sig: Take 1 tablet by mouth every 6 (six) hours as needed.      Not Delegated - Analgesics:  Opioid Agonist Combinations Failed - 04/02/2019  9:14 AM      Failed - This refill cannot be delegated      Failed - Urine Drug Screen completed in last 360 days.      Failed - Valid encounter within last 6 months    Recent Outpatient Visits           1 month ago Nonintractable headache, unspecified chronicity pattern, unspecified headache type   [facility] Birdie Sons, MD   3 months ago Acute non-recurrent pansinusitis   [facility] Fenton Malling M, [place]   9 months ago Essential (primary) hypertension   [facility] Birdie Sons, MD   10 months ago Mass of left breast   [facility] Birdie Sons, MD   1 year ago Left maxillary sinusitis   [facility] [place]ism[place], Vickki Muff, [place]

## 2019-04-04 MED ORDER — HYDROCODONE-ACETAMINOPHEN 10-325 MG PO TABS: 1.0000 | ORAL_TABLET | Freq: Four times a day (QID) | ORAL | 0 refills | Status: DC | PRN

## 2019-04-17 ENCOUNTER — Other Ambulatory Visit: Payer: Self-pay | Admitting: Family Medicine

## 2019-04-17 DIAGNOSIS — G8929 Other chronic pain: Secondary | ICD-10-CM

## 2019-04-17 NOTE — Telephone Encounter (Signed)
Requested medication (s) are due for refill today: yes  Requested medication (s) are on the active medication list: yes  Last refill: 04/04/19  Future visit scheduled: no  Notes to clinic:  not delegated    Requested Prescriptions  Pending Prescriptions Disp Refills   HYDROcodone-acetaminophen (NORCO) 10-325 MG tablet 60 tablet 0    Sig: Take 1 tablet by mouth every 6 (six) hours as needed.      Not Delegated - Analgesics:  Opioid Agonist Combinations Failed - 04/17/2019  8:21 AM      Failed - This refill cannot be delegated      Failed - Urine Drug Screen completed in last 360 days.      Failed - Valid encounter within last 6 months    Recent Outpatient Visits           2 months ago Nonintractable headache, unspecified chronicity pattern, unspecified headache type   [facility] Birdie Sons, MD   4 months ago Acute non-recurrent pansinusitis   [facility] Fenton Malling M, [place]   10 months ago Essential (primary) hypertension   [facility] Birdie Sons, MD   11 months ago Mass of left breast   [facility] Birdie Sons, MD   1 year ago Left maxillary sinusitis   [facility] Chrism[place], Vickki Muff, [place]

## 2019-04-17 NOTE — Telephone Encounter (Signed)
Patient requesting HYDROcodone-acetaminophen ([place]) 10-325 MG tablet, informed please allow 48 to 72 hour turn around time  [id number] [place] Phone:  [phone]  Fax:  [phone]

## 2019-04-18 MED ORDER — HYDROCODONE-ACETAMINOPHEN 10-325 MG PO TABS: 1.0000 | ORAL_TABLET | Freq: Four times a day (QID) | ORAL | 0 refills | Status: DC | PRN

## 2019-04-19 ENCOUNTER — Other Ambulatory Visit: Payer: Self-pay | Admitting: Family Medicine

## 2019-04-27 ENCOUNTER — Other Ambulatory Visit: Payer: Self-pay | Admitting: Family Medicine

## 2019-04-27 DIAGNOSIS — I10 Essential (primary) hypertension: Secondary | ICD-10-CM

## 2019-04-30 ENCOUNTER — Other Ambulatory Visit: Payer: Self-pay | Admitting: Family Medicine

## 2019-04-30 DIAGNOSIS — G8929 Other chronic pain: Secondary | ICD-10-CM

## 2019-04-30 DIAGNOSIS — M5441 Lumbago with sciatica, right side: Secondary | ICD-10-CM

## 2019-04-30 NOTE — Telephone Encounter (Signed)
Requested medication (s) are due for refill today: no  Requested medication (s) are on the active medication list: yes   Last refill: 04/18/2019  #60  0 refills  Future visit scheduled yes  Notes to clinic not delegated Attempted to reach pt to clarify request, should have 12 tabs remaining. Left VM to CB.  Requested Prescriptions  Pending Prescriptions Disp Refills   HYDROcodone-acetaminophen (NORCO) 10-325 MG tablet 60 tablet 0    Sig: Take 1 tablet by mouth every 6 (six) hours as needed.      Not Delegated - Analgesics:  Opioid Agonist Combinations Failed - 04/30/2019  4:29 PM      Failed - This refill cannot be delegated      Failed - Urine Drug Screen completed in last 360 days.      Failed - Valid encounter within last 6 months    Recent Outpatient Visits           2 months ago Nonintractable headache, unspecified chronicity pattern, unspecified headache type   [facility] Birdie Sons, MD   4 months ago Acute non-recurrent pansinusitis   [facility] Fenton Malling M, [place]   10 months ago Essential (primary) hypertension   [facility] Birdie Sons, MD   11 months ago Mass of left breast   [facility] Birdie Sons, MD   1 year ago Left maxillary sinusitis   [facility] Chrismon, Vickki Muff, [place]

## 2019-04-30 NOTE — Telephone Encounter (Signed)
Medication Refill - Medication:  HYDROcodone-acetaminophen (NORCO) 10-325 MG tablet  Has the patient contacted their pharmacy? Yes.   (Agent: If no, request that the patient contact the pharmacy for the refill.) (Agent: If yes, when and what did the pharmacy advise?)  Preferred Pharmacy (with phone number [facility] street name):  [id number] [place], [place]  [place] [address]  Phone: [phone] Fax: [phone]     Agent: Please be advised that RX refills may take up to 3 business days. We ask that you follow-up with your pharmacy.

## 2019-05-01 MED ORDER — HYDROCODONE-ACETAMINOPHEN 10-325 MG PO TABS: 1.0000 | ORAL_TABLET | Freq: Four times a day (QID) | ORAL | 0 refills | Status: DC | PRN

## 2019-05-09 NOTE — Progress Notes (Signed)
Pt rescheduled apt to 05/15/19. This encounter was created in error - please disregard.

## 2019-05-14 ENCOUNTER — Other Ambulatory Visit: Payer: Self-pay | Admitting: Family Medicine

## 2019-05-14 ENCOUNTER — Other Ambulatory Visit: Payer: Self-pay

## 2019-05-14 ENCOUNTER — Telehealth: Payer: Self-pay

## 2019-05-14 DIAGNOSIS — M5441 Lumbago with sciatica, right side: Secondary | ICD-10-CM

## 2019-05-14 DIAGNOSIS — G8929 Other chronic pain: Secondary | ICD-10-CM

## 2019-05-14 NOTE — Telephone Encounter (Signed)
Medication Refill - Medication:  HYDROcodone-acetaminophen (NORCO) 10-325 MG tablet    Has the patient contacted their pharmacy? Yes.   (Agent: If no, request that the patient contact the pharmacy for the refill.) (Agent: If yes, when and what did the pharmacy advise?)  Preferred Pharmacy (with phone number [facility] street name[id number] [place], [place] Phone:  [phone]  Fax:  [phone]       Agent: Please be advised that RX refills may take up to 3 business days. We ask that you follow-up with your pharmacy.

## 2019-05-14 NOTE — Telephone Encounter (Signed)
Requested medication (s) are due for refill today: no  Requested medication (s) are on the active medication list: yes  Last refill:  05/01/2019  Future visit scheduled: No  Notes to clinic:  this refill cannot be delegated    Requested Prescriptions  Pending Prescriptions Disp Refills   HYDROcodone-acetaminophen (NORCO) 10-325 MG tablet 60 tablet 0    Sig: Take 1 tablet by mouth every 6 (six) hours as needed.      Not Delegated - Analgesics:  Opioid Agonist Combinations Failed - 05/14/2019  8:32 AM      Failed - This refill cannot be delegated      Failed - Urine Drug Screen completed in last 360 days.      Failed - Valid encounter within last 6 months    Recent Outpatient Visits           3 months ago Nonintractable headache, unspecified chronicity pattern, unspecified headache type   [facility] Birdie Sons, MD   5 months ago Acute non-recurrent pansinusitis   [facility] [place] M[place] M, [place]   10 months ago Essential (primary) hypertension   [facility] Birdie Sons, MD   12 months ago Mass of left breast   [facility] Birdie Sons, MD   1 year ago Left maxillary sinusitis   [facility] [place], Vickki Muff, [place]

## 2019-05-14 NOTE — Telephone Encounter (Signed)
Patient has had mild swelling in his ankles and feet for a few days. He wants to have labs done. I advised patient that he needs an office visit before we could order labs. Appointment scheduled for Friday 05/18/2019 at [place] with [place].

## 2019-05-14 NOTE — Progress Notes (Signed)
Subjective:   Gary Carter is a 69 y.o. male who presents for an Initial Medicare Annual Wellness Visit.    This visit is being conducted through telemedicine due to the COVID-19 pandemic. This patient has given me verbal consent via doximity to conduct this visit, patient states they are participating from their home address. Some vital signs may be absent or patient reported.    Patient identification: identified by name, DOB, and current address  Review of Systems  N/A       Objective:    There were no vitals filed for this visit. There is no height or weight on file to calculate BMI. Unable to obtain vitals due to visit being conducted via telephonically.   Advanced Directives 07/10/2018  Does Patient Have a Medical Advance Directive? No  Would patient like information on creating a medical advance directive? No - Patient declined    Current Medications (verified) Outpatient Encounter Medications as of 05/14/2019  Medication Sig  . amLODipine (NORVASC) 10 MG tablet TAKE 1 TABLET BY MOUTH EVERY DAY  . aspirin 325 MG tablet Take 325 mg by mouth once.   . Cholecalciferol (VITAMIN D3 PO) Take daily by mouth.  . citalopram (CELEXA) 40 MG tablet TAKE 1 TABLET (40 MG TOTAL) DAILY BY MOUTH.  . clonazePAM (KLONOPIN) 0.5 MG tablet TAKE 1 TABLET (0.5 MG TOTAL) BY MOUTH EVERY 8 HOURS AS NEEDED FOR ANXIETY.  . Cyanocobalamin (VITAMIN B-12 PO) Take by mouth daily.  . cyclobenzaprine (FLEXERIL) 5 MG tablet Take 1 tablet (5 mg total) by mouth 3 (three) times daily as needed for muscle spasms.  . fluticasone (FLONASE) 50 MCG/ACT nasal spray SPRAY 2 SPRAYS INTO EACH NOSTRIL EVERY DAY  . HYDROcodone-acetaminophen (NORCO) 10-325 MG tablet Take 1 tablet by mouth every 6 (six) hours as needed.  . meloxicam (MOBIC) 7.5 MG tablet TAKE 1 TABLET BY MOUTH DAILY AS NEEDED FOR PAIN  . metoprolol succinate (TOPROL-XL) 50 MG 24 hr tablet TAKE 1 TABLET (50 MG TOTAL) BY MOUTH DAILY. IN THE EVENING  .  omeprazole (PRILOSEC) 20 MG capsule Please specify directions, refills and quantity  . POTASSIUM PO Take daily by mouth.  . pravastatin (PRAVACHOL) 40 MG tablet TAKE 1 TABLET BY MOUTH EVERY DAY  . sildenafil (REVATIO) 20 MG tablet TAKE 1-2 TABLETS (20-40 MG TOTAL) BY MOUTH DAILY.  Marland Kitchen spironolactone (ALDACTONE) 25 MG tablet TAKE 1 TABLET BY MOUTH EVERY DAY  . valsartan-hydrochlorothiazide (DIOVAN HCT) 160-25 MG tablet Take 1 tablet by mouth daily. Take in place of atacand-hctz   No facility-administered encounter medications on file as of 05/14/2019.    Allergies (verified) Hctz [hydrochlorothiazide]   History: Past Medical History:  Diagnosis Date  . Anxiety   . Barrett's esophagus   . GERD (gastroesophageal reflux disease)   . History of chicken pox 06/12/2014   DID have Chicken Pox. DID have Measles. DID have Mumps. DID have Rubella.    . History of dysphagia   . History of fatty infiltration of liver   . History of measles   . History of mumps   . History of rubella   . Hypertension   . Sleep apnea   . SOB (shortness of breath)    Past Surgical History:  Procedure Laterality Date  . ABDOMINAL SURGERY    . BACK SURGERY  12-04-2003   Lumbar fusion & C5-C6 fusion  . ESOPHAGOGASTRODUODENOSCOPY (EGD) WITH PROPOFOL N/A 07/10/2018   Procedure: ESOPHAGOGASTRODUODENOSCOPY (EGD) WITH PROPOFOL;  Surgeon: Manya Silvas,  MD;  Location: [facility];  Service: Endoscopy;  Laterality: N/A;  . HERNIA REPAIR    . Myocardial perfusion scan  10/10/2006  . NECK SURGERY    . NERVE, TENDON AND ARTERY REPAIR Left 10/18/2012   Procedure: NERVE, TENDON AND ARTERY REPAIR;  Surgeon: Linna Hoff, MD;  Location: [facility] [place];  Service: Orthopedics;  Laterality: Left;  . NISSEN FUNDOPLICATION  [id number]  . UPPER GI ENDOSCOPY  04/09/2007   Dr. Tiffany Kocher; Mucosal changes suspicious for Barretts esophagits  . WOUND EXPLORATION Left 10/18/2012   Procedure: WOUND EXPLORATION;  Surgeon: Linna Hoff, MD;  Location:  [place];  Service: Orthopedics;  Laterality: Left;   Family History  Problem Relation Age of Onset  . Hypertension Sister   . Bipolar disorder Sister   . Emphysema Mother   . Diabetes Daughter 3  . Bipolar disorder Brother   . Hypertension Brother   . Obesity Brother    Social History   Socioeconomic History  . Marital status: Divorced    Spouse name: Not on file  . Number of children: 1  . Years of education: Not on file  . Highest education level: Not on file  Occupational History  . Occupation: Works at [employer]  . Smoking status: Current Every Day Smoker    Packs/day: 0.50    Years: 5.00    Pack years: 2.50    Types: Cigarettes  . Smokeless tobacco: Never Used  . Tobacco comment: quit for 30 years  Substance and Sexual Activity  . Alcohol use: Yes    Alcohol/week: 0.0 standard drinks    Comment: drinks wine every night before bed  . Drug use: No    Comment: previously smoked marijuana years ago  . Sexual activity: Not on file  Other Topics Concern  . Not on file  Social History Narrative  . Not on file   Social Determinants of Health   Financial Resource Strain:   . Difficulty of Paying Living Expenses:   Food Insecurity:   . Worried About Charity fundraiser in the Last Year:   . Arboriculturist in the Last Year:   Transportation Needs:   . Film/video editor (Medical):   Marland Kitchen Lack of Transportation (Non-Medical):   Physical Activity:   . Days of Exercise per Week:   . Minutes of Exercise per Session:   Stress:   . Feeling of Stress :   Social Connections:   . Frequency of Communication with Friends and Family:   . Frequency of Social Gatherings with Friends and Family:   . Attends Religious Services:   . Active Member of Clubs or Organizations:   . Attends Archivist Meetings:   Marland Kitchen Marital Status:    Tobacco Counseling Ready to quit: Not Answered Counseling given: Not Answered Comment: quit for 30 years   Clinical Intake:                        Activities of Daily Living No flowsheet data found.   Immunizations and Health Maintenance Immunization History  Administered Date(s) Administered  . Influenza Split 10/08/2011  . Influenza, High Dose Seasonal PF 09/16/2017  . Influenza,inj,Quad PF,6+ Mos 10/09/2013, 09/14/2016  . Influenza-Unspecified 10/29/2014, 09/16/2017, 10/01/2018  . Pneumococcal Conjugate-13 11/15/2017  . Pneumococcal Polysaccharide-23 10/08/2011  . Tdap 10/08/2011  . Zoster 10/09/2013  . Zoster Recombinat (Shingrix) 02/07/2017, 04/21/2017   Health Maintenance Due  Topic Date Due  .  COVID-19 Vaccine (1) Never done  . PNA vac Low Risk Adult (2 of 2 - PPSV23) 11/16/2018    Patient Care Team: Birdie Sons, MD as PCP - General (Family Medicine)  Indicate any recent Medical Services you may have received from other than [facility] providers in the past year (date may be approximate).    Assessment:   This is a routine wellness examination for [place].  Hearing/Vision screen No exam data present  Dietary issues and exercise activities discussed:    Goals   None    Depression Screen PHQ 2/9 Scores 02/14/2018 02/07/2017 03/08/2016  PHQ - 2 Score 0 0 5  PHQ- 9 Score 1 5 18     Fall Risk Fall Risk  02/14/2018 07/26/2017  Falls in the past year? 0 No  Number falls in past yr: 0 -  Injury with Fall? 0 -  Follow up Falls evaluation completed -    FALL RISK PREVENTION PERTAINING TO THE HOME:  Any stairs in or around the home? Yes  If so, are there any without handrails? No   Home free of loose throw rugs in walkways, pet beds, electrical cords, etc? Yes  Adequate lighting in your home to reduce risk of falls? Yes   ASSISTIVE DEVICES UTILIZED TO PREVENT FALLS:  Life alert? No  Use of a cane, walker or w/c? No  Grab bars in the bathroom? No  Shower chair or bench in shower? No  Elevated toilet seat or a handicapped toilet? No    TIMED UP AND GO:  Was the test  performed? No .    Cognitive Function: Declined today.         Screening Tests Health Maintenance  Topic Date Due  . COVID-19 Vaccine (1) Never done  . PNA vac Low Risk Adult (2 of 2 - PPSV23) 11/16/2018  . INFLUENZA VACCINE  08/12/2019  . COLONOSCOPY  06/17/2021  . TETANUS/TDAP  10/07/2021  . Hepatitis C Screening  Completed    Qualifies for Shingles Vaccine? Completed series  Tdap: Up to date  Flu Vaccine: Up to date  Pneumococcal Vaccine: Due for Pneumococcal vaccine. Does the patient want to receive this vaccine today?  No . Advised may receive this vaccine at local pharmacy or Health Dept. Aware to provide a copy of the vaccination record if obtained from local pharmacy or Health Dept. Verbalized acceptance and understanding.   Cancer Screenings:  Colorectal Screening: Completed 06/18/11. Repeat every 10 years.   Lung Cancer Screening: (Low Dose CT Chest recommended if Age 36-80 years, 30 pack-year currently smoking OR have quit w/in 15years.) does qualify. An Epic message has been sent to Burgess Estelle, RN (Oncology Nurse Navigator) regarding the possible need for this exam. Raquel Sarna will review the patient's chart to determine if the patient truly qualifies for the exam. If the patient qualifies, Raquel Sarna will order the Low Dose CT of the chest to facilitate the scheduling of this exam.  Additional Screening:  Hepatitis C Screening: Up to date  Vision Screening: Recommended annual ophthalmology exams for early detection of glaucoma and other disorders of the eye.  Dental Screening: Recommended annual dental exams for proper oral hygiene  Community Resource Referral:  CRR required this visit?  No        Plan:  I have personally reviewed and addressed the Medicare Annual Wellness questionnaire and have noted the following in the patient's chart:  A. Medical and social history B. Use of alcohol, tobacco or illicit drugs  C.  Current medications and  supplements D. Functional ability and status E.  Nutritional status F.  Physical activity G. Advance directives H. List of other physicians I.  Hospitalizations, surgeries, and ER visits in previous 12 months J.  [place] such as hearing and vision if needed, cognitive and depression L. Referrals and appointments   In addition, I have reviewed and discussed with patient certain preventive protocols, quality metrics, and best practice recommendations. A written personalized care plan for preventive services as well as general preventive health recommendations were provided to patient.   [place] Score, [place]   [phone]  Nurse Health Advisor   Nurse Notes: Pt would like to receive the Pneumovax 23 vaccine at next in office apt (05/18/19).

## 2019-05-14 NOTE — Telephone Encounter (Signed)
Copied from [place] [phone]. Topic: General - Inquiry >> May 14, 2019  8:24 AM Virl Axe D wrote: Reason for CRM: Pt is requesting to have labs done. Stated he is starting a new job and it will be hard to find time to come in the office for a visit. Pt would like a callback. Please advise.

## 2019-05-15 ENCOUNTER — Ambulatory Visit (INDEPENDENT_AMBULATORY_CARE_PROVIDER_SITE_OTHER): Payer: Medicare Other

## 2019-05-15 ENCOUNTER — Other Ambulatory Visit: Payer: Self-pay

## 2019-05-15 DIAGNOSIS — Z0000 Encounter for general adult medical examination without abnormal findings: Secondary | ICD-10-CM | POA: Diagnosis not present

## 2019-05-15 NOTE — Patient Instructions (Signed)
Gary Carter , Thank you for taking time to come for your Medicare Wellness Visit. I appreciate your ongoing commitment to your health goals. Please review the following plan we discussed and let me know if I can assist you in the future.   Screening recommendations/referrals: Colonoscopy: Up to date, due 06/2021 Recommended yearly ophthalmology/optometry visit for glaucoma screening and checkup Recommended yearly dental visit for hygiene and checkup  Vaccinations: Influenza vaccine: Up to date Pneumococcal vaccine: Pneumovax 23 due. Tdap vaccine: Up to date  Shingles vaccine: Completed series  Advanced directives: Please bring a copy of your POA (Power of [place]) and/or Living Will to your next appointment.   Conditions/risks identified: Smoking cessation discussed today.   Next appointment: 05/18/19 @ 9:00 AM with Carles Collet. Declined scheduling an AWV for 2022 at this time.   Preventive Care 67 Years and Older, Male Preventive care refers to lifestyle choices and visits with your health care provider that can promote health and wellness. What does preventive care include?  A yearly physical exam. This is also called an annual well check.  Dental exams once or twice a year.  Routine eye exams. Ask your health care provider how often you should have your eyes checked.  Personal lifestyle choices, including:  Daily care of your teeth and gums.  Regular physical activity.  Eating a healthy diet.  Avoiding tobacco and drug use.  Limiting alcohol use.  Practicing safe sex.  Taking low doses of aspirin every day.  Taking vitamin and mineral supplements as recommended by your health care provider. What happens during an annual well check? The services and screenings done by your health care provider during your annual well check will depend on your age, overall health, lifestyle risk factors, and family history of disease. Counseling  Your health care provider may ask  you questions about your:  Alcohol use.  Tobacco use.  Drug use.  Emotional well-being.  Home and relationship well-being.  Sexual activity.  Eating habits.  History of falls.  Memory and ability to understand (cognition).  Work and work Statistician. Screening  You may have the following tests or measurements:  Height, weight, and BMI.  Blood pressure.  Lipid and cholesterol levels. These may be checked every 5 years, or more frequently if you are over 16 years old.  Skin check.  Lung cancer screening. You may have this screening every year starting at age 1 if you have a 30-pack-year history of smoking and currently smoke or have quit within the past 15 years.  Fecal occult blood test (FOBT) of the stool. You may have this test every year starting at age 54.  Flexible sigmoidoscopy or colonoscopy. You may have a sigmoidoscopy every 5 years or a colonoscopy every 10 years starting at age 68.  Prostate cancer screening. Recommendations will vary depending on your family history and other risks.  Hepatitis C blood test.  Hepatitis B blood test.  Sexually transmitted disease (STD) testing.  Diabetes screening. This is done by checking your blood sugar (glucose) after you have not eaten for a while (fasting). You may have this done every 1-3 years.  Abdominal aortic aneurysm (AAA) screening. You may need this if you are a current or former smoker.  Osteoporosis. You may be screened starting at age 41 if you are at high risk. Talk with your health care provider about your test results, treatment options, and if necessary, the need for more tests. Vaccines  Your health care provider may recommend certain  vaccines, such as:  Influenza vaccine. This is recommended every year.  Tetanus, diphtheria, and acellular pertussis (Tdap, Td) vaccine. You may need a Td booster every 10 years.  Zoster vaccine. You may need this after age 3.  Pneumococcal 13-valent conjugate  (PCV13) vaccine. One dose is recommended after age 76.  Pneumococcal polysaccharide (PPSV23) vaccine. One dose is recommended after age 48. Talk to your health care provider about which screenings and vaccines you need and how often you need them. This information is not intended to replace advice given to you by your health care provider. Make sure you discuss any questions you have with your health care provider. Document Released: 01/24/2015 Document Revised: 09/17/2015 Document Reviewed: 10/29/2014 Elsevier Interactive Patient Education  2017 [place] Prevention in the Home Falls can cause injuries. They can happen to people of all ages. There are many things you can do to make your home safe and to help prevent falls. What can I do on the outside of my home?  Regularly fix the edges of walkways and driveways and fix any cracks.  Remove anything that might make you trip as you walk through a door, such as a raised step or threshold.  Trim any bushes or trees on the path to your home.  Use bright outdoor lighting.  Clear any walking paths of anything that might make someone trip, such as rocks or tools.  Regularly check to see if handrails are loose or broken. Make sure that both sides of any steps have handrails.  Any raised decks and porches should have guardrails on the edges.  Have any leaves, snow, or ice cleared regularly.  Use sand or salt on walking paths during winter.  Clean up any spills in your garage right away. This includes oil or grease spills. What can I do in the bathroom?  Use night lights.  Install grab bars by the toilet and in the tub and shower. Do not use towel bars as grab bars.  Use non-skid mats or decals in the tub or shower.  If you need to sit down in the shower, use a plastic, non-slip stool.  Keep the floor dry. Clean up any water that spills on the floor as soon as it happens.  Remove soap buildup in the tub or shower  regularly.  Attach bath mats securely with double-sided non-slip rug tape.  Do not have throw rugs and other things on the floor that can make you trip. What can I do in the bedroom?  Use night lights.  Make sure that you have a light by your bed that is easy to reach.  Do not use any sheets or blankets that are too big for your bed. They should not hang down onto the floor.  Have a firm chair that has side arms. You can use this for support while you get dressed.  Do not have throw rugs and other things on the floor that can make you trip. What can I do in the kitchen?  Clean up any spills right away.  Avoid walking on wet floors.  Keep items that you use a lot in easy-to-reach places.  If you need to reach something above you, use a strong step stool that has a grab bar.  Keep electrical cords out of the way.  Do not use floor polish or wax that makes floors slippery. If you must use wax, use non-skid floor wax.  Do not have throw rugs and other things  on the floor that can make you trip. What can I do with my stairs?  Do not leave any items on the stairs.  Make sure that there are handrails on both sides of the stairs and use them. Fix handrails that are broken or loose. Make sure that handrails are as long as the stairways.  Check any carpeting to make sure that it is firmly attached to the stairs. Fix any carpet that is loose or worn.  Avoid having throw rugs at the top or bottom of the stairs. If you do have throw rugs, attach them to the floor with carpet tape.  Make sure that you have a light switch at the top of the stairs and the bottom of the stairs. If you do not have them, ask someone to add them for you. What else can I do to help prevent falls?  Wear shoes that:  Do not have high heels.  Have rubber bottoms.  Are comfortable and fit you well.  Are closed at the toe. Do not wear sandals.  If you use a stepladder:  Make sure that it is fully  opened. Do not climb a closed stepladder.  Make sure that both sides of the stepladder are locked into place.  Ask someone to hold it for you, if possible.  Clearly mark and make sure that you can see:  Any grab bars or handrails.  First and last steps.  Where the edge of each step is.  Use tools that help you move around (mobility aids) if they are needed. These include:  Canes.  Walkers.  Scooters.  Crutches.  Turn on the lights when you go into a dark area. Replace any light bulbs as soon as they burn out.  Set up your furniture so you have a clear path. Avoid moving your furniture around.  If any of your floors are uneven, fix them.  If there are any pets around you, be aware of where they are.  Review your medicines with your doctor. Some medicines can make you feel dizzy. This can increase your chance of falling. Ask your doctor what other things that you can do to help prevent falls. This information is not intended to replace advice given to you by your health care provider. Make sure you discuss any questions you have with your health care provider. Document Released: 10/24/2008 Document Revised: 06/05/2015 Document Reviewed: 02/01/2014 Elsevier Interactive Patient Education  2017 [employer].

## 2019-05-15 NOTE — Telephone Encounter (Signed)
Has had 30 days worth dispensed since 04-18-2019

## 2019-05-16 ENCOUNTER — Telehealth: Payer: Self-pay | Admitting: Family Medicine

## 2019-05-16 MED ORDER — HYDROCODONE-ACETAMINOPHEN 10-325 MG PO TABS: 1.0000 | ORAL_TABLET | Freq: Four times a day (QID) | ORAL | 0 refills | Status: DC | PRN

## 2019-05-16 NOTE — Telephone Encounter (Signed)
Request for refill of medication listed as pended for review- non delegated Rx

## 2019-05-16 NOTE — Telephone Encounter (Signed)
Copied from [place] [id number]. Topic: Quick Communication - Rx Refill/Question >> May 16, 2019 11:28 AM Yvette Rack wrote: Medication: HYDROcodone-acetaminophen (NORCO) 10-325 MG tablet  Has the patient contacted their pharmacy? no  Preferred Pharm[facility] (with phone [id number] [place] [place]  Phone: [phone]   Fax: [phone]  Agent: Please be advised that RX refills may take up to 3 business days. We ask that you follow-up with your pharmacy.

## 2019-05-17 NOTE — Progress Notes (Signed)
I,Laura E Walsh,acting as a Education administrator for [employer], PA-C.,have documented all relevant documentation on the behalf of Trinna Post, PA-C,as directed by  Trinna Post, PA-C while in the presence of Trinna Post, PA-C.  Established patient visit   Patient: Gary Carter   DOB: 1950/03/10   69 y.o. Male  MRN: [id number] Visit Date: 05/18/2019  Today's healthcare provider: Trinna Post, PA-C   Chief Complaint  Patient presents with  . Edema    Left foot worse than right.   Subjective    HPI   Edema: Patient complains of edema. The location of the edema is lower leg(s) bilateral.  The edema has been moderate.  Onset of symptoms was 3 months ago, unchanged since that time. The edema is present in the evening. The swelling has been aggravated by standing too long on a concrete floor, relieved by nothing, and been associated with nothing. Cardiac risk factors include advanced age (older than 63 for men, 39 for women), hypertension and male gender. Patient is currently taking amlodipine 10 mg QD and Diovan HCT 160-25 mg QD. Denies chest pain, SOB.     Patient Active Problem List   Diagnosis Date Noted  . Gastritis 07/20/2018  . Chronic, continuous use of opioids 11/15/2017  . History of kidney stones 05/04/2016  . Bunion 06/12/2014  . Insomnia 06/12/2014  . Panic attack 06/12/2014  . Obstructive apnea 06/12/2014  . Anxiety disorder 06/11/2014  . H/O suicide attempt 05/28/2010  . Esophagitis, reflux 05/15/2008  . Actinic keratoses 05/06/2008  . 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 Allergic rhinitis 03/08/2007  . Arthralgia of hand 12/14/2006  . HLD (hyperlipidemia) 08/15/2006  . Family history of coronary arteriosclerosis 08/13/2006  . 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 Chronic low back pain 12/07/2005  . Depression, recurrent ([place]) 12/05/2005  . Barrett esophagus 02/06/1997  . Essential (primary) hypertension 02/06/1997  .  Current smoker 01/11/1958   Past Medical History:  Diagnosis Date  . Anxiety   . Barrett's esophagus   . GERD (gastroesophageal reflux disease)   . History of chicken pox 06/12/2014   DID have Chicken Pox. DID have Measles. DID have Mumps. DID have Rubella.    . History of dysphagia   . History of fatty infiltration of liver   . History of measles   . History of mumps   . History of rubella   . Hyperlipidemia   . Hypertension   . Sleep apnea   . SOB (shortness of breath)    Allergies  Allergen Reactions  . Hctz [Hydrochlorothiazide]     Leg cramps     Medications: Outpatient Medications Prior to Visit  Medication Sig  . amLODipine (NORVASC) 10 MG tablet TAKE 1 TABLET BY MOUTH EVERY DAY  . aspirin 325 MG tablet Take 325 mg by mouth daily.   . Cholecalciferol (VITAMIN D3 PO) Take daily by mouth.  . citalopram (CELEXA) 40 MG tablet TAKE 1 TABLET (40 MG TOTAL) DAILY BY MOUTH.  . clonazePAM (KLONOPIN) 0.5 MG tablet TAKE 1 TABLET (0.5 MG TOTAL) BY MOUTH EVERY 8 HOURS AS NEEDED FOR ANXIETY.  . Cyanocobalamin (VITAMIN B-12 PO) Take by mouth daily.  . cyclobenzaprine (FLEXERIL) 5 MG tablet Take 1 tablet (5 mg total) by mouth 3 (three) times daily as needed for muscle spasms.  . fluticasone (FLONASE) 50 MCG/ACT nasal spray SPRAY 2 SPRAYS INTO EACH NOSTRIL EVERY DAY (Patient taking differently: As needed)  . HYDROcodone-acetaminophen (  NORCO) 10-325 MG tablet Take 1 tablet by mouth every 6 (six) hours as needed.  . meloxicam (MOBIC) 7.5 MG tablet TAKE 1 TABLET BY MOUTH DAILY AS NEEDED FOR PAIN  . metoprolol succinate (TOPROL-XL) 50 MG 24 hr tablet TAKE 1 TABLET (50 MG TOTAL) BY MOUTH DAILY. IN THE EVENING  . omeprazole (PRILOSEC) 20 MG capsule Please specify directions, refills and quantity (Patient taking differently: Take 20 mg by mouth daily. )  . POTASSIUM PO Take daily by mouth.  . pravastatin (PRAVACHOL) 40 MG tablet TAKE 1 TABLET BY MOUTH EVERY DAY  . sildenafil (REVATIO) 20 MG  tablet TAKE 1-2 TABLETS (20-40 MG TOTAL) BY MOUTH DAILY. (Patient taking differently: Take 20-40 mg by mouth daily. As needed)  . spironolactone (ALDACTONE) 25 MG tablet TAKE 1 TABLET BY MOUTH EVERY DAY  . valsartan-hydrochlorothiazide (DIOVAN HCT) 160-25 MG tablet Take 1 tablet by mouth daily. Take in place of atacand-hctz   No facility-administered medications prior to visit.    Review of Systems  Constitutional: Negative.   Respiratory: Negative.   Cardiovascular: Positive for leg swelling. Negative for chest pain and palpitations.  Gastrointestinal: Negative.   Neurological: Negative for dizziness, light-headedness and headaches.    Last CBC Lab Results  Component Value Date   WBC 9.1 08/16/2016   HGB 15.2 08/16/2016   HCT 45.8 08/16/2016   MCV 95 08/16/2016   MCH 31.6 08/16/2016   RDW 13.3 08/16/2016   PLT 215 [id number]   Last metabolic panel Lab Results  Component Value Date   GLUCOSE 112 (H) 06/19/2018   NA 137 06/19/2018   K 4.3 06/19/2018   CL 100 06/19/2018   CO2 24 06/19/2018   BUN 19 06/19/2018   CREATININE 0.76 06/19/2018   GFRNONAA 96 06/19/2018   GFRAA 111 06/19/2018   CALCIUM 9.8 06/19/2018   PHOS 3.3 03/15/2017   PROT 7.1 06/19/2018   ALBUMIN 4.7 06/19/2018   LABGLOB 2.4 06/19/2018   AGRATIO 2.0 06/19/2018   BILITOT 0.9 06/19/2018   ALKPHOS 53 06/19/2018   AST 23 06/19/2018   ALT 32 06/19/2018   Last lipids Lab Results  Component Value Date   CHOL 156 06/19/2018   HDL 58 06/19/2018   LDLCALC 75 06/19/2018   TRIG 114 06/19/2018   CHOLHDL 2.7 06/19/2018   Last hemoglobin A1c No results found for: HGBA1C Last thyroid functions Lab Results  Component Value Date   TSH 0.281 (L) 08/16/2016   T4TOTAL 6.3 08/16/2016   Last vitamin D No results found for: 25OHVITD2, 25OHVITD3, VD25OH Last vitamin B12 and Folate No results found for: VITAMINB12, FOLATE  Objective    BP 132/86 (BP Location: Left Arm, Patient Position: Sitting, Cuff Size:  Large)   Pulse (!) 55   Temp (!) 96.9 F (36.1 C) (Temporal)   Wt 189 lb (85.7 kg)   BMI 27.91 kg/m     Physical Exam Constitutional:      Appearance: Normal appearance.  Cardiovascular:     Rate and Rhythm: Normal rate and regular rhythm.     Pulses: Normal pulses.          Dorsalis pedis pulses are 2+ on the right side and 2+ on the left side.       Posterior tibial pulses are 2+ on the right side and 2+ on the left side.     Heart sounds: Normal heart sounds.  Pulmonary:     Effort: Pulmonary effort is normal.     Breath sounds: Normal breath  sounds.  Musculoskeletal:     Right lower leg: No bony tenderness. 1+ Edema present.     Left lower leg: 1+ Edema present.  Feet:     Right foot:     Skin integrity: Skin integrity normal.     Toenail Condition: Right toenails are normal.     Left foot:     Skin integrity: Skin integrity normal.     Toenail Condition: Left toenails are normal.  Skin:    General: Skin is warm and dry.  Neurological:     Mental Status: He is alert and oriented to person, place, and time. Mental status is at baseline.  Psychiatric:        Mood and Affect: Mood normal.        Behavior: Behavior normal.        Thought Content: Thought content normal.        Judgment: Judgment normal.       No results found for any visits on 05/18/19.  Assessment & Plan     1. Edema, lower extremity New problem Likely secondary to standing on a concrete floor for extended periods of time. Recommend using compression socks during the day.  Pt denies of shortness of breath, unexplained weight gain. Advised pt to call back if symptoms worsening or does not improve.   2. Essential (primary) hypertension Well controlled today. Will check labs today  - Comprehensive metabolic panel  3. Hyperlipidemia, unspecified hyperlipidemia type Doing well on current medications. Will recheck labs today. Continue statin.   - Lipid panel - Comprehensive metabolic  panel  4. Screening for prostate cancer Screening today.   - PSA  5. Need for pneumococcal vaccine Given in the office today.   - Pneumococcal polysaccharide vaccine 23-valent greater than or equal to 2yo subcutaneous/IM   Return if symptoms worsen or fail to improve.      ITrinna Post, PA-C, have reviewed all documentation for this visit. The documentation on 05/18/19 for the exam, diagnosis, procedures, and orders are all accurate and complete.    Paulene Floor  [facility] [phone] (phone) [phone] (fax)  [place]

## 2019-05-18 ENCOUNTER — Ambulatory Visit (INDEPENDENT_AMBULATORY_CARE_PROVIDER_SITE_OTHER): Payer: Medicare Other | Admitting: Physician Assistant

## 2019-05-18 ENCOUNTER — Other Ambulatory Visit: Payer: Self-pay

## 2019-05-18 ENCOUNTER — Encounter: Payer: Self-pay | Admitting: Physician Assistant

## 2019-05-18 VITALS — BP 132/86 | HR 55 | Temp 96.9°F | Wt 189.0 lb

## 2019-05-18 DIAGNOSIS — E785 Hyperlipidemia, unspecified: Secondary | ICD-10-CM

## 2019-05-18 DIAGNOSIS — Z125 Encounter for screening for malignant neoplasm of prostate: Secondary | ICD-10-CM | POA: Diagnosis not present

## 2019-05-18 DIAGNOSIS — R600 Localized edema: Secondary | ICD-10-CM

## 2019-05-18 DIAGNOSIS — I10 Essential (primary) hypertension: Secondary | ICD-10-CM

## 2019-05-18 DIAGNOSIS — Z23 Encounter for immunization: Secondary | ICD-10-CM | POA: Diagnosis not present

## 2019-05-18 NOTE — Patient Instructions (Signed)
Try SockWell Compression socks or similar brand.    Edema  Edema is when you have too much fluid in your body or under your skin. Edema may make your legs, feet, and ankles swell up. Swelling is also common in looser tissues, like around your eyes. This is a common condition. It gets more common as you get older. There are many possible causes of edema. Eating too much salt (sodium) and being on your feet or sitting for a long time can cause edema in your legs, feet, and ankles. Hot weather may make edema worse. Edema is usually painless. Your skin may look swollen or shiny. Follow these instructions at home:  Keep the swollen body part raised (elevated) above the level of your heart when you are sitting or lying down.  Do not sit still or stand for a long time.  Do not wear tight clothes. Do not wear garters on your upper legs.  Exercise your legs. This can help the swelling go down.  Wear elastic bandages or support stockings as told by your doctor.  Eat a low-salt (low-sodium) diet to reduce fluid as told by your doctor.  Depending on the cause of your swelling, you may need to limit how much fluid you drink (fluid restriction).  Take over-the-counter and prescription medicines only as told by your doctor. Contact a doctor if:  Treatment is not working.  You have heart, liver, or kidney disease and have symptoms of edema.  You have sudden and unexplained weight gain. Get help right away if:  You have shortness of breath or chest pain.  You cannot breathe when you lie down.  You have pain, redness, or warmth in the swollen areas.  You have heart, liver, or kidney disease and get edema all of a sudden.  You have a fever and your symptoms get worse all of a sudden. Summary  Edema is when you have too much fluid in your body or under your skin.  Edema may make your legs, feet, and ankles swell up. Swelling is also common in looser tissues, like around your eyes.  Raise  (elevate) the swollen body part above the level of your heart when you are sitting or lying down.  Follow your doctor's instructions about diet and how much fluid you can drink (fluid restriction). This information is not intended to replace advice given to you by your health care provider. Make sure you discuss any questions you have with your health care provider. Document Revised: 12/31/2016 Document Reviewed: 01/16/2016 Elsevier Patient Education  2020 [employer].

## 2019-05-19 ENCOUNTER — Other Ambulatory Visit: Payer: Self-pay | Admitting: Family Medicine

## 2019-05-19 LAB — COMPREHENSIVE METABOLIC PANEL
ALT: 27 IU/L (ref 0–44)
AST: 22 IU/L (ref 0–40)
Albumin/Globulin Ratio: 1.6 (ref 1.2–2.2)
Albumin: 4.1 g/dL (ref 3.8–4.8)
Alkaline Phosphatase: 55 IU/L (ref 39–117)
BUN/Creatinine Ratio: 25 — ABNORMAL HIGH (ref 10–24)
BUN: 20 mg/dL (ref 8–27)
Bilirubin Total: 0.4 mg/dL (ref 0.0–1.2)
CO2: 24 mmol/L (ref 20–29)
Calcium: 9.4 mg/dL (ref 8.6–10.2)
Chloride: 102 mmol/L (ref 96–106)
Creatinine, Ser: 0.81 mg/dL (ref 0.76–1.27)
GFR calc Af Amer: 107 mL/min/{1.73_m2} (ref >59)
GFR calc non Af Amer: 93 mL/min/{1.73_m2} (ref >59)
Globulin, Total: 2.6 g/dL (ref 1.5–4.5)
Glucose: 106 mg/dL — ABNORMAL HIGH (ref 65–99)
Potassium: 4.6 mmol/L (ref 3.5–5.2)
Sodium: 138 mmol/L (ref 134–144)
Total Protein: 6.7 g/dL (ref 6.0–8.5)

## 2019-05-19 LAB — LIPID PANEL
Chol/HDL Ratio: 3.1 ratio (ref 0.0–5.0)
Cholesterol, Total: 162 mg/dL (ref 100–199)
HDL: 52 mg/dL
LDL Chol Calc (NIH): 96 mg/dL (ref 0–99)
Triglycerides: 72 mg/dL (ref 0–149)
VLDL Cholesterol Cal: 14 mg/dL (ref 5–40)

## 2019-05-19 LAB — PSA: Prostate Specific Ag, Serum: 0.9 ng/mL (ref 0.0–4.0)

## 2019-05-19 NOTE — Telephone Encounter (Signed)
Requested Prescriptions  Pending Prescriptions Disp Refills  . spironolactone (ALDACTONE) 25 MG tablet [Pharmacy Med Name: SPIRONOLACTONE 25 MG TABLET] 90 tablet 0    Sig: TAKE 1 TABLET BY MOUTH EVERY DAY     Cardiovascular: Diuretics - Aldosterone Antagonist Passed - 05/19/2019 11:34 AM      Passed - Cr in normal range and within 360 days    Creatinine, Ser  Date Value Ref Range Status  05/18/2019 0.81 0.76 - 1.27 mg/dL Final         Passed - K in normal range and within 360 days    Potassium  Date Value Ref Range Status  05/18/2019 4.6 3.5 - 5.2 mmol/L Final         Passed - Na in normal range and within 360 days    Sodium  Date Value Ref Range Status  05/18/2019 138 134 - 144 mmol/L Final         Passed - Last BP in normal range    BP Readings from Last 1 Encounters:  05/18/19 132/86         Passed - Valid encounter within last 6 months    Recent Outpatient Visits          Yesterday Edema, lower extremity   [place], Adriana M, PA-C   3 months ago Nonintractable headache, unspecified chronicity pattern, unspecified headache type   [facility] Birdie Sons, MD   5 months ago Acute non-recurrent pansinusitis   [facility] Fenton Malling M, [place]   11 months ago Essential (primary) hypertension   [facility] Birdie Sons, MD   1 year ago Mass of left breast   [place], Kirstie Peri, MD      Future Appointments            In 1 month Fisher, Kirstie Peri, MD [facility], [place]

## 2019-05-25 ENCOUNTER — Telehealth: Payer: Self-pay

## 2019-05-25 NOTE — Telephone Encounter (Signed)
Copied from [place] [id number]. Topic: General - Other >> May 25, 2019  8:27 AM [name] wrote: Reason for CRM: Pt called and is requesting to have his lab results. Pt states that if he doesn't answer the phone to leave a vm. Please advise.

## 2019-05-25 NOTE — Telephone Encounter (Signed)
Patient was advised of lab results and states that he will work on lowering his sugars.

## 2019-05-29 ENCOUNTER — Other Ambulatory Visit: Payer: Self-pay | Admitting: Family Medicine

## 2019-05-29 DIAGNOSIS — M5441 Lumbago with sciatica, right side: Secondary | ICD-10-CM

## 2019-05-29 NOTE — Telephone Encounter (Signed)
Medication Refill - Medication: Hydrocodone 10/325  Has the patient contacted their pharmacy? No. (Agent: If no, request that the patient contact the pharmacy for the refill.) (Agent: If yes, when and what did the pharmacy advise?)  Preferred Pharm[facility] (with phone number or street name[facility] [place]  Agent: Please be advised that RX refills may take up to 3 business days. We ask that you follow-up with your pharmacy.  

## 2019-05-30 MED ORDER — HYDROCODONE-ACETAMINOPHEN 10-325 MG PO TABS: 1.0000 | ORAL_TABLET | Freq: Four times a day (QID) | ORAL | 0 refills | Status: DC | PRN

## 2019-06-10 ENCOUNTER — Other Ambulatory Visit: Payer: Self-pay | Admitting: Family Medicine

## 2019-06-10 DIAGNOSIS — N529 Male erectile dysfunction, unspecified: Secondary | ICD-10-CM

## 2019-06-10 NOTE — Telephone Encounter (Signed)
Requested Prescriptions  Pending Prescriptions Disp Refills  . sildenafil (REVATIO) 20 MG tablet [Pharmacy Med Name: SILDENAFIL 20 MG TABLET] 60 tablet 14    Sig: TAKE 1-2 TABLETS (20-40 MG TOTAL) BY MOUTH DAILY.     Urology: Erectile Dysfunction Agents Passed - 06/10/2019 10:04 AM      Passed - Last BP in normal range    BP Readings from Last 1 Encounters:  05/18/19 132/86         Passed - Valid encounter within last 12 months    Recent Outpatient Visits          3 weeks ago Edema, lower extremity   [place], PA-C   4 months ago Nonintractable headache, unspecified chronicity pattern, unspecified headache type   [facility] Birdie Sons, MD   5 months ago Acute non-recurrent pansinusitis   [facility] Fenton Malling M, [place]   11 months ago Essential (primary) hypertension   [facility] Birdie Sons, MD   1 year ago Mass of left breast   [facility] Birdie Sons, MD      Future Appointments            In 1 month Fisher, Kirstie Peri, MD [facility], [place]           \

## 2019-06-12 ENCOUNTER — Other Ambulatory Visit: Payer: Self-pay | Admitting: Family Medicine

## 2019-06-12 DIAGNOSIS — G8929 Other chronic pain: Secondary | ICD-10-CM

## 2019-06-12 NOTE — Telephone Encounter (Signed)
Requested medication (s) are due for refill today: yes  Requested medication (s) are on the active medication list: yes  Last refill:  05/29/19  Future visit scheduled: yes  Notes to clinic:  not delegated    Requested Prescriptions  Pending Prescriptions Disp Refills   HYDROcodone-acetaminophen (NORCO) 10-325 MG tablet 60 tablet 0    Sig: Take 1 tablet by mouth every 6 (six) hours as needed.      Not Delegated - Analgesics:  Opioid Agonist Combinations Failed - 06/12/2019  8:31 AM      Failed - This refill cannot be delegated      Failed - Urine Drug Screen completed in last 360 days.      Passed - Valid encounter within last 6 months    Recent Outpatient Visits           3 weeks ago Edema, lower extremity   [place], [place], [place]   4 months ago Nonintractable headache, unspecified chronicity pattern, unspecified headache type   [facility] Birdie Sons, MD   6 months ago Acute non-recurrent pansinusitis   [facility] Fenton Malling M, [place]   11 months ago Essential (primary) hypertension   [facility] Birdie Sons, MD   1 year ago Mass of left breast   [facility] Birdie Sons, MD       Future Appointments             In 1 month Fisher, Kirstie Peri, MD [facility], [place]

## 2019-06-12 NOTE — Telephone Encounter (Signed)
HYDROcodone-acetaminophen ([place]) 10-325 MG tablet  [id number], [place] Phone:  [phone]  Fax:  [phone]     Please refill per pt request  Pt had appt  May 7 and in sch for visit July 2

## 2019-06-12 NOTE — Telephone Encounter (Signed)
15 day supply dispensed 05-30-2019

## 2019-06-13 MED ORDER — HYDROCODONE-ACETAMINOPHEN 10-325 MG PO TABS: 1.0000 | ORAL_TABLET | Freq: Four times a day (QID) | ORAL | 0 refills | Status: DC | PRN

## 2019-06-26 ENCOUNTER — Other Ambulatory Visit: Payer: Self-pay | Admitting: Family Medicine

## 2019-06-26 DIAGNOSIS — G8929 Other chronic pain: Secondary | ICD-10-CM

## 2019-06-26 MED ORDER — HYDROCODONE-ACETAMINOPHEN 10-325 MG PO TABS: 1.0000 | ORAL_TABLET | Freq: Four times a day (QID) | ORAL | 0 refills | Status: DC | PRN

## 2019-06-26 NOTE — Telephone Encounter (Signed)
Requested medication (s) are due for refill today: yes  Requested medication (s) are on the active medication list: yes  Last refill:  06/13/19  Future visit scheduled: yes  Notes to clinic:  not delegated    Requested Prescriptions  Pending Prescriptions Disp Refills   HYDROcodone-acetaminophen (NORCO) 10-325 MG tablet 60 tablet 0    Sig: Take 1 tablet by mouth every 6 (six) hours as needed.      Not Delegated - Analgesics:  Opioid Agonist Combinations Failed - 06/26/2019  8:35 AM      Failed - This refill cannot be delegated      Failed - Urine Drug Screen completed in last 360 days.      Passed - Valid encounter within last 6 months    Recent Outpatient Visits           1 month ago Edema, lower extremity   [place], [place], [place]   4 months ago Nonintractable headache, unspecified chronicity pattern, unspecified headache type   [facility] Birdie Sons, MD   6 months ago Acute non-recurrent pansinusitis   [facility] [place], Clearnce Sorrel, [place]   1 year ago Essential (primary) hypertension   [facility] Birdie Sons, MD   1 year ago Mass of left breast   [facility] Birdie Sons, MD       Future Appointments             In 2 weeks Fisher, Kirstie Peri, MD [facility], [place]

## 2019-06-26 NOTE — Telephone Encounter (Addendum)
Pt is calling and needs a refill on hydrocodone. [facility] [place] [address]. Pt has an appt with dr Caryn Section on 07/13/2019

## 2019-07-09 ENCOUNTER — Other Ambulatory Visit: Payer: Self-pay | Admitting: Family Medicine

## 2019-07-09 DIAGNOSIS — G8929 Other chronic pain: Secondary | ICD-10-CM

## 2019-07-09 NOTE — Telephone Encounter (Signed)
Requested medication (s) are due for refill today: yes  Requested medication (s) are on the active medication list: yes  Last refill:  06/26/2019  Future visit scheduled: yes  Notes to clinic:  this refill cannot be delegated    Requested Prescriptions  Pending Prescriptions Disp Refills   HYDROcodone-acetaminophen (NORCO) 10-325 MG tablet 60 tablet 0    Sig: Take 1 tablet by mouth every 6 (six) hours as needed.      Not Delegated - Analgesics:  Opioid Agonist Combinations Failed - 07/09/2019  3:12 PM      Failed - This refill cannot be delegated      Failed - Urine Drug Screen completed in last 360 days.      Passed - Valid encounter within last 6 months    Recent Outpatient Visits           1 month ago Edema, lower extremity   [place], [place], [place]   5 months ago Nonintractable headache, unspecified chronicity pattern, unspecified headache type   [facility] Birdie Sons, MD   6 months ago Acute non-recurrent pansinusitis   [facility] [place], Clearnce Sorrel, [place]   1 year ago Essential (primary) hypertension   [facility] Birdie Sons, MD   1 year ago Mass of left breast   [place], MD       Future Appointments             In 4 days Fisher, Kirstie Peri, MD [facility], [place]

## 2019-07-09 NOTE — Telephone Encounter (Signed)
Copied from [place] [id number]. Topic: Quick Communication - Rx Refill/Question >> Jul 09, 2019  3:08 PM Leward Quan A wrote: Medication: HYDROcodone-acetaminophen ([place]) 10-325 MG tablet    Has the patient contacted their pharmacy? Yes.   (Agent: If no, request that the patient contact the pharmacy for the refill.) (Agent: If yes, when and what did the pharmacy advise?)  Preferred Pharmacy (with phone number or street name[id number] [place] [place]  Phone:  [phone] Fax:  [phone]     Agent: Please be advised that RX refills may take up to 3 business days. We ask that you follow-up with your pharmacy.

## 2019-07-12 MED ORDER — HYDROCODONE-ACETAMINOPHEN 10-325 MG PO TABS: 1.0000 | ORAL_TABLET | Freq: Four times a day (QID) | ORAL | 0 refills | Status: DC | PRN

## 2019-07-13 ENCOUNTER — Ambulatory Visit: Payer: Medicare Other | Admitting: Family Medicine

## 2019-07-23 ENCOUNTER — Other Ambulatory Visit: Payer: Self-pay | Admitting: Family Medicine

## 2019-07-23 DIAGNOSIS — I10 Essential (primary) hypertension: Secondary | ICD-10-CM

## 2019-07-24 ENCOUNTER — Other Ambulatory Visit: Payer: Self-pay | Admitting: Family Medicine

## 2019-07-24 DIAGNOSIS — M5441 Lumbago with sciatica, right side: Secondary | ICD-10-CM

## 2019-07-24 NOTE — Telephone Encounter (Signed)
HYDROcodone-acetaminophen (NORCO) 10-325 MG tablet     Patient is requesting refill.    [id number] [place] [place] Phone:  [phone]  Fax:  [phone]

## 2019-07-24 NOTE — Telephone Encounter (Signed)
Requested medication (s) are due for refill today: no   Requested medication (s) are on the active medication list: yes  Last refill:  07/12/2019  Future visit scheduled: yes  Notes to clinic:  this refill cannot be delegated    Requested Prescriptions  Pending Prescriptions Disp Refills   HYDROcodone-acetaminophen (NORCO) 10-325 MG tablet 60 tablet 0    Sig: Take 1 tablet by mouth every 6 (six) hours as needed.      Not Delegated - Analgesics:  Opioid Agonist Combinations Failed - 07/24/2019  8:40 AM      Failed - This refill cannot be delegated      Failed - Urine Drug Screen completed in last 360 days.      Passed - Valid encounter within last 6 months    Recent Outpatient Visits           2 months ago Edema, lower extremity   [place], [place], [place]   5 months ago Nonintractable headache, unspecified chronicity pattern, unspecified headache type   [facility] Birdie Sons, MD   7 months ago Acute non-recurrent pansinusitis   [facility] [place], Clearnce Sorrel, [place]   1 year ago Essential (primary) hypertension   [facility] Birdie Sons, MD   1 year ago Mass of left breast   [facility] Birdie Sons, MD       Future Appointments             In 1 month Fisher, Kirstie Peri, MD [facility], [place]

## 2019-07-26 MED ORDER — HYDROCODONE-ACETAMINOPHEN 10-325 MG PO TABS: 1.0000 | ORAL_TABLET | Freq: Four times a day (QID) | ORAL | 0 refills | Status: DC | PRN

## 2019-07-26 NOTE — Telephone Encounter (Signed)
Requested medication (s) are due for refill today: yes  Requested medication (s) are on the active medication list:yes  Last refill: 07/09/19  #60 0 refills  Future visit scheduled: yes  Notes to clinic: Not delegated    Requested Prescriptions  Pending Prescriptions Disp Refills   HYDROcodone-acetaminophen (NORCO) 10-325 MG tablet 60 tablet 0    Sig: Take 1 tablet by mouth every 6 (six) hours as needed.      Not Delegated - Analgesics:  Opioid Agonist Combinations Failed - 07/26/2019  4:35 PM      Failed - This refill cannot be delegated      Failed - Urine Drug Screen completed in last 360 days.      Passed - Valid encounter within last 6 months    Recent Outpatient Visits           2 months ago Edema, lower extremity   [place], [place], [place]   5 months ago Nonintractable headache, unspecified chronicity pattern, unspecified headache type   [facility] Birdie Sons, MD   7 months ago Acute non-recurrent pansinusitis   [facility] [place], Clearnce Sorrel, [place]   1 year ago Essential (primary) hypertension   [facility] Birdie Sons, MD   1 year ago Mass of left breast   [facility] Birdie Sons, MD       Future Appointments             In 1 month Fisher, Kirstie Peri, MD [facility], [place]

## 2019-07-26 NOTE — Telephone Encounter (Signed)
Patient would like medication request expedited before the weekend, Patient stated he is completely out of medication.

## 2019-07-27 ENCOUNTER — Other Ambulatory Visit: Payer: Self-pay | Admitting: Family Medicine

## 2019-08-08 ENCOUNTER — Other Ambulatory Visit: Payer: Self-pay | Admitting: Family Medicine

## 2019-08-08 DIAGNOSIS — G8929 Other chronic pain: Secondary | ICD-10-CM

## 2019-08-08 NOTE — Telephone Encounter (Signed)
PT need a refill  HYDROcodone-acetaminophen ([place]) 10-325 MG tablet [[id number]] [id number], [place] - [address]  [place] [address]  Phone: [phone] Fax: [phone]

## 2019-08-08 NOTE — Telephone Encounter (Signed)
Refill prescription due 08-10-2019

## 2019-08-08 NOTE — Addendum Note (Signed)
Addended by: Marijo Conception on: 08/08/2019 08:30 AM   Modules accepted: Orders

## 2019-08-10 MED ORDER — HYDROCODONE-ACETAMINOPHEN 10-325 MG PO TABS: 1.0000 | ORAL_TABLET | Freq: Four times a day (QID) | ORAL | 0 refills | Status: DC | PRN

## 2019-08-10 NOTE — Telephone Encounter (Signed)
Patient is calling to check the status of his med request.  He stated he will be out of town this weekend and would like it before he leaves.  Please advise.

## 2019-08-17 ENCOUNTER — Telehealth: Payer: Self-pay | Admitting: Family Medicine

## 2019-08-17 NOTE — Telephone Encounter (Signed)
Copied from [place] [id number]. Topic: General - Other >> Aug 17, 2019  8:18 AM Keene Breath wrote: Reason for CRM: Patient would like to discuss with the nurse his stomach issues he is having.  He's not sure if he needs an appt.  He also said he thinks he needs some blood work, but not sure.   But he thinks it is because his cholesterol was running high and the medication.  Please advise and call patient to discuss at [phone]

## 2019-08-17 NOTE — Telephone Encounter (Signed)
Attempted to contact patient, no answer left a voicemail top call the office back to schedule a OV.

## 2019-08-19 ENCOUNTER — Other Ambulatory Visit: Payer: Self-pay | Admitting: Family Medicine

## 2019-08-19 NOTE — Telephone Encounter (Signed)
Requested Prescriptions  Pending Prescriptions Disp Refills  . spironolactone (ALDACTONE) 25 MG tablet [Pharmacy Med Name: SPIRONOLACTONE 25 MG TABLET] 90 tablet 0    Sig: TAKE 1 TABLET BY MOUTH EVERY DAY     Cardiovascular: Diuretics - Aldosterone Antagonist Passed - 08/19/2019  9:12 AM      Passed - Cr in normal range and within 360 days    Creatinine, Ser  Date Value Ref Range Status  05/18/2019 0.81 0.76 - 1.27 mg/dL Final         Passed - K in normal range and within 360 days    Potassium  Date Value Ref Range Status  05/18/2019 4.6 3.5 - 5.2 mmol/L Final         Passed - Na in normal range and within 360 days    Sodium  Date Value Ref Range Status  05/18/2019 138 134 - 144 mmol/L Final         Passed - Last BP in normal range    BP Readings from Last 1 Encounters:  05/18/19 132/86         Passed - Valid encounter within last 6 months    Recent Outpatient Visits          3 months ago Edema, lower extremity   [place], Adriana M, PA-C   6 months ago Nonintractable headache, unspecified chronicity pattern, unspecified headache type   [facility] Birdie Sons, MD   8 months ago Acute non-recurrent pansinusitis   [facility] [place], Clearnce Sorrel, [place]   1 year ago Essential (primary) hypertension   [facility] Birdie Sons, MD   1 year ago Mass of left breast   [place], Kirstie Peri, MD      Future Appointments            In 2 weeks Fisher, Kirstie Peri, MD [facility], [place]

## 2019-08-21 ENCOUNTER — Other Ambulatory Visit: Payer: Self-pay | Admitting: Family Medicine

## 2019-08-21 DIAGNOSIS — G4700 Insomnia, unspecified: Secondary | ICD-10-CM

## 2019-08-21 NOTE — Telephone Encounter (Signed)
Requested medication (s) are due for refill today:yes  Requested medication (s) are on the active medication list: yes  Last refill: 01/31/19  #90  3 refills  Future visit scheduled  yes  09/04/19  Notes to clinic  not delegated  Requested Prescriptions  Pending Prescriptions Disp Refills   clonazePAM (KLONOPIN) 0.5 MG tablet [Pharmacy Med Name: CLONAZEPAM 0.5 MG TABLET] 90 tablet     Sig: TAKE 1 TABLET (0.5 MG TOTAL) BY MOUTH EVERY 8 HOURS AS NEEDED FOR ANXIETY.      Not Delegated - Psychiatry:  Anxiolytics/Hypnotics Failed - 08/21/2019  4:37 PM      Failed - This refill cannot be delegated      Failed - Urine Drug Screen completed in last 360 days.      Passed - Valid encounter within last 6 months    Recent Outpatient Visits           3 months ago Edema, lower extremity   [place], [place], [place]   6 months ago Nonintractable headache, unspecified chronicity pattern, unspecified headache type   [facility] Birdie Sons, MD   8 months ago Acute non-recurrent pansinusitis   [facility] [place], Clearnce Sorrel, [place]   1 year ago Essential (primary) hypertension   [facility] Birdie Sons, MD   1 year ago Mass of left breast   [facility] Birdie Sons, MD       Future Appointments             In 2 weeks Fisher, Kirstie Peri, MD [facility], [place]

## 2019-08-22 ENCOUNTER — Other Ambulatory Visit: Payer: Self-pay | Admitting: Family Medicine

## 2019-08-22 DIAGNOSIS — G8929 Other chronic pain: Secondary | ICD-10-CM

## 2019-08-22 NOTE — Telephone Encounter (Signed)
Medication Refill - Medication: HYDROcodone-acetaminophen ([place]) 10-325 MG tablet [[id number]]     Preferred Pharmacy (with phone number or street name):  [id number] [place] [place]  [place] [address]  Phone: [phone] Fax: [phone]  Hours: Not open 24 hours     Agent: Please be advised that RX refills may take up to 3 business days. We ask that you follow-up with your pharmacy.

## 2019-08-22 NOTE — Telephone Encounter (Signed)
Requested medication (s) are due for refill today: no  Requested medication (s) are on the active medication list: yes  Last refill: 08/08/2019  Future visit scheduled: yes  Notes to clinic:  this refill cannot be delegated  Patient will be out of town this weekend  Requested Prescriptions  Pending Prescriptions Disp Refills   HYDROcodone-acetaminophen (NORCO) 10-325 MG tablet 60 tablet 0    Sig: Take 1 tablet by mouth every 6 (six) hours as needed.      Not Delegated - Analgesics:  Opioid Agonist Combinations Failed - 08/22/2019 10:55 AM      Failed - This refill cannot be delegated      Failed - Urine Drug Screen completed in last 360 days.      Passed - Valid encounter within last 6 months    Recent Outpatient Visits           3 months ago Edema, lower extremity   [place], [place], [place]   6 months ago Nonintractable headache, unspecified chronicity pattern, unspecified headache type   [facility] Birdie Sons, MD   8 months ago Acute non-recurrent pansinusitis   [facility] [employer], Clearnce Sorrel, [place]   1 year ago Essential (primary) hypertension   [facility] Birdie Sons, MD   1 year ago Mass of left breast   [facility] Birdie Sons, MD       Future Appointments             In 1 week Fisher, Kirstie Peri, MD [facility], [place]

## 2019-08-24 NOTE — Telephone Encounter (Signed)
Medication Refill - Medication: Hydrocodone  Has the patient contacted their pharmacy? Yes.   (Agent: If no, request that the patient contact the pharmacy for the refill.) (Agent: If yes, when and what did the pharmacy advise?)  Preferred Pharm[facility] phone [id number] [place] [place]   Agent: Please be advised that RX refills may take up to 3 business days. We ask that you follow-up with your pharmacy.

## 2019-08-25 MED ORDER — HYDROCODONE-ACETAMINOPHEN 10-325 MG PO TABS: 1.0000 | ORAL_TABLET | Freq: Four times a day (QID) | ORAL | 0 refills | Status: DC | PRN

## 2019-09-04 ENCOUNTER — Ambulatory Visit: Payer: Medicare Other | Admitting: Family Medicine

## 2019-09-10 ENCOUNTER — Ambulatory Visit: Payer: Self-pay

## 2019-09-10 ENCOUNTER — Other Ambulatory Visit: Payer: Self-pay | Admitting: Family Medicine

## 2019-09-10 DIAGNOSIS — M5441 Lumbago with sciatica, right side: Secondary | ICD-10-CM

## 2019-09-10 DIAGNOSIS — R739 Hyperglycemia, unspecified: Secondary | ICD-10-CM

## 2019-09-10 NOTE — Telephone Encounter (Signed)
Have placed future order for a1c for hyperglycemia.

## 2019-09-10 NOTE — Telephone Encounter (Signed)
Requested medication (s) are due for refill today: yes  Requested medication (s) are on the active medication list: yes   Last refill:  08/25/2019  Future visit scheduled: No  Notes to clinic: Pt states he has pinched nerve and he has had to take more than usual.  Pt hopes he can get Rx asap   Requested Prescriptions  Pending Prescriptions Disp Refills   HYDROcodone-acetaminophen (NORCO) 10-325 MG tablet 60 tablet 0    Sig: Take 1 tablet by mouth every 6 (six) hours as needed.      Not Delegated - Analgesics:  Opioid Agonist Combinations Failed - 09/10/2019  8:31 AM      Failed - This refill cannot be delegated      Failed - Urine Drug Screen completed in last 360 days.      Passed - Valid encounter within last 6 months    Recent Outpatient Visits           3 months ago Edema, lower extremity   [place], [place], [place]   7 months ago Nonintractable headache, unspecified chronicity pattern, unspecified headache type   [facility] Birdie Sons, MD   9 months ago Acute non-recurrent pansinusitis   [facility] [place], Clearnce Sorrel, [place]   1 year ago Essential (primary) hypertension   [facility] Birdie Sons, MD   1 year ago Mass of left breast   [facility] Birdie Sons, MD

## 2019-09-10 NOTE — Telephone Encounter (Signed)
Pt request refill  HYDROcodone-acetaminophen ([place]) 10-325 MG tablet   [id number] [place] [place] Phone:  [phone]  Fax:  [phone]     Pt states he has pinched nerve and he has had to take more than usual.  Pt hopes he can get Rx asap.

## 2019-09-10 NOTE — Addendum Note (Signed)
Addended by: Birdie Sons on: 09/10/2019 05:49 PM   Modules accepted: Orders

## 2019-09-10 NOTE — Telephone Encounter (Signed)
Pt. Reports at his last visit Dr. Caryn Section "wanted to check my blood sugar, but I never went to the lab." Reports he is now concerned "it could be up. I'm having increased thirst and headaches sometimes." Request an order for lab work. Cannot come in until he quarantines for COVID 19 exposure.Please advise pt.  Gary Carter Male, 69 y.o., [birth year] MRN:  [id number] Phone:  [phone] (H) PCP:  Birdie Sons, MD Primary Cvg:  [employer] Choice Care Message from Scherrie Gerlach sent at 09/10/2019 8:37 AM EDT  Pt thinks his blood sugar may be high. He said last time Dr Caryn Section wanted to check it, but pt let w/out doing lab. Pt has no way to check his sugar. Pt does not know what symptoms are associated w/ high blood sugar.  Pt was exposed to a covid pos person last Monday, so he cannot come in for appt. Pt wants to know if perhaps dr will put in an order for lab to check his blood sugar.   Call History   Type Contact Phone  09/10/2019 08:22 AM EDT Phone (Incoming) [place], [place] (Self) [phone] (H)  User: Scherrie Gerlach

## 2019-09-11 MED ORDER — HYDROCODONE-ACETAMINOPHEN 10-325 MG PO TABS: 1.0000 | ORAL_TABLET | Freq: Four times a day (QID) | ORAL | 0 refills | Status: DC | PRN

## 2019-09-11 NOTE — Telephone Encounter (Signed)
Pt called today to check on the status of his refill request for HYDROcodone / please advise/ Pt states he is out of medication

## 2019-09-11 NOTE — Telephone Encounter (Signed)
Patient was advised and states he will be in the office next week to have blood work done.

## 2019-09-14 ENCOUNTER — Other Ambulatory Visit: Payer: Self-pay

## 2019-09-14 DIAGNOSIS — R739 Hyperglycemia, unspecified: Secondary | ICD-10-CM | POA: Diagnosis not present

## 2019-09-15 LAB — HEMOGLOBIN A1C
Est. average glucose Bld gHb Est-mCnc: 114 mg/dL
Hgb A1c MFr Bld: 5.6 % (ref 4.8–5.6)

## 2019-09-21 ENCOUNTER — Telehealth: Payer: Self-pay | Admitting: Family Medicine

## 2019-09-21 NOTE — Telephone Encounter (Signed)
error:[id number] ° °

## 2019-09-24 ENCOUNTER — Other Ambulatory Visit: Payer: Self-pay | Admitting: Family Medicine

## 2019-09-24 DIAGNOSIS — G8929 Other chronic pain: Secondary | ICD-10-CM

## 2019-09-24 NOTE — Telephone Encounter (Signed)
Medication Refill - Medication: hydrocodone   Has the patient contacted their pharmacy? Yes.   Pt states that he will be out of the medication tomorrow. Please advise.  (Agent: If no, request that the patient contact the pharmacy for the refill.) (Agent: If yes, when and what did the pharmacy advise?)  Preferred Pharmacy (with phone number or street name):  [id number] [place]  [place] [address]  Phone: [phone] Fax: [phone]  Hours: Not open 24 hours     Agent: Please be advised that RX refills may take up to 3 business days. We ask that you follow-up with your pharmacy.

## 2019-09-24 NOTE — Telephone Encounter (Signed)
Requested medication (s) are due for refill today: Yes  Requested medication (s) are on the active medication list: Yes  Last refill:  09/11/19  Future visit scheduled: No  Notes to clinic:  See request.    Requested Prescriptions  Pending Prescriptions Disp Refills   HYDROcodone-acetaminophen (NORCO) 10-325 MG tablet 60 tablet 0    Sig: Take 1 tablet by mouth every 6 (six) hours as needed.      Not Delegated - Analgesics:  Opioid Agonist Combinations Failed - 09/24/2019  9:46 AM      Failed - This refill cannot be delegated      Failed - Urine Drug Screen completed in last 360 days.      Passed - Valid encounter within last 6 months    Recent Outpatient Visits           4 months ago Edema, lower extremity   [place], [place], [place]   7 months ago Nonintractable headache, unspecified chronicity pattern, unspecified headache type   [facility] Birdie Sons, MD   9 months ago Acute non-recurrent pansinusitis   [facility] [place], Clearnce Sorrel, [place]   1 year ago Essential (primary) hypertension   [facility] Birdie Sons, MD   1 year ago Mass of left breast   [facility] Birdie Sons, MD

## 2019-09-25 NOTE — Telephone Encounter (Signed)
Please review. Thanks!  

## 2019-09-25 NOTE — Telephone Encounter (Signed)
Pt called and stated he was told by possibly another agent that the refill for Hydrocodone was sent to the pharmacy/ I advised Pt it had not been sent yet / please advise pt when refill is sent / Pt stated he will be out of medication tomorrow   HYDROcodone-acetaminophen ([place]) 10-325 MG tablet  send [facility]   [id number] [place]  [place], [place] [address]  Phone:  [phone] Fax:  [phone]

## 2019-09-26 MED ORDER — HYDROCODONE-ACETAMINOPHEN 10-325 MG PO TABS: 1.0000 | ORAL_TABLET | Freq: Four times a day (QID) | ORAL | 0 refills | Status: DC | PRN

## 2019-10-01 ENCOUNTER — Other Ambulatory Visit: Payer: Self-pay | Admitting: Family Medicine

## 2019-10-03 ENCOUNTER — Other Ambulatory Visit: Payer: Self-pay

## 2019-10-03 ENCOUNTER — Encounter: Payer: Self-pay | Admitting: Emergency Medicine

## 2019-10-03 ENCOUNTER — Telehealth: Payer: Medicare Other | Admitting: Physician Assistant

## 2019-10-03 ENCOUNTER — Ambulatory Visit
Admission: EM | Admit: 2019-10-03 | Discharge: 2019-10-03 | Disposition: A | Discharge status: Home / Self Care | Payer: Medicare Other | Attending: Family Medicine | Admitting: Family Medicine

## 2019-10-03 DIAGNOSIS — J988 Other specified respiratory disorders: Secondary | ICD-10-CM

## 2019-10-03 DIAGNOSIS — Z20822 Contact with and (suspected) exposure to covid-19: Secondary | ICD-10-CM

## 2019-10-03 LAB — SARS CORONAVIRUS 2 (TAT 6-24 HRS): SARS Coronavirus 2: NEGATIVE

## 2019-10-03 MED ORDER — BENZONATATE 200 MG PO CAPS: 200.0000 mg | ORAL_CAPSULE | Freq: Three times a day (TID) | ORAL | 0 refills | Status: DC | PRN

## 2019-10-03 NOTE — ED Triage Notes (Signed)
Patient in today c/o headache, sinus pressure/pain, cough and body aches x 1 week. Patient states he had chills and sweats, but hasn't taken his temperature. Patient has had the covid vaccines.

## 2019-10-03 NOTE — Discharge Instructions (Addendum)
Cough medication as directed.  Tylenol 1000 mg 3-4 times daily for headache.  Stay home.  Take care  Dr. Lacinda Axon

## 2019-10-03 NOTE — ED Provider Notes (Signed)
[facility]    CSN: [id number] Arrival date & time: 10/03/19  0806      History   Chief Complaint Chief Complaint  Patient presents with  . Headache  . Sinus Problem  . Cough  . Generalized Body Aches   HPI  69 year old male presents for evaluation of the above.  1 week history of headache, sinus pressure/congestion, cough, body aches, postnasal drip.  He has had chills and sweats but there is no documented fever.  He has been vaccinated against COVID-19.  However, he has had several sick contacts at work who have tested positive for Covid.  No relieving factors.  Pain 7/10 in severity.  No other associated symptoms.  No other complaints.  Past Medical History:  Diagnosis Date  . Anxiety   . Barrett's esophagus   . GERD (gastroesophageal reflux disease)   . History of chicken pox 06/12/2014   DID have Chicken Pox. DID have Measles. DID have Mumps. DID have Rubella.    . History of dysphagia   . History of fatty infiltration of liver   . History of measles   . History of mumps   . History of rubella   . Hyperlipidemia   . Hypertension   . Sleep apnea   . SOB (shortness of breath)     Patient Active Problem List   Diagnosis Date Noted  . Gastritis 07/20/2018  . Chronic, continuous use of opioids 11/15/2017  . History of kidney stones 05/04/2016  . Bunion 06/12/2014  . Insomnia 06/12/2014  . Panic attack 06/12/2014  . Obstructive apnea 06/12/2014  . Anxiety disorder 06/11/2014  . H/O suicide attempt 05/28/2010  . Esophagitis, reflux 05/15/2008  . Actinic keratoses 05/06/2008  . 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 Allergic rhinitis 03/08/2007  . Arthralgia of hand 12/14/2006  . HLD (hyperlipidemia) 08/15/2006  . Family history of coronary arteriosclerosis 08/13/2006  . 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 Chronic low back pain 12/07/2005  . Depression, recurrent ([place]) 12/05/2005  . Barrett esophagus 02/06/1997  . Essential  (primary) hypertension 02/06/1997  . Current smoker 01/11/1958    Past Surgical History:  Procedure Laterality Date  . ABDOMINAL SURGERY    . BACK SURGERY  12-04-2003   Lumbar fusion & C5-C6 fusion  . ESOPHAGOGASTRODUODENOSCOPY (EGD) WITH PROPOFOL N/A 07/10/2018   Procedure: ESOPHAGOGASTRODUODENOSCOPY (EGD) WITH PROPOFOL;  Surgeon: Manya Silvas, MD;  Location[facility];  Service: Endoscopy;  Laterality: N/A;  . HERNIA REPAIR    . Myocardial perfusion scan  10/10/2006  . NECK SURGERY    . NERVE, TENDON AND ARTERY REPAIR Left 10/18/2012   Procedure: NERVE, TENDON AND ARTERY REPAIR;  Surgeon: Linna Hoff, MD;  Location: [place];  Service: Orthopedics;  Laterality: Left;  . NISSEN FUNDOPLICATION  2992  . UPPER GI ENDOSCOPY  04/09/2007   Dr. Tiffany Kocher; Mucosal changes suspicious for Barretts esophagits  . WOUND EXPLORATION Left 10/18/2012   Procedure: WOUND EXPLORATION;  Surgeon: Linna Hoff, MD;  Location: [place];  Service: Orthopedics;  Laterality: Left;       Home Medications    Prior to Admission medications   Medication Sig Start Date End Date Taking? Authorizing Provider  amLODipine (NORVASC) 10 MG tablet TAKE 1 TABLET BY MOUTH EVERY DAY 11/06/18  Yes Birdie Sons, MD  aspirin 325 MG tablet Take 325 mg by mouth daily.  05/13/08  Yes [provider]  Cholecalciferol (VITAMIN D3 PO) Take daily by mouth.  Yes [provider]  citalopram (CELEXA) 40 MG tablet TAKE 1 TABLET (40 MG TOTAL) DAILY BY MOUTH. 10/15/18  Yes Birdie Sons, MD  clonazePAM (KLONOPIN) 0.5 MG tablet TAKE 1 TABLET (0.5 MG TOTAL) BY MOUTH EVERY 8 HOURS AS NEEDED FOR ANXIETY. 08/22/19  Yes Birdie Sons, MD  Cyanocobalamin (VITAMIN B-12 PO) Take by mouth daily.   Yes [provider]  fluticasone (FLONASE) 50 MCG/ACT nasal spray SPRAY 2 SPRAYS INTO EACH NOSTRIL EVERY DAY Patient taking differently: As needed 01/30/19  Yes [place], Kirstie Peri, MD  HYDROcodone-acetaminophen  (NORCO) 10-325 MG tablet Take 1 tablet by mouth every 6 (six) hours as needed. 09/26/19  Yes Birdie Sons, MD  metoprolol succinate (TOPROL-XL) 50 MG 24 hr tablet TAKE 1 TABLET (50 MG TOTAL) BY MOUTH DAILY. IN THE EVENING 07/23/19  Yes Birdie Sons, MD  omeprazole (PRILOSEC) 20 MG capsule Please specify directions, refills and quantity Patient taking differently: Take 20 mg by mouth daily.  08/28/17  Yes Birdie Sons, MD  pravastatin (PRAVACHOL) 40 MG tablet TAKE 1 TABLET BY MOUTH EVERY DAY 07/27/19  Yes Birdie Sons, MD  sildenafil (REVATIO) 20 MG tablet TAKE 1-2 TABLETS (20-40 MG TOTAL) BY MOUTH DAILY. 06/10/19  Yes Birdie Sons, MD  spironolactone (ALDACTONE) 25 MG tablet TAKE 1 TABLET BY MOUTH EVERY DAY 08/19/19  Yes Birdie Sons, MD  benzonatate (TESSALON) 200 MG capsule Take 1 capsule (200 mg total) by mouth 3 (three) times daily as needed for cough. 10/03/19   Coral Spikes, DO  POTASSIUM PO Take daily by mouth.  10/03/19  [provider]  valsartan-hydrochlorothiazide (DIOVAN-HCT) 160-25 MG tablet TAKE 1 TABLET BY MOUTH DAILY. TAKE IN PLACE OF ATACAND-HCTZ 10/01/19 10/03/19  Birdie Sons, MD    Family History Family History  Problem Relation Age of Onset  . Hypertension Sister   . Bipolar disorder Sister   . Heart attack Father 3  . Emphysema Mother   . Heart disease Mother   . Diabetes Daughter 3  . Bipolar disorder Brother   . Hypertension Brother   . Obesity Brother     Social History Social History   Tobacco Use  . Smoking status: Current Every Day Smoker    Packs/day: 0.50    Years: 5.00    Pack years: 2.50    Types: Cigarettes  . Smokeless tobacco: Never Used  Vaping Use  . Vaping Use: Never used  Substance Use Topics  . Alcohol use: Yes    Alcohol/week: 8.0 standard drinks    Types: 8 Shots of liquor per week    Comment: 2 drinks 4 nights a week  . Drug use: Not Currently    Comment: previously smoked marijuana years ago      Allergies   Hctz [hydrochlorothiazide]   Review of Systems Review of Systems Per HPI Physical Exam Triage Vital Signs ED Triage Vitals  Enc Vitals Group     BP 10/03/19 0822 121/85     Pulse Rate 10/03/19 0822 65     Resp 10/03/19 0822 18     Temp 10/03/19 0822 98.4 F (36.9 C)     Temp Source 10/03/19 0822 Oral     SpO2 10/03/19 0822 98 %     Weight 10/03/19 0822 186 lb (84.4 kg)     Height 10/03/19 0822 5\' 9"  (1.753 m)     Head Circumference --      Peak Flow --  Pain Score 10/03/19 0821 7     Pain Loc --      Pain Edu? --      Excl. in [place]? --    Updated Vital Signs BP 121/85 (BP Location: Left Arm)   Pulse 65   Temp 98.4 F (36.9 C) (Oral)   Resp 18   Ht 5\' 9"  (1.753 m)   Wt 84.4 kg   SpO2 98%   BMI 27.47 kg/m   Visual Acuity Right Eye Distance:   Left Eye Distance:   Bilateral Distance:    Right Eye Near:   Left Eye Near:    Bilateral Near:     Physical Exam Vitals and nursing note reviewed.  Constitutional:      General: He is not in acute distress.    Appearance: Normal appearance. He is not ill-appearing.  Eyes:     General:        Right eye: No discharge.        Left eye: No discharge.     Conjunctiva/sclera: Conjunctivae normal.  Cardiovascular:     Rate and Rhythm: Normal rate and regular rhythm.  Pulmonary:     Effort: Pulmonary effort is normal.     Breath sounds: Normal breath sounds. No wheezing, rhonchi or rales.  Neurological:     Mental Status: He is alert.  Psychiatric:        Mood and Affect: Mood normal.        Behavior: Behavior normal.    UC Treatments / Results  Labs (all labs ordered are listed, but only abnormal results are displayed) Labs Reviewed  SARS CORONAVIRUS 2 (TAT 6-24 HRS)    EKG   Radiology No results found.  Procedures Procedures (including critical care time)  Medications Ordered in UC Medications - No data to display  Initial Impression / Assessment and Plan / UC Course  I have  reviewed the triage vital signs and the nursing notes.  Pertinent labs & imaging results that were available during my care of the patient were reviewed by me and considered in my medical decision making (see chart for details).    69 year old male presents with respiratory infection.  Suspected COVID-19.  Tessalon Perles for cough.  Supportive care.  I have already given his information to the infusion clinic.  Final Clinical Impressions(s) / UC Diagnoses   Final diagnoses:  Respiratory infection  Suspected COVID-19 virus infection     Discharge Instructions     Cough medication as directed.  Tylenol 1000 mg 3-4 times daily for headache.  Stay home.  Take care  Dr. Lacinda Axon    ED Prescriptions    Medication Sig Dispense Auth. Provider   benzonatate (TESSALON) 200 MG capsule Take 1 capsule (200 mg total) by mouth 3 (three) times daily as needed for cough. 30 capsule Coral Spikes, DO     PDMP not reviewed this encounter.   Coral Spikes, [place] 10/03/19 [phone]

## 2019-10-04 ENCOUNTER — Telehealth: Payer: Self-pay | Admitting: Family Medicine

## 2019-10-04 ENCOUNTER — Telehealth (HOSPITAL_COMMUNITY): Payer: Self-pay | Admitting: Emergency Medicine

## 2019-10-04 MED ORDER — AMOXICILLIN-POT CLAVULANATE 875-125 MG PO TABS: 1.0000 | ORAL_TABLET | Freq: Two times a day (BID) | ORAL | 0 refills | Status: DC

## 2019-10-04 NOTE — Telephone Encounter (Signed)
COVID negative. Starting on Augmentin.  [facility]

## 2019-10-04 NOTE — Telephone Encounter (Signed)
Received call from patient concerning COVID result.  Verified identity and provided negative.  Patient requested antibiotic for sinusitis.  Dr. Lacinda Axon made aware, states he will call in.  Patient made aware of plan.

## 2019-10-05 ENCOUNTER — Telehealth: Payer: Self-pay | Admitting: Family Medicine

## 2019-10-05 NOTE — Telephone Encounter (Signed)
Pt went to urgent 10-01-2019 and was prescribed abx and benzonatate on Tuesday 10-02-2019 and pt said benzonatate is not relieving his cough and he would like something with codeine. Pt also has been taking tylenol for his headache. [facility] [place]

## 2019-10-08 ENCOUNTER — Other Ambulatory Visit: Payer: Self-pay | Admitting: Family Medicine

## 2019-10-08 ENCOUNTER — Telehealth: Payer: Self-pay

## 2019-10-08 DIAGNOSIS — G8929 Other chronic pain: Secondary | ICD-10-CM

## 2019-10-08 MED ORDER — HYDROCODONE-HOMATROPINE 5-1.5 MG/5ML PO SYRP: 5.0000 mL | ORAL_SOLUTION | Freq: Three times a day (TID) | ORAL | 0 refills | Status: DC | PRN

## 2019-10-08 NOTE — Telephone Encounter (Signed)
Copied from [place] [phone]. Topic: General - Other >> Oct 08, 2019 12:22 PM Hinda Lenis D wrote: Pharmacy call medication is on back order / HYDROcodone-homatropine (HYCODAN) 5-1.5 MG/5ML syrup [[id number]]

## 2019-10-08 NOTE — Telephone Encounter (Signed)
Pt request refill   HYDROcodone-acetaminophen (NORCO) 10-325 MG tablet  [id number] [place] [place] - [address] Phone:  [phone]  Fax:  [phone]      

## 2019-10-08 NOTE — Telephone Encounter (Signed)
Requested medication (s) are due for refill today - unknown  Requested medication (s) are on the active medication list -yes  Future visit scheduled -no  Last refill: 09/26/19  Notes to clinic: Request for RF non delegated Rx  Requested Prescriptions  Pending Prescriptions Disp Refills   HYDROcodone-acetaminophen (NORCO) 10-325 MG tablet 60 tablet 0    Sig: Take 1 tablet by mouth every 6 (six) hours as needed.      Not Delegated - Analgesics:  Opioid Agonist Combinations Failed - 10/08/2019  8:42 AM      Failed - This refill cannot be delegated      Failed - Urine Drug Screen completed in last 360 days.      Passed - Valid encounter within last 6 months    Recent Outpatient Visits           4 months ago Edema, lower extremity   [facility] Carles Collet M, [place]   8 months ago Nonintractable headache, unspecified chronicity pattern, unspecified headache type   [facility] Birdie Sons, MD   9 months ago Acute non-recurrent pansinusitis   [facility] [place], Clearnce Sorrel, [place]   1 year ago Essential (primary) hypertension   [facility] Birdie Sons, MD   1 year ago Mass of left breast   [place], MD                  Requested Prescriptions  Pending Prescriptions Disp Refills   HYDROcodone-acetaminophen (NORCO) 10-325 MG tablet 60 tablet 0    Sig: Take 1 tablet by mouth every 6 (six) hours as needed.      Not Delegated - Analgesics:  Opioid Agonist Combinations Failed - 10/08/2019  8:42 AM      Failed - This refill cannot be delegated      Failed - Urine Drug Screen completed in last 360 days.      Passed - Valid encounter within last 6 months    Recent Outpatient Visits           4 months ago Edema, lower extremity   [place], [place], [place]   8 months ago Nonintractable headache, unspecified chronicity pattern, unspecified  headache type   [facility] Birdie Sons, MD   9 months ago Acute non-recurrent pansinusitis   [facility] [place], Clearnce Sorrel, [place]   1 year ago Essential (primary) hypertension   [facility] Birdie Sons, MD   1 year ago Mass of left breast   [facility] Birdie Sons, MD

## 2019-10-08 NOTE — Telephone Encounter (Signed)
15 day supply dispensed 09-26-2019

## 2019-10-10 MED ORDER — HYDROCODONE-ACETAMINOPHEN 10-325 MG PO TABS
1.0000 | ORAL_TABLET | Freq: Four times a day (QID) | ORAL | 0 refills | Status: DC | PRN
Start: 2019-10-10 — End: 2019-10-19

## 2019-10-16 ENCOUNTER — Other Ambulatory Visit: Payer: Self-pay | Admitting: Family Medicine

## 2019-10-16 DIAGNOSIS — I10 Essential (primary) hypertension: Secondary | ICD-10-CM

## 2019-10-16 NOTE — Telephone Encounter (Signed)
Requested Prescriptions  Pending Prescriptions Disp Refills   metoprolol succinate (TOPROL-XL) 50 MG 24 hr tablet [Pharmacy Med Name: METOPROLOL SUCC ER 50 MG TAB] 90 tablet 0    Sig: TAKE 1 TABLET (50 MG TOTAL) BY MOUTH DAILY. IN THE EVENING     Cardiovascular:  Beta Blockers Passed - 10/16/2019  1:23 AM      Passed - Last BP in normal range    BP Readings from Last 1 Encounters:  10/03/19 121/85         Passed - Last Heart Rate in normal range    Pulse Readings from Last 1 Encounters:  10/03/19 65         Passed - Valid encounter within last 6 months    Recent Outpatient Visits          5 months ago Edema, lower extremity   [facility] [place], Adriana M, PA-C   8 months ago Nonintractable headache, unspecified chronicity pattern, unspecified headache type   [facility] Birdie Sons, MD   10 months ago Acute non-recurrent pansinusitis   [facility] [place], Clearnce Sorrel, [place]   1 year ago Essential (primary) hypertension   [facility] Birdie Sons, MD   1 year ago Mass of left breast   [facility] Caryn Section, Kirstie Peri, MD

## 2019-10-19 ENCOUNTER — Ambulatory Visit: Payer: Self-pay | Admitting: *Deleted

## 2019-10-19 ENCOUNTER — Other Ambulatory Visit: Payer: Self-pay | Admitting: Family Medicine

## 2019-10-19 DIAGNOSIS — G8929 Other chronic pain: Secondary | ICD-10-CM

## 2019-10-19 NOTE — Telephone Encounter (Signed)
Requested medication (s) are due for refill today - unsure-if continued  Requested medication (s) are on the active medication list -yes  Future visit scheduled -no  Last refill: 1 week  Notes to clinic: Request for non delegated Rx  Requested Prescriptions  Pending Prescriptions Disp Refills   HYDROcodone-acetaminophen (NORCO) 10-325 MG tablet 60 tablet 0    Sig: Take 1 tablet by mouth every 6 (six) hours as needed.      Not Delegated - Analgesics:  Opioid Agonist Combinations Failed - 10/19/2019  3:33 PM      Failed - This refill cannot be delegated      Failed - Urine Drug Screen completed in last 360 days.      Passed - Valid encounter within last 6 months    Recent Outpatient Visits           5 months ago Edema, lower extremity   [facility] Carles Collet M, [place]   8 months ago Nonintractable headache, unspecified chronicity pattern, unspecified headache type   [facility] Birdie Sons, MD   10 months ago Acute non-recurrent pansinusitis   [facility] [place], Clearnce Sorrel, [place]   1 year ago Essential (primary) hypertension   [facility] Birdie Sons, MD   1 year ago Mass of left breast   [place], MD                  Requested Prescriptions  Pending Prescriptions Disp Refills   HYDROcodone-acetaminophen (NORCO) 10-325 MG tablet 60 tablet 0    Sig: Take 1 tablet by mouth every 6 (six) hours as needed.      Not Delegated - Analgesics:  Opioid Agonist Combinations Failed - 10/19/2019  3:33 PM      Failed - This refill cannot be delegated      Failed - Urine Drug Screen completed in last 360 days.      Passed - Valid encounter within last 6 months    Recent Outpatient Visits           5 months ago Edema, lower extremity   [place], [place], [place]   8 months ago Nonintractable headache, unspecified chronicity pattern,  unspecified headache type   [facility] Birdie Sons, MD   10 months ago Acute non-recurrent pansinusitis   [facility] [place], Clearnce Sorrel, [place]   1 year ago Essential (primary) hypertension   [facility] Birdie Sons, MD   1 year ago Mass of left breast   [facility] Birdie Sons, MD

## 2019-10-19 NOTE — Telephone Encounter (Signed)
Medication: HYDROcodone-acetaminophen (NORCO) 10-325 MG tablet [[id number]]   Has the patient contacted their pharmacy? YES  (Agent: If no, request that the patient contact the pharmacy for the refill.) (Agent: If yes, when and what did the pharmacy advise?)    Preferred Pharmacy (with phone number or street name): [id number] [place], [place] [address] Phone: [phone] Fax: [phone] Hours: Not open 24 hours  Agent: Please be advised that RX refills may take up to 3 business days. We ask that you follow-up with your pharmacy.

## 2019-10-19 NOTE — Telephone Encounter (Signed)
Patient is wanting to know what he can take for a sinus headache that he has had for about 2 weeks. Please advise  C/o continued sinus pain with no relief from tylenol throughout the day. Completed course of antibiotics from 10/04/19 Augmentin. Patient requesting to get additional antibiotic until sinus pain gone. Care advise given. Patient verbalized understanding of care advise and to call back if symptoms worsen. Please advise .  Reason for Disposition  [1] Taking antibiotic > 72 hours (3 days) AND [2] sinus pain not improved  Answer Assessment - Initial Assessment Questions 1. LOCATION: "Where does it hurt?"      In sinuses 2. ONSET: "When did the sinus pain start?"  (e.g., hours, days)      Na  3. SEVERITY: "How bad is the pain?"   (Scale 1-10; mild, moderate or severe)   - MILD (1-3): doesn't interfere with normal activities    - MODERATE (4-7): interferes with normal activities (e.g., work or school) or awakens from sleep   - SEVERE (8-10): excruciating pain and patient unable to do any normal activities        Moderate  4. RECURRENT SYMPTOM: "Have you ever had sinus problems before?" If Yes, ask: "When was the last time?" and "What happened that time?"      Yes 5. NASAL CONGESTION: "Is the nose blocked?" If Yes, ask: "Can you open it or must you breathe through the mouth?"     na 6. NASAL DISCHARGE: "Do you have discharge from your nose?" If so ask, "What color?"     na 7. FEVER: "Do you have a fever?" If Yes, ask: "What is it, how was it measured, and when did it start?"      na 8. OTHER SYMPTOMS: "Do you have any other symptoms?" (e.g., sore throat, cough, earache, difficulty breathing)     na 9. PREGNANCY: "Is there any chance you are pregnant?" "When was your last menstrual period?"     na  Answer Assessment - Initial Assessment Questions 1. ANTIBIOTIC: "What antibiotic are you receiving?" "How many times per day?"     10/04/19 augmentin 875-125mg  2. ONSET: "When was the  antibiotic started?"     10/04/19 3. PAIN: "How bad is the sinus pain?"   (Scale 1-10; mild, moderate or severe)   - MILD (1-3): doesn't interfere with normal activities    - MODERATE (4-7): interferes with normal activities (e.g., work or school) or awakens from sleep   - SEVERE (8-10): excruciating pain and patient unable to do any normal activities        moderate 4. FEVER: "Do you have a fever?" If Yes, ask: "What is it, how was it measured, and when did it start?"      na 5. SYMPTOMS: "Are there any other symptoms you're concerned about?" If Yes, ask: "When did it start?"     No continued sinus pressure and pain throughout the day  6. PREGNANCY: "Is there any chance you are pregnant?" "When was your last menstrual period?"     na  Protocols used: SINUS INFECTION ON ANTIBIOTIC FOLLOW-UP CALL-A-AH, SINUS PAIN OR CONGESTION-A-AH

## 2019-10-20 ENCOUNTER — Other Ambulatory Visit: Payer: Self-pay | Admitting: Family Medicine

## 2019-10-20 MED ORDER — AMOXICILLIN-POT CLAVULANATE 875-125 MG PO TABS: 1.0000 | ORAL_TABLET | Freq: Two times a day (BID) | ORAL | 0 refills | Status: AC

## 2019-10-20 NOTE — Telephone Encounter (Signed)
Requested Prescriptions  Pending Prescriptions Disp Refills   citalopram (CELEXA) 40 MG tablet [Pharmacy Med Name: CITALOPRAM HBR 40 MG TABLET] 90 tablet 0    Sig: TAKE 1 TABLET (40 MG TOTAL) DAILY BY MOUTH.     Psychiatry:  Antidepressants - SSRI Passed - 10/20/2019  9:05 AM      Passed - Completed PHQ-2 or PHQ-9 in the last 360 days.      Passed - Valid encounter within last 6 months    Recent Outpatient Visits          5 months ago Edema, lower extremity   [place], [place], [place]   8 months ago Nonintractable headache, unspecified chronicity pattern, unspecified headache type   [facility] Birdie Sons, [facility]   10 months ago Acute non-recurrent pansinusitis   [facility] [place], Clearnce Sorrel, [place]   1 year ago Essential (primary) hypertension   [facility] Birdie Sons, MD   1 year ago Mass of left breast   [facility] Birdie Sons, MD

## 2019-10-22 MED ORDER — HYDROCODONE-ACETAMINOPHEN 10-325 MG PO TABS: 1.0000 | ORAL_TABLET | Freq: Four times a day (QID) | ORAL | 0 refills | Status: DC | PRN

## 2019-11-02 ENCOUNTER — Other Ambulatory Visit: Payer: Self-pay | Admitting: Family Medicine

## 2019-11-02 DIAGNOSIS — M5441 Lumbago with sciatica, right side: Secondary | ICD-10-CM

## 2019-11-02 DIAGNOSIS — G8929 Other chronic pain: Secondary | ICD-10-CM

## 2019-11-02 NOTE — Telephone Encounter (Signed)
Requested medication (s) are due for refill today: no  Requested medication (s) are on the active medication list: yes  Last refill:  10/19/2019  Future visit scheduled: no  Notes to clinic:  this refill cannot be delegated    Requested Prescriptions  Pending Prescriptions Disp Refills   HYDROcodone-acetaminophen (NORCO) 10-325 MG tablet 60 tablet 0    Sig: Take 1 tablet by mouth every 6 (six) hours as needed.      Not Delegated - Analgesics:  Opioid Agonist Combinations Failed - 11/02/2019 11:57 AM      Failed - This refill cannot be delegated      Failed - Urine Drug Screen completed in last 360 days.      Passed - Valid encounter within last 6 months    Recent Outpatient Visits           5 months ago Edema, lower extremity   [facility] Carles Collet M, [place]   9 months ago Nonintractable headache, unspecified chronicity pattern, unspecified headache type   [facility] Birdie Sons, MD   10 months ago Acute non-recurrent pansinusitis   [facility] [place], Clearnce Sorrel, [place]   1 year ago Essential (primary) hypertension   [facility] Birdie Sons, MD   1 year ago Mass of left breast   [facility] Birdie Sons, MD

## 2019-11-02 NOTE — Telephone Encounter (Signed)
Medication Refill - Medication: HYDROcodone-acetaminophen (NORCO) 10-325 MG tablet   Has the patient contacted their pharmacy? yes (Agent: If no, request that the patient contact the pharmacy for the refill.) (Agent: If yes, when and what did the pharmacy advise?)Contact PCP  Preferred Pharmacy (with phone number or street name):  [id number] [place], [place] Phone:  [phone]  Fax:  [phone]       Agent: Please be advised that RX refills may take up to 3 business days. We ask that you follow-up with your pharmacy.

## 2019-11-05 ENCOUNTER — Telehealth: Payer: Self-pay

## 2019-11-05 MED ORDER — HYDROCODONE-ACETAMINOPHEN 10-325 MG PO TABS: 1.0000 | ORAL_TABLET | Freq: Four times a day (QID) | ORAL | 0 refills | Status: DC | PRN

## 2019-11-05 MED ORDER — BUPROPION HCL ER (SR) 150 MG PO TB12
ORAL_TABLET | ORAL | 4 refills | Status: DC
Start: 2019-11-05 — End: 2019-12-26

## 2019-11-05 NOTE — Telephone Encounter (Signed)
I think chantix is still recalled. Does he want to try Zyban (bupropion) again. Those are only prescription medications. Otherwise he can try OTC nicotine patches.

## 2019-11-05 NOTE — Telephone Encounter (Signed)
Patient advised as below. Patient would like to try Zyban again. [facility]

## 2019-11-05 NOTE — Telephone Encounter (Signed)
Copied from [place] [id number]. Topic: General - Inquiry >> Nov 05, 2019 11:18 AM Alease Frame wrote: Reason for CRM: Pt is wanting to quit smoking and he wanting to go through his options to help his process

## 2019-11-13 ENCOUNTER — Other Ambulatory Visit: Payer: Self-pay | Admitting: Family Medicine

## 2019-11-19 ENCOUNTER — Other Ambulatory Visit: Payer: Self-pay | Admitting: Family Medicine

## 2019-11-19 DIAGNOSIS — M5441 Lumbago with sciatica, right side: Secondary | ICD-10-CM

## 2019-11-19 DIAGNOSIS — G8929 Other chronic pain: Secondary | ICD-10-CM

## 2019-11-19 MED ORDER — HYDROCODONE-ACETAMINOPHEN 10-325 MG PO TABS: 1.0000 | ORAL_TABLET | Freq: Four times a day (QID) | ORAL | 0 refills | Status: DC | PRN

## 2019-11-19 NOTE — Telephone Encounter (Signed)
Requested medication (s) are due for refill today: yes  Requested medication (s) are on the active medication list: yes  Last refill:  11/05/19  Future visit scheduled: no  Notes to clinic:  not delegated; no valid encounter within last 6 months    Requested Prescriptions  Pending Prescriptions Disp Refills   HYDROcodone-acetaminophen (NORCO) 10-325 MG tablet 60 tablet 0    Sig: Take 1 tablet by mouth every 6 (six) hours as needed.      Not Delegated - Analgesics:  Opioid Agonist Combinations Failed - 11/19/2019  9:26 AM      Failed - This refill cannot be delegated      Failed - Urine Drug Screen completed in last 360 days      Failed - Valid encounter within last 6 months    Recent Outpatient Visits           6 months ago Edema, lower extremity   [facility] Carles Collet M, PA-C   9 months ago Nonintractable headache, unspecified chronicity pattern, unspecified headache type   [facility] Birdie Sons, MD   11 months ago Acute non-recurrent pansinusitis   [facility] [place], Clearnce Sorrel, [place]   1 year ago Essential (primary) hypertension   [facility] Birdie Sons, MD   1 year ago Mass of left breast   [facility] Caryn Section, Kirstie Peri, MD

## 2019-11-19 NOTE — Telephone Encounter (Signed)
Medication: HYDROcodone-acetaminophen (NORCO) 10-325 MG tablet [[id number]]   Has the patient contacted their pharmacy? YES  (Agent: If no, request that the patient contact the pharmacy for the refill.) (Agent: If yes, when and what did the pharmacy advise?)  Preferred Pharmacy (with phone number or street name): [id number] [place], [place] [address] Phone: [phone] Fax: [phone] Hours: Not open 24 hours    Agent: Please be advised that RX refills may take up to 3 business days. We ask that you follow-up with your pharmacy.

## 2019-11-27 DIAGNOSIS — Z23 Encounter for immunization: Secondary | ICD-10-CM | POA: Diagnosis not present

## 2019-11-28 DIAGNOSIS — L570 Actinic keratosis: Secondary | ICD-10-CM | POA: Diagnosis not present

## 2019-12-03 ENCOUNTER — Other Ambulatory Visit: Payer: Self-pay | Admitting: Family Medicine

## 2019-12-03 DIAGNOSIS — G8929 Other chronic pain: Secondary | ICD-10-CM

## 2019-12-03 NOTE — Telephone Encounter (Signed)
Requested medication (s) are due for refill today: yes  Requested medication (s) are on the active medication list: yes  Last refill:  11/19/2019  Future visit scheduled: no  Notes to clinic:  this refill cannot be delegated    Requested Prescriptions  Pending Prescriptions Disp Refills   HYDROcodone-acetaminophen (NORCO) 10-325 MG tablet 60 tablet 0    Sig: Take 1 tablet by mouth every 6 (six) hours as needed.      Not Delegated - Analgesics:  Opioid Agonist Combinations Failed - 12/03/2019  9:49 AM      Failed - This refill cannot be delegated      Failed - Urine Drug Screen completed in last 360 days      Failed - Valid encounter within last 6 months    Recent Outpatient Visits           6 months ago Edema, lower extremity   [place], [place], PA-C   10 months ago Nonintractable headache, unspecified chronicity pattern, unspecified headache type   [facility] Birdie Sons, MD   11 months ago Acute non-recurrent pansinusitis   [facility] [place], Clearnce Sorrel, [place]   1 year ago Essential (primary) hypertension   [facility] Birdie Sons, MD   1 year ago Mass of left breast   [facility] Caryn Section, Kirstie Peri, MD

## 2019-12-03 NOTE — Telephone Encounter (Signed)
Pt request refill   HYDROcodone-acetaminophen (NORCO) 10-325 MG tablet  [id number] [place] [place] Phone:  [phone]

## 2019-12-04 MED ORDER — HYDROCODONE-ACETAMINOPHEN 10-325 MG PO TABS: 1.0000 | ORAL_TABLET | Freq: Four times a day (QID) | ORAL | 0 refills | Status: DC | PRN

## 2019-12-17 ENCOUNTER — Other Ambulatory Visit: Payer: Self-pay | Admitting: Family Medicine

## 2019-12-17 DIAGNOSIS — G8929 Other chronic pain: Secondary | ICD-10-CM

## 2019-12-17 NOTE — Telephone Encounter (Signed)
Medication:  HYDROcodone-acetaminophen ([place]) 10-325 MG tablet [[id number]]    Has the patient contacted their pharmacy? No (Agent: If no, request that the patient contact the pharmacy for the refill.)   Preferred Pharmacy (with phone number or street name):  [id number] [place], [place] - [address]  [place] [address]  Phone:  [phone] Fax:  [phone]    Agent: Please be advised that RX refills may take up to 3 business days. We ask that you follow-up with your pharmacy.

## 2019-12-17 NOTE — Telephone Encounter (Signed)
Requested medication (s) are due for refill today: yes  Requested medication (s) are on the active medication list: yes  Last refill:  12/03/2019  Future visit scheduled: no  Notes to clinic:  this refill cannot be delegated  Vm left for patient to schedule   Requested Prescriptions  Pending Prescriptions Disp Refills   HYDROcodone-acetaminophen (NORCO) 10-325 MG tablet 60 tablet 0    Sig: Take 1 tablet by mouth every 6 (six) hours as needed.      Not Delegated - Analgesics:  Opioid Agonist Combinations Failed - 12/17/2019  9:41 AM      Failed - This refill cannot be delegated      Failed - Urine Drug Screen completed in last 360 days      Failed - Valid encounter within last 6 months    Recent Outpatient Visits           7 months ago Edema, lower extremity   [place], [place], PA-C   10 months ago Nonintractable headache, unspecified chronicity pattern, unspecified headache type   [facility] Birdie Sons, MD   1 year ago Acute non-recurrent pansinusitis   [facility] [place], Clearnce Sorrel, [place]   1 year ago Essential (primary) hypertension   [facility] Birdie Sons, MD   1 year ago Mass of left breast   [facility] Caryn Section, Kirstie Peri, MD

## 2019-12-17 NOTE — Addendum Note (Signed)
Addended by: Jefferson Fuel on: 12/17/2019 09:41 AM   Modules accepted: Orders

## 2019-12-19 MED ORDER — HYDROCODONE-ACETAMINOPHEN 10-325 MG PO TABS: 1.0000 | ORAL_TABLET | Freq: Four times a day (QID) | ORAL | 0 refills | Status: DC | PRN

## 2019-12-19 NOTE — Telephone Encounter (Signed)
PT call to f/up on his request

## 2019-12-25 ENCOUNTER — Other Ambulatory Visit: Payer: Self-pay | Admitting: Family Medicine

## 2019-12-26 ENCOUNTER — Other Ambulatory Visit: Payer: Self-pay | Admitting: Family Medicine

## 2019-12-31 ENCOUNTER — Ambulatory Visit: Payer: Self-pay | Admitting: *Deleted

## 2019-12-31 ENCOUNTER — Other Ambulatory Visit: Payer: Self-pay | Admitting: Family Medicine

## 2019-12-31 ENCOUNTER — Telehealth: Payer: Self-pay

## 2019-12-31 DIAGNOSIS — I10 Essential (primary) hypertension: Secondary | ICD-10-CM

## 2019-12-31 DIAGNOSIS — G8929 Other chronic pain: Secondary | ICD-10-CM

## 2019-12-31 MED ORDER — HYDROCODONE-ACETAMINOPHEN 10-325 MG PO TABS
1.0000 | ORAL_TABLET | Freq: Four times a day (QID) | ORAL | 0 refills | Status: DC | PRN
Start: 2019-12-31 — End: 2020-01-14

## 2019-12-31 MED ORDER — VALSARTAN-HYDROCHLOROTHIAZIDE 160-25 MG PO TABS: 1.0000 | ORAL_TABLET | Freq: Every day | ORAL | 0 refills | Status: DC

## 2019-12-31 NOTE — Telephone Encounter (Signed)
Summary: question about discontinued medication   Patient states that he attempted to pick up valsartan-hydrochlorothiazide (DIOVAN-HCT) 160-25 MG tablet from the pharmacy. Pharmacist told him this medication was discontinued by the office. Patient would like to know when this medication was discontinued and why. He has been taking regularly.      Per chart- Rx discontinued at ED visit 10/03/19- reason not seen in chart. Patient states he is still taking- was never told to stop and took his last pill today. Request review by PCP and notify if he is to continue.  Reason for Disposition . [1] Prescription refill request for ESSENTIAL medicine (i.e., likelihood of harm to patient if not taken) AND [2] triager unable to refill per department policy  Answer Assessment - Initial Assessment Questions 1. DRUG NAME: "What medicine do you need to have refilled?"     Valsartan-hydrochlorothiazide 160-25 mg 2. REFILLS REMAINING: "How many refills are remaining?" (Note: The label on the medicine or pill bottle will show how many refills are remaining. If there are no refills remaining, then a renewal may be needed.)     Patient took last pill today- RF request was declined due to discontinuation of medication 3. EXPIRATION DATE: "What is the expiration date?" (Note: The label states when the prescription will expire, and thus can no longer be refilled.)     n/a 4. PRESCRIBING HCP: "Who prescribed it?" Reason: If prescribed by specialist, call should be referred to that group.     PCP 5. SYMPTOMS: "Do you have any symptoms?"     n/a 6. PREGNANCY: "Is there any chance that you are pregnant?" "When was your last menstrual period?"     n/a  Protocols used: MEDICATION REFILL AND RENEWAL CALL-A-AH

## 2019-12-31 NOTE — Telephone Encounter (Signed)
It looks like this medication was discontinued at an urgent care on 10/03/2019. I called and advised patient of this. He says that they must have taken the medication off by mistake, but he is still supposed to be taking this medication. Patient is requesting a refill. Please review

## 2019-12-31 NOTE — Telephone Encounter (Signed)
Patient advised that prescription has been sent into pharmacy.  

## 2019-12-31 NOTE — Telephone Encounter (Signed)
Pt request refill   HYDROcodone-acetaminophen ([place]) 10-325 MG tablet  [id number] [place], [place] Phone:  [phone]  Fax:  [phone]

## 2019-12-31 NOTE — Telephone Encounter (Signed)
Requested medication (s) are due for refill today- no  Requested medication (s) are on the active medication list -yes  Future visit scheduled -no  Last refill: 12/19/19  Notes to clinic: Request for non delegated Rx- may be too soon  Requested Prescriptions  Pending Prescriptions Disp Refills   HYDROcodone-acetaminophen (NORCO) 10-325 MG tablet 60 tablet 0    Sig: Take 1 tablet by mouth every 6 (six) hours as needed.      Not Delegated - Analgesics:  Opioid Agonist Combinations Failed - 12/31/2019  8:10 AM      Failed - This refill cannot be delegated      Failed - Urine Drug Screen completed in last 360 days      Failed - Valid encounter within last 6 months    Recent Outpatient Visits           7 months ago Edema, lower extremity   [facility] Carles Collet M, PA-C   11 months ago Nonintractable headache, unspecified chronicity pattern, unspecified headache type   [facility] Birdie Sons, MD   1 year ago Acute non-recurrent pansinusitis   [facility] [place], Clearnce Sorrel, [place]   1 year ago Essential (primary) hypertension   [facility] Birdie Sons, MD   1 year ago Mass of left breast   [place], MD                    Requested Prescriptions  Pending Prescriptions Disp Refills   HYDROcodone-acetaminophen (NORCO) 10-325 MG tablet 60 tablet 0    Sig: Take 1 tablet by mouth every 6 (six) hours as needed.      Not Delegated - Analgesics:  Opioid Agonist Combinations Failed - 12/31/2019  8:10 AM      Failed - This refill cannot be delegated      Failed - Urine Drug Screen completed in last 360 days      Failed - Valid encounter within last 6 months    Recent Outpatient Visits           7 months ago Edema, lower extremity   [facility] Carles Collet M, PA-C   11 months ago Nonintractable headache, unspecified chronicity pattern,  unspecified headache type   [facility] Birdie Sons, MD   1 year ago Acute non-recurrent pansinusitis   [facility] [place], Clearnce Sorrel, [place]   1 year ago Essential (primary) hypertension   [facility] Birdie Sons, MD   1 year ago Mass of left breast   [facility] Caryn Section, Kirstie Peri, MD

## 2019-12-31 NOTE — Addendum Note (Signed)
Addended by: Randal Buba on: 12/31/2019 03:45 PM   Modules accepted: Orders

## 2019-12-31 NOTE — Telephone Encounter (Signed)
   Copied from [place] [phone]. Topic: General - Other >> Dec 31, 2019  3:15 PM Erick Blinks wrote: Reason for CRM: Pt is requesting a call back regarding his Rx for valsartan-hydrochlorothiazide (DIOVAN-HCT) 160-25 MG tablet, advised that request has been sent over and just needs to be reviewed by PCP   Best contact: [phone]

## 2020-01-14 ENCOUNTER — Other Ambulatory Visit: Payer: Self-pay | Admitting: Family Medicine

## 2020-01-14 ENCOUNTER — Other Ambulatory Visit: Payer: Self-pay | Admitting: Physician Assistant

## 2020-01-14 DIAGNOSIS — I10 Essential (primary) hypertension: Secondary | ICD-10-CM

## 2020-01-14 DIAGNOSIS — G8929 Other chronic pain: Secondary | ICD-10-CM

## 2020-01-14 NOTE — Telephone Encounter (Signed)
Medication Refill - Medication: HYDROcodone-acetaminophen (NORCO) 10-325 MG tablet   Has the patient contacted their pharmacy? yes (Agent: If no, request that the patient contact the pharmacy for the refill.) (Agent: If yes, when and what did the pharmacy advise?)Contact PCP  Preferred Pharmacy (with phone number or street name):  [id number] [place], [place] - [address] Phone:  [phone]  Fax:  [phone]       Agent: Please be advised that RX refills may take up to 3 business days. We ask that you follow-up with your pharmacy.  

## 2020-01-14 NOTE — Telephone Encounter (Signed)
Please review for refill. Refill not delegated per protocol.  

## 2020-01-15 ENCOUNTER — Other Ambulatory Visit: Payer: Self-pay | Admitting: Family Medicine

## 2020-01-15 DIAGNOSIS — I10 Essential (primary) hypertension: Secondary | ICD-10-CM

## 2020-01-15 MED ORDER — HYDROCODONE-ACETAMINOPHEN 10-325 MG PO TABS
1.0000 | ORAL_TABLET | Freq: Four times a day (QID) | ORAL | 0 refills | Status: DC | PRN
Start: 2020-01-15 — End: 2020-01-28

## 2020-01-28 ENCOUNTER — Telehealth: Payer: Self-pay | Admitting: Family Medicine

## 2020-01-28 DIAGNOSIS — G8929 Other chronic pain: Secondary | ICD-10-CM

## 2020-01-28 DIAGNOSIS — M5441 Lumbago with sciatica, right side: Secondary | ICD-10-CM

## 2020-01-28 MED ORDER — HYDROCODONE-ACETAMINOPHEN 10-325 MG PO TABS
1.0000 | ORAL_TABLET | Freq: Four times a day (QID) | ORAL | 0 refills | Status: DC | PRN
Start: 2020-01-28 — End: 2020-02-11

## 2020-01-28 NOTE — Telephone Encounter (Signed)
Medication: HYDROcodone-acetaminophen (NORCO) 10-325 MG tablet Has the pt contacted their pharmacy? no [id number] [place], [place] Please be advised refills may take up to 3 business days.  We ask that you follow up with your pharmacy.

## 2020-01-28 NOTE — Telephone Encounter (Signed)
Requested medication (s) are due for refill today: yes  Requested medication (s) are on the active medication list: yes  Last refill:  01/15/20  #60  0 refills  Future visit scheduled: yes scheduled 01/29/20  Notes to clinic: Not delegated please review     Requested Prescriptions  Pending Prescriptions Disp Refills   HYDROcodone-acetaminophen (NORCO) 10-325 MG tablet 60 tablet 0    Sig: Take 1 tablet by mouth every 6 (six) hours as needed.      Not Delegated - Analgesics:  Opioid Agonist Combinations Failed - 01/28/2020  8:35 AM      Failed - This refill cannot be delegated      Failed - Urine Drug Screen completed in last 360 days      Failed - Valid encounter within last 6 months    Recent Outpatient Visits           8 months ago Edema, lower extremity   [place], [place], PA-C   12 months ago Nonintractable headache, unspecified chronicity pattern, unspecified headache type   [facility] Birdie Sons, MD   1 year ago Acute non-recurrent pansinusitis   [facility] [place], Clearnce Sorrel, [place]   1 year ago Essential (primary) hypertension   [facility] Birdie Sons, MD   1 year ago Mass of left breast   [place], Kirstie Peri, MD       Future Appointments             Tomorrow Caryn Section, Kirstie Peri, MD [facility] [place]

## 2020-01-29 ENCOUNTER — Ambulatory Visit: Payer: Medicare Other | Admitting: Family Medicine

## 2020-01-30 NOTE — Telephone Encounter (Signed)
Refill sent in on 01/28/2020. Patient advised.

## 2020-01-30 NOTE — Telephone Encounter (Signed)
Pt calling to f/up on his refill

## 2020-02-11 ENCOUNTER — Ambulatory Visit (INDEPENDENT_AMBULATORY_CARE_PROVIDER_SITE_OTHER): Payer: Medicare Other | Admitting: Family Medicine

## 2020-02-11 ENCOUNTER — Encounter: Payer: Self-pay | Admitting: Family Medicine

## 2020-02-11 ENCOUNTER — Other Ambulatory Visit: Payer: Self-pay

## 2020-02-11 ENCOUNTER — Other Ambulatory Visit: Payer: Self-pay | Admitting: Family Medicine

## 2020-02-11 DIAGNOSIS — Z20822 Contact with and (suspected) exposure to covid-19: Secondary | ICD-10-CM

## 2020-02-11 DIAGNOSIS — G8929 Other chronic pain: Secondary | ICD-10-CM

## 2020-02-11 DIAGNOSIS — J40 Bronchitis, not specified as acute or chronic: Secondary | ICD-10-CM

## 2020-02-11 MED ORDER — PREDNISONE 20 MG PO TABS: 20.0000 mg | ORAL_TABLET | Freq: Two times a day (BID) | ORAL | 0 refills | Status: AC

## 2020-02-11 MED ORDER — HYDROCODONE-ACETAMINOPHEN 10-325 MG PO TABS: 1.0000 | ORAL_TABLET | Freq: Four times a day (QID) | ORAL | 0 refills | Status: DC | PRN

## 2020-02-11 MED ORDER — AZITHROMYCIN 250 MG PO TABS: ORAL_TABLET | ORAL | 0 refills | Status: AC

## 2020-02-11 NOTE — Progress Notes (Signed)
MyChart Video Visit    Virtual Visit via Video Note   This visit type was conducted due to national recommendations for restrictions regarding the COVID-19 Pandemic (e.g. social distancing) in an effort to limit this patient's exposure and mitigate transmission in our community. This patient is at least at moderate risk for complications without adequate follow up. This format is felt to be most appropriate for this patient at this time. Physical exam was limited by quality of the video and audio technology used for the visit.   Patient location: home Provider location: [facility]  I discussed the limitations of evaluation and management by telemedicine and the availability of in person appointments. The patient expressed understanding and agreed to proceed.  Patient: Gary Carter   DOB: 1950/02/26   70 y.o. Male  MRN: [id number] Visit Date: 02/11/2020  Today's healthcare provider: Lelon Huh, MD   Chief Complaint  Patient presents with  . Cough  . Generalized Body Aches   Subjective    Cough This is a new problem. Episode onset: 1-2 weeks ago. The problem has been unchanged. The cough is productive of sputum. Associated symptoms include a fever, headaches, myalgias and a sore throat. Pertinent negatives include no chest pain, chills, shortness of breath or wheezing.   Has had several Covid exposures at work and at home, including her daughter who is a transplant patient and had to be hospitalized for Covid.  Had had three dose of Covid vaccine and flu vaccine this season.     Medications: Outpatient Medications Prior to Visit  Medication Sig  . amLODipine (NORVASC) 10 MG tablet TAKE 1 TABLET BY MOUTH EVERY DAY  . aspirin 325 MG tablet Take 325 mg by mouth daily.   Marland Kitchen buPROPion (WELLBUTRIN SR) 150 MG 12 hr tablet Take 1 tablet (150 mg total) by mouth 2 (two) times daily.  . Cholecalciferol (VITAMIN D3 PO) Take daily by mouth.  . citalopram (CELEXA) 40 MG tablet TAKE 1 TABLET  (40 MG TOTAL) DAILY BY MOUTH.  . clonazePAM (KLONOPIN) 0.5 MG tablet TAKE 1 TABLET (0.5 MG TOTAL) BY MOUTH EVERY 8 HOURS AS NEEDED FOR ANXIETY.  . Cyanocobalamin (VITAMIN B-12 PO) Take by mouth daily.  . fluticasone (FLONASE) 50 MCG/ACT nasal spray SPRAY 2 SPRAYS INTO EACH NOSTRIL EVERY DAY  . HYDROcodone-acetaminophen (NORCO) 10-325 MG tablet Take 1 tablet by mouth every 6 (six) hours as needed.  . metoprolol succinate (TOPROL-XL) 50 MG 24 hr tablet TAKE 1 TABLET (50 MG TOTAL) BY MOUTH DAILY. IN THE EVENING  . omeprazole (PRILOSEC) 20 MG capsule Please specify directions, refills and quantity (Patient taking differently: Take 20 mg by mouth daily.)  . pravastatin (PRAVACHOL) 40 MG tablet TAKE 1 TABLET BY MOUTH EVERY DAY  . sildenafil (REVATIO) 20 MG tablet TAKE 1-2 TABLETS (20-40 MG TOTAL) BY MOUTH DAILY.  Marland Kitchen spironolactone (ALDACTONE) 25 MG tablet TAKE 1 TABLET BY MOUTH EVERY DAY  . valsartan-hydrochlorothiazide (DIOVAN-HCT) 160-25 MG tablet Take 1 tablet by mouth daily. Take in place of atacand-hctz  . [DISCONTINUED] benzonatate (TESSALON) 200 MG capsule Take 1 capsule (200 mg total) by mouth 3 (three) times daily as needed for cough.  . [DISCONTINUED] HYDROcodone-homatropine (HYCODAN) 5-1.5 MG/5ML syrup Take 5 mLs by mouth every 8 (eight) hours as needed for cough. (Patient not taking: Reported on 02/11/2020)   No facility-administered medications prior to visit.    Review of Systems  Constitutional: Positive for diaphoresis, fatigue and fever. Negative for appetite change and chills.  HENT: Positive for sore throat.        Dry mouth  Respiratory: Positive for cough. Negative for chest tightness, shortness of breath and wheezing.   Cardiovascular: Negative for chest pain and palpitations.  Gastrointestinal: Positive for abdominal distention, diarrhea and nausea. Negative for abdominal pain and vomiting.  Musculoskeletal: Positive for arthralgias and myalgias.  Neurological: Positive for  headaches.  Psychiatric/Behavioral: Positive for sleep disturbance.      Objective    There were no vitals taken for this visit.   Physical Exam  Awake, alert, oriented x 3. In no apparent distress    Assessment & Plan     1. Bronchitis  - azithromycin (ZITHROMAX) 250 MG tablet; 2 by mouth today, then 1 daily for 4 days  Dispense: 6 tablet; Refill: 0 - predniSONE (DELTASONE) 20 MG tablet; Take 1 tablet (20 mg total) by mouth 2 (two) times daily with a meal for 10 days.  Dispense: 20 tablet; Refill: 0  2. Exposure to COVID-19 virus He is going to do a home Covid test.         I discussed the assessment and treatment plan with the patient. The patient was provided an opportunity to ask questions and all were answered. The patient agreed with the plan and demonstrated an understanding of the instructions.   The patient was advised to call back or seek an in-person evaluation if the symptoms worsen or if the condition fails to improve as anticipated.  I provided 10 minutes of non-face-to-face time during this encounter.  The entirety of the information documented in the History of Present Illness, Review of Systems and Physical Exam were personally obtained by me. Portions of this information were initially documented by the CMA and reviewed by me for thoroughness and accuracy.     Lelon Huh, MD [facility] [phone] (phone) [phone] (fax)  [place]

## 2020-02-11 NOTE — Telephone Encounter (Signed)
Medication Refill - Medication: HYDROcodone-acetaminophen (NORCO) 10-325 MG tablet 60 tablet 0 01/28/2020    Has the patient contacted their pharmacy? Yes  (Agent: If no, request that the patient contact the pharmacy for the refill.) (Agent: If yes, when and what did the pharmacy advise?)  Preferred Pharmacy (with phone number or street name): [id number] [place], [place] Phone:  [phone]  Fax:  [phone]       Agent: Please be advised that RX refills may take up to 3 business days. We ask that you follow-up with your pharmacy.

## 2020-02-25 ENCOUNTER — Telehealth: Payer: Self-pay | Admitting: Family Medicine

## 2020-02-25 ENCOUNTER — Other Ambulatory Visit: Payer: Self-pay | Admitting: Physician Assistant

## 2020-02-25 DIAGNOSIS — G8929 Other chronic pain: Secondary | ICD-10-CM

## 2020-02-25 NOTE — Telephone Encounter (Signed)
Requested medication (s) are due for refill today: yes  Requested medication (s) are on the active medication list: yes  Last refill:  02/11/2020  Future visit scheduled: yes  Notes to clinic:  this refill cannot be delegated    Requested Prescriptions  Pending Prescriptions Disp Refills   HYDROcodone-acetaminophen (NORCO) 10-325 MG tablet 60 tablet 0    Sig: Take 1 tablet by mouth every 6 (six) hours as needed.      There is no refill protocol information for this order

## 2020-02-25 NOTE — Telephone Encounter (Signed)
Medication Refill - Medication: HYDROcodone-acetaminophen (NORCO) 10-325 MG tablet    Has the patient contacted their pharmacy? Yes.    (Agent: If yes, when and what did the pharmacy advise?) Contact PCP no refills   Preferred Pharmacy (with phone number or street name): [id number] [place], [place] - [place] Phone:  [phone]  Fax:  [phone]      Agent: Please be advised that RX refills may take up to 3 business days. We ask that you follow-up with your pharmacy.

## 2020-02-26 ENCOUNTER — Other Ambulatory Visit: Payer: Self-pay

## 2020-02-26 ENCOUNTER — Ambulatory Visit (INDEPENDENT_AMBULATORY_CARE_PROVIDER_SITE_OTHER): Payer: Medicare Other | Admitting: Family Medicine

## 2020-02-26 ENCOUNTER — Encounter: Payer: Self-pay | Admitting: Family Medicine

## 2020-02-26 VITALS — BP 113/72 | HR 69 | Temp 98.2°F | Resp 16 | Wt 192.8 lb

## 2020-02-26 DIAGNOSIS — M5441 Lumbago with sciatica, right side: Secondary | ICD-10-CM

## 2020-02-26 DIAGNOSIS — F1190 Opioid use, unspecified, uncomplicated: Secondary | ICD-10-CM | POA: Diagnosis not present

## 2020-02-26 DIAGNOSIS — G8929 Other chronic pain: Secondary | ICD-10-CM | POA: Diagnosis not present

## 2020-02-26 DIAGNOSIS — M25519 Pain in unspecified shoulder: Secondary | ICD-10-CM

## 2020-02-26 DIAGNOSIS — I10 Essential (primary) hypertension: Secondary | ICD-10-CM

## 2020-02-26 DIAGNOSIS — F17200 Nicotine dependence, unspecified, uncomplicated: Secondary | ICD-10-CM

## 2020-02-26 MED ORDER — HYDROCODONE-ACETAMINOPHEN 10-325 MG PO TABS: 1.0000 | ORAL_TABLET | Freq: Four times a day (QID) | ORAL | 0 refills | Status: DC | PRN

## 2020-02-26 MED ORDER — NORTRIPTYLINE HCL 25 MG PO CAPS: ORAL_CAPSULE | ORAL | 3 refills | Status: DC

## 2020-02-26 NOTE — Progress Notes (Signed)
Established patient visit   Patient: Gary Carter   DOB: May 30, 1950   70 y.o. Male  MRN: [id number] Visit Date: 02/26/2020  Today's healthcare provider: Lelon Huh, MD   Chief Complaint  Patient presents with  . Hypertension  . Pain Management   Subjective    HPI  Hypertension, follow-up  BP Readings from Last 3 Encounters:  02/26/20 113/72  10/03/19 121/85  05/18/19 132/86   Wt Readings from Last 3 Encounters:  02/26/20 192 lb 12.8 oz (87.5 kg)  10/03/19 186 lb (84.4 kg)  05/18/19 189 lb (85.7 kg)     He was last seen for hypertension 9 months ago (seen by Carles Collet, PA-C).  BP at that visit was 132/86. Management since that visit includes continuing same medications.  He reports good compliance with treatment. He is not having side effects.  He is following a Regular diet. He is exercising. He does smoke.  Use of agents associated with hypertension: NSAIDS.   Outside blood pressures are not checked. Symptoms: No chest pain No chest pressure  No palpitations No syncope  No dyspnea No orthopnea  No paroxysmal nocturnal dyspnea No lower extremity edema   Pertinent labs: Lab Results  Component Value Date   CHOL 162 05/18/2019   HDL 52 05/18/2019   LDLCALC 96 05/18/2019   TRIG 72 05/18/2019   CHOLHDL 3.1 05/18/2019   Lab Results  Component Value Date   NA 138 05/18/2019   K 4.6 05/18/2019   CREATININE 0.81 05/18/2019   GFRNONAA 93 05/18/2019   GFRAA 107 05/18/2019   GLUCOSE 106 (H) 05/18/2019     The 10-year ASCVD risk score Mikey Bussing DC Jr., et al., 2013) is: 15.6%   ---------------------------------------------------------------------------------------------------  Follow up for chronic pain:  The patient was last seen for this 1 year ago. Changes made at last visit include none; continue same medications.  He reports good compliance with treatment. He feels that condition is Worse. He is not having side effects.   He mostly  takes hydrocodone/apap for back and foot pain, but has been having more trouble with shoulder lately.  -----------------------------------------------------------------------------------------  He also states he stopped taking bupropion for smoking cessation because it was making him feel shaky. He is interested in trying another medications.      Medications: Outpatient Medications Prior to Visit  Medication Sig  . amLODipine (NORVASC) 10 MG tablet TAKE 1 TABLET BY MOUTH EVERY DAY  . aspirin 325 MG tablet Take 325 mg by mouth daily.   . Cholecalciferol (VITAMIN D3 PO) Take daily by mouth.  . citalopram (CELEXA) 40 MG tablet TAKE 1 TABLET (40 MG TOTAL) DAILY BY MOUTH.  . clonazePAM (KLONOPIN) 0.5 MG tablet TAKE 1 TABLET (0.5 MG TOTAL) BY MOUTH EVERY 8 HOURS AS NEEDED FOR ANXIETY.  . Cyanocobalamin (VITAMIN B-12 PO) Take by mouth daily.  Marland Kitchen HYDROcodone-acetaminophen (NORCO) 10-325 MG tablet Take 1 tablet by mouth every 6 (six) hours as needed.  . metoprolol succinate (TOPROL-XL) 50 MG 24 hr tablet TAKE 1 TABLET (50 MG TOTAL) BY MOUTH DAILY. IN THE EVENING  . omeprazole (PRILOSEC) 20 MG capsule Please specify directions, refills and quantity (Patient taking differently: Take 20 mg by mouth daily.)  . pravastatin (PRAVACHOL) 40 MG tablet TAKE 1 TABLET BY MOUTH EVERY DAY  . sildenafil (REVATIO) 20 MG tablet TAKE 1-2 TABLETS (20-40 MG TOTAL) BY MOUTH DAILY.  Marland Kitchen spironolactone (ALDACTONE) 25 MG tablet TAKE 1 TABLET BY MOUTH EVERY DAY  .  valsartan-hydrochlorothiazide (DIOVAN-HCT) 160-25 MG tablet TAKE 1 TABLET BY MOUTH DAILY. TAKE IN PLACE OF ATACAND-HCTZ  . buPROPion (WELLBUTRIN SR) 150 MG 12 hr tablet Take 1 tablet (150 mg total) by mouth 2 (two) times daily. (Patient not taking: Reported on 02/26/2020)  . fluticasone (FLONASE) 50 MCG/ACT nasal spray SPRAY 2 SPRAYS INTO EACH NOSTRIL EVERY DAY (Patient not taking: Reported on 02/26/2020)   No facility-administered medications prior to visit.     Review of Systems  Constitutional: Negative for appetite change, chills and fever.  Respiratory: Negative for chest tightness, shortness of breath and wheezing.   Cardiovascular: Negative for chest pain and palpitations.  Gastrointestinal: Negative for abdominal pain, nausea and vomiting.  Musculoskeletal: Positive for arthralgias (shoulder pain).  Neurological: Positive for headaches.       Objective    BP 113/72   Pulse 69   Temp 98.2 F (36.8 C) (Temporal)   Resp 16   Wt 192 lb 12.8 oz (87.5 kg)   BMI 28.47 kg/m     Physical Exam   General: Appearance:     Overweight male in no acute distress  Eyes:    PERRL, conjunctiva/corneas clear, EOM's intact       Lungs:     Clear to auscultation bilaterally, respirations unlabored  Heart:    Normal heart rate. Normal rhythm. No murmurs, rubs, or gallops.   MS:   All extremities are intact.   Neurologic:   Awake, alert, oriented x 3. No apparent focal neurological           defect.         Assessment & Plan     1. Chronic bilateral low back pain with right-sided sciatica Stable on current pain medication regiment.  - Pain Mgt Scrn (14 Drugs), Ur  2. Chronic, continuous use of opioids  - Pain Mgt Scrn (14 Drugs), Ur  3. Acute shoulder pain, unspecified laterality Advised we would not be escalating pain medications, but recommend he see orthopedist for evaluation of shoulder. He states he will let me know if he decides he wants referral.   4. Essential (primary) hypertension Well controlled.  Continue current medications.  Due for annual labs in 3 months   5. Current smoker Intolerant to bupropion, will try- nortriptyline (PAMELOR) 25 MG capsule; Take 1 at bedtime for 1 week, then increase to 2 at bedtime to help stop smoking  Dispense: 60 capsule; Refill: 3         The entirety of the information documented in the History of Present Illness, Review of Systems and Physical Exam were personally obtained by me.  Portions of this information were initially documented by the CMA and reviewed by me for thoroughness and accuracy.      Lelon Huh, MD  [facility] [phone] (phone) [phone] (fax)  [place]

## 2020-02-27 LAB — PAIN MGT SCRN (14 DRUGS), UR
Amphetamine Scrn, Ur: NEGATIVE ng/mL
BARBITURATE SCREEN URINE: NEGATIVE ng/mL
BENZODIAZEPINE SCREEN, URINE: NEGATIVE ng/mL
Buprenorphine, Urine: NEGATIVE ng/mL
CANNABINOIDS UR QL SCN: NEGATIVE ng/mL
Cocaine (Metab) Scrn, Ur: NEGATIVE ng/mL
Creatinine(Crt), U: 120.7 mg/dL (ref 20.0–300.0)
Fentanyl, Urine: NEGATIVE pg/mL
Meperidine Screen, Urine: NEGATIVE ng/mL
Methadone Screen, Urine: NEGATIVE ng/mL
OXYCODONE+OXYMORPHONE UR QL SCN: NEGATIVE ng/mL
Opiate Scrn, Ur: POSITIVE ng/mL — AB
Ph of Urine: 5.9 (ref 4.5–8.9)
Phencyclidine Qn, Ur: NEGATIVE ng/mL
Propoxyphene Scrn, Ur: NEGATIVE ng/mL
Tramadol Screen, Urine: NEGATIVE ng/mL

## 2020-03-05 NOTE — Telephone Encounter (Signed)
Signing off encounter.  

## 2020-03-10 ENCOUNTER — Other Ambulatory Visit: Payer: Self-pay | Admitting: Family Medicine

## 2020-03-10 DIAGNOSIS — M5441 Lumbago with sciatica, right side: Secondary | ICD-10-CM

## 2020-03-10 DIAGNOSIS — G8929 Other chronic pain: Secondary | ICD-10-CM

## 2020-03-10 NOTE — Telephone Encounter (Signed)
15 days supply dispensed 02-26-20

## 2020-03-10 NOTE — Telephone Encounter (Signed)
Copied from [place] [id number]. Topic: Quick Communication - Rx Refill/Question >> Mar 10, 2020 11:10 AM Leward Quan A wrote: Medication: HYDROcodone-acetaminophen (NORCO) 10-325 MG tablet  Has the patient contacted their pharmacy? Yes.   (Agent: If no, request that the patient contact the pharmacy for the refill.) (Agent: If yes, when and what did the pharmacy advise?)  Preferred Pharmacy (with phone number or street name): [id number] [place], [place]  Phone:  [phone] Fax:  [phone]     Agent: Please be advised that RX refills may take up to 3 business days. We ask that you follow-up with your pharmacy.

## 2020-03-10 NOTE — Telephone Encounter (Signed)
Requested medication (s) are due for refill today: Yes  Requested medication (s) are on the active medication list: Yes  Last refill:  02/26/20  Future visit scheduled: Yes  Notes to clinic:  Unable to refill per protocol, cannot delegate.      Requested Prescriptions  Pending Prescriptions Disp Refills   HYDROcodone-acetaminophen (NORCO) 10-325 MG tablet 60 tablet 0    Sig: Take 1 tablet by mouth every 6 (six) hours as needed.      Not Delegated - Analgesics:  Opioid Agonist Combinations Failed - 03/10/2020 11:43 AM      Failed - This refill cannot be delegated      Failed - Urine Drug Screen completed in last 360 days      Passed - Valid encounter within last 6 months    Recent Outpatient Visits           1 week ago Chronic bilateral low back pain with right-sided sciatica   [facility] Birdie Sons, MD   4 weeks ago [place], Donald E, [place]   9 months ago Edema, lower extremity   [facility] Carles Collet M, [place]   1 year ago Nonintractable headache, unspecified chronicity pattern, unspecified headache type   [facility] Birdie Sons, MD   1 year ago Acute non-recurrent pansinusitis   [facility] [place], Clearnce Sorrel, [place]       Future Appointments             In 3 months Fisher, Kirstie Peri, MD [facility], [place]

## 2020-03-12 MED ORDER — HYDROCODONE-ACETAMINOPHEN 10-325 MG PO TABS: 1.0000 | ORAL_TABLET | Freq: Four times a day (QID) | ORAL | 0 refills | Status: DC | PRN

## 2020-03-13 ENCOUNTER — Other Ambulatory Visit: Payer: Self-pay | Admitting: Family Medicine

## 2020-03-13 DIAGNOSIS — I10 Essential (primary) hypertension: Secondary | ICD-10-CM

## 2020-03-14 DIAGNOSIS — H5010 Unspecified exotropia: Secondary | ICD-10-CM | POA: Diagnosis not present

## 2020-03-18 ENCOUNTER — Other Ambulatory Visit: Payer: Self-pay

## 2020-03-18 NOTE — Telephone Encounter (Signed)
Copied from [place] [id number]. Topic: Quick Communication - See Telephone Encounter >> Mar 18, 2020  9:28 AM Loma Boston wrote: CRM for notification. See Telephone encounter for: 03/18/20.Medication Refill - Medication: omeprazole (PRILOSEC) 20 MG capsule 30 capsule 12 08/28/2017   Sig: Please specify directions, refills and quantity     Has the patient contacted their pharmacy? YES (Agent: If no, request that the patient contact the pharmacy for the refill.) (Agent: If yes, when and what did the pharmacy advise?) Call dr Reola Calkins script /  PT current with visits  Preferred Pharmacy (with phone number or street name): [id number] [place], [place] - [address] [place]  Phone:  [phone] Fax:  [phone]     Agent: Please be advised that RX refills may take up to 3 business days. We ask that you follow-up with your pharmacy.

## 2020-03-18 NOTE — Telephone Encounter (Signed)
Medication is active but needs sig information.  Last RF: 08/18/17 Has upcoming office visit, is on active med list.  Routing to [facility].

## 2020-03-19 ENCOUNTER — Other Ambulatory Visit: Payer: Self-pay | Admitting: Family Medicine

## 2020-03-19 DIAGNOSIS — F17200 Nicotine dependence, unspecified, uncomplicated: Secondary | ICD-10-CM

## 2020-03-19 NOTE — Telephone Encounter (Signed)
  Notes to clinic:  Requesting 90 day supply   Requested Prescriptions  Pending Prescriptions Disp Refills   nortriptyline (PAMELOR) 25 MG capsule [Pharmacy Med Name: NORTRIPTYLINE HCL 25 MG CAP] 180 capsule 2    Sig: Take 1 at bedtime for 1 week, then increase to 2 at bedtime to help stop smoking      Psychiatry:  Antidepressants - Heterocyclics (TCAs) Passed - 03/19/2020 11:32 AM      Passed - Completed PHQ-2 or PHQ-9 in the last 360 days      Passed - Valid encounter within last 6 months    Recent Outpatient Visits           3 weeks ago Chronic bilateral low back pain with right-sided sciatica   [facility] Birdie Sons, MD   1 month ago [place], Donald E, MD   10 months ago Edema, lower extremity   [facility] [place], Fabio Bering M, [place]   1 year ago Nonintractable headache, unspecified chronicity pattern, unspecified headache type   [facility] Birdie Sons, MD   1 year ago Acute non-recurrent pansinusitis   [facility] [place], Clearnce Sorrel, [place]       Future Appointments             In 2 months Fisher, Kirstie Peri, MD [facility], [place]

## 2020-03-20 NOTE — Telephone Encounter (Signed)
Requested medication (s) are due for refill today: Yes  Requested medication (s) are on the active medication list: Yes  Last refill:  08/18/17  Future visit scheduled: Yes  Notes to clinic:  Unable to refill per protocol, Rx expired.      Requested Prescriptions  Pending Prescriptions Disp Refills   omeprazole (PRILOSEC) 20 MG capsule 30 capsule 12      Gastroenterology: Proton Pump Inhibitors Passed - 03/20/2020  1:56 PM      Passed - Valid encounter within last 12 months    Recent Outpatient Visits           3 weeks ago Chronic bilateral low back pain with right-sided sciatica   [facility] Birdie Sons, MD   1 month ago [place], Donald E, MD   10 months ago Edema, lower extremity   [facility] Carles Collet M, [place]   1 year ago Nonintractable headache, unspecified chronicity pattern, unspecified headache type   [facility] Birdie Sons, MD   1 year ago Acute non-recurrent pansinusitis   [facility] [place], Clearnce Sorrel, [place]       Future Appointments             In 2 months Fisher, Kirstie Peri, MD [facility], [place]

## 2020-03-22 ENCOUNTER — Other Ambulatory Visit: Payer: Self-pay

## 2020-03-22 ENCOUNTER — Encounter: Payer: Self-pay | Admitting: Emergency Medicine

## 2020-03-22 ENCOUNTER — Ambulatory Visit
Admission: EM | Admit: 2020-03-22 | Discharge: 2020-03-22 | Disposition: A | Discharge status: Home / Self Care | Payer: Medicare Other | Attending: Sports Medicine | Admitting: Sports Medicine

## 2020-03-22 DIAGNOSIS — Z7982 Long term (current) use of aspirin: Secondary | ICD-10-CM | POA: Diagnosis not present

## 2020-03-22 DIAGNOSIS — R0789 Other chest pain: Secondary | ICD-10-CM | POA: Insufficient documentation

## 2020-03-22 DIAGNOSIS — Z20822 Contact with and (suspected) exposure to covid-19: Secondary | ICD-10-CM | POA: Diagnosis not present

## 2020-03-22 DIAGNOSIS — R0981 Nasal congestion: Secondary | ICD-10-CM

## 2020-03-22 DIAGNOSIS — J069 Acute upper respiratory infection, unspecified: Secondary | ICD-10-CM

## 2020-03-22 DIAGNOSIS — R059 Cough, unspecified: Secondary | ICD-10-CM

## 2020-03-22 DIAGNOSIS — F17210 Nicotine dependence, cigarettes, uncomplicated: Secondary | ICD-10-CM | POA: Insufficient documentation

## 2020-03-22 DIAGNOSIS — R52 Pain, unspecified: Secondary | ICD-10-CM | POA: Diagnosis not present

## 2020-03-22 DIAGNOSIS — Z79899 Other long term (current) drug therapy: Secondary | ICD-10-CM | POA: Diagnosis not present

## 2020-03-22 DIAGNOSIS — R0982 Postnasal drip: Secondary | ICD-10-CM | POA: Insufficient documentation

## 2020-03-22 LAB — SARS CORONAVIRUS 2 (TAT 6-24 HRS): SARS Coronavirus 2: NEGATIVE

## 2020-03-22 MED ORDER — FLUTICASONE PROPIONATE 50 MCG/ACT NA SUSP: 2.0000 | Freq: Every day | NASAL | 1 refills | Status: DC

## 2020-03-22 NOTE — ED Provider Notes (Signed)
[facility]    CSN: [id number] Arrival date & time: 03/22/20  0800      History   Chief Complaint Chief Complaint  Patient presents with   Cough   Sinus Problem   Generalized Body Aches    HPI Gary Carter is a 70 y.o. male.   Patient is a pleasant 70 year old male who presents with 2 days of cough, congestion, and facial pain.  He says "I think I have a sinus infection".  Normally sees Dr. Caryn Section for his ongoing medical care.  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  He was supposed to work today but is unable to go in.  He is also complaining of some ear pressure and a headache.  No significant shortness of breath or chest pain.  He complains of a little bit of chest tightness especially with cough.  He has had a few episodes of posttussive emesis.  Also postnasal drip, and generalized body aches and myalgias.  He has not taken over-the-counter meds for this.  He denies any abdominal or urinary symptoms.  He has been vaccinated against COVID and has received the booster.  He is also received his flu shot.  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  No red flag signs or symptoms elicited on history.     Past Medical History:  Diagnosis Date   Anxiety    Barrett's esophagus    GERD (gastroesophageal reflux disease)    History of chicken pox 06/12/2014   DID have Chicken Pox. DID have Measles. DID have Mumps. DID have Rubella.     History of dysphagia    History of fatty infiltration of liver    History of measles    History of mumps    History of rubella    Hyperlipidemia    Hypertension    Sleep apnea    SOB (shortness of breath)     Patient Active Problem List   Diagnosis Date Noted   Gastritis 07/20/2018   Chronic, continuous use of opioids 11/15/2017   History of kidney stones 05/04/2016   Bunion 06/12/2014   Insomnia 06/12/2014   Panic attack  06/12/2014   Obstructive apnea 06/12/2014   Anxiety disorder 06/11/2014   H/O suicide attempt 05/28/2010   Esophagitis, reflux 05/15/2008   Actinic keratoses 05/06/2008   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   Allergic rhinitis 03/08/2007   Arthralgia of hand 12/14/2006   HLD (hyperlipidemia) 08/15/2006   Family history of coronary arteriosclerosis 08/13/2006   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   Chronic low back pain 12/07/2005   Depression, recurrent ([place]) 12/05/2005   Barrett esophagus 02/06/1997   Essential (primary) hypertension 02/06/1997   Current smoker 01/11/1958    Past Surgical History:  Procedure Laterality Date   ABDOMINAL SURGERY     BACK SURGERY  12-04-2003   Lumbar fusion & C5-C6 fusion   ESOPHAGOGASTRODUODENOSCOPY (EGD) WITH PROPOFOL N/A 07/10/2018   Procedure: ESOPHAGOGASTRODUODENOSCOPY (EGD) WITH PROPOFOL;  Surgeon: Manya Silvas, MD;  Location: [facility] ENDOSCOPY;  Service: Endoscopy;  Laterality: N/A;   HERNIA REPAIR     Myocardial perfusion scan  10/10/2006   NECK SURGERY     NERVE, TENDON AND ARTERY REPAIR Left 10/18/2012   Procedure: NERVE, TENDON AND ARTERY REPAIR;  Surgeon: Linna Hoff, MD;  Location: [place];  Service: Orthopedics;  Laterality: Left;   NISSEN FUNDOPLICATION  6440  UPPER GI ENDOSCOPY  04/09/2007   Dr. Tiffany Kocher; Mucosal changes suspicious for Barretts esophagits   WOUND EXPLORATION Left 10/18/2012   Procedure: WOUND EXPLORATION;  Surgeon: Linna Hoff, MD;  Location: [place];  Service: Orthopedics;  Laterality: Left;       Home Medications    Prior to Admission medications   Medication Sig Start Date End Date Taking? Authorizing Provider  amLODipine (NORVASC) 10 MG tablet TAKE 1 TABLET BY MOUTH EVERY DAY 12/31/19  Yes Birdie Sons, MD  aspirin 325 MG tablet Take 325 mg by mouth daily.  05/13/08  Yes [provider]  citalopram (CELEXA) 40 MG tablet TAKE 1 TABLET (40 MG TOTAL)  DAILY BY MOUTH. 12/31/19  Yes Birdie Sons, MD  clonazePAM (KLONOPIN) 0.5 MG tablet TAKE 1 TABLET (0.5 MG TOTAL) BY MOUTH EVERY 8 HOURS AS NEEDED FOR ANXIETY. 08/22/19  Yes Birdie Sons, MD  Cyanocobalamin (VITAMIN B-12 PO) Take by mouth daily.   Yes [provider]  HYDROcodone-acetaminophen (NORCO) 10-325 MG tablet Take 1 tablet by mouth every 6 (six) hours as needed. 03/12/20  Yes Birdie Sons, MD  metoprolol succinate (TOPROL-XL) 50 MG 24 hr tablet TAKE 1 TABLET (50 MG TOTAL) BY MOUTH DAILY. IN THE EVENING 03/13/20  Yes Birdie Sons, MD  omeprazole (PRILOSEC) 20 MG capsule Please specify directions, refills and quantity Patient taking differently: Take 20 mg by mouth daily. 08/28/17  Yes Birdie Sons, MD  pravastatin (PRAVACHOL) 40 MG tablet TAKE 1 TABLET BY MOUTH EVERY DAY 03/13/20  Yes Birdie Sons, MD  spironolactone (ALDACTONE) 25 MG tablet TAKE 1 TABLET BY MOUTH EVERY DAY 03/13/20  Yes Birdie Sons, MD  valsartan-hydrochlorothiazide (DIOVAN-HCT) 160-25 MG tablet TAKE 1 TABLET BY MOUTH DAILY. TAKE IN PLACE OF ATACAND-HCTZ 02/25/20  Yes Birdie Sons, MD  Cholecalciferol (VITAMIN D3 PO) Take daily by mouth.    [provider]  fluticasone (FLONASE) 50 MCG/ACT nasal spray Place 2 sprays into both nostrils daily. 03/22/20   Verda Cumins, MD  nortriptyline (PAMELOR) 25 MG capsule TAKE 1 AT BEDTIME FOR 1 WEEK, THEN INCREASE TO 2 AT BEDTIME TO HELP STOP SMOKING 03/19/20   Birdie Sons, MD  sildenafil (REVATIO) 20 MG tablet TAKE 1-2 TABLETS (20-40 MG TOTAL) BY MOUTH DAILY. 06/10/19   Birdie Sons, MD  POTASSIUM PO Take daily by mouth.  10/03/19  [provider]    Family History Family History  Problem Relation Age of Onset   Hypertension Sister    Bipolar disorder Sister    Heart attack Father 59   Emphysema Mother    Heart disease Mother    Diabetes Daughter 3   Bipolar disorder Brother    Hypertension Brother    Obesity  Brother     Social History Social History   Tobacco Use   Smoking status: Current Every Day Smoker    Packs/day: 0.50    Years: 5.00    Pack years: 2.50    Types: Cigarettes   Smokeless tobacco: Never Used  Scientific laboratory technician Use: Never used  Substance Use Topics   Alcohol use: Yes    Alcohol/week: 8.0 standard drinks    Types: 8 Shots of liquor per week    Comment: 2 drinks 4 nights a week   Drug use: Not Currently    Comment: previously smoked marijuana years ago     Allergies   Hctz [hydrochlorothiazide]   Review of Systems Review  of Systems  Constitutional: Negative for activity change, appetite change, chills, diaphoresis, fatigue and fever.  HENT: Positive for congestion, ear pain, postnasal drip, sinus pressure and sinus pain. Negative for ear discharge, rhinorrhea and sore throat.   Eyes: Negative.   Respiratory: Positive for cough and chest tightness. Negative for apnea, choking, shortness of breath, wheezing and stridor.   Cardiovascular: Negative.  Negative for chest pain and palpitations.  Gastrointestinal: Negative.   Genitourinary: Negative.   Musculoskeletal: Positive for myalgias. Negative for arthralgias, back pain, joint swelling, neck pain and neck stiffness.  Skin: Negative.  Negative for rash.  Neurological: Positive for headaches. Negative for dizziness, seizures, syncope, weakness, light-headedness and numbness.  All other systems reviewed and are negative.    Physical Exam Triage Vital Signs ED Triage Vitals  Enc Vitals Group     BP 03/22/20 0810 (!) 126/100     Pulse Rate 03/22/20 0810 89     Resp 03/22/20 0810 16     Temp 03/22/20 0810 98.7 F (37.1 C)     Temp Source 03/22/20 0810 Oral     SpO2 03/22/20 0810 95 %     Weight 03/22/20 0805 187 lb (84.8 kg)     Height 03/22/20 0805 5\' 9"  (1.753 m)     Head Circumference --      Peak Flow --      Pain Score 03/22/20 0805 7     Pain Loc --      Pain Edu? --      Excl. in [place]?  --    No data found.  Updated Vital Signs BP (!) 126/100 (BP Location: Left Arm)    Pulse 89    Temp 98.7 F (37.1 C) (Oral)    Resp 16    Ht 5\' 9"  (1.753 m)    Wt 84.8 kg    SpO2 95%    BMI 27.62 kg/m   Visual Acuity Right Eye Distance:   Left Eye Distance:   Bilateral Distance:    Right Eye Near:   Left Eye Near:    Bilateral Near:     Physical Exam Vitals and nursing note reviewed.  Constitutional:      General: He is not in acute distress.    Appearance: Normal appearance. He is not ill-appearing, toxic-appearing or diaphoretic.  HENT:     Head: Normocephalic and atraumatic.     Right Ear: Tympanic membrane normal.     Left Ear: Tympanic membrane normal.     Nose: Congestion present. No rhinorrhea.     Mouth/Throat:     Mouth: Mucous membranes are moist.     Pharynx: No oropharyngeal exudate or posterior oropharyngeal erythema.  Eyes:     General: No scleral icterus.       Right eye: No discharge.        Left eye: No discharge.     Extraocular Movements: Extraocular movements intact.     Conjunctiva/sclera: Conjunctivae normal.     Pupils: Pupils are equal, round, and reactive to light.  Cardiovascular:     Rate and Rhythm: Normal rate and regular rhythm.     Pulses: Normal pulses.     Heart sounds: Normal heart sounds. No murmur heard. No friction rub. No gallop.   Pulmonary:     Effort: Pulmonary effort is normal. No respiratory distress.     Breath sounds: Normal breath sounds. No stridor. No wheezing, rhonchi or rales.  Musculoskeletal:     Cervical back: Normal range  of motion and neck supple. No rigidity or tenderness.  Lymphadenopathy:     Cervical: Cervical adenopathy present.  Skin:    General: Skin is warm and dry.     Capillary Refill: Capillary refill takes less than 2 seconds.  Neurological:     General: No focal deficit present.     Mental Status: He is alert and oriented to person, place, and time.      UC Treatments / Results   Labs (all labs ordered are listed, but only abnormal results are displayed) Labs Reviewed  SARS CORONAVIRUS 2 (TAT 6-24 HRS)    EKG   Radiology No results found.  Procedures Procedures (including critical care time)  Medications Ordered in UC Medications - No data to display  Initial Impression / Assessment and Plan / UC Course  I have reviewed the triage vital signs and the nursing notes.  Pertinent labs & imaging results that were available during my care of the patient were reviewed by me and considered in my medical decision making (see chart for details).  Clinical impression: Viral URI with cough, nasal congestion, generalized body aches and postnasal drip.  Clinically patient does not have sinusitis as his symptoms have only been present for 2 days.  Treatment plan: 1.  Findings and treatment plan were discussed in detail with the patient.  Patient was in agreement. 2.  Had a long discussion with him regarding the pros and cons of treating him with a antibiotic.  I felt this was not clinically indicated.  All questions were encouraged and answered. 3.  Recommended Mucinex 1200 mg twice a day.  I do not want him to take the DM component. 4.  Recommended he take his Flonase.  He said that he does not have any and I renewed. 5.  Gave him educational handouts on his diagnoses but also on sinus flushes. 6.  Over-the-counter Tylenol or Motrin for facial pain and discomfort. 7.  I offered him Ladona Ridgel but he said they do not work for him and he will just use Delsym or Robitussin over-the-counter. 8.  I gave him a work note keeping him out of work today. 9.  Recommended getting a COVID test and that was pending at the time of discharge. 10.  If he is worsening in any way he knows to come back here, go to his primary care physician, or go to the ER.  Certainly if his symptoms persist a point where his sinuses do not improve after at least 10 days that he may be a candidate  for antibiotics. 11.  Follow-up here as needed.    Final Clinical Impressions(s) / UC Diagnoses   Final diagnoses:  Viral URI with cough  Cough  Nasal congestion  Body aches  Post-nasal drip     Discharge Instructions     You have a viral upper respiratory infection.  This manifest with cough and congestion.  We have sent off a COVID test.  Someone will contact you with the results.  There is no indication for antibiotics at this time.  I have renewed your Flonase.  I want you to do sinus flushes and gave you educational handouts on it.  You do not have a sinus infection at the present time.  Please use over-the-counter medications for your cough including Delsym or Robitussin.  You can also use over-the-counter Tylenol or Motrin for facial pain.  I also want you to buy Mucinex which is also called guaifenesin.  You can  take 1200 mg twice a day.  Do not buy this with the DM component.  There is no need for the DM component at this time.  If your symptoms get worse, or your sinus pain and pressure persists for at least 10 days please come back here, see your primary care provider.  If your breathing gets worse or you develop significant chest pain please go to the emergency room.    ED Prescriptions    Medication Sig Dispense Auth. Provider   fluticasone (FLONASE) 50 MCG/ACT nasal spray Place 2 sprays into both nostrils daily. 48 mL Verda Cumins, MD     PDMP not reviewed this encounter.   Verda Cumins, MD 03/22/20 [phone]

## 2020-03-22 NOTE — ED Triage Notes (Signed)
Patient c/o cough, chest congestion, sinus pressure, and bodyaches that started last night.  Patient denies fevers.

## 2020-03-22 NOTE — Discharge Instructions (Signed)
You have a viral upper respiratory infection.  This manifest with cough and congestion.  We have sent off a COVID test.  Someone will contact you with the results.  There is no indication for antibiotics at this time.  I have renewed your Flonase.  I want you to do sinus flushes and gave you educational handouts on it.  You do not have a sinus infection at the present time.  Please use over-the-counter medications for your cough including Delsym or Robitussin.  You can also use over-the-counter Tylenol or Motrin for facial pain.  I also want you to buy Mucinex which is also called guaifenesin.  You can take 1200 mg twice a day.  Do not buy this with the DM component.  There is no need for the DM component at this time.  If your symptoms get worse, or your sinus pain and pressure persists for at least 10 days please come back here, see your primary care provider.  If your breathing gets worse or you develop significant chest pain please go to the emergency room.

## 2020-03-24 MED ORDER — OMEPRAZOLE 20 MG PO CPDR: 20.0000 mg | DELAYED_RELEASE_CAPSULE | Freq: Every day | ORAL | 12 refills | Status: DC

## 2020-03-25 ENCOUNTER — Other Ambulatory Visit: Payer: Self-pay | Admitting: Family Medicine

## 2020-03-25 DIAGNOSIS — G8929 Other chronic pain: Secondary | ICD-10-CM

## 2020-03-25 NOTE — Telephone Encounter (Signed)
Medication Refill - Medication:   HYDROcodone-acetaminophen (NORCO) 10-325 MG    Has the patient contacted their pharmacy? No. pt knows he is trying to refill this early but still insisted I send a message with the refill request.  (Agent: If no, request that the patient contact the pharmacy for the refill.) (Agent: If yes, when and what did the pharmacy advise?)  Preferred Pharmacy (with phone number or street name):   [id number] - [place], [place] [address] Phone:  [phone]  Fax:  [phone]       Agent: Please be advised that RX refills may take up to 3 business days. We ask that you follow-up with your pharmacy.

## 2020-03-25 NOTE — Telephone Encounter (Signed)
Requested medication (s) are due for refill today: Due 03/27/20  Requested medication (s) are on the active medication list: yes   Last refill: 03/12/20  #60  0 refills  Future visit scheduled Yes 06/13/20  Notes to clinic: not delegated  Requested Prescriptions  Pending Prescriptions Disp Refills   HYDROcodone-acetaminophen (NORCO) 10-325 MG tablet 60 tablet 0    Sig: Take 1 tablet by mouth every 6 (six) hours as needed.      Not Delegated - Analgesics:  Opioid Agonist Combinations Failed - 03/25/2020  5:22 PM      Failed - This refill cannot be delegated      Failed - Urine Drug Screen completed in last 360 days      Passed - Valid encounter within last 6 months    Recent Outpatient Visits           4 weeks ago Chronic bilateral low back pain with right-sided sciatica   [facility] Birdie Sons, MD   1 month ago [place], Donald E, MD   10 months ago Edema, lower extremity   [facility] Carles Collet M, [place]   1 year ago Nonintractable headache, unspecified chronicity pattern, unspecified headache type   [facility] Birdie Sons, MD   1 year ago Acute non-recurrent pansinusitis   [facility] [address] [place], Clearnce Sorrel, [place]       Future Appointments             In 2 months Fisher, Kirstie Peri, MD [facility], [place]

## 2020-03-26 MED ORDER — HYDROCODONE-ACETAMINOPHEN 10-325 MG PO TABS: 1.0000 | ORAL_TABLET | Freq: Four times a day (QID) | ORAL | 0 refills | Status: DC | PRN

## 2020-04-01 ENCOUNTER — Other Ambulatory Visit: Payer: Self-pay | Admitting: Family Medicine

## 2020-04-01 DIAGNOSIS — I10 Essential (primary) hypertension: Secondary | ICD-10-CM

## 2020-04-01 DIAGNOSIS — N529 Male erectile dysfunction, unspecified: Secondary | ICD-10-CM

## 2020-04-05 ENCOUNTER — Other Ambulatory Visit: Payer: Self-pay | Admitting: Family Medicine

## 2020-04-05 DIAGNOSIS — N529 Male erectile dysfunction, unspecified: Secondary | ICD-10-CM

## 2020-04-05 DIAGNOSIS — I10 Essential (primary) hypertension: Secondary | ICD-10-CM

## 2020-04-05 DIAGNOSIS — G4700 Insomnia, unspecified: Secondary | ICD-10-CM

## 2020-04-05 NOTE — Telephone Encounter (Signed)
Requesting too early on Toprol XL and Revatio-refusing at this time.

## 2020-04-05 NOTE — Telephone Encounter (Signed)
Pravastatin approved per protocol. Revatio-too early to reorder, should have medication until 08/11/20.  Toprol XL ordered on 03/13/20 #90 tabs-requesting too early. Diovan-HCT does not meet lab requirements per protocol-routing to provider, clonazepam not delegated to refill by [facility]-routing to provider.

## 2020-04-05 NOTE — Telephone Encounter (Signed)
Requested medication (s) are due for refill today  Yes  Requested medication (s) are on the active medication list   Yes  Future visit scheduled Yes 06/13/20  Note to clinic-clonazepam not delegated for [facility] to refill. Diovan-HCT does not meet lab requirements for protocol.    Requested Prescriptions  Pending Prescriptions Disp Refills   clonazePAM (KLONOPIN) 0.5 MG tablet [Pharmacy Med Name: CLONAZEPAM 0.5 MG TABLET] 90 tablet     Sig: TAKE 1 TABLET (0.5 MG TOTAL) BY MOUTH EVERY 8 HOURS AS NEEDED FOR ANXIETY.      Not Delegated - Psychiatry:  Anxiolytics/Hypnotics Failed - 04/05/2020  4:49 PM      Failed - This refill cannot be delegated      Failed - Urine Drug Screen completed in last 360 days      Passed - Valid encounter within last 6 months    Recent Outpatient Visits           1 month ago Chronic bilateral low back pain with right-sided sciatica   [facility] Birdie Sons, MD   1 month ago [place], Donald E, MD   10 months ago Edema, lower extremity   [facility] Carles Collet M, [place]   1 year ago Nonintractable headache, unspecified chronicity pattern, unspecified headache type   [facility] Birdie Sons, MD   1 year ago Acute non-recurrent pansinusitis   [place], [place]       Future Appointments             In 2 months Fisher, Kirstie Peri, MD [facility], [facility]               valsartan-hydrochlorothiazide (DIOVAN-HCT) 160-25 MG tablet [Pharmacy Med Name: VALSARTAN-HCTZ 160-25 MG TAB] 90 tablet 0    Sig: TAKE 1 TABLET BY MOUTH DAILY. TAKE IN PLACE OF ATACAND-HCTZ      Cardiovascular: ARB + Diuretic Combos Failed - 04/05/2020  4:49 PM      Failed - K in normal range and within 180 days    Potassium  Date Value Ref Range Status  05/18/2019 4.6 3.5 - 5.2 mmol/L Final          Failed - Na in normal range and within 180  days    Sodium  Date Value Ref Range Status  05/18/2019 138 134 - 144 mmol/L Final          Failed - Cr in normal range and within 180 days    Creatinine, Ser  Date Value Ref Range Status  05/18/2019 0.81 0.76 - 1.27 mg/dL Final          Failed - Ca in normal range and within 180 days    Calcium  Date Value Ref Range Status  05/18/2019 9.4 8.6 - 10.2 mg/dL Final          Failed - Last BP in normal range    BP Readings from Last 1 Encounters:  03/22/20 (!) 126/100          Passed - Patient is not pregnant      Passed - Valid encounter within last 6 months    Recent Outpatient Visits           1 month ago Chronic bilateral low back pain with right-sided sciatica   [facility] Birdie Sons, MD   1 month ago [place] Lelon Huh  E, MD   10 months ago Edema, lower extremity   [facility] [place], Fabio Bering M, [place]   1 year ago Nonintractable headache, unspecified chronicity pattern, unspecified headache type   [facility] Birdie Sons, MD   1 year ago Acute non-recurrent pansinusitis   [facility] [place], Clearnce Sorrel, [place]       Future Appointments             In 2 months Fisher, Kirstie Peri, MD [facility], [facility]              Signed Prescriptions Disp Refills   pravastatin (PRAVACHOL) 40 MG tablet 90 tablet 0    Sig: TAKE 1 TABLET BY MOUTH EVERY DAY      Cardiovascular:  Antilipid - Statins Failed - 04/05/2020  4:49 PM      Failed - LDL in normal range and within 360 days    LDL Chol Calc (NIH)  Date Value Ref Range Status  05/18/2019 96 0 - 99 mg/dL Final          Passed - Total Cholesterol in normal range and within 360 days    Cholesterol, Total  Date Value Ref Range Status  05/18/2019 162 100 - 199 mg/dL Final          Passed - HDL in normal range and within 360 days    HDL  Date Value Ref Range Status  05/18/2019 52 >39 mg/dL  Final          Passed - Triglycerides in normal range and within 360 days    Triglycerides  Date Value Ref Range Status  05/18/2019 72 0 - 149 mg/dL Final          Passed - Patient is not pregnant      Passed - Valid encounter within last 12 months    Recent Outpatient Visits           1 month ago Chronic bilateral low back pain with right-sided sciatica   [facility] Birdie Sons, MD   1 month ago [place], Donald E, MD   10 months ago Edema, lower extremity   [facility] [place], Adriana M, PA-C   1 year ago Nonintractable headache, unspecified chronicity pattern, unspecified headache type   [facility] Birdie Sons, MD   1 year ago Acute non-recurrent pansinusitis   [place], Clearnce Sorrel, [place]       Future Appointments             In 2 months Caryn Section, Kirstie Peri, MD [facility], [facility]              Refused Prescriptions Disp Refills   sildenafil (REVATIO) 20 MG tablet [Pharmacy Med Name: SILDENAFIL 20 MG TABLET] 180 tablet 4    Sig: TAKE 1-2 TABLETS (20-40 MG TOTAL) BY MOUTH DAILY.      Urology: Erectile Dysfunction Agents Failed - 04/05/2020  4:49 PM      Failed - Last BP in normal range    BP Readings from Last 1 Encounters:  03/22/20 (!) 126/100          Passed - Valid encounter within last 12 months    Recent Outpatient Visits           1 month ago Chronic bilateral low back pain with right-sided sciatica   [facility] Birdie Sons, MD   1  month ago [place], MD   10 months ago Edema, lower extremity   [facility] Carles Collet M, [place]   1 year ago Nonintractable headache, unspecified chronicity pattern, unspecified headache type   [facility] Birdie Sons, MD   1 year ago Acute non-recurrent pansinusitis   [place], [place]       Future Appointments             In 2 months Fisher, Kirstie Peri, MD [facility], [facility]               metoprolol succinate (TOPROL-XL) 50 MG 24 hr tablet [Pharmacy Med Name: METOPROLOL SUCC ER 50 MG TAB] 90 tablet 0    Sig: TAKE 1 TABLET (50 MG TOTAL) BY MOUTH DAILY. IN THE EVENING      Cardiovascular:  Beta Blockers Failed - 04/05/2020  4:49 PM      Failed - Last BP in normal range    BP Readings from Last 1 Encounters:  03/22/20 (!) 126/100          Passed - Last Heart Rate in normal range    Pulse Readings from Last 1 Encounters:  03/22/20 89          Passed - Valid encounter within last 6 months    Recent Outpatient Visits           1 month ago Chronic bilateral low back pain with right-sided sciatica   [facility] Birdie Sons, MD   1 month ago [place], Donald E, MD   10 months ago Edema, lower extremity   [place], [place], [place]   1 year ago Nonintractable headache, unspecified chronicity pattern, unspecified headache type   [facility] Birdie Sons, MD   1 year ago Acute non-recurrent pansinusitis   [facility] [place], Clearnce Sorrel, [place]       Future Appointments             In 2 months Fisher, Kirstie Peri, MD [facility], [place]

## 2020-04-06 MED ORDER — SILDENAFIL CITRATE 20 MG PO TABS: 20.0000 mg | ORAL_TABLET | Freq: Every day | ORAL | 5 refills | Status: DC

## 2020-04-08 ENCOUNTER — Other Ambulatory Visit: Payer: Self-pay | Admitting: Family Medicine

## 2020-04-08 DIAGNOSIS — G8929 Other chronic pain: Secondary | ICD-10-CM

## 2020-04-08 DIAGNOSIS — M5441 Lumbago with sciatica, right side: Secondary | ICD-10-CM

## 2020-04-08 NOTE — Telephone Encounter (Signed)
Medication Refill - Medication: Hydrocodone   Has the patient contacted their pharmacy? No. Pt states that he always has to contact the office to have this refilled. Please advise.  (Agent: If no, request that the patient contact the pharmacy for the refill.) (Agent: If yes, when and what did the pharmacy advise?)  Preferred Pharmacy (with phone number or street name):  [id number] [place], [place]  [place] [address]  Phone: [phone] Fax: [phone]  Hours: Not open 24 hours     Agent: Please be advised that RX refills may take up to 3 business days. We ask that you follow-up with your pharmacy.

## 2020-04-08 NOTE — Telephone Encounter (Signed)
Requested medication (s) are due for refill today: no  Requested medication (s) are on the active medication list: yes  Last refill:  03/26/2020  Future visit scheduled: yes  Notes to clinic:  this refill cannot be delegated    Requested Prescriptions  Pending Prescriptions Disp Refills   HYDROcodone-acetaminophen (NORCO) 10-325 MG tablet 60 tablet 0    Sig: Take 1 tablet by mouth every 6 (six) hours as needed.      Not Delegated - Analgesics:  Opioid Agonist Combinations Failed - 04/08/2020 12:28 PM      Failed - This refill cannot be delegated      Failed - Urine Drug Screen completed in last 360 days      Passed - Valid encounter within last 6 months    Recent Outpatient Visits           1 month ago Chronic bilateral low back pain with right-sided sciatica   [facility] Birdie Sons, MD   1 month ago [place], Donald E, MD   10 months ago Edema, lower extremity   [facility] Carles Collet M, [place]   1 year ago Nonintractable headache, unspecified chronicity pattern, unspecified headache type   [facility] Birdie Sons, MD   1 year ago Acute non-recurrent pansinusitis   [facility] [place], Clearnce Sorrel, [place]       Future Appointments             In 2 months Fisher, Kirstie Peri, MD [facility], [place]

## 2020-04-10 MED ORDER — HYDROCODONE-ACETAMINOPHEN 10-325 MG PO TABS: 1.0000 | ORAL_TABLET | Freq: Four times a day (QID) | ORAL | 0 refills | Status: DC | PRN

## 2020-04-24 ENCOUNTER — Other Ambulatory Visit: Payer: Self-pay | Admitting: Family Medicine

## 2020-04-24 DIAGNOSIS — G8929 Other chronic pain: Secondary | ICD-10-CM

## 2020-04-24 MED ORDER — HYDROCODONE-ACETAMINOPHEN 10-325 MG PO TABS: 1.0000 | ORAL_TABLET | Freq: Four times a day (QID) | ORAL | 0 refills | Status: DC | PRN

## 2020-04-24 NOTE — Telephone Encounter (Signed)
Medication Refill - Medication: hydrocodone 10-325 mg. Pt will be out of med on this sunday  Has the patient contacted their pharmacy? No. Pt did call his pharm due to medication controlled substance and he calls the office every month Preferred Pharmacy (with phone number [facility] street name[facility] [place] [address] phone number [phone]  Agent: Please be advised that RX refills may take up to 3 business days. We ask that you follow-up with your pharmacy.

## 2020-04-24 NOTE — Telephone Encounter (Signed)
Requested medication (s) are due for refill today: yes  Requested medication (s) are on the active medication list: yes  Last refill:  04/10/2020  Future visit scheduled: yes  Notes to clinic: this refill cannot be delegated  Patient will run out Sunday   Requested Prescriptions  Pending Prescriptions Disp Refills   HYDROcodone-acetaminophen (NORCO) 10-325 MG tablet 60 tablet 0    Sig: Take 1 tablet by mouth every 6 (six) hours as needed.      There is no refill protocol information for this order

## 2020-04-29 ENCOUNTER — Telehealth: Payer: Self-pay

## 2020-04-29 NOTE — Telephone Encounter (Signed)
Copied from [place] [id number]. Topic: General - Other >> Apr 29, 2020  8:23 AM Leward Quan A wrote: Reason for CRM: Patient called in to inform Dr Caryn Section that he was to be on 23 MG of omeprazole (PRILOSEC) 20 MG capsule say that was what his GI doctor put him on and think the low dose is what is messing with his stomach. Asking for a call at Ph# [phone]

## 2020-04-29 NOTE — Telephone Encounter (Signed)
If he likes, can send in new prescription for omeprazole 40mg  once a day, #30, rf x 3 and let me know if that doesn't help.

## 2020-04-30 MED ORDER — OMEPRAZOLE 40 MG PO CPDR: 40.0000 mg | DELAYED_RELEASE_CAPSULE | Freq: Every day | ORAL | 3 refills | Status: DC

## 2020-04-30 NOTE — Telephone Encounter (Signed)
Tried calling patient. Left message to call back. OK for [facility] triage to advise of message then route back to office with patient's response.

## 2020-04-30 NOTE — Telephone Encounter (Signed)
Patient is returning a call to the nurse.  Tried the office but nurse was busy.  Please call patient back at [phone]

## 2020-04-30 NOTE — Telephone Encounter (Signed)
Patient advised and verbalized understanding. Patient would like the medication sent in. Prescription sent into pharmacy.

## 2020-05-07 ENCOUNTER — Other Ambulatory Visit: Payer: Self-pay | Admitting: Family Medicine

## 2020-05-07 DIAGNOSIS — G8929 Other chronic pain: Secondary | ICD-10-CM

## 2020-05-07 NOTE — Telephone Encounter (Signed)
Note error, see previous version

## 2020-05-07 NOTE — Telephone Encounter (Signed)
Medication:  HYDROcodone-acetaminophen (NORCO) 10-325 MG tablet  Has the pt contacted their pharmacy? no [id number] [place], [place]  Please be advised refills may take up to 3 business days.  We ask that you follow up with your pharmacy.

## 2020-05-08 MED ORDER — HYDROCODONE-ACETAMINOPHEN 10-325 MG PO TABS: 1.0000 | ORAL_TABLET | Freq: Four times a day (QID) | ORAL | 0 refills | Status: DC | PRN

## 2020-05-09 DIAGNOSIS — F17210 Nicotine dependence, cigarettes, uncomplicated: Secondary | ICD-10-CM | POA: Diagnosis not present

## 2020-05-09 DIAGNOSIS — H5052 Exophoria: Secondary | ICD-10-CM | POA: Diagnosis not present

## 2020-05-09 DIAGNOSIS — H50111 Monocular exotropia, right eye: Secondary | ICD-10-CM | POA: Diagnosis not present

## 2020-05-09 DIAGNOSIS — Z9889 Other specified postprocedural states: Secondary | ICD-10-CM | POA: Diagnosis not present

## 2020-05-09 DIAGNOSIS — H5010 Unspecified exotropia: Secondary | ICD-10-CM | POA: Diagnosis not present

## 2020-05-09 DIAGNOSIS — H53001 Unspecified amblyopia, right eye: Secondary | ICD-10-CM | POA: Diagnosis not present

## 2020-05-20 ENCOUNTER — Telehealth: Payer: Self-pay | Admitting: Family Medicine

## 2020-05-20 DIAGNOSIS — G8929 Other chronic pain: Secondary | ICD-10-CM

## 2020-05-20 DIAGNOSIS — M5441 Lumbago with sciatica, right side: Secondary | ICD-10-CM

## 2020-05-20 NOTE — Telephone Encounter (Signed)
Medication Refill - Medication: HYDROcodone-acetaminophen (NORCO) 10-325 MG tablet [[id number]]  Has the patient contacted their pharmacy? Yes (Agent: If no, request that the patient contact the pharmacy for the refill.) call dr    Preferred Pharmacy (with phone number or street name): [id number] [place], [place]  [place] [address]  Phone: [phone] Fax: [phone]     Agent: Please be advised that RX refills may take up to 3 business days. We ask that you follow-up with your pharmacy.

## 2020-05-21 MED ORDER — HYDROCODONE-ACETAMINOPHEN 10-325 MG PO TABS: 1.0000 | ORAL_TABLET | Freq: Four times a day (QID) | ORAL | 0 refills | Status: DC | PRN

## 2020-05-22 ENCOUNTER — Other Ambulatory Visit: Payer: Self-pay | Admitting: Family Medicine

## 2020-05-22 DIAGNOSIS — I10 Essential (primary) hypertension: Secondary | ICD-10-CM

## 2020-05-22 NOTE — Telephone Encounter (Signed)
Refill requests for Metoprolol last filled 03/13/20, #9; Omeprazole last filled 04/30/20, #90; and Citalopram last filled 04/30/20. #90; spoke with Caryl Pina, Pharmacist to confirmed dates and amounts; she is not sure why the requests were made since it is not time for refills; attempted to notify; left message on voicemail refills declined.

## 2020-06-03 ENCOUNTER — Other Ambulatory Visit: Payer: Self-pay | Admitting: Family Medicine

## 2020-06-03 DIAGNOSIS — G8929 Other chronic pain: Secondary | ICD-10-CM

## 2020-06-03 NOTE — Telephone Encounter (Signed)
Requested medication (s) are due for refill today: yes  Requested medication (s) are on the active medication list: yes  Last refill: 05/21/20  Future visit scheduled: yes  Notes to clinic: not delegated   Requested Prescriptions  Pending Prescriptions Disp Refills   HYDROcodone-acetaminophen (NORCO) 10-325 MG tablet 60 tablet 0    Sig: Take 1 tablet by mouth every 6 (six) hours as needed.      Not Delegated - Analgesics:  Opioid Agonist Combinations Failed - 06/03/2020  8:26 AM      Failed - This refill cannot be delegated      Failed - Urine Drug Screen completed in last 360 days      Passed - Valid encounter within last 6 months    Recent Outpatient Visits           3 months ago Chronic bilateral low back pain with right-sided sciatica   [place], MD   3 months ago [place], Donald E, MD   1 year ago Edema, lower extremity   [place], [place], PA-C   1 year ago Nonintractable headache, unspecified chronicity pattern, unspecified headache type   [facility] Birdie Sons, MD   1 year ago Acute non-recurrent pansinusitis   [facility] [place], Clearnce Sorrel, [place]       Future Appointments             In 1 week Caryn Section, Kirstie Peri, MD [facility], [place]

## 2020-06-03 NOTE — Telephone Encounter (Signed)
Copied from [place] [phone] [phone]. Topic: Quick Communication - Rx Refill/Question >> Jun 03, 2020  8:11 AM Leward Quan A wrote: Medication: HYDROcodone-acetaminophen (NORCO) 10-325 MG tablet   Has the patient contacted their pharmacy? Yes.   (Agent: If no, request that the patient contact the pharmacy for the refill.) (Agent: If yes, when and what did the pharmacy advise?)  Preferred Pharmacy (with phone number or street name[id number] [place]  Phone:  [phone] Fax:  [phone]     Agent: Please be advised that RX refills may take up to 3 business days. We ask that you follow-up with your pharmacy.

## 2020-06-04 MED ORDER — HYDROCODONE-ACETAMINOPHEN 10-325 MG PO TABS: 1.0000 | ORAL_TABLET | Freq: Four times a day (QID) | ORAL | 0 refills | Status: DC | PRN

## 2020-06-04 NOTE — Telephone Encounter (Signed)
Requested medication (s) are due for refill today - unsure  Requested medication (s) are on the active medication list -yes  Future visit scheduled -yes  Last refill: 05/21/20  Notes to clinic: Request non delegated Rx  Requested Prescriptions  Pending Prescriptions Disp Refills   HYDROcodone-acetaminophen (NORCO) 10-325 MG tablet 60 tablet 0    Sig: Take 1 tablet by mouth every 6 (six) hours as needed.      Not Delegated - Analgesics:  Opioid Agonist Combinations Failed - 06/04/2020 11:57 AM      Failed - This refill cannot be delegated      Failed - Urine Drug Screen completed in last 360 days      Passed - Valid encounter within last 6 months    Recent Outpatient Visits           3 months ago Chronic bilateral low back pain with right-sided sciatica   [facility] Birdie Sons, MD   3 months ago [place], Donald E, MD   1 year ago Edema, lower extremity   [facility] Carles Collet M, PA-C   1 year ago Nonintractable headache, unspecified chronicity pattern, unspecified headache type   [facility] Birdie Sons, MD   1 year ago Acute non-recurrent pansinusitis   [place], [place]       Future Appointments             In 1 week Fisher, Kirstie Peri, MD [facility], [facility]                 Requested Prescriptions  Pending Prescriptions Disp Refills   HYDROcodone-acetaminophen (NORCO) 10-325 MG tablet 60 tablet 0    Sig: Take 1 tablet by mouth every 6 (six) hours as needed.      Not Delegated - Analgesics:  Opioid Agonist Combinations Failed - 06/04/2020 11:57 AM      Failed - This refill cannot be delegated      Failed - Urine Drug Screen completed in last 360 days      Passed - Valid encounter within last 6 months    Recent Outpatient Visits           3 months ago Chronic bilateral low back pain with right-sided sciatica    [place], MD   3 months ago [place], Donald E, MD   1 year ago Edema, lower extremity   [place], [place], PA-C   1 year ago Nonintractable headache, unspecified chronicity pattern, unspecified headache type   [facility] Birdie Sons, MD   1 year ago Acute non-recurrent pansinusitis   [facility] [place], Clearnce Sorrel, [place]       Future Appointments             In 1 week Caryn Section, Kirstie Peri, MD [facility], [place]

## 2020-06-04 NOTE — Telephone Encounter (Signed)
Pt called reporting that he will be completely out by tomorrow

## 2020-06-05 ENCOUNTER — Other Ambulatory Visit: Payer: Self-pay | Admitting: Family Medicine

## 2020-06-05 DIAGNOSIS — I10 Essential (primary) hypertension: Secondary | ICD-10-CM

## 2020-06-05 MED ORDER — METOPROLOL SUCCINATE ER 50 MG PO TB24: 50.0000 mg | ORAL_TABLET | Freq: Every evening | ORAL | 4 refills | Status: DC

## 2020-06-09 ENCOUNTER — Other Ambulatory Visit: Payer: Self-pay | Admitting: Family Medicine

## 2020-06-13 ENCOUNTER — Ambulatory Visit: Payer: Self-pay | Admitting: Family Medicine

## 2020-06-13 NOTE — Progress Notes (Deleted)
Established patient visit   Patient: Gary Carter   DOB: May 10, 1950   70 y.o. Male  MRN: [id number] Visit Date: 06/13/2020  Today's healthcare provider: Lelon Huh, MD   No chief complaint on file.  Subjective    HPI  Hypertension, follow-up  BP Readings from Last 3 Encounters:  03/22/20 (!) 126/100  02/26/20 113/72  10/03/19 121/85   Wt Readings from Last 3 Encounters:  03/22/20 187 lb (84.8 kg)  02/26/20 192 lb 12.8 oz (87.5 kg)  10/03/19 186 lb (84.4 kg)     He was last seen for hypertension 3 months ago.  BP at that visit was 113/72. Management since that visit includes continuing same medication.  He reports {excellent/good/fair/poor:19665} compliance with treatment. He {is/is not:9024} having side effects. {document side effects if present:1} He is following a {diet:21022986} diet. He {is/is not:9024} exercising. He {does/does not:200015} smoke.  Use of agents associated with hypertension: NSAIDS.   Outside blood pressures are {***enter patient reported home BP readings, or 'not being checked':1}. Symptoms: {Yes/No:20286} chest pain {Yes/No:20286} chest pressure  {Yes/No:20286} palpitations {Yes/No:20286} syncope  {Yes/No:20286} dyspnea {Yes/No:20286} orthopnea  {Yes/No:20286} paroxysmal nocturnal dyspnea {Yes/No:20286} lower extremity edema   Pertinent labs: Lab Results  Component Value Date   CHOL 162 05/18/2019   HDL 52 05/18/2019   LDLCALC 96 05/18/2019   TRIG 72 05/18/2019   CHOLHDL 3.1 05/18/2019   Lab Results  Component Value Date   NA 138 05/18/2019   K 4.6 05/18/2019   CREATININE 0.81 05/18/2019   GFRNONAA 93 05/18/2019   GFRAA 107 05/18/2019   GLUCOSE 106 (H) 05/18/2019     The 10-year ASCVD risk score Mikey Bussing DC Jr., et al., 2013) is: 18%   ---------------------------------------------------------------------------------------------------  Follow up for chronic pain:  The patient was last seen for this 3 months  ago. Changes made at last visit include none.  He reports {excellent/good/fair/poor:19665} compliance with treatment. He feels that condition is {improved/worse/unchanged:3041574}. He {is/is not:21021397} having side effects. ***  -----------------------------------------------------------------------------------------   {Show patient history (optional):23778::" "}   Medications: Outpatient Medications Prior to Visit  Medication Sig  . amLODipine (NORVASC) 10 MG tablet TAKE 1 TABLET BY MOUTH EVERY DAY  . aspirin 325 MG tablet Take 325 mg by mouth daily.   . Cholecalciferol (VITAMIN D3 PO) Take daily by mouth.  . citalopram (CELEXA) 40 MG tablet Take 1 tablet (40 mg total) by mouth daily.  . clonazePAM (KLONOPIN) 0.5 MG tablet TAKE 1 TABLET (0.5 MG TOTAL) BY MOUTH EVERY 8 HOURS AS NEEDED FOR ANXIETY.  . Cyanocobalamin (VITAMIN B-12 PO) Take by mouth daily.  . fluticasone (FLONASE) 50 MCG/ACT nasal spray Place 2 sprays into both nostrils daily.  Marland Kitchen HYDROcodone-acetaminophen (NORCO) 10-325 MG tablet Take 1 tablet by mouth every 6 (six) hours as needed.  . metoprolol succinate (TOPROL-XL) 50 MG 24 hr tablet Take 1 tablet (50 mg total) by mouth every evening. Take with or immediately following a meal.  . nortriptyline (PAMELOR) 25 MG capsule TAKE 1 AT BEDTIME FOR 1 WEEK, THEN INCREASE TO 2 AT BEDTIME TO HELP STOP SMOKING  . omeprazole (PRILOSEC) 40 MG capsule TAKE 1 CAPSULE (40 MG TOTAL) BY MOUTH DAILY.  . pravastatin (PRAVACHOL) 40 MG tablet TAKE 1 TABLET BY MOUTH EVERY DAY  . sildenafil (REVATIO) 20 MG tablet Take 1-2 tablets (20-40 mg total) by mouth daily.  Marland Kitchen spironolactone (ALDACTONE) 25 MG tablet TAKE 1 TABLET BY MOUTH EVERY DAY  . valsartan-hydrochlorothiazide (DIOVAN-HCT) 160-25 MG  tablet TAKE 1 TABLET BY MOUTH DAILY. TAKE IN PLACE OF ATACAND-HCTZ   No facility-administered medications prior to visit.    Review of Systems  {Labs  Heme  Chem  Endocrine  Serology  Results  Review (optional):23779::" "}   Objective    There were no vitals taken for this visit. {Show previous vital signs (optional):23777::" "}   Physical Exam  ***  No results found for any visits on 06/13/20.  Assessment & Plan     ***  No follow-ups on file.      {provider attestation***:1}   Lelon Huh, MD  [facility] [phone] (phone) [phone] (fax)  [place]

## 2020-06-17 ENCOUNTER — Other Ambulatory Visit: Payer: Self-pay | Admitting: Family Medicine

## 2020-06-17 ENCOUNTER — Telehealth (INDEPENDENT_AMBULATORY_CARE_PROVIDER_SITE_OTHER): Payer: Medicare Other | Admitting: Family Medicine

## 2020-06-17 DIAGNOSIS — Z5329 Procedure and treatment not carried out because of patient's decision for other reasons: Secondary | ICD-10-CM

## 2020-06-17 DIAGNOSIS — Z91199 Patient's noncompliance with other medical treatment and regimen due to unspecified reason: Secondary | ICD-10-CM

## 2020-06-17 DIAGNOSIS — G8929 Other chronic pain: Secondary | ICD-10-CM

## 2020-06-17 NOTE — Telephone Encounter (Signed)
Medication Refill - Medication: hydrocodone 10-325 mg Has the patient contacted their pharmacy? No due to controlled substance (Preferred Pharmacy (with phone number or street name): [facility] [place] [address] [facility] [place]: Please be advised that RX refills may take up to 3 business days. We ask that you follow-up with your pharmacy.

## 2020-06-17 NOTE — Progress Notes (Signed)
Patient did not show for virtual visit. Attempted to call several times before during his appointment time slot which he did not answer.

## 2020-06-17 NOTE — Telephone Encounter (Signed)
Requested medication (s) are due for refill today: no  Requested medication (s) are on the active medication list: yes  Last refill:  06/04/20  Future visit scheduled: no  Notes to clinic:  med not delegated to NT to RF   Requested Prescriptions  Pending Prescriptions Disp Refills   HYDROcodone-acetaminophen (NORCO) 10-325 MG tablet 60 tablet 0    Sig: Take 1 tablet by mouth every 6 (six) hours as needed.      Not Delegated - Analgesics:  Opioid Agonist Combinations Failed - 06/17/2020 11:27 AM      Failed - This refill cannot be delegated      Failed - Urine Drug Screen completed in last 360 days      Passed - Valid encounter within last 6 months    Recent Outpatient Visits           Today    [facility] Birdie Sons, MD   3 months ago Chronic bilateral low back pain with right-sided sciatica   [facility] Birdie Sons, MD   4 months ago [place], Donald E, MD   1 year ago Edema, lower extremity   [facility] Carles Collet M, [place]   1 year ago Nonintractable headache, unspecified chronicity pattern, unspecified headache type   [facility] Birdie Sons, MD

## 2020-06-19 MED ORDER — HYDROCODONE-ACETAMINOPHEN 10-325 MG PO TABS: 1.0000 | ORAL_TABLET | Freq: Four times a day (QID) | ORAL | 0 refills | Status: DC | PRN

## 2020-06-19 NOTE — Telephone Encounter (Signed)
Pt called about status of refill request/ pt states he only has one tablet left/ please advise

## 2020-06-22 ENCOUNTER — Other Ambulatory Visit: Payer: Self-pay | Admitting: Family Medicine

## 2020-06-22 DIAGNOSIS — N529 Male erectile dysfunction, unspecified: Secondary | ICD-10-CM

## 2020-06-22 NOTE — Telephone Encounter (Signed)
last RF 04/06/20 #60 5 RF

## 2020-06-24 ENCOUNTER — Ambulatory Visit: Payer: Self-pay | Admitting: *Deleted

## 2020-06-24 NOTE — Telephone Encounter (Signed)
I returned pt's call.    He is c/o both of his hands itching bad especially last night they were itching so bad he couldn't hardly sleep.  No rashes.   He mentioned his hands feel slightly swollen when he makes a fist but nothing visible as far as the swelling being noticeable.   This has been getting worse over the past 2 1/2 weeks. He works at a [employer], commissioning parking cars "So I'm in contact with a lot of dirty steering wheels".  "I don't know if I caught something from a steering wheel or what".    "I have to wash my hands a lot".      I don't think it's any of my medications.    I have not started anything new medicine wise.  "Maybe the BP medicine metoprolol or the cholesterol medicine Pravastatin, I don't really know to be honest".  I let him know I am sending a note to Dr. Caryn Section to see if an earlier appt can be set up for him.   He has an appt with Dr. Caryn Section on 07/15/2020 but that's too long a wait to be seen.   He was agreeable to someone calling him back.  I went over the care advice with him as some things to try for relief from the itching.     I let him know he could go on to an urgent care if the itching becomes worse before he could be seen at [facility].   He verbalized understanding.

## 2020-06-24 NOTE — Telephone Encounter (Signed)
Apt with Simona Huh 07/08/2020 at 3pm   Thanks,   -Mickel Baas

## 2020-06-24 NOTE — Telephone Encounter (Signed)
Reason for Disposition  [1] Cause unknown AND [2] present > 7 days    Both hands itching real bad.   No rash noted.  Answer Assessment - Initial Assessment Questions 1. DESCRIPTION: "Describe the itching you are having." "Where is it located?"     I'm having itching in both hands.   Metoprolol or the cholesterol med.  Maybe causing it. Itching 2 and 1/2 weeks.    No rash.  It started as a mild itch and now it's become so bad.   I can't sleep it's so bad.    No rash.   They look a little swollen and the palms are red due to scratching.   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 2. SEVERITY: "How bad is it?"    - MILD - doesn't interfere with normal activities   - MODERATE-SEVERE: interferes with work, school, sleep, or other activities      Severe.  I can't sleep due to my hands itching. 3. SCRATCHING: "Are there any scratch marks? Bleeding?"     No scratch marks.   My palms are red from scratching.   Top of my hands are starting to itch too. 4. ONSET: "When did the itching begin?"      2 1/2 weeks ago 5. CAUSE: "What do you think is causing the itching?"      May have caught something from off of a steering wheel of a car.   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 6. OTHER SYMPTOMS: "Do you have any other symptoms?"      No     I feel like my normal self. 7. PREGNANCY: "Is there any chance you are pregnant?" "When was your last menstrual period?"     N/A  Protocols used: Itching - Localized-A-AH

## 2020-06-25 ENCOUNTER — Telehealth: Payer: Self-pay | Admitting: *Deleted

## 2020-06-25 DIAGNOSIS — L299 Pruritus, unspecified: Secondary | ICD-10-CM

## 2020-06-25 NOTE — Telephone Encounter (Signed)
See yesterday's triage note:  Hand and palm itching has worsened. No rash/marks. He now thinks it could be metoprolol or pravastatin or more likely the hydrocodone-acetaminophen which the pharmacist gave him a different brand than usual.  He would like your thoughts on the medication and also concerned the itching could be caused by his liver-drinks alcohol daily and is asking if he could have blood drawn to check the liver prior to his appointment on 07/08/20. Explained to patient these thoughts can be discussed at the visit but he was insistent on advice from provider at this time.

## 2020-06-25 NOTE — Telephone Encounter (Signed)
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 He can do these any day he wants to come by fasting.

## 2020-06-27 NOTE — Telephone Encounter (Signed)
Patient advised.

## 2020-06-27 NOTE — Addendum Note (Signed)
Addended by: Shawna Orleans on: 06/27/2020 03:27 PM   Modules accepted: Orders

## 2020-06-29 ENCOUNTER — Other Ambulatory Visit: Payer: Self-pay | Admitting: Family Medicine

## 2020-06-29 DIAGNOSIS — N529 Male erectile dysfunction, unspecified: Secondary | ICD-10-CM

## 2020-06-29 NOTE — Telephone Encounter (Signed)
Requested Prescriptions  Pending Prescriptions Disp Refills  . sildenafil (REVATIO) 20 MG tablet [Pharmacy Med Name: SILDENAFIL 20 MG TABLET] 180 tablet     Sig: TAKE 1-2 TABLETS (20-40 MG TOTAL) BY MOUTH DAILY.     Urology: Erectile Dysfunction Agents Failed - 06/29/2020  1:22 PM      Failed - Last BP in normal range    BP Readings from Last 1 Encounters:  03/22/20 (!) 126/100         Passed - Valid encounter within last 12 months    Recent Outpatient Visits          1 week ago No-show for appointment   [place], MD   4 months ago Chronic bilateral low back pain with right-sided sciatica   [facility] Birdie Sons, MD   4 months ago [place], Donald E, MD   1 year ago Edema, lower extremity   [facility] Carles Collet M, [place]   1 year ago Nonintractable headache, unspecified chronicity pattern, unspecified headache type   [facility] Birdie Sons, MD      Future Appointments            In 1 week [place], Vickki Muff, PA-C [employer], [place]   In 2 weeks Caryn Section, Kirstie Peri, MD [facility], [place]           . pravastatin (PRAVACHOL) 40 MG tablet [Pharmacy Med Name: PRAVASTATIN SODIUM 40 MG TAB] 90 tablet     Sig: TAKE 1 TABLET BY MOUTH EVERY DAY     Cardiovascular:  Antilipid - Statins Failed - 06/29/2020  1:22 PM      Failed - Total Cholesterol in normal range and within 360 days    Cholesterol, Total  Date Value Ref Range Status  05/18/2019 162 100 - 199 mg/dL Final         Failed - LDL in normal range and within 360 days    LDL Chol Calc (NIH)  Date Value Ref Range Status  05/18/2019 96 0 - 99 mg/dL Final         Failed - HDL in normal range and within 360 days    HDL  Date Value Ref Range Status  05/18/2019 52 >39 mg/dL Final         Failed - Triglycerides in normal range and within 360 days    Triglycerides   Date Value Ref Range Status  05/18/2019 72 0 - 149 mg/dL Final         Passed - Patient is not pregnant      Passed - Valid encounter within last 12 months    Recent Outpatient Visits          1 week ago No-show for appointment   [facility] Birdie Sons, MD   4 months ago Chronic bilateral low back pain with right-sided sciatica   [facility] Birdie Sons, MD   4 months ago [place], Donald E, MD   1 year ago Edema, lower extremity   [place], [place], PA-C   1 year ago Nonintractable headache, unspecified chronicity pattern, unspecified headache type   [facility] Birdie Sons, MD      Future Appointments            In 1 week [place], Vickki Muff, PA-C [employer], [facility]  In 2 weeks Fisher, Kirstie Peri, MD [facility], [place]

## 2020-06-29 NOTE — Telephone Encounter (Signed)
Requested medication (s) are due for refill today: yes  Requested medication (s) are on the active medication list: yes  Last refill:  04/05/20  Future visit scheduled: yes  Notes to clinic:  overdue lab work    Requested Prescriptions  Pending Prescriptions Disp Refills   pravastatin (PRAVACHOL) 40 MG tablet [Pharmacy Med Name: PRAVASTATIN SODIUM 40 MG TAB] 90 tablet     Sig: TAKE 1 TABLET BY MOUTH EVERY DAY      Cardiovascular:  Antilipid - Statins Failed - 06/29/2020  1:22 PM      Failed - Total Cholesterol in normal range and within 360 days    Cholesterol, Total  Date Value Ref Range Status  05/18/2019 162 100 - 199 mg/dL Final          Failed - LDL in normal range and within 360 days    LDL Chol Calc (NIH)  Date Value Ref Range Status  05/18/2019 96 0 - 99 mg/dL Final          Failed - HDL in normal range and within 360 days    HDL  Date Value Ref Range Status  05/18/2019 52 >39 mg/dL Final          Failed - Triglycerides in normal range and within 360 days    Triglycerides  Date Value Ref Range Status  05/18/2019 72 0 - 149 mg/dL Final          Passed - Patient is not pregnant      Passed - Valid encounter within last 12 months    Recent Outpatient Visits           1 week ago No-show for appointment   [facility] Birdie Sons, MD   4 months ago Chronic bilateral low back pain with right-sided sciatica   [facility] Birdie Sons, MD   4 months ago [place], Donald E, MD   1 year ago Edema, lower extremity   [place], Adriana M, PA-C   1 year ago Nonintractable headache, unspecified chronicity pattern, unspecified headache type   [facility] Birdie Sons, MD       Future Appointments             In 1 week Chrismon, Vickki Muff, PA-C [employer], [facility]   In 2 weeks Caryn Section, Kirstie Peri, MD [facility], [facility]               Refused Prescriptions Disp Refills   sildenafil (REVATIO) 20 MG tablet [Pharmacy Med Name: SILDENAFIL 20 MG TABLET] 180 tablet     Sig: TAKE 1-2 TABLETS (20-40 MG TOTAL) BY MOUTH DAILY.      Urology: Erectile Dysfunction Agents Failed - 06/29/2020  1:22 PM      Failed - Last BP in normal range    BP Readings from Last 1 Encounters:  03/22/20 (!) 126/100          Passed - Valid encounter within last 12 months    Recent Outpatient Visits           1 week ago No-show for appointment   [place], MD   4 months ago Chronic bilateral low back pain with right-sided sciatica   [facility] Birdie Sons, MD   4 months ago [place], Donald E, MD   1 year ago Edema, lower extremity  [place], PA-C   1 year ago Nonintractable headache, unspecified chronicity pattern, unspecified headache type   [facility] Birdie Sons, MD       Future Appointments             In 1 week Chrismon, Vickki Muff, PA-C [employer], [place]   In 2 weeks Caryn Section, Kirstie Peri, MD [facility], [place]

## 2020-06-30 DIAGNOSIS — L299 Pruritus, unspecified: Secondary | ICD-10-CM | POA: Diagnosis not present

## 2020-07-01 ENCOUNTER — Other Ambulatory Visit: Payer: Self-pay | Admitting: Family Medicine

## 2020-07-01 DIAGNOSIS — M5441 Lumbago with sciatica, right side: Secondary | ICD-10-CM

## 2020-07-01 LAB — CBC WITH DIFFERENTIAL/PLATELET
Basophils Absolute: 0 10*3/uL (ref 0.0–0.2)
Basos: 1 %
EOS (ABSOLUTE): 0.1 10*3/uL (ref 0.0–0.4)
Eos: 2 %
Hematocrit: 45.7 % (ref 37.5–51.0)
Hemoglobin: 15.7 g/dL (ref 13.0–17.7)
Immature Grans (Abs): 0 10*3/uL (ref 0.0–0.1)
Immature Granulocytes: 1 %
Lymphocytes Absolute: 1.8 10*3/uL (ref 0.7–3.1)
Lymphs: 29 %
MCH: 32 pg (ref 26.6–33.0)
MCHC: 34.4 g/dL (ref 31.5–35.7)
MCV: 93 fL (ref 79–97)
Monocytes Absolute: 0.6 10*3/uL (ref 0.1–0.9)
Monocytes: 10 %
Neutrophils Absolute: 3.8 10*3/uL (ref 1.4–7.0)
Neutrophils: 57 %
Platelets: 178 10*3/uL (ref 150–450)
RBC: 4.91 x10E6/uL (ref 4.14–5.80)
RDW: 11.8 % (ref 11.6–15.4)
WBC: 6.5 10*3/uL (ref 3.4–10.8)

## 2020-07-01 LAB — COMPREHENSIVE METABOLIC PANEL
ALT: 25 IU/L (ref 0–44)
AST: 20 IU/L (ref 0–40)
Albumin/Globulin Ratio: 1.7 (ref 1.2–2.2)
Albumin: 4.3 g/dL (ref 3.8–4.8)
Alkaline Phosphatase: 63 IU/L (ref 44–121)
BUN/Creatinine Ratio: 22 (ref 10–24)
BUN: 20 mg/dL (ref 8–27)
Bilirubin Total: 0.5 mg/dL (ref 0.0–1.2)
CO2: 25 mmol/L (ref 20–29)
Calcium: 9.7 mg/dL (ref 8.6–10.2)
Chloride: 102 mmol/L (ref 96–106)
Creatinine, Ser: 0.92 mg/dL (ref 0.76–1.27)
Globulin, Total: 2.5 g/dL (ref 1.5–4.5)
Glucose: 117 mg/dL — ABNORMAL HIGH (ref 65–99)
Potassium: 4.6 mmol/L (ref 3.5–5.2)
Sodium: 139 mmol/L (ref 134–144)
Total Protein: 6.8 g/dL (ref 6.0–8.5)
eGFR: 91 mL/min/{1.73_m2} (ref >59)

## 2020-07-01 LAB — TSH: TSH: 0.571 u[IU]/mL (ref 0.450–4.500)

## 2020-07-01 NOTE — Telephone Encounter (Signed)
Requested medication (s) are due for refill today: yes   Requested medication (s) are on the active medication list: yes   Last refill:  06/17/2020  Future visit scheduled: yes   Notes to clinic: this refill cannot be delegated    Requested Prescriptions  Pending Prescriptions Disp Refills   HYDROcodone-acetaminophen (NORCO) 10-325 MG tablet 60 tablet 0    Sig: Take 1 tablet by mouth every 6 (six) hours as needed.      Not Delegated - Analgesics:  Opioid Agonist Combinations Failed - 07/01/2020  9:44 AM      Failed - This refill cannot be delegated      Failed - Urine Drug Screen completed in last 360 days      Passed - Valid encounter within last 6 months    Recent Outpatient Visits           2 weeks ago No-show for appointment   [facility] Birdie Sons, MD   4 months ago Chronic bilateral low back pain with right-sided sciatica   [facility] Birdie Sons, MD   4 months ago [place], Donald E, MD   1 year ago Edema, lower extremity   [facility] Carles Collet M, PA-C   1 year ago Nonintractable headache, unspecified chronicity pattern, unspecified headache type   [facility] Birdie Sons, MD       Future Appointments             In 1 week Chrismon, Vickki Muff, PA-C [employer], [place]   In 2 weeks Caryn Section, Kirstie Peri, MD [facility], [place]

## 2020-07-01 NOTE — Telephone Encounter (Signed)
Medication Refill - Medication: Hydrocodone 10/325  Has the patient contacted their pharmacy? No. (Agent: If no, request that the patient contact the pharmacy for the refill.) (Agent: If yes, when and what did the pharmacy advise?)  Preferred Pharmacy (with phone number [facility] street name[facility] [place]  Agent: Please be advised that RX refills may take up to 3 business days. We ask that you follow-up with your pharmacy.

## 2020-07-02 MED ORDER — HYDROCODONE-ACETAMINOPHEN 10-325 MG PO TABS: 1.0000 | ORAL_TABLET | Freq: Four times a day (QID) | ORAL | 0 refills | Status: DC | PRN

## 2020-07-08 ENCOUNTER — Encounter: Payer: Self-pay | Admitting: Family Medicine

## 2020-07-08 ENCOUNTER — Other Ambulatory Visit: Payer: Self-pay

## 2020-07-08 ENCOUNTER — Encounter: Payer: Medicare Other | Admitting: Family Medicine

## 2020-07-08 DIAGNOSIS — N529 Male erectile dysfunction, unspecified: Secondary | ICD-10-CM

## 2020-07-08 NOTE — Progress Notes (Deleted)
      Established patient visit   Patient: Gary Carter   DOB: 1950-01-29   70 y.o. Male  MRN: [id number] Visit Date: 07/08/2020  Today's healthcare provider: Vernie Murders, PA-C   Chief Complaint  Patient presents with   Follow-up    Bilateral hand itching.  Discuss lab results.    Subjective    HPI HPI     Follow-up    Additional comments: Bilateral hand itching.  Discuss lab results.       Last edited by Kizzie Furnish, CMA on 07/08/2020  2:51 PM.      ***     Medications: Outpatient Medications Prior to Visit  Medication Sig   amLODipine (NORVASC) 10 MG tablet TAKE 1 TABLET BY MOUTH EVERY DAY   aspirin 325 MG tablet Take 325 mg by mouth daily.    Cholecalciferol (VITAMIN D3 PO) Take daily by mouth.   citalopram (CELEXA) 40 MG tablet Take 1 tablet (40 mg total) by mouth daily.   clonazePAM (KLONOPIN) 0.5 MG tablet TAKE 1 TABLET (0.5 MG TOTAL) BY MOUTH EVERY 8 HOURS AS NEEDED FOR ANXIETY.   Cyanocobalamin (VITAMIN B-12 PO) Take by mouth daily.   fluticasone (FLONASE) 50 MCG/ACT nasal spray Place 2 sprays into both nostrils daily.   HYDROcodone-acetaminophen (NORCO) 10-325 MG tablet Take 1 tablet by mouth every 6 (six) hours as needed.   metoprolol succinate (TOPROL-XL) 50 MG 24 hr tablet Take 1 tablet (50 mg total) by mouth every evening. Take with or immediately following a meal.   nortriptyline (PAMELOR) 25 MG capsule TAKE 1 AT BEDTIME FOR 1 WEEK, THEN INCREASE TO 2 AT BEDTIME TO HELP STOP SMOKING   omeprazole (PRILOSEC) 40 MG capsule TAKE 1 CAPSULE (40 MG TOTAL) BY MOUTH DAILY.   sildenafil (REVATIO) 20 MG tablet Take 1-2 tablets (20-40 mg total) by mouth daily.   spironolactone (ALDACTONE) 25 MG tablet TAKE 1 TABLET BY MOUTH EVERY DAY   valsartan-hydrochlorothiazide (DIOVAN-HCT) 160-25 MG tablet TAKE 1 TABLET BY MOUTH DAILY. TAKE IN PLACE OF ATACAND-HCTZ   pravastatin (PRAVACHOL) 40 MG tablet Take 1 tablet (40 mg total) by mouth daily.   No  facility-administered medications prior to visit.    Review of Systems  Constitutional: Negative.  Negative for appetite change, chills and fever.  Respiratory: Negative.  Negative for chest tightness, shortness of breath and wheezing.   Cardiovascular: Negative.  Negative for chest pain and palpitations.  Gastrointestinal: Negative.  Negative for abdominal pain, nausea and vomiting.  Skin:  Negative for rash.  Neurological:  Negative for dizziness, light-headedness and headaches.      Objective    BP (!) 128/92 (BP Location: Right Arm, Patient Position: Sitting, Cuff Size: Large)   Pulse 63   Temp (!) 97 F (36.1 C) (Temporal)   Wt 191 lb (86.6 kg)   BMI 28.21 kg/m     Physical Exam  ***  No results found for any visits on 07/08/20.  Assessment & Plan     ***  No follow-ups on file.      {provider attestation***:1}   Vernie Murders, Hershal Coria  [facility] [phone] (phone) [phone] (fax)  [place]

## 2020-07-09 ENCOUNTER — Telehealth: Payer: Self-pay

## 2020-07-09 NOTE — Telephone Encounter (Signed)
Copied from [place] [phone]. Topic: Appointment Scheduling - Scheduling Inquiry for Clinic >> Jul 09, 2020  8:25 AM Bayard Beaver wrote: Reason for TAE:[id number] called just wanted to apologize for leaving his apt early yesterday

## 2020-07-09 NOTE — Telephone Encounter (Signed)
Appreciate his call. If we can be of help, let us know.

## 2020-07-15 ENCOUNTER — Other Ambulatory Visit: Payer: Self-pay

## 2020-07-15 ENCOUNTER — Ambulatory Visit (INDEPENDENT_AMBULATORY_CARE_PROVIDER_SITE_OTHER): Payer: Medicare Other | Admitting: Family Medicine

## 2020-07-15 ENCOUNTER — Encounter: Payer: Self-pay | Admitting: Family Medicine

## 2020-07-15 VITALS — BP 129/81 | HR 66 | Temp 97.2°F | Resp 18 | Wt 192.0 lb

## 2020-07-15 DIAGNOSIS — M5441 Lumbago with sciatica, right side: Secondary | ICD-10-CM

## 2020-07-15 DIAGNOSIS — I10 Essential (primary) hypertension: Secondary | ICD-10-CM | POA: Diagnosis not present

## 2020-07-15 DIAGNOSIS — G8929 Other chronic pain: Secondary | ICD-10-CM

## 2020-07-15 DIAGNOSIS — F419 Anxiety disorder, unspecified: Secondary | ICD-10-CM | POA: Diagnosis not present

## 2020-07-15 NOTE — Patient Instructions (Signed)
I strongly recommend at least one booster dose of the Covid vaccine for all adults. People over the age of 46 or at high risk for serious Covid infections should have a second booster dose 4-6 months after the first booster.

## 2020-07-15 NOTE — Progress Notes (Signed)
Established patient visit   Patient: Gary Carter   DOB: January 13, 1950   70 y.o. Male  MRN: [id number] Visit Date: 07/15/2020  Today's healthcare provider: Lelon Huh, MD   Chief Complaint  Patient presents with   Hypertension   Pain Management   Nicotine Dependence    Subjective    HPI   He had been having itching in hands and had labs done last month which were normal.  Hypertension, follow-up  BP Readings from Last 3 Encounters:  07/15/20 129/81  07/08/20 (!) 128/92  03/22/20 (!) 126/100   Wt Readings from Last 3 Encounters:  07/15/20 192 lb (87.1 kg)  07/08/20 191 lb (86.6 kg)  03/22/20 187 lb (84.8 kg)     He was last seen for hypertension 4 months ago.  BP at that visit was 113/72. Management since that visit includes continue same medications.  He reports good compliance with treatment. He is not having side effects.  He is following a Regular diet. He is not exercising. He does smoke.  Use of agents associated with hypertension: NSAIDS.   Outside blood pressures are 122/86. Symptoms: No chest pain No chest pressure  No palpitations No syncope  No dyspnea No orthopnea  No paroxysmal nocturnal dyspnea No lower extremity edema   Pertinent labs: Lab Results  Component Value Date   CHOL 162 05/18/2019   HDL 52 05/18/2019   LDLCALC 96 05/18/2019   TRIG 72 05/18/2019   CHOLHDL 3.1 05/18/2019   Lab Results  Component Value Date   NA 139 06/30/2020   K 4.6 06/30/2020   CREATININE 0.92 06/30/2020   GFRNONAA 93 05/18/2019   GFRAA 107 05/18/2019   GLUCOSE 117 (H) 06/30/2020     The 10-year ASCVD risk score Mikey Bussing DC Jr., et al., 2013) is: 20.5%   ---------------------------------------------------------------------------------------------------   Follow up for chronic pain:  The patient was last seen for this 4 months ago. Changes made at last visit include none. UDS was obtained. Patient advised to continue same pain medication  regiment.  He reports good compliance with treatment. He feels that condition is Unchanged. He is not having side effects.   -----------------------------------------------------------------------------------------   Follow up for Tobacco use:  The patient was last seen for this 4 months ago. Changes made at last visit include trying- nortriptyline (PAMELOR) 25 MG capsule; Take 1 at bedtime for 1 week, then increase to 2 at bedtime to help stop smoking.  He reports poor compliance with treatment. Patient states he stopped taking this medication because it caused him to feel nervous and shaky.  He feels that condition is Unchanged. Currently smoked 1/2 ppd. He is not interested in trying any other smoking cessation medications.  -----------------------------------------------------------------------------------------      Medications: Outpatient Medications Prior to Visit  Medication Sig   amLODipine (NORVASC) 10 MG tablet TAKE 1 TABLET BY MOUTH EVERY DAY   aspirin 325 MG tablet Take 325 mg by mouth daily.    Cholecalciferol (VITAMIN D3 PO) Take daily by mouth.   citalopram (CELEXA) 40 MG tablet Take 1 tablet (40 mg total) by mouth daily.   clonazePAM (KLONOPIN) 0.5 MG tablet TAKE 1 TABLET (0.5 MG TOTAL) BY MOUTH EVERY 8 HOURS AS NEEDED FOR ANXIETY.   Cyanocobalamin (VITAMIN B-12 PO) Take by mouth daily.   fluticasone (FLONASE) 50 MCG/ACT nasal spray Place 2 sprays into both nostrils daily.   HYDROcodone-acetaminophen (NORCO) 10-325 MG tablet Take 1 tablet by mouth every 6 (six) hours  as needed.   metoprolol succinate (TOPROL-XL) 50 MG 24 hr tablet Take 1 tablet (50 mg total) by mouth every evening. Take with or immediately following a meal.   omeprazole (PRILOSEC) 40 MG capsule TAKE 1 CAPSULE (40 MG TOTAL) BY MOUTH DAILY.   pravastatin (PRAVACHOL) 40 MG tablet Take 1 tablet (40 mg total) by mouth daily.   sildenafil (REVATIO) 20 MG tablet Take 1-2 tablets (20-40 mg total) by mouth  daily.   spironolactone (ALDACTONE) 25 MG tablet TAKE 1 TABLET BY MOUTH EVERY DAY   valsartan-hydrochlorothiazide (DIOVAN-HCT) 160-25 MG tablet TAKE 1 TABLET BY MOUTH DAILY. TAKE IN PLACE OF ATACAND-HCTZ   [DISCONTINUED] nortriptyline (PAMELOR) 25 MG capsule TAKE 1 AT BEDTIME FOR 1 WEEK, THEN INCREASE TO 2 AT BEDTIME TO HELP STOP SMOKING (Patient not taking: Reported on 07/15/2020)   No facility-administered medications prior to visit.    Review of Systems  Constitutional:  Negative for appetite change, chills and fever.  Respiratory:  Negative for chest tightness, shortness of breath and wheezing.   Cardiovascular:  Negative for chest pain and palpitations.  Gastrointestinal:  Negative for abdominal pain, nausea and vomiting.     Objective    BP 129/81 (BP Location: Left Arm, Patient Position: Sitting, Cuff Size: Normal)   Pulse 66   Temp (!) 97.2 F (36.2 C) (Temporal)   Resp 18   Wt 192 lb (87.1 kg)   BMI 28.35 kg/m    Physical Exam   General: Appearance:     Well developed, well nourished male in no acute distress  Eyes:    PERRL, conjunctiva/corneas clear, EOM's intact       Lungs:     Clear to auscultation bilaterally, respirations unlabored  Heart:    Normal heart rate. Normal rhythm. No murmurs, rubs, or gallops.   MS:   All extremities are intact.   Neurologic:   Awake, alert, oriented x 3. No apparent focal neurological           defect.         Assessment & Plan     1. Essential (primary) hypertension Well controlled.  Continue current medications.    2. Anxiety disorder, unspecified type Doing well on current medications.   3. Chronic bilateral low back pain with right-sided sciatica Doing well with current medications with no sign of diversion or abuse.        The entirety of the information documented in the History of Present Illness, Review of Systems and Physical Exam were personally obtained by me. Portions of this information were initially  documented by the CMA and reviewed by me for thoroughness and accuracy.     Lelon Huh, MD  [facility] [phone] (phone) [phone] (fax)  [place]

## 2020-07-16 ENCOUNTER — Other Ambulatory Visit: Payer: Self-pay | Admitting: Family Medicine

## 2020-07-16 DIAGNOSIS — G8929 Other chronic pain: Secondary | ICD-10-CM

## 2020-07-16 MED ORDER — HYDROCODONE-ACETAMINOPHEN 10-325 MG PO TABS: 1.0000 | ORAL_TABLET | Freq: Four times a day (QID) | ORAL | 0 refills | Status: DC | PRN

## 2020-07-16 NOTE — Telephone Encounter (Signed)
Medication Refill - Medication: HYDROcodone-acetaminophen (NORCO) 10-325 MG tablet  Has the patient contacted their pharmacy? No  Preferred Pharmacy (with phone number or street name[id number] [place] [place]  Phone:  [phone] Fax:  [phone]   Agent: Please be advised that RX refills may take up to 3 business days. We ask that you follow-up with your pharmacy.

## 2020-07-23 DIAGNOSIS — K219 Gastro-esophageal reflux disease without esophagitis: Secondary | ICD-10-CM | POA: Diagnosis not present

## 2020-07-23 DIAGNOSIS — R1319 Other dysphagia: Secondary | ICD-10-CM | POA: Diagnosis not present

## 2020-07-23 DIAGNOSIS — Z9889 Other specified postprocedural states: Secondary | ICD-10-CM | POA: Diagnosis not present

## 2020-07-23 DIAGNOSIS — Z1211 Encounter for screening for malignant neoplasm of colon: Secondary | ICD-10-CM | POA: Diagnosis not present

## 2020-07-29 ENCOUNTER — Other Ambulatory Visit: Payer: Self-pay | Admitting: Family Medicine

## 2020-07-29 DIAGNOSIS — G8929 Other chronic pain: Secondary | ICD-10-CM

## 2020-07-29 NOTE — Telephone Encounter (Signed)
Medication: HYDROcodone-acetaminophen (NORCO) 10-325 MG tablet Has the pt contacted their pharmacy? No  [id number] [place], [address] [place]  Please be advised refills may take up to 3 business days.  We ask that you follow up with your pharmacy.

## 2020-07-29 NOTE — Telephone Encounter (Signed)
Requested medication (s) are due for refill today:   Requested medication (s) are on the active medication list: Yes  Last refill:  07/16/20 # 60  Future visit scheduled: Yes  Notes to clinic:  See request.    Requested Prescriptions  Pending Prescriptions Disp Refills   HYDROcodone-acetaminophen (NORCO) 10-325 MG tablet 60 tablet 0    Sig: Take 1 tablet by mouth every 6 (six) hours as needed.      Not Delegated - Analgesics:  Opioid Agonist Combinations Failed - 07/29/2020  2:01 PM      Failed - This refill cannot be delegated      Failed - Urine Drug Screen completed in last 360 days      Passed - Valid encounter within last 6 months    Recent Outpatient Visits           2 weeks ago Essential (primary) hypertension   [place], Kirstie Peri, MD   1 month ago No-show for appointment   [facility] Birdie Sons, MD   5 months ago Chronic bilateral low back pain with right-sided sciatica   [facility] Birdie Sons, MD   5 months ago [place], Donald E, MD   1 year ago Edema, lower extremity   [place], [place], [place]

## 2020-07-31 MED ORDER — HYDROCODONE-ACETAMINOPHEN 10-325 MG PO TABS: 1.0000 | ORAL_TABLET | Freq: Four times a day (QID) | ORAL | 0 refills | Status: DC | PRN

## 2020-08-04 ENCOUNTER — Ambulatory Visit: Payer: Self-pay | Admitting: *Deleted

## 2020-08-04 MED ORDER — NIRMATRELVIR/RITONAVIR (PAXLOVID)TABLET: 3.0000 | ORAL_TABLET | Freq: Two times a day (BID) | ORAL | 0 refills | Status: AC

## 2020-08-04 NOTE — Addendum Note (Signed)
Addended by: Birdie Sons on: 08/04/2020 04:36 PM   Modules accepted: Orders

## 2020-08-04 NOTE — Addendum Note (Signed)
Addended by: Ashley Royalty E on: 08/04/2020 05:08 PM   Modules accepted: Orders

## 2020-08-04 NOTE — Telephone Encounter (Signed)
Patient is calling to report that he has had COVID symptoms since last night- he did + COVID home test today. Patient advised per COVID protocol. Patient notes sent for provider review- moderate risk patient.

## 2020-08-04 NOTE — Telephone Encounter (Signed)
Pt advised as directed below.   Thanks,   -Tameya Kuznia  

## 2020-08-04 NOTE — Telephone Encounter (Signed)
Recommend send prescription for paxlovid to pharmacy of his choice. He needs to stop pravastatin for 7 days and reduce citalopram to 1/2 tablet daily for 7 days.

## 2020-08-04 NOTE — Telephone Encounter (Signed)
Pt is calling to ask that he tested positve for Covid today. Pt has a dry cough and a headache and would like to know what he should do?  Reason for Disposition . [1] HIGH RISK for severe COVID complications (e.g., weak immune system, age > 28 years, obesity with BMI > 25, pregnant, chronic lung disease or other chronic medical condition) AND [2] COVID symptoms (e.g., cough, fever)  (Exceptions: Already seen by PCP and no new or worsening symptoms.)  Answer Assessment - Initial Assessment Questions 1. COVID-19 DIAGNOSIS: "Who made your COVID-19 diagnosis?" "Was it confirmed by a positive lab test or self-test?" If not diagnosed by a doctor (or NP/PA), ask "Are there lots of cases (community spread) where you live?" Note: See public health department website, if unsure.     + COVID home test today 2. COVID-19 EXPOSURE: "Was there any known exposure to COVID before the symptoms began?" CDC Definition of close contact: within 6 feet (2 meters) for a total of 15 minutes or more over a 24-hour period.      unknown 3. ONSET: "When did the COVID-19 symptoms start?"      Last night 4. WORST SYMPTOM: "What is your worst symptom?" (e.g., cough, fever, shortness of breath, muscle aches)     headache 5. COUGH: "Do you have a cough?" If Yes, ask: "How bad is the cough?"       Yes- not as bad today as last night 6. FEVER: "Do you have a fever?" If Yes, ask: "What is your temperature, how was it measured, and when did it start?"     Possible low grade 7. RESPIRATORY STATUS: "Describe your breathing?" (e.g., shortness of breath, wheezing, unable to speak)      Fine- no problem 8. BETTER-SAME-WORSE: "Are you getting better, staying the same or getting worse compared to yesterday?"  If getting worse, ask, "In what way?"     Same- headache is bad 9. HIGH RISK DISEASE: "Do you have any chronic medical problems?" (e.g., asthma, heart or lung disease, weak immune system, obesity, etc.)     Age, high BP 10. VACCINE:  "Have you had the COVID-19 vaccine?" If Yes, ask: "Which one, how many shots, when did you get it?"       Yes- Moderna 11. BOOSTER: "Have you received your COVID-19 booster?" If Yes, ask: "Which one and when did you get it?"       1 booster 12. PREGNANCY: "Is there any chance you are pregnant?" "When was your last menstrual period?"       N/a 13. OTHER SYMPTOMS: "Do you have any other symptoms?"  (e.g., chills, fatigue, headache, loss of smell or taste, muscle pain, sore throat)       Headache, fatigue, muscle aches 14. O2 SATURATION MONITOR:  "Do you use an oxygen saturation monitor (pulse oximeter) at home?" If Yes, ask "What is your reading (oxygen level) today?" "What is your usual oxygen saturation reading?" (e.g., 95%)       no  Protocols used: Coronavirus (COVID-19) Diagnosed or Suspected-A-AH

## 2020-08-06 ENCOUNTER — Other Ambulatory Visit: Payer: Self-pay | Admitting: Family Medicine

## 2020-08-06 NOTE — Telephone Encounter (Signed)
Requested Prescriptions  Pending Prescriptions Disp Refills  . omeprazole (PRILOSEC) 40 MG capsule [Pharmacy Med Name: OMEPRAZOLE DR 40 MG CAPSULE] 90 capsule 0    Sig: TAKE 1 CAPSULE (40 MG TOTAL) BY MOUTH DAILY.     Gastroenterology: Proton Pump Inhibitors Passed - 08/06/2020  3:31 PM      Passed - Valid encounter within last 12 months    Recent Outpatient Visits          3 weeks ago Essential (primary) hypertension   [place], Kirstie Peri, MD   1 month ago No-show for appointment   [facility] Birdie Sons, MD   5 months ago Chronic bilateral low back pain with right-sided sciatica   [facility] Birdie Sons, MD   5 months ago [place], Donald E, MD   1 year ago Edema, lower extremity   [place], [place], [place]

## 2020-08-13 ENCOUNTER — Other Ambulatory Visit: Payer: Self-pay | Admitting: Family Medicine

## 2020-08-13 DIAGNOSIS — M5441 Lumbago with sciatica, right side: Secondary | ICD-10-CM

## 2020-08-13 DIAGNOSIS — G8929 Other chronic pain: Secondary | ICD-10-CM

## 2020-08-13 NOTE — Telephone Encounter (Signed)
Medication Refill - Medication: Hydrocodone   Has the patient contacted their pharmacy? No. PT states that he always has to call this in. Please advise.  (Agent: If no, request that the patient contact the pharmacy for the refill.) (Agent: If yes, when and what did the pharmacy advise?)  Preferred Pharmacy (with phone number or street name[facility]  [id number] [place] [address] Phone: [phone]: [phone] Hours: Not open 24 hours   Agent: Please be advised that RX refills may take up to 3 business days. We ask that you follow-up with your pharmacy.

## 2020-08-13 NOTE — Telephone Encounter (Signed)
Requested medication (s) are due for refill today: Yes  Requested medication (s) are on the active medication list: Yes  Last refill:  07/31/20  Future visit scheduled: Yes  Notes to clinic:  Unable to refill per protocol, cannot delegate.      Requested Prescriptions  Pending Prescriptions Disp Refills   HYDROcodone-acetaminophen (NORCO) 10-325 MG tablet 60 tablet 0    Sig: Take 1 tablet by mouth every 6 (six) hours as needed.      Not Delegated - Analgesics:  Opioid Agonist Combinations Failed - 08/13/2020 11:51 AM      Failed - This refill cannot be delegated      Failed - Urine Drug Screen completed in last 360 days      Passed - Valid encounter within last 6 months    Recent Outpatient Visits           4 weeks ago Essential (primary) hypertension   [place], Kirstie Peri, MD   1 month ago No-show for appointment   [facility] Birdie Sons, MD   5 months ago Chronic bilateral low back pain with right-sided sciatica   [facility] Birdie Sons, MD   6 months ago [place], Donald E, MD   1 year ago Edema, lower extremity   [place], [place], [place]

## 2020-08-15 MED ORDER — HYDROCODONE-ACETAMINOPHEN 10-325 MG PO TABS: 1.0000 | ORAL_TABLET | Freq: Four times a day (QID) | ORAL | 0 refills | Status: DC | PRN

## 2020-08-27 ENCOUNTER — Other Ambulatory Visit: Payer: Self-pay | Admitting: Family Medicine

## 2020-08-27 DIAGNOSIS — G8929 Other chronic pain: Secondary | ICD-10-CM

## 2020-08-27 NOTE — Telephone Encounter (Signed)
  Requested medication (s) are on the active medication list:  yes   Last refill:  08/15/2020  Future visit scheduled: yes   Notes to clinic:  this refill cannot be delegated    Requested Prescriptions  Pending Prescriptions Disp Refills   HYDROcodone-acetaminophen (NORCO) 10-325 MG tablet 60 tablet 0    Sig: Take 1 tablet by mouth every 6 (six) hours as needed.     Not Delegated - Analgesics:  Opioid Agonist Combinations Failed - 08/27/2020  9:03 AM      Failed - This refill cannot be delegated      Failed - Urine Drug Screen completed in last 360 days      Passed - Valid encounter within last 6 months    Recent Outpatient Visits           1 month ago Essential (primary) hypertension   [facility] Birdie Sons, MD   2 months ago No-show for appointment   [facility] Birdie Sons, MD   6 months ago Chronic bilateral low back pain with right-sided sciatica   [facility] Birdie Sons, MD   6 months ago [place], Donald E, MD   1 year ago Edema, lower extremity   [place], [place], [place]

## 2020-08-27 NOTE — Telephone Encounter (Signed)
Copied from [place] [id number]. Topic: Quick Communication - Rx Refill/Question >> Aug 27, 2020  8:57 AM [name] A wrote: Medication: HYDROcodone-acetaminophen (NORCO) 10-325 MG tablet   Has the patient contacted their pharmacy? No. (Agent: If no, request that the patient contact the pharmacy for the refill.) (Agent: If yes, when and what did the pharmacy advise?)  Preferred Pharmacy (with phone number or street name): [id number] [place] [address] Phone:  [phone]:  [phone]    Agent: Please be advised that RX refills may take up to 3 business days. We ask that you follow-up with your pharmacy.

## 2020-08-29 MED ORDER — HYDROCODONE-ACETAMINOPHEN 10-325 MG PO TABS: 1.0000 | ORAL_TABLET | Freq: Four times a day (QID) | ORAL | 0 refills | Status: DC | PRN

## 2020-09-06 ENCOUNTER — Other Ambulatory Visit: Payer: Self-pay | Admitting: Family Medicine

## 2020-09-06 NOTE — Telephone Encounter (Signed)
Requested medication (s) are due for refill today: yes  Requested medication (s) are on the active medication list: yes  Last refill:  06/30/20  Future visit scheduled: yes  Notes to clinic:  overdue lab work   Requested Prescriptions  Pending Prescriptions Disp Refills   pravastatin (PRAVACHOL) 40 MG tablet [Pharmacy Med Name: PRAVASTATIN SODIUM 40 MG TAB] 30 tablet 0    Sig: TAKE 1 TABLET BY MOUTH EVERY DAY     Cardiovascular:  Antilipid - Statins Failed - 09/06/2020 11:33 AM      Failed - Total Cholesterol in normal range and within 360 days    Cholesterol, Total  Date Value Ref Range Status  05/18/2019 162 100 - 199 mg/dL Final          Failed - LDL in normal range and within 360 days    LDL Chol Calc (NIH)  Date Value Ref Range Status  05/18/2019 96 0 - 99 mg/dL Final          Failed - HDL in normal range and within 360 days    HDL  Date Value Ref Range Status  05/18/2019 52 >39 mg/dL Final          Failed - Triglycerides in normal range and within 360 days    Triglycerides  Date Value Ref Range Status  05/18/2019 72 0 - 149 mg/dL Final          Passed - Patient is not pregnant      Passed - Valid encounter within last 12 months    Recent Outpatient Visits           1 month ago Essential (primary) hypertension   [place], Donald E, MD   2 months ago No-show for appointment   [facility] Birdie Sons, MD   6 months ago Chronic bilateral low back pain with right-sided sciatica   [facility] Birdie Sons, MD   6 months ago [place], Donald E, MD   1 year ago Edema, lower extremity   [place], [place], [place]

## 2020-09-10 ENCOUNTER — Other Ambulatory Visit: Payer: Self-pay | Admitting: Family Medicine

## 2020-09-10 DIAGNOSIS — M5441 Lumbago with sciatica, right side: Secondary | ICD-10-CM

## 2020-09-10 DIAGNOSIS — G8929 Other chronic pain: Secondary | ICD-10-CM

## 2020-09-10 NOTE — Telephone Encounter (Signed)
Last refill: 08/29/2020 # 60, with no refills

## 2020-09-12 ENCOUNTER — Ambulatory Visit: Payer: Self-pay

## 2020-09-12 MED ORDER — HYDROCODONE-ACETAMINOPHEN 10-325 MG PO TABS: 1.0000 | ORAL_TABLET | Freq: Four times a day (QID) | ORAL | 0 refills | Status: DC | PRN

## 2020-09-12 NOTE — Telephone Encounter (Signed)
Message from Oneta Rack sent at 09/12/2020  9:20 AM EDT  Summary: Clinical Advice   Right side hip pain radiating down his leg for 1 day, patient unsure if he pulled a muscle seeking clinical advice.           Call History   Type Contact Phone/Fax User  09/12/2020 09:19 AM EDT Phone (Incoming) Gary Carter, Gary Carter (Self) [phone] [place]on Gammon   Reason for Disposition  [1] MODERATE pain (e.g., interferes with normal activities, limping) AND [2] present > 3 days  Answer Assessment - Initial Assessment Questions 1. LOCATION and RADIATION: "Where is the pain located?"      Right hip 2. QUALITY: "What does the pain feel like?"  (e.g., sharp, dull, aching, burning)     Ache 3. SEVERITY: "How bad is the pain?" "What does it keep you from doing?"   (Scale 1-10; or mild, moderate, severe)   -  MILD (1-3): doesn't interfere with normal activities    -  MODERATE (4-7): interferes with normal activities (e.g., work or school) or awakens from sleep, limping    -  SEVERE (8-10): excruciating pain, unable to do any normal activities, unable to walk     6 4. ONSET: "When did the pain start?" "Does it come and go, or is it there all the time?"     2 days 5. WORK OR EXERCISE: "Has there been any recent work or exercise that involved this part of the body?"      nO 6. CAUSE: "What do you think is causing the hip pain?"      Unsure 7. AGGRAVATING FACTORS: "What makes the hip pain worse?" (e.g., walking, climbing stairs, running)     Walking 8. OTHER SYMPTOMS: "Do you have any other symptoms?" (e.g., back pain, pain shooting down leg,  fever, rash)     Radiates to knee  Protocols used: Hip Pain-A-AH

## 2020-09-12 NOTE — Telephone Encounter (Signed)
Pt. Reports right hip pain x 2 days. Radiates down to knee. No known injury. States he is concerned it could be related to his back. Appointment made.

## 2020-09-16 ENCOUNTER — Ambulatory Visit
Admission: RE | Admit: 2020-09-16 | Discharge: 2020-09-16 | Disposition: A | Discharge status: Home / Self Care | Payer: Medicare Other | Source: Ambulatory Visit | Attending: Family Medicine | Admitting: Family Medicine

## 2020-09-16 ENCOUNTER — Ambulatory Visit (INDEPENDENT_AMBULATORY_CARE_PROVIDER_SITE_OTHER): Payer: Medicare Other | Admitting: Family Medicine

## 2020-09-16 ENCOUNTER — Ambulatory Visit
Admission: RE | Admit: 2020-09-16 | Discharge: 2020-09-16 | Disposition: A | Discharge status: Home / Self Care | Payer: Medicare Other | Attending: Family Medicine | Admitting: Family Medicine

## 2020-09-16 ENCOUNTER — Encounter: Payer: Self-pay | Admitting: Family Medicine

## 2020-09-16 ENCOUNTER — Other Ambulatory Visit: Payer: Self-pay

## 2020-09-16 DIAGNOSIS — M545 Low back pain, unspecified: Secondary | ICD-10-CM | POA: Diagnosis not present

## 2020-09-16 DIAGNOSIS — M5441 Lumbago with sciatica, right side: Secondary | ICD-10-CM | POA: Insufficient documentation

## 2020-09-16 DIAGNOSIS — Z23 Encounter for immunization: Secondary | ICD-10-CM

## 2020-09-16 MED ORDER — OXYCODONE-ACETAMINOPHEN 7.5-325 MG PO TABS
1.0000 | ORAL_TABLET | Freq: Four times a day (QID) | ORAL | 0 refills | Status: DC | PRN
Start: 2020-09-16 — End: 2020-10-28

## 2020-09-16 MED ORDER — PREDNISONE 10 MG PO TABS: ORAL_TABLET | ORAL | 0 refills | Status: AC

## 2020-09-16 NOTE — Patient Instructions (Signed)
Please review the attached list of medications and notify my office if there are any errors.   Go to the [facility] for lumbar spine Xray

## 2020-09-16 NOTE — Progress Notes (Signed)
Established patient visit   Patient: Gary Carter   DOB: Apr 17, 1950   70 y.o. Male  MRN: [id number] Visit Date: 09/16/2020  Today's healthcare provider: Lelon Huh, MD   Chief Complaint  Patient presents with   Hip Pain    Right hip pain started last week.     Subjective  -------------------------------------------------------------------------------------------------------------------- Hip Pain  The incident occurred 5 to 7 days ago (5 days ago (09/11/2020)). There was no injury mechanism. The pain is present in the right hip. The quality of the pain is described as stabbing. The pain has been Constant since onset. Associated symptoms include muscle weakness. Pertinent negatives include no inability to bear weight, loss of motion, loss of sensation, numbness or tingling. The symptoms are aggravated by movement and weight bearing. He has tried heat and NSAIDs (Home exercises) for the symptoms. The treatment provided no relief.  Pain originates in mid lumbar spine and runs down right buttocks and right lateral hip and thigh to knee.    Medications: Outpatient Medications Prior to Visit  Medication Sig   amLODipine (NORVASC) 10 MG tablet TAKE 1 TABLET BY MOUTH EVERY DAY   aspirin 325 MG tablet Take 325 mg by mouth daily.    Cholecalciferol (VITAMIN D3 PO) Take daily by mouth.   citalopram (CELEXA) 40 MG tablet Take 1 tablet (40 mg total) by mouth daily.   clonazePAM (KLONOPIN) 0.5 MG tablet TAKE 1 TABLET (0.5 MG TOTAL) BY MOUTH EVERY 8 HOURS AS NEEDED FOR ANXIETY.   Cyanocobalamin (VITAMIN B-12 PO) Take by mouth daily.   fluticasone (FLONASE) 50 MCG/ACT nasal spray Place 2 sprays into both nostrils daily.   HYDROcodone-acetaminophen (NORCO) 10-325 MG tablet Take 1 tablet by mouth every 6 (six) hours as needed.   metoprolol succinate (TOPROL-XL) 50 MG 24 hr tablet Take 1 tablet (50 mg total) by mouth every evening. Take with or immediately following a meal.   omeprazole  (PRILOSEC) 40 MG capsule TAKE 1 CAPSULE (40 MG TOTAL) BY MOUTH DAILY.   pravastatin (PRAVACHOL) 40 MG tablet Take 1 tablet (40 mg total) by mouth daily.   sildenafil (REVATIO) 20 MG tablet Take 1-2 tablets (20-40 mg total) by mouth daily.   spironolactone (ALDACTONE) 25 MG tablet TAKE 1 TABLET BY MOUTH EVERY DAY   valsartan-hydrochlorothiazide (DIOVAN-HCT) 160-25 MG tablet TAKE 1 TABLET BY MOUTH DAILY. TAKE IN PLACE OF ATACAND-HCTZ   No facility-administered medications prior to visit.    Review of Systems  Constitutional: Negative.   Respiratory: Negative.    Cardiovascular:  Positive for leg swelling. Negative for chest pain and palpitations.  Musculoskeletal:  Positive for arthralgias and back pain. Negative for gait problem, joint swelling, myalgias, neck pain and neck stiffness.  Neurological:  Negative for tingling and numbness.      Objective  -------------------------------------------------------------------------------------------------------------------- BP 113/85 (BP Location: Right Arm, Patient Position: Sitting, Cuff Size: Large)   Pulse 66   Wt 186 lb (84.4 kg)   SpO2 97%   BMI 27.47 kg/m    Physical Exam   Tender over lumbar sacral spine and right para lumbar muscles. FROM of HIP without pain. No lateral hip tenderness.    Assessment & Plan  ---------------------------------------------------------------------------------------------------------------------- 1. Acute right-sided low back pain with right-sided sciatica  - DG Lumbar Spine Complete; Future - oxyCODONE-acetaminophen (PERCOCET) 7.5-325 MG tablet; Take 1 tablet by mouth every 6 (six) hours as needed for severe pain.  Dispense: 30 tablet; Refill: 0 - predniSONE (DELTASONE) 10 MG tablet; 6  tablets for 2 days, then 5 for 2 days, then 4 for 2 days, then 3 for 2 days, then 2 for 2 days, then 1 for 2 days.  Dispense: 42 tablet; Refill: 0  2. Need for influenza vaccination  - Flu Vaccine QUAD High  Dose(Fluad)       The entirety of the information documented in the History of Present Illness, Review of Systems and Physical Exam were personally obtained by me. Portions of this information were initially documented by the CMA and reviewed by me for thoroughness and accuracy.     Lelon Huh, MD  [facility] ([place]) [phone] (phone) [phone] (fax)  [place]

## 2020-09-17 ENCOUNTER — Telehealth: Payer: Self-pay

## 2020-09-17 NOTE — Telephone Encounter (Signed)
Pt advised.   Thanks,   -Austynn Pridmore  

## 2020-09-17 NOTE — Telephone Encounter (Signed)
Xray shows extensive arthritis and disc disease, but no change since 2018. Go ahead and take entire course of prednisone. If not better when finished will refer to neurosurgery

## 2020-09-17 NOTE — Telephone Encounter (Signed)
Copied from [place] [id number]. Topic: General - Call Back - No Documentation >> Sep 17, 2020  9:52 AM Scherrie Gerlach wrote: Reason for CRM: pt would like cb concerning imaging results yesterday

## 2020-09-29 ENCOUNTER — Other Ambulatory Visit: Payer: Self-pay | Admitting: Family Medicine

## 2020-09-29 DIAGNOSIS — M5441 Lumbago with sciatica, right side: Secondary | ICD-10-CM

## 2020-09-29 DIAGNOSIS — G8929 Other chronic pain: Secondary | ICD-10-CM

## 2020-09-29 MED ORDER — VALSARTAN-HYDROCHLOROTHIAZIDE 160-25 MG PO TABS: 1.0000 | ORAL_TABLET | Freq: Every day | ORAL | 1 refills | Status: DC

## 2020-09-29 NOTE — Telephone Encounter (Signed)
Medication Refill - Medication:  valsartan-hydrochlorothiazide (DIOVAN-HCT) 160-25 MG tablet  HYDROcodone-acetaminophen (NORCO) 10-325 MG tablet   Has the patient contacted their pharmacy? No.  Preferred Pharmacy (with phone number or street name):  [id number] [id number] Phone:  [phone] Fax:  [phone] Has the patient been seen for an appointment in the last year OR does the patient have an upcoming appointment? Yes.    Agent: Please be advised that RX refills may take up to 3 business days. We ask that you follow-up with your pharmacy.

## 2020-09-29 NOTE — Telephone Encounter (Signed)
Requested medication (s) are due for refill today: Yes  Requested medication (s) are on the active medication list: Yes  Last refill:  09/12/20  Future visit scheduled: Yes  Notes to clinic:  Unable to refill per protocol, cannot delegate.      Requested Prescriptions  Pending Prescriptions Disp Refills   HYDROcodone-acetaminophen (NORCO) 10-325 MG tablet 60 tablet 0    Sig: Take 1 tablet by mouth every 6 (six) hours as needed.     Not Delegated - Analgesics:  Opioid Agonist Combinations Failed - 09/29/2020  3:52 PM      Failed - This refill cannot be delegated      Failed - Urine Drug Screen completed in last 360 days      Passed - Valid encounter within last 6 months    Recent Outpatient Visits           1 week ago Acute right-sided low back pain with right-sided sciatica   [facility] Birdie Sons, MD   2 months ago Essential (primary) hypertension   [facility] Birdie Sons, MD   3 months ago No-show for appointment   [facility] Birdie Sons, MD   7 months ago Chronic bilateral low back pain with right-sided sciatica   [place], MD   7 months ago [place], MD              Signed Prescriptions Disp Refills   valsartan-hydrochlorothiazide (DIOVAN-HCT) 160-25 MG tablet 90 tablet 1    Sig: Take 1 tablet by mouth daily. Take in place of atacand-hctz     Cardiovascular: ARB + Diuretic Combos Passed - 09/29/2020  3:52 PM      Passed - K in normal range and within 180 days    Potassium  Date Value Ref Range Status  06/30/2020 4.6 3.5 - 5.2 mmol/L Final          Passed - Na in normal range and within 180 days    Sodium  Date Value Ref Range Status  06/30/2020 139 134 - 144 mmol/L Final          Passed - Cr in normal range and within 180 days    Creatinine, Ser  Date Value Ref Range Status  06/30/2020 0.92 0.76 - 1.27  mg/dL Final          Passed - Ca in normal range and within 180 days    Calcium  Date Value Ref Range Status  06/30/2020 9.7 8.6 - 10.2 mg/dL Final          Passed - Patient is not pregnant      Passed - Last BP in normal range    BP Readings from Last 1 Encounters:  09/16/20 113/85          Passed - Valid encounter within last 6 months    Recent Outpatient Visits           1 week ago Acute right-sided low back pain with right-sided sciatica   [facility] Birdie Sons, MD   2 months ago Essential (primary) hypertension   [place], Kirstie Peri, MD   3 months ago No-show for appointment   [facility] Birdie Sons, MD   7 months ago Chronic bilateral low back pain with right-sided sciatica   [facility] Birdie Sons, MD   7 months ago  Bronchitis   [facility] Birdie Sons, MD

## 2020-09-30 MED ORDER — HYDROCODONE-ACETAMINOPHEN 10-325 MG PO TABS: 1.0000 | ORAL_TABLET | Freq: Four times a day (QID) | ORAL | 0 refills | Status: DC | PRN

## 2020-10-06 ENCOUNTER — Ambulatory Visit: Payer: Self-pay

## 2020-10-06 NOTE — Telephone Encounter (Signed)
Returned pt's call. Pt states that his Lexapro is no longer working he is feeling shaky and anxious. Pt does not know if this is a result of the prednisone he is taking or whether the Lexapro "just quit working".   Patient has considered taking additional Lexapro. I discouraged pt from this.  Pt is in no immediate distress. Made an appointment to see Dr. Rosanna Randy for 10/09/2020 for evaluation.  Pt agrees with this plan. And will call back if more immediate help is needed.

## 2020-10-08 ENCOUNTER — Telehealth: Payer: Self-pay

## 2020-10-08 NOTE — Telephone Encounter (Signed)
Copied from [place] [id number]. Topic: Quick Communication - Rx Refill/Question >> Oct 08, 2020  4:36 PM Erick Blinks wrote: Pt called to speak to [facility]. Message for her [facility] [address] [facility] use, hydrocodone 325 and not the other kind that makes me shaky"  Best contact: [phone]

## 2020-10-09 ENCOUNTER — Ambulatory Visit: Payer: Medicare Other | Admitting: Family Medicine

## 2020-10-10 NOTE — Telephone Encounter (Signed)
[facility] states [facility] manufactures [facility]cod[facility].  The new manufacture was making him feel "Shaky" at first.  He reports his symptoms have completely resolved and it okay with using the new manufacture now.   Thanks,   -Gary Carter

## 2020-10-13 ENCOUNTER — Other Ambulatory Visit: Payer: Self-pay | Admitting: Family Medicine

## 2020-10-13 DIAGNOSIS — G8929 Other chronic pain: Secondary | ICD-10-CM

## 2020-10-13 NOTE — Telephone Encounter (Signed)
Requested medication (s) are due for refill today: Yes  Requested medication (s) are on the active medication list: Yes  Last refill:  09/29/20  Future visit scheduled: Yes  Notes to clinic:  Unable to refill per protocol, cannot delegate.      Requested Prescriptions  Pending Prescriptions Disp Refills   HYDROcodone-acetaminophen (NORCO) 10-325 MG tablet 60 tablet 0    Sig: Take 1 tablet by mouth every 6 (six) hours as needed.     Not Delegated - Analgesics:  Opioid Agonist Combinations Failed - 10/13/2020  5:13 PM      Failed - This refill cannot be delegated      Failed - Urine Drug Screen completed in last 360 days      Passed - Valid encounter within last 6 months    Recent Outpatient Visits           3 weeks ago Acute right-sided low back pain with right-sided sciatica   [facility] Birdie Sons, MD   3 months ago Essential (primary) hypertension   [place], Kirstie Peri, MD   3 months ago No-show for appointment   [facility] Birdie Sons, MD   7 months ago Chronic bilateral low back pain with right-sided sciatica   [facility] Birdie Sons, MD   8 months ago [place], Donald E, MD       Future Appointments             In 1 month [place], Kirstie Peri, MD [facility], [place]

## 2020-10-13 NOTE — Telephone Encounter (Signed)
Medication:HYDROcodone-acetaminophen (NORCO) 10-325 MG tablet [[id number]]    Has the pt contacted their pharmacy?    [id number] [place] Phone:  [phone]  Fax:  [phone]        Please be advised refills may take up to 3 business days.  We ask that you follow up with your pharmacy.

## 2020-10-14 ENCOUNTER — Other Ambulatory Visit: Payer: Self-pay | Admitting: Family Medicine

## 2020-10-14 DIAGNOSIS — N529 Male erectile dysfunction, unspecified: Secondary | ICD-10-CM

## 2020-10-14 MED ORDER — HYDROCODONE-ACETAMINOPHEN 10-325 MG PO TABS: 1.0000 | ORAL_TABLET | Freq: Four times a day (QID) | ORAL | 0 refills | Status: DC | PRN

## 2020-10-27 ENCOUNTER — Other Ambulatory Visit: Payer: Self-pay | Admitting: Family Medicine

## 2020-10-27 DIAGNOSIS — M5441 Lumbago with sciatica, right side: Secondary | ICD-10-CM

## 2020-10-27 DIAGNOSIS — G8929 Other chronic pain: Secondary | ICD-10-CM

## 2020-10-27 NOTE — Telephone Encounter (Signed)
Requested medication (s) are due for refill today: no last signed 10/14/20  Requested medication (s) are on the active medication list: yes   Last refill:  10/14/20 #60 0 refills  Future visit scheduled: yes in 3 weeks   Notes to clinic:  signed 10/14/20. Patient requested again. Attempted to contact pharmacy to see if patient has attempted pick up and put on hold and unable to wait extended time. Receipt confirmed by pharmacy 10/14/20 at 7:43 am. Not delegated per protocol. Unable to refuse medication . Please advise.      Requested Prescriptions  Pending Prescriptions Disp Refills   HYDROcodone-acetaminophen (NORCO) 10-325 MG tablet 60 tablet 0    Sig: Take 1 tablet by mouth every 6 (six) hours as needed.     Not Delegated - Analgesics:  Opioid Agonist Combinations Failed - 10/27/2020  4:01 PM      Failed - This refill cannot be delegated      Failed - Urine Drug Screen completed in last 360 days      Passed - Valid encounter within last 6 months    Recent Outpatient Visits           1 month ago Acute right-sided low back pain with right-sided sciatica   [facility] Birdie Sons, MD   3 months ago Essential (primary) hypertension   [facility] Birdie Sons, MD   4 months ago No-show for appointment   [facility] Birdie Sons, MD   8 months ago Chronic bilateral low back pain with right-sided sciatica   [facility] Birdie Sons, MD   8 months ago [place], Donald E, MD       Future Appointments             In 3 weeks Fisher, Kirstie Peri, MD [facility], [place]

## 2020-10-27 NOTE — Telephone Encounter (Signed)
Medication Refill - Medication: HYDROcodone-acetaminophen (NORCO) 10-325 MG tablet   Has the patient contacted their pharmacy? No. (Agent: If no, request that the patient contact the pharmacy for the refill.) (Agent: If yes, when and what did the pharmacy advise?)pt stated you can request this from pharmacy and he has to call it in every time   Preferred Pharmacy (with phone number or street name): [id number] [place] [place], [place] - [address]  [place], [place] [address]  Phone:  [phone]  Fax:  [phone]  Has the patient been seen for an appointment in the last year OR does the patient have an upcoming appointment? Yes.    Agent: Please be advised that RX refills may take up to 3 business days. We ask that you follow-up with your pharmacy.

## 2020-10-28 MED ORDER — HYDROCODONE-ACETAMINOPHEN 10-325 MG PO TABS: 1.0000 | ORAL_TABLET | Freq: Four times a day (QID) | ORAL | 0 refills | Status: DC | PRN

## 2020-10-30 ENCOUNTER — Other Ambulatory Visit: Payer: Self-pay | Admitting: Family Medicine

## 2020-10-31 NOTE — Telephone Encounter (Signed)
Requested medications are due for refill today NO, requesting too early  Requested medications are on the active medication list yes  Last refill 10/8 and 9/19  Last visit 07/15/20  Future visit scheduled 11/21/20  Notes to clinic requesting refill too soon, please assess.  Requested Prescriptions  Pending Prescriptions Disp Refills   citalopram (CELEXA) 40 MG tablet [Pharmacy Med Name: CITALOPRAM HBR 40 MG TABLET] 90 tablet 1    Sig: TAKE 1 TABLET BY MOUTH EVERY DAY     Psychiatry:  Antidepressants - SSRI Passed - 10/30/2020  2:33 PM      Passed - Completed PHQ-2 or PHQ-9 in the last 360 days      Passed - Valid encounter within last 6 months    Recent Outpatient Visits           1 month ago Acute right-sided low back pain with right-sided sciatica   [facility] Birdie Sons, MD   3 months ago Essential (primary) hypertension   [place], Kirstie Peri, MD   4 months ago No-show for appointment   [facility] Birdie Sons, MD   8 months ago Chronic bilateral low back pain with right-sided sciatica   [facility] Birdie Sons, MD   8 months ago [place], MD       Future Appointments             In 3 weeks Fisher, Kirstie Peri, MD [facility]             omeprazole (PRILOSEC) 40 MG capsule [Pharmacy Med Name: OMEPRAZOLE DR 40 MG CAPSULE] 90 capsule 0    Sig: TAKE 1 CAPSULE (40 MG TOTAL) BY MOUTH DAILY.     Gastroenterology: Proton Pump Inhibitors Passed - 10/30/2020  2:33 PM      Passed - Valid encounter within last 12 months    Recent Outpatient Visits           1 month ago Acute right-sided low back pain with right-sided sciatica   [facility] Birdie Sons, MD   3 months ago Essential (primary) hypertension   [facility] Birdie Sons, MD   4 months ago No-show for appointment    [facility] Birdie Sons, MD   8 months ago Chronic bilateral low back pain with right-sided sciatica   [facility] Birdie Sons, MD   8 months ago [place], Donald E, MD       Future Appointments             In 3 weeks Fisher, Kirstie Peri, MD [facility], [place]

## 2020-11-10 ENCOUNTER — Other Ambulatory Visit: Payer: Self-pay | Admitting: Family Medicine

## 2020-11-10 DIAGNOSIS — G8929 Other chronic pain: Secondary | ICD-10-CM

## 2020-11-10 DIAGNOSIS — M5441 Lumbago with sciatica, right side: Secondary | ICD-10-CM

## 2020-11-10 NOTE — Telephone Encounter (Signed)
Requested medications are due for refill today.  yes  Requested medications are on the active medications list.  yes  Last refill. 10/28/2020  Future visit scheduled.   yes  Notes to clinic.  Medication not delegated.

## 2020-11-10 NOTE — Telephone Encounter (Signed)
Medication Refill - Medication: HYDROcodone-acetaminophen (NORCO) 10-325 MG tablet  Has the patient contacted their pharmacy? No  (Agent: If no, request that the patient contact the pharmacy for the refill. If patient does not wish to contact the pharmacy document the reason why and proceed with request.)  Patient declined to contact pharmacy because medication is a controlled substance.     Preferred Pharmacy (with phone number or street name):  [id number] [place], [place] Phone:  [phone]  Fax:  [phone]       Has the patient been seen for an appointment in the last year OR does the patient have an upcoming appointment? Yes.    Agent: Please be advised that RX refills may take up to 3 business days. We ask that you follow-up with your pharmacy.

## 2020-11-11 MED ORDER — HYDROCODONE-ACETAMINOPHEN 10-325 MG PO TABS: 1.0000 | ORAL_TABLET | Freq: Four times a day (QID) | ORAL | 0 refills | Status: DC | PRN

## 2020-11-21 ENCOUNTER — Ambulatory Visit: Payer: Medicare Other | Admitting: Family Medicine

## 2020-11-21 NOTE — Progress Notes (Deleted)
      Established patient visit   Patient: Gary Carter   DOB: 06/05/1950   70 y.o. Male  MRN: [id number] Visit Date: 11/21/2020  Today's healthcare provider: Lelon Huh, MD   No chief complaint on file.  Subjective    HPI  Depression and anxiety, Follow-up  He  was last seen for this 4 months ago. Changes made at last visit include none; continue same medications.   He reports {excellent/good/fair/poor:19665} compliance with treatment. He {is/is not:21021397} having side effects. ***  He reports {DESC; GOOD/FAIR/POOR:18685} tolerance of treatment. Current symptoms include: {Symptoms; depression:1002} He feels he is {improved/worse/unchanged:3041574} since last visit.  Depression screen [facility] 2/9 07/15/2020 07/08/2020 02/26/2020  Decreased Interest 0 0 2  Down, Depressed, Hopeless 0 0 0  PHQ - 2 Score 0 0 2  Altered sleeping 0 0 0  Tired, decreased energy 0 0 0  Change in appetite 0 0 0  Feeling bad or failure about yourself  0 0 1  Trouble concentrating 0 0 0  Moving slowly or fidgety/restless 0 0 0  Suicidal thoughts 0 0 0  PHQ-9 Score 0 0 3  Difficult doing work/chores Not difficult at all Not difficult at all Not difficult at all    -----------------------------------------------------------------------------------------   {Link to patient history deactivated due to formatting error:1}  Medications: Outpatient Medications Prior to Visit  Medication Sig   amLODipine (NORVASC) 10 MG tablet TAKE 1 TABLET BY MOUTH EVERY DAY   aspirin 325 MG tablet Take 325 mg by mouth daily.    Cholecalciferol (VITAMIN D3 PO) Take daily by mouth.   citalopram (CELEXA) 40 MG tablet TAKE 1 TABLET BY MOUTH EVERY DAY   clonazePAM (KLONOPIN) 0.5 MG tablet TAKE 1 TABLET (0.5 MG TOTAL) BY MOUTH EVERY 8 HOURS AS NEEDED FOR ANXIETY.   Cyanocobalamin (VITAMIN B-12 PO) Take by mouth daily.   fluticasone (FLONASE) 50 MCG/ACT nasal spray Place 2 sprays into both nostrils daily.    HYDROcodone-acetaminophen (NORCO) 10-325 MG tablet Take 1 tablet by mouth every 6 (six) hours as needed.   metoprolol succinate (TOPROL-XL) 50 MG 24 hr tablet Take 1 tablet (50 mg total) by mouth every evening. Take with or immediately following a meal.   omeprazole (PRILOSEC) 40 MG capsule TAKE 1 CAPSULE (40 MG TOTAL) BY MOUTH DAILY.   oxyCODONE-acetaminophen (PERCOCET) 7.5-325 MG tablet Take 1 tablet by mouth every 6 (six) hours as needed for severe pain.   pravastatin (PRAVACHOL) 40 MG tablet Take 1 tablet (40 mg total) by mouth daily.   sildenafil (REVATIO) 20 MG tablet TAKE 1-2 TABLET BY MOUTH EVERY DAILY   spironolactone (ALDACTONE) 25 MG tablet TAKE 1 TABLET BY MOUTH EVERY DAY   valsartan-hydrochlorothiazide (DIOVAN-HCT) 160-25 MG tablet Take 1 tablet by mouth daily. Take in place of atacand-hctz   No facility-administered medications prior to visit.    Review of Systems  {Labs  Heme  Chem  Endocrine  Serology  Results Review (optional):23779}   Objective    There were no vitals taken for this visit. {Show previous vital signs (optional):23777}  Physical Exam  ***  No results found for any visits on 11/21/20.  Assessment & Plan     ***  No follow-ups on file.      {provider attestation***:1}   Lelon Huh, MD  [facility] [phone] (phone) [phone] (fax)  [place]

## 2020-11-24 ENCOUNTER — Other Ambulatory Visit: Payer: Self-pay | Admitting: Family Medicine

## 2020-11-24 DIAGNOSIS — M5441 Lumbago with sciatica, right side: Secondary | ICD-10-CM

## 2020-11-24 DIAGNOSIS — G8929 Other chronic pain: Secondary | ICD-10-CM

## 2020-11-24 NOTE — Telephone Encounter (Signed)
Requested medication (s) are due for refill today:   Provider to review  Requested medication (s) are on the active medication list:   Yes  Future visit scheduled:   No   Last ordered: 11/11/2020 #78H 0 refills  Duplicate refill request for this non delegated refill   Requested Prescriptions  Pending Prescriptions Disp Refills   HYDROcodone-acetaminophen (NORCO) 10-325 MG tablet 60 tablet 0    Sig: Take 1 tablet by mouth every 6 (six) hours as needed.     Not Delegated - Analgesics:  Opioid Agonist Combinations Failed - 11/24/2020  3:14 PM      Failed - This refill cannot be delegated      Failed - Urine Drug Screen completed in last 360 days      Passed - Valid encounter within last 6 months    Recent Outpatient Visits           2 months ago Acute right-sided low back pain with right-sided sciatica   [facility] Birdie Sons, MD   4 months ago Essential (primary) hypertension   [place], Kirstie Peri, MD   5 months ago No-show for appointment   [facility] Birdie Sons, MD   9 months ago Chronic bilateral low back pain with right-sided sciatica   [facility] Birdie Sons, MD   9 months ago [place], Kirstie Peri, MD

## 2020-11-24 NOTE — Telephone Encounter (Signed)
Copied from [place] [phone]. Topic: Quick Communication - Rx Refill/Question >> Nov 24, 2020  8:08 AM Gary Carter wrote: Medication: HYDROcodone-acetaminophen (NORCO) 10-325 MG tablet Per patient he will be completely out tomorrow Has the patient contacted their pharmacy? No. Controlled substance  (Agent: If no, request that the patient contact the pharmacy for the refill. If patient does not wish to contact the pharmacy document the reason why and proceed with request.) (Agent: If yes, when and what did the pharmacy advise?)  Preferred Pharmacy (with phone number or street name): [id number] [place] [place]  Phone:  [phone] Fax:  [phone]    Has the patient been seen for an appointment in the last year OR does the patient have an upcoming appointment? Yes.    Agent: Please be advised that RX refills may take up to 3 business days. We ask that you follow-up with your pharmacy.

## 2020-11-24 NOTE — Telephone Encounter (Signed)
LOV:  09/16/2020

## 2020-11-25 ENCOUNTER — Ambulatory Visit: Payer: Self-pay | Admitting: *Deleted

## 2020-11-25 NOTE — Telephone Encounter (Signed)
Pt called in and was a little upset, pt states his back is in a lot of pain and wanted to get this medication sent over as soon as possible. Please advise.

## 2020-11-25 NOTE — Telephone Encounter (Signed)
Reason for Disposition  Numbness in a leg or foot (i.e., loss of sensation)    Pt requesting prednisone as this has happened before and has seen Dr. Caryn Section for it and had recent x rays per pt.  Answer Assessment - Initial Assessment Questions 1. SYMPTOM: "What is the main symptom you are concerned about?" (e.g., weakness, numbness)     Pt calling in c/o numbness in right leg.   X rays were done and it was arthritis.   I've had 2 back surgeries.  Gave me prednisone that helped and it's back again the numbness.   I may need to see a Psychologist, sport and exercise. 2. ONSET: "When did this start?" (minutes, hours, days; while sleeping)     Over 2 weeks. 3. LAST NORMAL: "When was the last time you (the patient) were normal (no symptoms)?"     2 weeks ago  When I put stuff in my right pocket it makes it worse. 4. PATTERN "Does this come and go, or has it been constant since it started?"  "Is it present now?"     He is supposed to call me in pain medicine.   I need some prednisone that helped more than anything.    The numbness is constant now.   Starts at the hip to front of leg to my kneecap. 5. CARDIAC SYMPTOMS: "Have you had any of the following symptoms: chest pain, difficulty breathing, palpitations?"     Not asked  6. NEUROLOGIC SYMPTOMS: "Have you had any of the following symptoms: headache, dizziness, vision loss, double vision, changes in speech, unsteady on your feet?"    No headaches or dizziness.    I had this when I had my first back surgery.   Same symptoms.  Refill the pain medication hydrocodone.   7. OTHER SYMPTOMS: "Do you have any other symptoms?"     No Can he refer a back surgeon.   My back surgeon has retired.   8. PREGNANCY: "Is there any chance you are pregnant?" "When was your last menstrual period?"     N/A  Answer Assessment - Initial Assessment Questions 1. ONSET: "When did the pain begin?"      Over 2 weeks ago 2. LOCATION: "Where does it hurt?" (upper, mid or lower back)     I'm having  numbness in my right leg from my hip down the front of my leg to my kneecap 3. SEVERITY: "How bad is the pain?"  (e.g., Scale 1-10; mild, moderate, or severe)   - MILD (1-3): doesn't interfere with normal activities    - MODERATE (4-7): interferes with normal activities or awakens from sleep    - SEVERE (8-10): excruciating pain, unable to do any normal activities      It's not bad but it's numb.  This is the same symptoms I had with my back when I had to have surgery twice before. 4. PATTERN: "Is the pain constant?" (e.g., yes, no; constant, intermittent)      The numbness is constant 5. RADIATION: "Does the pain shoot into your legs or elsewhere?"     No Just from my hip down front of leg to my kneecap 6. CAUSE:  "What do you think is causing the back pain?"      I've had 2 back surgeries.   My recent x rays showed arthritis in my back. 7. BACK OVERUSE:  "Any recent lifting of heavy objects, strenuous work or exercise?"     No  I'm still working  and am on my me a lot.   It bothers me then. 8. MEDICATIONS: "What have you taken so far for the pain?" (e.g., nothing, acetaminophen, NSAIDS)     I've requested a refill on the oxycodone Dr. Caryn Section gives me for the pain. 9. NEUROLOGIC SYMPTOMS: "Do you have any weakness, numbness, or problems with bowel/bladder control?"     Numbness in right leg.   No other symptoms.  He is walking fine on it but it does bother him when he is on his feet for long periods of time. 10. OTHER SYMPTOMS: "Do you have any other symptoms?" (e.g., fever, abdominal pain, burning with urination, blood in urine)       Denies headaches or dizziness or other symptoms.  He is requesting a new referral for a back surgeon.   His surgeon that did his other 2 back surgeries has retired.   11. PREGNANCY: "Is there any chance you are pregnant?" (e.g., yes, no; LMP)       N/A  Protocols used: Neurologic Deficit-A-AH, Back Pain-A-AH

## 2020-11-25 NOTE — Telephone Encounter (Signed)
Pt calling in requesting a refill on the prednisone.  He is c/o right leg numbness from his lower back (has had 2 back surgeries) that has been going on over 2 weeks now.   "This happened before my other 2 back surgeries".   "The prednisone really helps".   "Would Dr. Caryn Section call me in some prednisone?" See triage notes  I let pt know I would send his request to Dr. Caryn Section.   He also mentioned he called in for a refill on his oxycodone yesterday.   Pt was agreeable to this plan.   I let him know someone would be in touch with him with Dr. Maralyn Sago answer.  Notes sent to [facility] for Dr. Lelon Huh.

## 2020-11-25 NOTE — Telephone Encounter (Signed)
Pt calling to see if this Rx HYDROcodone-acetaminophen (NORCO) 10-325 MG tablet

## 2020-11-26 MED ORDER — HYDROCODONE-ACETAMINOPHEN 10-325 MG PO TABS: 1.0000 | ORAL_TABLET | Freq: Four times a day (QID) | ORAL | 0 refills | Status: DC | PRN

## 2020-11-28 ENCOUNTER — Encounter: Payer: Self-pay | Admitting: Family Medicine

## 2020-11-28 ENCOUNTER — Other Ambulatory Visit: Payer: Self-pay

## 2020-11-28 ENCOUNTER — Ambulatory Visit (INDEPENDENT_AMBULATORY_CARE_PROVIDER_SITE_OTHER): Payer: Medicare Other | Admitting: Family Medicine

## 2020-11-28 DIAGNOSIS — M5441 Lumbago with sciatica, right side: Secondary | ICD-10-CM | POA: Insufficient documentation

## 2020-11-28 DIAGNOSIS — G8929 Other chronic pain: Secondary | ICD-10-CM

## 2020-11-28 MED ORDER — PREDNISONE 10 MG PO TABS: ORAL_TABLET | ORAL | 0 refills | Status: DC

## 2020-11-28 NOTE — Addendum Note (Signed)
Addended by: Birdie Sons on: 11/28/2020 05:09 PM   Modules accepted: Orders

## 2020-11-28 NOTE — Assessment & Plan Note (Signed)
Recurrent pain after previous prednisone course Start prednisone with taper Referral to neurosurgery

## 2020-11-28 NOTE — Progress Notes (Signed)
Established patient visit   Patient: Gary Carter   DOB: October 13, 1950   70 y.o. Male  MRN: [id number] Visit Date: 11/28/2020  Today's healthcare provider: Lavon Paganini, MD   Chief Complaint  Patient presents with   Back Pain   Subjective    Back Pain Associated symptoms include numbness. Pertinent negatives include no weakness.   Burning and numbness on lateral aspect of right leg. Does not radiate past the right knee. Short course of prednisone back in September that helped for one month, but pain started coming back. Takes hydrocodone daily since second back operation 5-6 years, but it does not help with nerve pain. Popping in back when walking  Medications: Outpatient Medications Prior to Visit  Medication Sig   amLODipine (NORVASC) 10 MG tablet TAKE 1 TABLET BY MOUTH EVERY DAY   aspirin 325 MG tablet Take 325 mg by mouth daily.    Cholecalciferol (VITAMIN D3 PO) Take daily by mouth.   citalopram (CELEXA) 40 MG tablet TAKE 1 TABLET BY MOUTH EVERY DAY   Cyanocobalamin (VITAMIN B-12 PO) Take by mouth daily.   fluticasone (FLONASE) 50 MCG/ACT nasal spray Place 2 sprays into both nostrils daily.   HYDROcodone-acetaminophen (NORCO) 10-325 MG tablet Take 1 tablet by mouth every 6 (six) hours as needed.   metoprolol succinate (TOPROL-XL) 50 MG 24 hr tablet Take 1 tablet (50 mg total) by mouth every evening. Take with or immediately following a meal.   omeprazole (PRILOSEC) 40 MG capsule TAKE 1 CAPSULE (40 MG TOTAL) BY MOUTH DAILY.   pravastatin (PRAVACHOL) 40 MG tablet Take 1 tablet (40 mg total) by mouth daily.   sildenafil (REVATIO) 20 MG tablet TAKE 1-2 TABLET BY MOUTH EVERY DAILY   spironolactone (ALDACTONE) 25 MG tablet TAKE 1 TABLET BY MOUTH EVERY DAY   valsartan-hydrochlorothiazide (DIOVAN-HCT) 160-25 MG tablet Take 1 tablet by mouth daily. Take in place of atacand-hctz   clonazePAM (KLONOPIN) 0.5 MG tablet TAKE 1 TABLET (0.5 MG TOTAL) BY MOUTH EVERY 8 HOURS AS  NEEDED FOR ANXIETY. (Patient not taking: Reported on 11/28/2020)   oxyCODONE-acetaminophen (PERCOCET) 7.5-325 MG tablet Take 1 tablet by mouth every 6 (six) hours as needed for severe pain. (Patient not taking: Reported on 11/28/2020)   No facility-administered medications prior to visit.    Review of Systems  Constitutional: Negative.   Cardiovascular:  Negative for leg swelling.  Musculoskeletal:  Positive for arthralgias, back pain and gait problem.  Neurological:  Positive for numbness. Negative for weakness.      Objective    BP 140/86 (BP Location: Right Arm, Patient Position: Sitting, Cuff Size: Large)   Pulse 73   Temp (!) 97.2 F (36.2 C) (Temporal)   Resp 16   Wt 192 lb (87.1 kg)   SpO2 97%   BMI 28.35 kg/m  {Show previous vital signs (optional):23777}  Physical Exam Vitals reviewed.  Constitutional:      General: He is not in acute distress.    Appearance: Normal appearance. He is not ill-appearing or toxic-appearing.  HENT:     Head: Normocephalic and atraumatic.     Right Ear: External ear normal.     Left Ear: External ear normal.     Nose: Nose normal.     Mouth/Throat:     Mouth: Mucous membranes are moist.     Pharynx: Oropharynx is clear. No posterior oropharyngeal erythema.  Eyes:     General: No scleral icterus.    Extraocular Movements: Extraocular movements  intact.     Conjunctiva/sclera: Conjunctivae normal.     Pupils: Pupils are equal, round, and reactive to light.  Cardiovascular:     Rate and Rhythm: Normal rate and regular rhythm.     Pulses: Normal pulses.     Heart sounds: Normal heart sounds. No murmur heard.   No friction rub. No gallop.  Pulmonary:     Effort: Pulmonary effort is normal. No respiratory distress.     Breath sounds: Normal breath sounds. No wheezing, rhonchi or rales.  Abdominal:     General: Abdomen is flat. There is no distension.     Palpations: Abdomen is soft.     Tenderness: There is no abdominal tenderness.   Musculoskeletal:        General: Normal range of motion.     Cervical back: Normal range of motion and neck supple.  Skin:    General: Skin is warm and dry.     Findings: No lesion or rash.  Neurological:     General: No focal deficit present.     Mental Status: He is alert and oriented to person, place, and time.     Motor: No weakness.     Comments: Pain does not radiate past the right knee. Straight leg raise test negative.  Psychiatric:        Mood and Affect: Mood normal.        Behavior: Behavior normal.      No results found for any visits on 11/28/20.  Assessment & Plan     Problem List Items Addressed This Visit       Nervous and Auditory   Acute right-sided low back pain with right-sided sciatica    Recurrent pain after previous prednisone course Start prednisone with taper Referral to neurosurgery      Relevant Medications   predniSONE (DELTASONE) 10 MG tablet     Return in about 2 months (around 01/28/2021) for chronic disease f/u, as scheduled.      Nelva Nay, Medical Student 11/28/2020, 11:03 AM    Patient seen along with MS3 student Nelva Nay. I personally evaluated this patient along with the student, and verified all aspects of the history, physical exam, and medical decision making as documented by the student. I agree with the student's documentation and have made all necessary edits.  Railee Bonillas, Dionne Bucy, MD, MPH [facility]

## 2020-12-03 ENCOUNTER — Ambulatory Visit: Payer: Self-pay | Admitting: *Deleted

## 2020-12-03 NOTE — Telephone Encounter (Signed)
Summary: [place]   Patient experiencing congestion, coughing, no fever, for 2 days     Second attempt to reach patient- no answer and mailbox is full message- unable to leave message.

## 2020-12-03 NOTE — Telephone Encounter (Signed)
Third attempt to reach patient- no answer- same message-mailbox full unable to leave message.

## 2020-12-10 ENCOUNTER — Other Ambulatory Visit: Payer: Self-pay | Admitting: Family Medicine

## 2020-12-10 ENCOUNTER — Other Ambulatory Visit: Payer: Self-pay

## 2020-12-10 DIAGNOSIS — M5441 Lumbago with sciatica, right side: Secondary | ICD-10-CM

## 2020-12-10 DIAGNOSIS — G8929 Other chronic pain: Secondary | ICD-10-CM

## 2020-12-10 MED ORDER — HYDROCODONE-ACETAMINOPHEN 10-325 MG PO TABS: 1.0000 | ORAL_TABLET | Freq: Four times a day (QID) | ORAL | 0 refills | Status: DC | PRN

## 2020-12-10 NOTE — Telephone Encounter (Signed)
Copied from [name] [id number]. Topic: Quick Communication - See Telephone Encounter >> Dec 10, 2020 12:37 PM Loma [place] wrote: CRM for notification. See Telephone encounter for: 12/10/20. Medication Refill - Medication: HYDROcodone-acetaminophen (NORCO) 10-325 MG tablet 60 tablet 0 11/26/2020   Sig - Route: Take 1 tablet by mouth every 6 (six) hours as needed. - Oral  Sent to pharmacy as: HYDROcodone-acetaminophen (NORCO) 10-325 MG tablet  Earliest Fill Date: 11/26/2020   Pt has only one more pill, is very anxious that script has noe been responded to. Wanting to remind script every 15 days   Has the patient contacted their pharmacy? Yes.   (Agent: If no, request that the patient contact the pharmacy for the refill. If patient does not wish to contact the pharmacy document the reason why and proceed with request.) (Agent: If yes, when and what did the pharmacy advise?)dr  Preferred Pharmacy (with phone number or street name): HYDROcodone-acetaminophen (NORCO) 10-325 MG tablet 60 tablet 0 11/26/2020   Sig - Route: Take 1 tablet by mouth every 6 (six) hours as needed. - Oral  Sent to pharmacy as: HYDROcodone-acetaminophen [facility]) 10-325 MG tablet  Earliest Fill Date: 11/26/2020       Last visit Dr B 11/18   Has the patient been seen for an appointment in the last year OR does the patient have an upcoming appointment? Yes.    Agent: Please be advised that RX refills may take up to 3 business days. We ask that you follow-up with your pharmacy.

## 2020-12-10 NOTE — Telephone Encounter (Signed)
Copied from [place] [id number]. Topic: Quick Communication - Rx Refill/Question >> Dec 10, 2020 10:00 AM [name] A wrote: Medication: HYDROcodone-acetaminophen ([place]) 10-325 MG tablet [[id number]]   Has the patient contacted their pharmacy? Yes.  The patient shares that they've spoken with both the pharmacy and their PCP's office multiple times  (Agent: If no, request that the patient contact the pharmacy for the refill. If patient does not wish to contact the pharmacy document the reason why and proceed with request.) (Agent: If yes, when and what did the pharmacy advise?)  Preferred Pharmacy (with phone number or street name): [phone] Has the patient been seen for an appointment in the last year OR does the patient have an upcoming appointment? Yes.    Agent: Please be advised that RX refills may take up to 3 business days. We ask that you follow-up with your pharmacy.

## 2020-12-13 NOTE — Telephone Encounter (Signed)
Requested medication (s) are due for refill today: no  Requested medication (s) are on the active medication list: yes  Last refill:  12/10/20 #60   Future visit scheduled: yes  Notes to clinic:  med not delegated for NT to refill or refuse this med   Requested Prescriptions  Pending Prescriptions Disp Refills   HYDROcodone-acetaminophen (NORCO) 10-325 MG tablet 60 tablet 0    Sig: Take 1 tablet by mouth every 6 (six) hours as needed.     Not Delegated - Analgesics:  Opioid Agonist Combinations Failed - 12/10/2020 10:04 PM      Failed - This refill cannot be delegated      Failed - Urine Drug Screen completed in last 360 days      Passed - Valid encounter within last 6 months    Recent Outpatient Visits           2 weeks ago Chronic right-sided low back pain with right-sided sciatica   [facility] [place], Dionne Bucy, MD   2 months ago Acute right-sided low back pain with right-sided sciatica   [facility] Birdie Sons, MD   5 months ago Essential (primary) hypertension   [facility] Birdie Sons, MD   5 months ago No-show for appointment   [facility] Birdie Sons, MD   9 months ago Chronic bilateral low back pain with right-sided sciatica   [facility] Birdie Sons, MD

## 2020-12-15 MED ORDER — HYDROCODONE-ACETAMINOPHEN 10-325 MG PO TABS: 1.0000 | ORAL_TABLET | Freq: Four times a day (QID) | ORAL | 0 refills | Status: DC | PRN

## 2020-12-23 ENCOUNTER — Other Ambulatory Visit: Payer: Self-pay | Admitting: Family Medicine

## 2020-12-23 DIAGNOSIS — G8929 Other chronic pain: Secondary | ICD-10-CM

## 2020-12-23 NOTE — Telephone Encounter (Signed)
LOV:  11/28/2020  NOV:  01/20/2021

## 2020-12-23 NOTE — Telephone Encounter (Signed)
Requested medication (s) are due for refill today: no  Requested medication (s) are on the active medication list: yes  Last refill:  12/15/20 #60  Future visit scheduled: yes  Notes to clinic:  med not delegated to NT to RF   Requested Prescriptions  Pending Prescriptions Disp Refills   HYDROcodone-acetaminophen (NORCO) 10-325 MG tablet 60 tablet 0    Sig: Take 1 tablet by mouth every 6 (six) hours as needed.     Not Delegated - Analgesics:  Opioid Agonist Combinations Failed - 12/23/2020  9:21 AM      Failed - This refill cannot be delegated      Failed - Urine Drug Screen completed in last 360 days      Passed - Valid encounter within last 6 months    Recent Outpatient Visits           3 weeks ago Chronic right-sided low back pain with right-sided sciatica   [facility] [place], Dionne Bucy, MD   3 months ago Acute right-sided low back pain with right-sided sciatica   [facility] Birdie Sons, MD   5 months ago Essential (primary) hypertension   [facility] Birdie Sons, MD   6 months ago No-show for appointment   [facility] Birdie Sons, MD   10 months ago Chronic bilateral low back pain with right-sided sciatica   [facility] Birdie Sons, MD

## 2020-12-23 NOTE — Telephone Encounter (Signed)
Copied from [place] [phone]. Topic: Quick Communication - Rx Refill/Question >> Dec 23, 2020  8:16 AM Leward Quan A wrote: Medication: HYDROcodone-acetaminophen (NORCO) 10-325 MG tablet  Has the patient contacted their pharmacy? No. Controlled substance  (Agent: If no, request that the patient contact the pharmacy for the refill. If patient does not wish to contact the pharmacy document the reason why and proceed with request.) (Agent: If yes, when and what did the pharmacy advise?)  Preferred Pharmacy (with phone number or street name[id number] [place], [place]  Phone:  [phone] Fax:  [phone]    Has the patient been seen for an appointment in the last year OR does the patient have an upcoming appointment? Yes.    Agent: Please be advised that RX refills may take up to 3 business days. We ask that you follow-up with your pharmacy.

## 2020-12-25 ENCOUNTER — Other Ambulatory Visit: Payer: Self-pay | Admitting: Family Medicine

## 2020-12-26 MED ORDER — HYDROCODONE-ACETAMINOPHEN 10-325 MG PO TABS: 1.0000 | ORAL_TABLET | Freq: Four times a day (QID) | ORAL | 0 refills | Status: DC | PRN

## 2021-01-06 ENCOUNTER — Other Ambulatory Visit: Payer: Self-pay | Admitting: Family Medicine

## 2021-01-06 DIAGNOSIS — G8929 Other chronic pain: Secondary | ICD-10-CM

## 2021-01-06 NOTE — Telephone Encounter (Signed)
Medication Refill - Medication: HYDROcodone-acetaminophen (NORCO) 10-325 MG tablet. Will run out tomorrow   Has the patient contacted their pharmacy? No   (Agent: If no, request that the patient contact the pharmacy for the refill. If patient does not wish to contact the pharmacy document the reason why and proceed with request.) Caller states its a controlled substance and he calls his PCP directly   Preferred Pharmacy (with phone number or street name):  [id number] [place], [place] Phone:  [phone]  Fax:  [phone]      Has the patient been seen for an appointment in the last year OR does the patient have an upcoming appointment? Yes.    Agent: Please be advised that RX refills may take up to 3 business days. We ask that you follow-up with your pharmacy.

## 2021-01-06 NOTE — Telephone Encounter (Signed)
Attempted to call patient but no answer. Unable to leave a message on voicemail due to mailbox being full. Patient will need to contact pharmacy. Refill sent to pharmacy on 12/26/20.  Refill not delegated per protocol to refill or refuse.

## 2021-01-07 ENCOUNTER — Ambulatory Visit: Payer: Self-pay | Admitting: *Deleted

## 2021-01-07 ENCOUNTER — Other Ambulatory Visit: Payer: Self-pay | Admitting: Family Medicine

## 2021-01-07 MED ORDER — HYDROCODONE-ACETAMINOPHEN 10-325 MG PO TABS: 1.0000 | ORAL_TABLET | Freq: Four times a day (QID) | ORAL | 0 refills | Status: DC | PRN

## 2021-01-07 NOTE — Telephone Encounter (Signed)
Reviewed message from 01/06/21 hydrocodone refill request. Patient reports he is required to contact PCP every 15 days for refill. Requesting patient to contact pharmacy again to see if refill is ready . Start dated noted on refill sent to pharmacy 12/27/20.

## 2021-01-20 ENCOUNTER — Encounter: Payer: Self-pay | Admitting: Family Medicine

## 2021-01-20 NOTE — Progress Notes (Deleted)
Annual Wellness Visit     Patient: Gary Carter, Male    DOB: [birth year], 71 y.o.   MRN: [id number] Visit Date: 01/20/2021  Today's Provider: Lelon Huh, MD   No chief complaint on file.  Subjective    Gary Carter is a 71 y.o. male who presents today for his Annual Wellness Visit. He reports consuming a {diet types:17450} diet. {Exercise:19826} He generally feels {well/fairly well/poorly:18703}. He reports sleeping {well/fairly well/poorly:18703}. He {does/does not:200015} have additional problems to discuss today.   HPI Follow up for chronic low back pain:  The patient was last seen for this on 11/28/2020.   Changes made at last visit include starting prednisone taper and referring to neurosurgery.  He reports {excellent/good/fair/poor:19665} compliance with treatment. He feels that condition is {improved/worse/unchanged:3041574}. He {is/is not:21021397} having side effects. ***  -----------------------------------------------------------------------------------------   Hypertension, follow-up  BP Readings from Last 3 Encounters:  11/28/20 140/86  09/16/20 113/85  07/15/20 129/81   Wt Readings from Last 3 Encounters:  11/28/20 192 lb (87.1 kg)  09/16/20 186 lb (84.4 kg)  07/15/20 192 lb (87.1 kg)     He was last seen for hypertension 6 months ago.  BP at that visit was 129/81. Management since that visit includes continuing same medication.  He reports {excellent/good/fair/poor:19665} compliance with treatment. He {is/is not:9024} having side effects. {document side effects if present:1} He is following a {diet:21022986} diet. He {is/is not:9024} exercising. He {does/does not:200015} smoke.  Use of agents associated with hypertension: {bp agents assoc with hypertension:511::"none"}.   Outside blood pressures are {***enter patient reported home BP readings, or 'not being checked':1}. Symptoms: {Yes/No:20286} chest pain {Yes/No:20286} chest pressure   {Yes/No:20286} palpitations {Yes/No:20286} syncope  {Yes/No:20286} dyspnea {Yes/No:20286} orthopnea  {Yes/No:20286} paroxysmal nocturnal dyspnea {Yes/No:20286} lower extremity edema   Pertinent labs: Lab Results  Component Value Date   CHOL 162 05/18/2019   HDL 52 05/18/2019   LDLCALC 96 05/18/2019   TRIG 72 05/18/2019   CHOLHDL 3.1 05/18/2019   Lab Results  Component Value Date   NA 139 06/30/2020   K 4.6 06/30/2020   CREATININE 0.92 06/30/2020   EGFR 91 06/30/2020   GLUCOSE 117 (H) 06/30/2020   TSH 0.571 06/30/2020     The 10-year ASCVD risk score (Arnett DK, et al., 2019) is: 23.3%   ---------------------------------------------------------------------------------------------------   Anxiety, Follow-up  He was last seen for anxiety 6 months ago. Changes made at last visit include none; continue same medications.   He reports {excellent/good/fair/poor:19665} compliance with treatment. He reports {good/fair/poor:18685} tolerance of treatment. He {is/is not:21021397} having side effects. {document side effects if present:1}  He feels his anxiety is {Desc; severity:60313} and {improved/worse/unchanged:3041574} since last visit.  Symptoms: {Yes/No:20286} chest pain {Yes/No:20286} difficulty concentrating  {Yes/No:20286} dizziness {Yes/No:20286} fatigue  {Yes/No:20286} feelings of losing control {Yes/No:20286} insomnia  {Yes/No:20286} irritable {Yes/No:20286} palpitations  {Yes/No:20286} panic attacks {Yes/No:20286} racing thoughts  {Yes/No:20286} shortness of breath {Yes/No:20286} sweating  {Yes/No:20286} tremors/shakes    GAD-7 Results No flowsheet data found.  PHQ-9 Scores PHQ9 SCORE ONLY 07/15/2020 07/08/2020 02/26/2020  PHQ-9 Total Score 0 0 3    ---------------------------------------------------------------------------------------------------   Lipid/Cholesterol, Follow-up  Last lipid panel Other pertinent labs  Lab Results  Component Value Date   CHOL  162 05/18/2019   HDL 52 05/18/2019   LDLCALC 96 05/18/2019   TRIG 72 05/18/2019   CHOLHDL 3.1 05/18/2019   Lab Results  Component Value Date   ALT 25 06/30/2020   AST 20 06/30/2020   PLT  178 06/30/2020   TSH 0.571 06/30/2020     He was last seen for this on 05/18/2019.  Management since that visit includes continuing same medications.  He reports {excellent/good/fair/poor:19665} compliance with treatment. He {is/is not:9024} having side effects. {document side effects if present:1}  Symptoms: {Yes/No:20286} chest pain {Yes/No:20286} chest pressure/discomfort  {Yes/No:20286} dyspnea {Yes/No:20286} lower extremity edema  {Yes/No:20286} numbness or tingling of extremity {Yes/No:20286} orthopnea  {Yes/No:20286} palpitations {Yes/No:20286} paroxysmal nocturnal dyspnea  {Yes/No:20286} speech difficulty {Yes/No:20286} syncope   Current diet: {diet habits:16563} Current exercise: {exercise types:16438}  The 10-year ASCVD risk score (Arnett DK, et al., 2019) is: 23.3%  ---------------------------------------------------------------------------------------------------    Medications: Outpatient Medications Prior to Visit  Medication Sig   amLODipine (NORVASC) 10 MG tablet TAKE 1 TABLET BY MOUTH EVERY DAY   aspirin 325 MG tablet Take 325 mg by mouth daily.    Cholecalciferol (VITAMIN D3 PO) Take daily by mouth.   citalopram (CELEXA) 40 MG tablet TAKE 1 TABLET BY MOUTH EVERY DAY   clonazePAM (KLONOPIN) 0.5 MG tablet TAKE 1 TABLET (0.5 MG TOTAL) BY MOUTH EVERY 8 HOURS AS NEEDED FOR ANXIETY. (Patient not taking: Reported on 11/28/2020)   Cyanocobalamin (VITAMIN B-12 PO) Take by mouth daily.   fluticasone (FLONASE) 50 MCG/ACT nasal spray SPRAY 2 SPRAYS INTO EACH NOSTRIL EVERY DAY   HYDROcodone-acetaminophen (NORCO) 10-325 MG tablet Take 1 tablet by mouth every 6 (six) hours as needed.   metoprolol succinate (TOPROL-XL) 50 MG 24 hr tablet Take 1 tablet (50 mg total) by mouth every  evening. Take with or immediately following a meal.   omeprazole (PRILOSEC) 40 MG capsule TAKE 1 CAPSULE (40 MG TOTAL) BY MOUTH DAILY.   pravastatin (PRAVACHOL) 40 MG tablet TAKE 1 TABLET BY MOUTH EVERY DAY   predniSONE (DELTASONE) 10 MG tablet Take 29m PO daily x2d, then 560mdaily x2d, then 409maily x2d, then 32m59mily x2d, then 20mg41mly x2d, then 10mg 37my x2d, then stop   sildenafil (REVATIO) 20 MG tablet TAKE 1-2 TABLET BY MOUTH EVERY DAILY   spironolactone (ALDACTONE) 25 MG tablet TAKE 1 TABLET BY MOUTH EVERY DAY   valsartan-hydrochlorothiazide (DIOVAN-HCT) 160-25 MG tablet Take 1 tablet by mouth daily. Take in place of atacand-hctz   No facility-administered medications prior to visit.    Allergies  Allergen Reactions   Hctz [Hydrochlorothiazide]     Leg cramps    Patient Care Team: FisherBirdie Sonss PCP - General (Family Medicine)  Review of Systems  {Labs   Heme   Chem   Endocrine   Serology   Results Review (optional):23779}    Objective    Vitals: There were no vitals taken for this visit. {Show previous vital signs (optional):23777}  Physical Exam ***  Most recent functional status assessment: In your present state of health, do you have any difficulty performing the following activities: 07/15/2020  Hearing? N  Vision? N  Difficulty concentrating or making decisions? N  Walking or climbing stairs? N  Dressing or bathing? N  Doing errands, shopping? N  Some recent data might be hidden   Most recent fall risk assessment: Fall Risk  07/15/2020  Falls in the past year? 0  Number falls in past yr: 0  Injury with Fall? 0  Risk for fall due to : -  Follow up Falls evaluation completed    Most recent depression screenings: PHQ 2/9 Scores 07/15/2020 07/08/2020  PHQ - 2 Score 0 0  PHQ- 9 Score 0 0   Most  recent cognitive screening: No flowsheet data found. Most recent Audit-C alcohol use screening Alcohol Use Disorder Test (AUDIT) 07/08/2020  1. How  often do you have a drink containing alcohol? 3  2. How many drinks containing alcohol do you have on a typical day when you are drinking? 0  3. How often do you have six or more drinks on one occasion? 0  AUDIT-C Score 3  4. How often during the last year have you found that you were not able to stop drinking once you had started? -  5. How often during the last year have you failed to do what was normally expected from you because of drinking? -  6. How often during the last year have you needed a first drink in the morning to get yourself going after a heavy drinking session? -  7. How often during the last year have you had a feeling of guilt of remorse after drinking? -  8. How often during the last year have you been unable to remember what happened the night before because you had been drinking? -  9. Have you or someone else been injured as a result of your drinking? -  10. Has a relative or friend or a doctor or another health worker been concerned about your drinking or suggested you cut down? -  Alcohol Use Disorder Identification Test Final Score (AUDIT) -  Alcohol Brief Interventions/Follow-up -   A score of 3 or more in women, and 4 or more in men indicates increased risk for alcohol abuse, EXCEPT if all of the points are from question 1   No results found for any visits on 01/20/21.  Assessment & Plan     Annual wellness visit done today including the all of the following: Reviewed patient's Family Medical History Reviewed and updated list of patient's medical providers Assessment of cognitive impairment was done Assessed patient's functional ability Established a written schedule for health screening [place] Completed and Reviewed  Exercise Activities and Dietary recommendations  Goals      Quit Smoking     Recommend to continue efforts to reduce smoking habits until no longer smoking.          Immunization History  Administered Date(s)  Administered   Fluad Quad(high Dose 65+) 11/27/2019, 09/16/2020   Influenza Split 10/08/2011   Influenza, High Dose Seasonal PF 09/16/2017   Influenza,inj,Quad PF,6+ Mos 10/09/2013, 09/14/2016   Influenza-Unspecified 10/29/2014, 09/16/2017, 10/01/2018   Moderna Sars-Covid-2 Vaccination 11/27/2019   Pneumococcal Conjugate-13 11/15/2017   Pneumococcal Polysaccharide-23 10/08/2011, 05/18/2019   Tdap 10/08/2011   Zoster Recombinat (Shingrix) 02/07/2017, 04/21/2017   Zoster, Live 10/09/2013    Health Maintenance  Topic Date Due   COVID-19 Vaccine (2 - Moderna risk series) 12/25/2019   COLONOSCOPY (Pts 45-80yr Insurance coverage will need to be confirmed)  06/17/2021   TETANUS/TDAP  10/07/2021   Pneumonia Vaccine 69 Years old  Completed   INFLUENZA VACCINE  Completed   Hepatitis C Screening  Completed   Zoster Vaccines- Shingrix  Completed   HPV VACCINES  Aged Out     Discussed health benefits of physical activity, and encouraged him to engage in regular exercise appropriate for his age and condition.    ***  No follow-ups on file.     {provider attestation***:1}   DLelon Huh MD  [facility] [id number] [phone](phone) [phone](fax)  [place]

## 2021-01-23 ENCOUNTER — Ambulatory Visit (INDEPENDENT_AMBULATORY_CARE_PROVIDER_SITE_OTHER): Payer: Medicare Other | Admitting: Family Medicine

## 2021-01-23 ENCOUNTER — Other Ambulatory Visit: Payer: Self-pay

## 2021-01-23 ENCOUNTER — Encounter: Payer: Self-pay | Admitting: Family Medicine

## 2021-01-23 VITALS — BP 135/94 | HR 78 | Temp 98.2°F | Wt 193.0 lb

## 2021-01-23 DIAGNOSIS — F339 Major depressive disorder, recurrent, unspecified: Secondary | ICD-10-CM

## 2021-01-23 DIAGNOSIS — G8929 Other chronic pain: Secondary | ICD-10-CM

## 2021-01-23 DIAGNOSIS — I10 Essential (primary) hypertension: Secondary | ICD-10-CM

## 2021-01-23 DIAGNOSIS — M5441 Lumbago with sciatica, right side: Secondary | ICD-10-CM | POA: Diagnosis not present

## 2021-01-23 MED ORDER — HYDROCODONE-ACETAMINOPHEN 10-325 MG PO TABS: 1.0000 | ORAL_TABLET | Freq: Four times a day (QID) | ORAL | 0 refills | Status: DC | PRN

## 2021-01-23 NOTE — Progress Notes (Signed)
Established patient visit   Patient: Gary Carter   DOB: [birth year]   71 y.o. Male  MRN: [id number] Visit Date: 01/23/2021  Today's healthcare provider: Lelon Huh, MD   No chief complaint on file.  Subjective    HPI  Hypertension, follow-up  BP Readings from Last 3 Encounters:  01/23/21 (!) 135/94  11/28/20 140/86  09/16/20 113/85   Wt Readings from Last 3 Encounters:  01/23/21 193 lb (87.5 kg)  11/28/20 192 lb (87.1 kg)  09/16/20 186 lb (84.4 kg)     He was last seen for hypertension 6 months ago.  BP at that visit was 129/81. Management since that visit includes no changes.  He reports excellent compliance with treatment. He is not having side effects.  He is following a Low Sodium diet. He is exercising. He does smoke.  Use of agents associated with hypertension: none.   Outside blood pressures are not being checked. Symptoms: No chest pain No chest pressure  No palpitations No syncope  No dyspnea No orthopnea  No paroxysmal nocturnal dyspnea No lower extremity edema   Pertinent labs: Lab Results  Component Value Date   CHOL 162 05/18/2019   HDL 52 05/18/2019   LDLCALC 96 05/18/2019   TRIG 72 05/18/2019   CHOLHDL 3.1 05/18/2019   Lab Results  Component Value Date   NA 139 06/30/2020   K 4.6 06/30/2020   CREATININE 0.92 06/30/2020   EGFR 91 06/30/2020   GLUCOSE 117 (H) 06/30/2020   TSH 0.571 06/30/2020     The 10-year ASCVD risk score (Arnett DK, et al., 2019) is: 22%   ---------------------------------------------------------------------------------------------------  He is also due for follow up depression and anxiety on citalopram and clonazepam which he feels continue to work well.   His main concern today is that he does not tolerate certain generic versions of his pain medications which cause tremors and agitation. He is midway through his most recent refill of a brand that he does not tolerate. His pharmacy told him they  could dispense 1/2 of a months worth with a brand that he does tolerate, but needs new prescription.   Medications: Outpatient Medications Prior to Visit  Medication Sig   amLODipine (NORVASC) 10 MG tablet TAKE 1 TABLET BY MOUTH EVERY DAY   aspirin 325 MG tablet Take 325 mg by mouth daily.    Cholecalciferol (VITAMIN D3 PO) Take daily by mouth.   citalopram (CELEXA) 40 MG tablet TAKE 1 TABLET BY MOUTH EVERY DAY   clonazePAM (KLONOPIN) 0.5 MG tablet TAKE 1 TABLET (0.5 MG TOTAL) BY MOUTH EVERY 8 HOURS AS NEEDED FOR ANXIETY.   Cyanocobalamin (VITAMIN B-12 PO) Take by mouth daily.   fluticasone (FLONASE) 50 MCG/ACT nasal spray SPRAY 2 SPRAYS INTO EACH NOSTRIL EVERY DAY   HYDROcodone-acetaminophen (NORCO) 10-325 MG tablet Take 1 tablet by mouth every 6 (six) hours as needed.   metoprolol succinate (TOPROL-XL) 50 MG 24 hr tablet Take 1 tablet (50 mg total) by mouth every evening. Take with or immediately following a meal.   omeprazole (PRILOSEC) 40 MG capsule TAKE 1 CAPSULE (40 MG TOTAL) BY MOUTH DAILY.   pravastatin (PRAVACHOL) 40 MG tablet TAKE 1 TABLET BY MOUTH EVERY DAY   sildenafil (REVATIO) 20 MG tablet TAKE 1-2 TABLET BY MOUTH EVERY DAILY   spironolactone (ALDACTONE) 25 MG tablet TAKE 1 TABLET BY MOUTH EVERY DAY   valsartan-hydrochlorothiazide (DIOVAN-HCT) 160-25 MG tablet Take 1 tablet by mouth daily. Take in place  of atacand-hctz   predniSONE (DELTASONE) 10 MG tablet Take 31m PO daily x2d, then 566mdaily x2d, then 4024maily x2d, then 85m42mily x2d, then 20mg40mly x2d, then 10mg 41my x2d, then stop (Patient not taking: Reported on 01/23/2021)   No facility-administered medications prior to visit.    Review of Systems  Constitutional: Negative.   Respiratory: Negative.    Cardiovascular: Negative.   Gastrointestinal:  Positive for abdominal pain. Negative for abdominal distention, anal bleeding, blood in stool, constipation, diarrhea, nausea, rectal pain and vomiting.   Neurological:  Negative for dizziness, speech difficulty, weakness, light-headedness, numbness and headaches.      Objective    BP (!) 135/94 (BP Location: Left Arm, Patient Position: Sitting, Cuff Size: Large)    Pulse 78    Temp 98.2 F (36.8 C) (Oral)    Wt 193 lb (87.5 kg)    SpO2 95%    BMI 28.50 kg/m    Physical Exam   General: Appearance:     Well developed, well nourished male in no acute distress  Eyes:    PERRL, conjunctiva/corneas clear, EOM's intact       Lungs:     Clear to auscultation bilaterally, respirations unlabored  Heart:    Normal heart rate. Normal rhythm. No murmurs, rubs, or gallops.    MS:   All extremities are intact.    Neurologic:   Awake, alert, oriented x 3. No apparent focal neurological defect.        Depression screen PHQ 2/[facility]/13/2023 07/15/2020 07/08/2020  Decreased Interest 0 0 0  Down, Depressed, Hopeless 0 0 0  PHQ - 2 Score 0 0 0  Altered sleeping 1 0 0  Tired, decreased energy 0 0 0  Change in appetite 0 0 0  Feeling bad or failure about yourself  0 0 0  Trouble concentrating 0 0 0  Moving slowly or fidgety/restless 0 0 0  Suicidal thoughts 0 0 0  PHQ-9 Score 1 0 0  Difficult doing work/chores - Not difficult at all Not difficult at all      Assessment & Plan     1. Essential (primary) hypertension Doing well current medications.   2. Chronic bilateral low back pain with right-sided sciatica Had been well controlled, but only tolerates certain generic brands. Written 15 day supply to get by until his next full prescription is due which his pharmacy told him they could get the brand that he does tolerate.  - HYDROcodone-acetaminophen (NORCO) 10-325 MG tablet; Take 1 tablet by mouth every 6 (six) hours as needed.  Dispense: 60 tablet; Refill: 0  3. Depression, recurrent (HCC) DNanceg well current medication regiment.         The entirety of the information documented in the History of Present Illness, Review of Systems and  Physical Exam were personally obtained by me. Portions of this information were initially documented by the CMA and reviewed by me for thoroughness and accuracy.     DonaldLelon HuhBurlinSaint ALPh[facility] [phone]) [phone]  [facility] [place]

## 2021-01-26 ENCOUNTER — Other Ambulatory Visit: Payer: Self-pay | Admitting: Family Medicine

## 2021-01-26 DIAGNOSIS — G4700 Insomnia, unspecified: Secondary | ICD-10-CM

## 2021-01-26 NOTE — Telephone Encounter (Signed)
LOV:  01/23/2021   Thanks,   -Mickel Baas

## 2021-01-26 NOTE — Telephone Encounter (Signed)
Requested medication (s) are due for refill today: yes  Requested medication (s) are on the active medication list: yes  Last refill:  04/06/20 #90 3 refills  Future visit scheduled: no   Notes to clinic:  not delegated per protocol . Do you want to refill Rx?     Requested Prescriptions  Pending Prescriptions Disp Refills   clonazePAM (KLONOPIN) 0.5 MG tablet [Pharmacy Med Name: CLONAZEPAM 0.5 MG TABLET] 90 tablet     Sig: TAKE 1 TABLET BY MOUTH EVERY 8 HOURS AS NEEDED FOR ANXIETY.     Not Delegated - Psychiatry:  Anxiolytics/Hypnotics Failed - 01/26/2021  3:27 PM      Failed - This refill cannot be delegated      Failed - Urine Drug Screen completed in last 360 days      Passed - Valid encounter within last 6 months    Recent Outpatient Visits           3 days ago Essential (primary) hypertension   [facility] Birdie Sons, MD   1 month ago Chronic right-sided low back pain with right-sided sciatica   [facility] [place], Dionne Bucy, MD   4 months ago Acute right-sided low back pain with right-sided sciatica   [facility] Birdie Sons, MD   6 months ago Essential (primary) hypertension   [facility] Birdie Sons, MD   7 months ago No-show for appointment   [facility] Birdie Sons, MD

## 2021-02-02 ENCOUNTER — Ambulatory Visit: Payer: Self-pay

## 2021-02-02 DIAGNOSIS — R197 Diarrhea, unspecified: Secondary | ICD-10-CM

## 2021-02-02 NOTE — Telephone Encounter (Signed)
2nd attempt- Pt called, unable to leave VM d/t mailbox is full. Will attempt again later.

## 2021-02-02 NOTE — Telephone Encounter (Signed)
Pt has questions and concerns of having parasites. Pt requests call back. Cb# [phone]    Pt.'s voice mailbox is full, unable to leave a message.

## 2021-02-02 NOTE — Telephone Encounter (Signed)
3rd attempt, pt called.  Unable to leave VM d/t mailbox full. Unable to reach patient after 3 attempts by [facility] NT, routing to the provider for resolution per protocol.

## 2021-02-03 ENCOUNTER — Other Ambulatory Visit: Payer: Self-pay | Admitting: Family Medicine

## 2021-02-03 NOTE — Addendum Note (Signed)
Addended by: Birdie Sons on: 02/03/2021 03:08 PM   Modules accepted: Orders

## 2021-02-03 NOTE — Telephone Encounter (Signed)
There's no blood test for that, but could be detected with a stool test. I can put in an order if he wants [place] to check.

## 2021-02-03 NOTE — Telephone Encounter (Signed)
Ok, please print order and leave for him at the lab. He will need to stop by the lab to pick up stool collection kit.

## 2021-02-03 NOTE — Telephone Encounter (Signed)
Patient advised. He would like to proceed with the stool test to check for parasites.

## 2021-02-09 ENCOUNTER — Other Ambulatory Visit: Payer: Self-pay | Admitting: Family Medicine

## 2021-02-09 DIAGNOSIS — R197 Diarrhea, unspecified: Secondary | ICD-10-CM | POA: Diagnosis not present

## 2021-02-10 DIAGNOSIS — M5416 Radiculopathy, lumbar region: Secondary | ICD-10-CM | POA: Diagnosis not present

## 2021-02-10 DIAGNOSIS — M5136 Other intervertebral disc degeneration, lumbar region: Secondary | ICD-10-CM | POA: Diagnosis not present

## 2021-02-10 DIAGNOSIS — Z9889 Other specified postprocedural states: Secondary | ICD-10-CM | POA: Diagnosis not present

## 2021-02-10 NOTE — Addendum Note (Signed)
Addended by: Birdie Sons on: 02/10/2021 08:18 AM   Modules accepted: Orders

## 2021-02-11 ENCOUNTER — Other Ambulatory Visit (HOSPITAL_COMMUNITY): Payer: Self-pay | Admitting: Neurosurgery

## 2021-02-11 ENCOUNTER — Other Ambulatory Visit: Payer: Self-pay | Admitting: Neurosurgery

## 2021-02-11 DIAGNOSIS — Z9889 Other specified postprocedural states: Secondary | ICD-10-CM

## 2021-02-11 DIAGNOSIS — M5136 Other intervertebral disc degeneration, lumbar region: Secondary | ICD-10-CM

## 2021-02-11 DIAGNOSIS — M5416 Radiculopathy, lumbar region: Secondary | ICD-10-CM

## 2021-02-12 ENCOUNTER — Ambulatory Visit (INDEPENDENT_AMBULATORY_CARE_PROVIDER_SITE_OTHER): Payer: Medicare Other

## 2021-02-12 ENCOUNTER — Other Ambulatory Visit: Payer: Self-pay

## 2021-02-12 VITALS — BP 110/70 | HR 49 | Temp 98.1°F | Ht 68.0 in | Wt 196.3 lb

## 2021-02-12 DIAGNOSIS — H509 Unspecified strabismus: Secondary | ICD-10-CM | POA: Insufficient documentation

## 2021-02-12 DIAGNOSIS — Z0000 Encounter for general adult medical examination without abnormal findings: Secondary | ICD-10-CM

## 2021-02-12 NOTE — Progress Notes (Signed)
Subjective:   Gary Carter is a 71 y.o. male who presents for Medicare Annual/Subsequent preventive examination.  Review of Systems           Objective:    Today's Vitals   02/12/21 1052 02/12/21 1059  BP: 110/70   Pulse: (!) 49   Temp: 98.1 F (36.7 C)   TempSrc: Oral   SpO2: 97%   Weight: 196 lb 4.8 oz (89 kg)   Height: 5\' 8"  (1.727 m)   PainSc:  4    Body mass index is 29.85 kg/m.  Advanced Directives 03/22/2020 05/15/2019 07/10/2018  Does Patient Have a Medical Advance Directive? No Yes No  Type of Advance Directive - [place];Living will -  Copy of [place] in Chart? - No - copy requested -  Would patient like information on creating a medical advance directive? - - No - Patient declined    Current Medications (verified) Outpatient Encounter Medications as of 02/12/2021  Medication Sig   clonazePAM (KLONOPIN) 0.5 MG tablet Take by mouth.   meloxicam (MOBIC) 7.5 MG tablet Take by mouth.   amLODipine (NORVASC) 10 MG tablet TAKE 1 TABLET BY MOUTH EVERY DAY   aspirin 325 MG tablet Take 325 mg by mouth daily.    Cholecalciferol (VITAMIN D3 PO) Take daily by mouth.   citalopram (CELEXA) 40 MG tablet TAKE 1 TABLET BY MOUTH EVERY DAY   Cyanocobalamin (VITAMIN B-12 PO) Take by mouth daily.   fluticasone (FLONASE) 50 MCG/ACT nasal spray SPRAY 2 SPRAYS INTO EACH NOSTRIL EVERY DAY   HYDROcodone-acetaminophen (NORCO) 10-325 MG tablet Take 1 tablet by mouth every 6 (six) hours as needed.   metoprolol succinate (TOPROL-XL) 50 MG 24 hr tablet Take 1 tablet (50 mg total) by mouth every evening. Take with or immediately following a meal.   omeprazole (PRILOSEC) 40 MG capsule TAKE 1 CAPSULE (40 MG TOTAL) BY MOUTH DAILY.   pravastatin (PRAVACHOL) 40 MG tablet TAKE 1 TABLET BY MOUTH EVERY DAY   sildenafil (REVATIO) 20 MG tablet TAKE 1-2 TABLET BY MOUTH EVERY DAILY   spironolactone (ALDACTONE) 25 MG tablet TAKE 1 TABLET BY MOUTH EVERY DAY    valsartan-hydrochlorothiazide (DIOVAN-HCT) 160-25 MG tablet Take 1 tablet by mouth daily. Take in place of atacand-hctz   Vitamin E (VITAMIN E/D-ALPHA NATURAL) 268 MG (400 UNIT) CAPS Take by mouth.   [DISCONTINUED] clonazePAM (KLONOPIN) 0.5 MG tablet TAKE 1 TABLET BY MOUTH EVERY 8 HOURS AS NEEDED FOR ANXIETY.   [DISCONTINUED] POTASSIUM PO Take daily by mouth.   No facility-administered encounter medications on file as of 02/12/2021.    Allergies (verified) Hctz [hydrochlorothiazide]   History: Past Medical History:  Diagnosis Date   Anxiety    Barrett's esophagus    GERD (gastroesophageal reflux disease)    History of chicken pox 06/12/2014   DID have Chicken Pox. DID have Measles. DID have Mumps. DID have Rubella.     History of dysphagia    History of fatty infiltration of liver    History of measles    History of mumps    History of rubella    Hyperlipidemia    Hypertension    Sleep apnea    SOB (shortness of breath)    Past Surgical History:  Procedure Laterality Date   ABDOMINAL SURGERY     BACK SURGERY  12-04-2003   Lumbar fusion & C5-C6 fusion   ESOPHAGOGASTRODUODENOSCOPY (EGD) WITH PROPOFOL N/A 07/10/2018   Procedure: ESOPHAGOGASTRODUODENOSCOPY (EGD) WITH PROPOFOL;  Surgeon: Manya Silvas, MD;  Location: [facility] ENDOSCOPY;  Service: Endoscopy;  Laterality: N/A;   HERNIA REPAIR     Myocardial perfusion scan  10/10/2006   NECK SURGERY     NERVE, TENDON AND ARTERY REPAIR Left 10/18/2012   Procedure: NERVE, TENDON AND ARTERY REPAIR;  Surgeon: Linna Hoff, MD;  Location: [facility] [place];  Service: Orthopedics;  Laterality: Left;   NISSEN FUNDOPLICATION  [id number]   UPPER GI ENDOSCOPY  04/09/2007   Dr. Tiffany Kocher; Mucosal changes suspicious for Barretts esophagits   WOUND EXPLORATION Left 10/18/2012   Procedure: WOUND EXPLORATION;  Surgeon: Linna Hoff, MD;  Location: [place];  Service: Orthopedics;  Laterality: Left;   Family History  Problem Relation Age of Onset   Hypertension Sister     Bipolar disorder Sister    Heart attack Father 80   Emphysema Mother    Heart disease Mother    Diabetes Daughter 3   Bipolar disorder Brother    Hypertension Brother    Obesity Brother    Social History   Socioeconomic History   Marital status: Divorced    Spouse name: Not on file   Number of children: 1   Years of education: Not on file   Highest education level: High school graduate  Occupational History   Occupation: Works at [employer]  Tobacco Use   Smoking status: Every Day    Packs/day: 0.50    Years: 5.00    Pack years: 2.50    Types: Cigarettes   Smokeless tobacco: Never  Vaping Use   Vaping Use: Never used  Substance and Sexual Activity   Alcohol use: Yes    Alcohol/week: 8.0 standard drinks    Types: 8 Shots of liquor per week    Comment: 2 drinks 4 nights a week   Drug use: Not Currently    Comment: previously smoked marijuana years ago   Sexual activity: Not on file  Other Topics Concern   Not on file  Social History Narrative   Not on file   Social Determinants of Health   Financial Resource Strain: Not on file  Food Insecurity: Not on file  Transportation Needs: Not on file  Physical Activity: Not on file  Stress: Not on file  Social Connections: Not on file    Tobacco Counseling Ready to quit: Not Answered Counseling given: Not Answered   Clinical Intake:  Pre-visit preparation completed: Yes  Pain : 0-10 Pain Score: 4  Pain Type: Acute pain Pain Location: Shoulder Pain Orientation: Right Pain Radiating Towards: mid arm and chest Pain Descriptors / Indicators: Aching, Burning, Dull Pain Onset: In the past 7 days Pain Frequency: Several days a week     Nutritional Status: BMI 25 -29 Overweight Nutritional Risks: None Diabetes: No  How often do you need to have someone help you when you read instructions, pamphlets, or other written materials from your doctor or pharmacy?: 1 - Never  Diabetic?no  Interpreter Needed?:  No  Information entered by :: Kirke Shaggy, LPN   Activities of Daily Living In your present state of health, do you have any difficulty performing the following activities: 07/15/2020 07/08/2020  Hearing? N N  Vision? N N  Difficulty concentrating or making decisions? N N  Walking or climbing stairs? N N  Dressing or bathing? N N  Doing errands, shopping? N N  Some recent data might be hidden    Patient Care Team: Birdie Sons, MD as PCP - General (Family Medicine)  Indicate any recent Medical Services you may have received from other than [facility] providers in the past year (date may be approximate).     Assessment:   This is a routine wellness examination for Gary Carter.  Hearing/Vision screen No results found.  Dietary issues and exercise activities discussed:     Goals Addressed   None    Depression Screen PHQ 2/9 Scores 01/23/2021 07/15/2020 07/08/2020 02/26/2020 05/15/2019 02/14/2018 02/07/2017  PHQ - 2 Score 0 0 0 2 0 0 0  PHQ- 9 Score 1 0 0 3 - 1 5    Fall Risk Fall Risk  01/23/2021 07/15/2020 07/08/2020 02/26/2020 05/15/2019  Falls in the past year? 0 0 0 0 0  Number falls in past yr: 0 0 0 0 0  Injury with Fall? - 0 0 0 0  Risk for fall due to : - - No Fall Risks - -  Follow up - Falls evaluation completed Falls evaluation completed Falls evaluation completed -    FALL RISK PREVENTION PERTAINING TO THE HOME:  Any stairs in or around the home? Yes  If so, are there any without handrails? No  Home free of loose throw rugs in walkways, pet beds, electrical cords, etc? Yes  Adequate lighting in your home to reduce risk of falls? Yes   ASSISTIVE DEVICES UTILIZED TO PREVENT FALLS:  Life alert? No  Use of a cane, walker or w/c? No  Grab bars in the bathroom? No  Shower chair or bench in shower? No  Elevated toilet seat or a handicapped toilet? No   TIMED UP AND GO:  Was the test performed? Yes .  Length of time to ambulate 10 feet: 4 sec.   Gait steady and fast  without use of assistive device  Cognitive Function:        Immunizations Immunization History  Administered Date(s) Administered   Fluad Quad(high Dose 65+) 11/27/2019, 09/16/2020   Influenza Split 10/08/2011   Influenza, High Dose Seasonal PF 09/16/2017   Influenza,inj,Quad PF,6+ Mos 10/09/2013, 09/14/2016   Influenza-Unspecified 10/29/2014, 09/16/2017, 10/01/2018   Moderna Sars-Covid-2 Vaccination 11/27/2019   Pneumococcal Conjugate-13 11/15/2017   Pneumococcal Polysaccharide-23 10/08/2011, 05/18/2019   Tdap 10/08/2011   Zoster Recombinat (Shingrix) 02/07/2017, 04/21/2017   Zoster, Live 10/09/2013    TDAP status: Up to date  Flu Vaccine status: Up to date  Pneumococcal vaccine status: Up to date  Covid-19 vaccine status: Completed vaccines  Qualifies for Shingles Vaccine? Yes   Zostavax completed Yes   Shingrix Completed?: Yes  Screening Tests Health Maintenance  Topic Date Due   COVID-19 Vaccine (2 - Moderna risk series) 12/25/2019   COLONOSCOPY (Pts 45-41yrs Insurance coverage will need to be confirmed)  06/17/2021   TETANUS/TDAP  10/07/2021   Pneumonia Vaccine 19+ Years old  Completed   INFLUENZA VACCINE  Completed   Hepatitis C Screening  Completed   Zoster Vaccines- Shingrix  Completed   HPV VACCINES  Aged Out    Health Maintenance  Health Maintenance Due  Topic Date Due   COVID-19 Vaccine (2 - Moderna risk series) 12/25/2019    Colorectal cancer screening: Type of screening: Colonoscopy. Completed scheduled at end of this month. Repeat every 10 years  Lung Cancer Screening: (Low Dose CT Chest recommended if Age 5-80 years, 30 pack-year currently smoking OR have quit w/in 15years.) does not qualify.    Additional Screening:  Hepatitis C Screening: does qualify; Completed 02/07/17  Vision Screening: Recommended annual ophthalmology exams for early detection of glaucoma  and other disorders of the eye. Is the patient up to date with their annual  eye exam?  Yes  Who is the provider or what is the name of the office in which the patient attends annual eye exams? Dr.Thurmond If pt is not established with a provider, would they like to be referred to a provider to establish care? No .   Dental Screening: Recommended annual dental exams for proper oral hygiene  Community Resource Referral / Chronic Care Management: CRR required this visit?  No   CCM required this visit?  No      Plan:     I have personally reviewed and noted the following in the patients chart:   Medical and social history Use of alcohol, tobacco or illicit drugs  Current medications and supplements including opioid prescriptions. Patient is currently taking opioid prescriptions. Information provided to patient regarding non-opioid alternatives. Patient advised to discuss non-opioid treatment plan with their provider. Functional ability and status Nutritional status Physical activity Advanced directives List of other physicians Hospitalizations, surgeries, and ER visits in previous 12 months Vitals Screenings to include cognitive, depression, and falls Referrals and appointments  In addition, I have reviewed and discussed with patient certain preventive protocols, quality metrics, and best practice recommendations. A written personalized care plan for preventive services as well as general preventive health recommendations were provided to patient.     Dionisio David, LPN   03/18/3777   Nurse Notes: none

## 2021-02-12 NOTE — Patient Instructions (Signed)
Gary Carter , Thank you for taking time to come for your Medicare Wellness Visit. I appreciate your ongoing commitment to your health goals. Please review the following plan we discussed and let me know if I can assist you in the future.   Screening recommendations/referrals: Colonoscopy: 06/18/11, scheduled for one at end of this month Recommended yearly ophthalmology/optometry visit for glaucoma screening and checkup Recommended yearly dental visit for hygiene and checkup  Vaccinations: Influenza vaccine: 09/16/20 Pneumococcal vaccine: 05/18/19 Tdap vaccine: 10/08/11 Shingles vaccine: Shingrix 02/07/17, 04/21/17   Covid-19: 11/27/19, had 2 other shots, no dates  Advanced directives: no  Conditions/risks identified: none  Next appointment: Follow up in one year for your annual wellness visit. 02/16/22 @ 1pm in person  Preventive Care 65 Years and Older, Male Preventive care refers to lifestyle choices and visits with your health care provider that can promote health and wellness. What does preventive care include? A yearly physical exam. This is also called an annual well check. Dental exams once or twice a year. Routine eye exams. Ask your health care provider how often you should have your eyes checked. Personal lifestyle choices, including: Daily care of your teeth and gums. Regular physical activity. Eating a healthy diet. Avoiding tobacco and drug use. Limiting alcohol use. Practicing safe sex. Taking low doses of aspirin every day. Taking vitamin and mineral supplements as recommended by your health care provider. What happens during an annual well check? The services and screenings done by your health care provider during your annual well check will depend on your age, overall health, lifestyle risk factors, and family history of disease. Counseling  Your health care provider may ask you questions about your: Alcohol use. Tobacco use. Drug use. Emotional well-being. Home and  relationship well-being. Sexual activity. Eating habits. History of falls. Memory and ability to understand (cognition). Work and work Statistician. Screening  You may have the following tests or measurements: Height, weight, and BMI. Blood pressure. Lipid and cholesterol levels. These may be checked every 5 years, or more frequently if you are over 55 years old. Skin check. Lung cancer screening. You may have this screening every year starting at age 74 if you have a 30-pack-year history of smoking and currently smoke or have quit within the past 15 years. Fecal occult blood test (FOBT) of the stool. You may have this test every year starting at age 24. Flexible sigmoidoscopy or colonoscopy. You may have a sigmoidoscopy every 5 years or a colonoscopy every 10 years starting at age 39. Prostate cancer screening. Recommendations will vary depending on your family history and other risks. Hepatitis C blood test. Hepatitis B blood test. Sexually transmitted disease (STD) testing. Diabetes screening. This is done by checking your blood sugar (glucose) after you have not eaten for a while (fasting). You may have this done every 1-3 years. Abdominal aortic aneurysm (AAA) screening. You may need this if you are a current or former smoker. Osteoporosis. You may be screened starting at age 49 if you are at high risk. Talk with your health care provider about your test results, treatment options, and if necessary, the need for more tests. Vaccines  Your health care provider may recommend certain vaccines, such as: Influenza vaccine. This is recommended every year. Tetanus, diphtheria, and acellular pertussis (Tdap, Td) vaccine. You may need a Td booster every 10 years. Zoster vaccine. You may need this after age 69. Pneumococcal 13-valent conjugate (PCV13) vaccine. One dose is recommended after age 35. Pneumococcal polysaccharide (PPSV23)  vaccine. One dose is recommended after age 79. Talk to your  health care provider about which screenings and vaccines you need and how often you need them. This information is not intended to replace advice given to you by your health care provider. Make sure you discuss any questions you have with your health care provider. Document Released: 01/24/2015 Document Revised: 09/17/2015 Document Reviewed: 10/29/2014 Elsevier Interactive Patient Education  2017 [place] Prevention in the Home Falls can cause injuries. They can happen to people of all ages. There are many things you can do to make your home safe and to help prevent falls. What can I do on the outside of my home? Regularly fix the edges of walkways and driveways and fix any cracks. Remove anything that might make you trip as you walk through a door, such as a raised step or threshold. Trim any bushes or trees on the path to your home. Use bright outdoor lighting. Clear any walking paths of anything that might make someone trip, such as rocks or tools. Regularly check to see if handrails are loose or broken. Make sure that both sides of any steps have handrails. Any raised decks and porches should have guardrails on the edges. Have any leaves, snow, or ice cleared regularly. Use sand or salt on walking paths during winter. Clean up any spills in your garage right away. This includes oil or grease spills. What can I do in the bathroom? Use night lights. Install grab bars by the toilet and in the tub and shower. Do not use towel bars as grab bars. Use non-skid mats or decals in the tub or shower. If you need to sit down in the shower, use a plastic, non-slip stool. Keep the floor dry. Clean up any water that spills on the floor as soon as it happens. Remove soap buildup in the tub or shower regularly. Attach bath mats securely with double-sided non-slip rug tape. Do not have throw rugs and other things on the floor that can make you trip. What can I do in the bedroom? Use night  lights. Make sure that you have a light by your bed that is easy to reach. Do not use any sheets or blankets that are too big for your bed. They should not hang down onto the floor. Have a firm chair that has side arms. You can use this for support while you get dressed. Do not have throw rugs and other things on the floor that can make you trip. What can I do in the kitchen? Clean up any spills right away. Avoid walking on wet floors. Keep items that you use a lot in easy-to-reach places. If you need to reach something above you, use a strong step stool that has a grab bar. Keep electrical cords out of the way. Do not use floor polish or wax that makes floors slippery. If you must use wax, use non-skid floor wax. Do not have throw rugs and other things on the floor that can make you trip. What can I do with my stairs? Do not leave any items on the stairs. Make sure that there are handrails on both sides of the stairs and use them. Fix handrails that are broken or loose. Make sure that handrails are as long as the stairways. Check any carpeting to make sure that it is firmly attached to the stairs. Fix any carpet that is loose or worn. Avoid having throw rugs at the top or bottom  of the stairs. If you do have throw rugs, attach them to the floor with carpet tape. Make sure that you have a light switch at the top of the stairs and the bottom of the stairs. If you do not have them, ask someone to add them for you. What else can I do to help prevent falls? Wear shoes that: Do not have high heels. Have rubber bottoms. Are comfortable and fit you well. Are closed at the toe. Do not wear sandals. If you use a stepladder: Make sure that it is fully opened. Do not climb a closed stepladder. Make sure that both sides of the stepladder are locked into place. Ask someone to hold it for you, if possible. Clearly mark and make sure that you can see: Any grab bars or handrails. First and last  steps. Where the edge of each step is. Use tools that help you move around (mobility aids) if they are needed. These include: Canes. Walkers. Scooters. Crutches. Turn on the lights when you go into a dark area. Replace any light bulbs as soon as they burn out. Set up your furniture so you have a clear path. Avoid moving your furniture around. If any of your floors are uneven, fix them. If there are any pets around you, be aware of where they are. Review your medicines with your doctor. Some medicines can make you feel dizzy. This can increase your chance of falling. Ask your doctor what other things that you can do to help prevent falls. This information is not intended to replace advice given to you by your health care provider. Make sure you discuss any questions you have with your health care provider. Document Released: 10/24/2008 Document Revised: 06/05/2015 Document Reviewed: 02/01/2014 Elsevier Interactive Patient Education  2017 [employer].

## 2021-02-14 LAB — OVA AND PARASITE EXAMINATION

## 2021-02-14 LAB — STOOL CULTURE: E coli, Shiga toxin Assay: NEGATIVE

## 2021-02-14 LAB — CLOSTRIDIUM DIFFICILE BY PCR: Toxigenic C. Difficile by PCR: NEGATIVE

## 2021-02-14 LAB — C DIFFICILE, CYTOTOXIN B

## 2021-02-14 LAB — C DIFFICILE TOXINS A+B W/RFLX: C difficile Toxins A+B, EIA: NEGATIVE

## 2021-02-14 LAB — FECAL LEUKOCYTES

## 2021-02-16 ENCOUNTER — Other Ambulatory Visit: Payer: Self-pay | Admitting: Family Medicine

## 2021-02-16 ENCOUNTER — Ambulatory Visit: Payer: Self-pay

## 2021-02-16 ENCOUNTER — Telehealth: Payer: Self-pay

## 2021-02-16 DIAGNOSIS — M5441 Lumbago with sciatica, right side: Secondary | ICD-10-CM

## 2021-02-16 DIAGNOSIS — G8929 Other chronic pain: Secondary | ICD-10-CM

## 2021-02-16 NOTE — Telephone Encounter (Signed)
Copied from [facility] [id number]. Topic: General - Call Back - No Documentation >> Feb 12, 2021  3:11 PM Scherrie Gerlach wrote: Reason for CRM: pt would like a call back about his lab results

## 2021-02-16 NOTE — Telephone Encounter (Signed)
Requested medications are due for refill today.  yes  Requested medications are on the active medications list.  yes  Last refill. 01/23/2021  Future visit scheduled.   no  Notes to clinic.  Medication not delegated.    Requested Prescriptions  Pending Prescriptions Disp Refills   HYDROcodone-acetaminophen (NORCO) 10-325 MG tablet 60 tablet 0    Sig: Take 1 tablet by mouth every 6 (six) hours as needed.     Not Delegated - Analgesics:  Opioid Agonist Combinations Failed - 02/16/2021  5:58 PM      Failed - This refill cannot be delegated      Failed - Urine Drug Screen completed in last 360 days      Passed - Valid encounter within last 3 months    Recent Outpatient Visits           3 weeks ago Essential (primary) hypertension   [facility] Birdie Sons, MD   2 months ago Chronic right-sided low back pain with right-sided sciatica   [facility] [place], Dionne Bucy, MD   5 months ago Acute right-sided low back pain with right-sided sciatica   [facility] Birdie Sons, MD   7 months ago Essential (primary) hypertension   [facility] Birdie Sons, MD   8 months ago No-show for appointment   [facility] Birdie Sons, MD

## 2021-02-16 NOTE — Telephone Encounter (Signed)
Medication Refill - Medication: HYDROcodone-acetaminophen (NORCO) 10-325 MG tablet  Has the patient contacted their pharmacy?  no (He calls in every 2 weeks Preferred Pharmacy (with phone number or street name): [id number] - [place], [place]  Has the patient been seen for an appointment in the last year OR does the patient have an upcoming appointment? Yes.    Agent: Please be advised that RX refills may take up to 3 business days. We ask that you follow-up with your pharmacy.

## 2021-02-16 NOTE — Telephone Encounter (Signed)
° °  Chief Complaint: Neck pain Symptoms: Radiates to right shoulder and arm Frequency: Started 1-2 weeks ago Pertinent Negatives: Patient denies weakness Disposition: [] ED /[] Urgent Care (no appt availability in office) / [x] Appointment(In office/virtual)/ []  [facility]/ [] Home Care/ [] Refused Recommended Disposition /[] [place] [facility]/ []  Follow-up with PCP Additional Notes:    Reason for Disposition  [1] MODERATE neck pain (e.g., interferes with normal activities) AND [2] present > 3 days  Answer Assessment - Initial Assessment Questions 1. ONSET: "When did the pain begin?"      1-2 weeks 2. LOCATION: "Where does it hurt?"      Neck pain 3. PATTERN "Does the pain come and go, or has it been constant since it started?"      Constant 4. SEVERITY: "How bad is the pain?"  (Scale 1-10; or mild, moderate, severe)   - NO PAIN (0): no pain or only slight stiffness    - MILD (1-3): doesn't interfere with normal activities    - MODERATE (4-7): interferes with normal activities or awakens from sleep    - SEVERE (8-10):  excruciating pain, unable to do any normal activities      10 5. RADIATION: "Does the pain go anywhere else, shoot into your arms?"     Right arm,shoulder 6. CORD SYMPTOMS: "Any weakness or numbness of the arms or legs?"     No 7. CAUSE: "What do you think is causing the neck pain?"     Nerve 8. NECK OVERUSE: "Any recent activities that involved turning or twisting the neck?"     No 9. OTHER SYMPTOMS: "Do you have any other symptoms?" (e.g., headache, fever, chest pain, difficulty breathing, neck swelling)     No 10. PREGNANCY: "Is there any chance you are pregnant?" "When was your last menstrual period?"       N/a  Protocols used: Neck Pain or Stiffness-A-AH

## 2021-02-17 ENCOUNTER — Ambulatory Visit
Admission: RE | Admit: 2021-02-17 | Discharge: 2021-02-17 | Disposition: A | Discharge status: Home / Self Care | Payer: Medicare Other | Attending: Family Medicine | Admitting: Family Medicine

## 2021-02-17 ENCOUNTER — Telehealth: Payer: Self-pay | Admitting: Family Medicine

## 2021-02-17 ENCOUNTER — Encounter: Payer: Self-pay | Admitting: Family Medicine

## 2021-02-17 ENCOUNTER — Ambulatory Visit (INDEPENDENT_AMBULATORY_CARE_PROVIDER_SITE_OTHER): Payer: Medicare Other | Admitting: Family Medicine

## 2021-02-17 ENCOUNTER — Ambulatory Visit
Admission: RE | Admit: 2021-02-17 | Discharge: 2021-02-17 | Disposition: A | Discharge status: Home / Self Care | Payer: Medicare Other | Source: Ambulatory Visit | Attending: Family Medicine | Admitting: Family Medicine

## 2021-02-17 ENCOUNTER — Other Ambulatory Visit: Payer: Self-pay

## 2021-02-17 VITALS — BP 129/79 | HR 63 | Temp 98.0°F | Resp 18 | Wt 198.0 lb

## 2021-02-17 DIAGNOSIS — R0789 Other chest pain: Secondary | ICD-10-CM

## 2021-02-17 DIAGNOSIS — M542 Cervicalgia: Secondary | ICD-10-CM | POA: Diagnosis not present

## 2021-02-17 DIAGNOSIS — R200 Anesthesia of skin: Secondary | ICD-10-CM | POA: Insufficient documentation

## 2021-02-17 DIAGNOSIS — M25511 Pain in right shoulder: Secondary | ICD-10-CM

## 2021-02-17 DIAGNOSIS — M2578 Osteophyte, vertebrae: Secondary | ICD-10-CM | POA: Diagnosis not present

## 2021-02-17 MED ORDER — HYDROCODONE-ACETAMINOPHEN 10-325 MG PO TABS: 1.0000 | ORAL_TABLET | Freq: Four times a day (QID) | ORAL | 0 refills | Status: DC | PRN

## 2021-02-17 MED ORDER — PREDNISONE 10 MG PO TABS: ORAL_TABLET | ORAL | 0 refills | Status: DC

## 2021-02-17 NOTE — Addendum Note (Signed)
Addended by: Birdie Sons on: 02/17/2021 07:59 AM   Modules accepted: Orders

## 2021-02-17 NOTE — Telephone Encounter (Signed)
Patient advised.

## 2021-02-17 NOTE — Telephone Encounter (Signed)
Still waiting for radiologist to read xray, but everything looks stable to me. Have sent prescription for prednisone for him to start taking. Should have official reading by tomorrow.

## 2021-02-17 NOTE — Progress Notes (Signed)
I,Roshena L Chambers,acting as a scribe for Lelon Huh, MD.,have documented all relevant documentation on the behalf of Lelon Huh, MD,as directed by  Lelon Huh, MD while in the presence of Lelon Huh, MD.   Established patient visit   Patient: Gary Carter   DOB: 1950/10/11   71 y.o. Male  MRN: [id number] Visit Date: 02/17/2021  Today's healthcare provider: Lelon Huh, MD   Chief Complaint  Patient presents with   Neck Pain   Subjective    Neck Pain  This is a new problem. Episode onset: 1-2 weeks ago. The problem occurs intermittently. The problem has been unchanged. The pain is present in the right side (radiates to right shoulder and arm). Quality: throbbing and aching. Exacerbated by: sitting. Associated symptoms include chest pain and numbness (in right hand). Pertinent negatives include no fever or weakness.     Medications: Outpatient Medications Prior to Visit  Medication Sig   amLODipine (NORVASC) 10 MG tablet TAKE 1 TABLET BY MOUTH EVERY DAY   aspirin 325 MG tablet Take 325 mg by mouth daily.    Cholecalciferol (VITAMIN D3 PO) Take daily by mouth. (Patient not taking: Reported on 02/12/2021)   citalopram (CELEXA) 40 MG tablet TAKE 1 TABLET BY MOUTH EVERY DAY   clonazePAM (KLONOPIN) 0.5 MG tablet Take by mouth.   Cyanocobalamin (VITAMIN B-12 PO) Take by mouth daily. (Patient not taking: Reported on 02/12/2021)   fluticasone (FLONASE) 50 MCG/ACT nasal spray SPRAY 2 SPRAYS INTO EACH NOSTRIL EVERY DAY   HYDROcodone-acetaminophen (NORCO) 10-325 MG tablet Take 1 tablet by mouth every 6 (six) hours as needed.   meloxicam (MOBIC) 7.5 MG tablet Take by mouth.   metoprolol succinate (TOPROL-XL) 50 MG 24 hr tablet Take 1 tablet (50 mg total) by mouth every evening. Take with or immediately following a meal.   omeprazole (PRILOSEC) 40 MG capsule TAKE 1 CAPSULE (40 MG TOTAL) BY MOUTH DAILY.   pravastatin (PRAVACHOL) 40 MG tablet TAKE 1 TABLET BY MOUTH EVERY DAY    sildenafil (REVATIO) 20 MG tablet TAKE 1-2 TABLET BY MOUTH EVERY DAILY   spironolactone (ALDACTONE) 25 MG tablet TAKE 1 TABLET BY MOUTH EVERY DAY   valsartan-hydrochlorothiazide (DIOVAN-HCT) 160-25 MG tablet Take 1 tablet by mouth daily. Take in place of atacand-hctz   Vitamin E 268 MG (400 UNIT) CAPS Take by mouth.   No facility-administered medications prior to visit.    Review of Systems  Constitutional:  Negative for appetite change, chills and fever.  Respiratory:  Negative for chest tightness, shortness of breath and wheezing.        Pain during deep breathing  Cardiovascular:  Positive for chest pain. Negative for palpitations.  Gastrointestinal:  Negative for abdominal pain, nausea and vomiting.  Musculoskeletal:  Positive for myalgias (muscle pain on right upper chest wall) and neck pain.  Neurological:  Positive for numbness (in right hand). Negative for weakness.      Objective    BP 129/79 (BP Location: Left Arm, Patient Position: Sitting, Cuff Size: Large)    Pulse 63    Temp 98 F (36.7 C) (Oral)    Resp 18    Wt 198 lb (89.8 kg)    SpO2 96% Comment: room air   BMI 30.11 kg/m  {Show previous vital signs (optional):23777}  Physical Exam   General: Appearance:    Mildly obese male in no acute distress  Eyes:    PERRL, conjunctiva/corneas clear, EOM's intact  Lungs:     Clear to auscultation bilaterally, respirations unlabored  Heart:    Normal heart rate. Normal rhythm. No murmurs, rubs, or gallops.    MS:   All extremities are intact.    Neurologic:   Awake, alert, oriented x 3. Slight weakness of right upper extremity. Limited head rotation due to prior neck surgery. Mild diffuse tenderness of c-spine. No gross deformities. Slightly diminished somatosensation of left upper extremite.         Assessment & Plan     1. Chest discomfort  - EKG 12-Lead (no acute changes)  2. Right arm numbness  - DG Cervical Spine Complete; Future - predniSONE (DELTASONE)  10 MG tablet; 6 tablets for 2 days, then 5 for 2 days, then 4 for 2 days, then 3 for 2 days, then 2 for 2 days, then 1 for 2 days.  Dispense: 42 tablet; Refill: 0  3. Neck pain  - DG Cervical Spine Complete; Future - predniSONE (DELTASONE) 10 MG tablet; 6 tablets for 2 days, then 5 for 2 days, then 4 for 2 days, then 3 for 2 days, then 2 for 2 days, then 1 for 2 days.  Dispense: 42 tablet; Refill: 0  4. Acute pain of right shoulder Likely referred pain from neck.  - DG Cervical Spine Complete; Future     The entirety of the information documented in the History of Present Illness, Review of Systems and Physical Exam were personally obtained by me. Portions of this information were initially documented by the CMA and reviewed by me for thoroughness and accuracy.      Lelon Huh, MD  [facility] [phone] (phone) [phone] (fax)  [place]

## 2021-02-18 ENCOUNTER — Telehealth: Payer: Self-pay

## 2021-02-18 NOTE — Telephone Encounter (Signed)
Pt calling back requesting an update in regards to his x-ray.

## 2021-02-18 NOTE — Telephone Encounter (Signed)
Patient advised of x ray results. He verbalized understanding and agrees to continue prednisone. Patient wants to know if there is a maintenance medication that he can routinely take to prevent flare ups or worsening of his arthritis?

## 2021-02-18 NOTE — Telephone Encounter (Signed)
-----   Message from Birdie Sons, MD sent at 02/18/2021 12:52 PM EST ----- Xray shows severe arthritis but no new problems. Go ahead and start prednisone prescribed yesterday and let me know if not much better which finished.

## 2021-02-18 NOTE — Telephone Encounter (Signed)
Patient advised.

## 2021-02-19 NOTE — Telephone Encounter (Signed)
It's just taken during flare ups. It will usually proved relief for several months, but if the pain comes back right after finishing the prednisone we'll probably need to do an MRI.

## 2021-02-20 NOTE — Telephone Encounter (Signed)
Patient advised and agrees with Dr. Maralyn Sago recommendation. He say the pain and soreness has improved since taking prednisone, but he still has numbness in his arm as if his arm is going to sleep. Patient believes he has a pinch nerve. He is going to finish out the course of prednisone and call back if symptoms do not resolve for the MRI.

## 2021-02-23 ENCOUNTER — Telehealth: Payer: Self-pay | Admitting: Family Medicine

## 2021-02-23 DIAGNOSIS — M542 Cervicalgia: Secondary | ICD-10-CM

## 2021-02-23 NOTE — Telephone Encounter (Signed)
Medication request- does he need further studies before Rx?

## 2021-02-23 NOTE — Telephone Encounter (Signed)
Copied from [place] #[id number]. Topic: General - Other >> Feb 23, 2021  2:26 PM Leward Quan A wrote: Reason for CRM: Patient called in to inquire of Dr Caryn Section if he can prescribe him some muscle relaxers since the Xray showed that he had a pinched nerve and per patient he read somplace where a muscle relaxer will help to ease the pain. Please call patient at Ph# [phone]

## 2021-02-24 MED ORDER — CYCLOBENZAPRINE HCL 5 MG PO TABS: 5.0000 mg | ORAL_TABLET | Freq: Three times a day (TID) | ORAL | 1 refills | Status: DC | PRN

## 2021-02-24 NOTE — Telephone Encounter (Signed)
[facility]

## 2021-02-24 NOTE — Telephone Encounter (Signed)
Patient advised. He requested that I send a message to Dr. Caryn Section. Patient says, if he has to have an MRI on his back next month, he would like to have it done on his arm also to get it over with. Patient states "I don't want to have to go back twice to have the MRI done on my arm".

## 2021-02-26 ENCOUNTER — Ambulatory Visit: Payer: Self-pay | Admitting: *Deleted

## 2021-02-26 DIAGNOSIS — M542 Cervicalgia: Secondary | ICD-10-CM

## 2021-02-26 DIAGNOSIS — R200 Anesthesia of skin: Secondary | ICD-10-CM

## 2021-02-26 NOTE — Telephone Encounter (Signed)
°  Chief Complaint: Numbness right arm  Symptoms: "Shock" right arm in certain position, lasts 30 secs. "Especially when I bend over to tie my shoe."  Frequency: Saw Dr. Caryn Section 02/17/21 Pertinent Negatives: Patient denies  Disposition: [] ED /[] Urgent Care (no appt availability in office) / [] Appointment(In office/virtual)/ []  [place] [facility]/ [] Home Care/ [] Refused Recommended Disposition /[] [place] [facility]/ [x]  Follow-up with PCP Additional Notes:  Pt states completed course of prednisone yesterday "Achy feeling gone but still getting that shock." Requesting another round of prednisone. Please advise.       Reason for Disposition  [1] Weakness of arm / hand, or leg / foot AND [2] is a chronic symptom (recurrent or ongoing AND present > 4 weeks)  Answer Assessment - Initial Assessment Questions 1. SYMPTOM: "What is the main symptom you are concerned about?" (e.g., weakness, numbness)     Right arm numb when tying shoe. "Like a shock" 2. ONSET: "When did this start?" (minutes, hours, days; while sleeping)     Lasts 30 seconds 3. LAST NORMAL: "When was the last time you (the patient) were normal (no symptoms)?"     "Aching gone, numbness remains after taking prednisone."  4. PATTERN "Does this come and go, or has it been constant since it started?"  "Is it present now?"     "Only certain positions" 5. CARDIAC SYMPTOMS: "Have you had any of the following symptoms: chest pain, difficulty breathing, palpitations?"     no 6. NEUROLOGIC SYMPTOMS: "Have you had any of the following symptoms: headache, dizziness, vision loss, double vision, changes in speech, unsteady on your feet?"     No 7. OTHER SYMPTOMS: "Do you have any other symptoms?"     H/O back pain. MRI in March  Protocols used: Neurologic Deficit-A-AH

## 2021-03-02 ENCOUNTER — Other Ambulatory Visit: Payer: Self-pay | Admitting: Family Medicine

## 2021-03-02 DIAGNOSIS — G8929 Other chronic pain: Secondary | ICD-10-CM

## 2021-03-02 NOTE — Telephone Encounter (Signed)
Medication Refill - Medication: HYDROcodone-acetaminophen (NORCO) 10-325 MG tablet  Has the patient contacted their pharmacy? No. No, more refills.  (Agent: If no, request that the patient contact the pharmacy for the refill. If patient does not wish to contact the pharmacy document the reason why and proceed with request.)   Preferred Pharmacy (with phone number or street name):  [id number] [place] [place]  [place] [address]  Phone: [phone] Fax: [phone]  Hours: Not open 24 hours   Has the patient been seen for an appointment in the last year OR does the patient have an upcoming appointment? Yes.    Agent: Please be advised that RX refills may take up to 3 business days. We ask that you follow-up with your pharmacy.

## 2021-03-03 MED ORDER — HYDROCODONE-ACETAMINOPHEN 10-325 MG PO TABS: 1.0000 | ORAL_TABLET | Freq: Four times a day (QID) | ORAL | 0 refills | Status: DC | PRN

## 2021-03-03 MED ORDER — PREDNISONE 10 MG PO TABS: ORAL_TABLET | ORAL | 0 refills | Status: DC

## 2021-03-03 NOTE — Telephone Encounter (Signed)
Pt called in to follow up on refill request for   HYDROcodone-   Pt says that he previously received a 15 day supply from his pharmacy.    Please assist pt further.

## 2021-03-03 NOTE — Telephone Encounter (Signed)
Requested medication (s) are due for refill today - unsure if 15 day supply or 30 day supply  Requested medication (s) are on the active medication list - yes  Future visit scheduled -no  Last refill: 02/17/21 #60  Notes to clinic: Request RF: non delegated Rx  Requested Prescriptions  Pending Prescriptions Disp Refills   HYDROcodone-acetaminophen (NORCO) 10-325 MG tablet 60 tablet 0    Sig: Take 1 tablet by mouth every 6 (six) hours as needed.     Not Delegated - Analgesics:  Opioid Agonist Combinations Failed - 03/02/2021 11:35 AM      Failed - This refill cannot be delegated      Failed - Urine Drug Screen completed in last 360 days      Passed - Valid encounter within last 3 months    Recent Outpatient Visits           2 weeks ago Chest discomfort   [facility] Birdie Sons, MD   1 month ago Essential (primary) hypertension   [facility] Birdie Sons, MD   3 months ago Chronic right-sided low back pain with right-sided sciatica   [facility], Dionne Bucy, MD   5 months ago Acute right-sided low back pain with right-sided sciatica   [facility] Birdie Sons, MD   7 months ago Essential (primary) hypertension   [place], MD                 Requested Prescriptions  Pending Prescriptions Disp Refills   HYDROcodone-acetaminophen (NORCO) 10-325 MG tablet 60 tablet 0    Sig: Take 1 tablet by mouth every 6 (six) hours as needed.     Not Delegated - Analgesics:  Opioid Agonist Combinations Failed - 03/02/2021 11:35 AM      Failed - This refill cannot be delegated      Failed - Urine Drug Screen completed in last 360 days      Passed - Valid encounter within last 3 months    Recent Outpatient Visits           2 weeks ago Chest discomfort   [facility] Birdie Sons, MD   1 month ago Essential (primary) hypertension   [facility] Birdie Sons, MD   3 months ago Chronic right-sided low back pain with right-sided sciatica   [facility] [place], Dionne Bucy, MD   5 months ago Acute right-sided low back pain with right-sided sciatica   [facility] Birdie Sons, MD   7 months ago Essential (primary) hypertension   [place], Kirstie Peri, MD

## 2021-03-03 NOTE — Addendum Note (Signed)
Addended by: Birdie Sons on: 03/03/2021 01:01 PM   Modules accepted: Orders

## 2021-03-04 ENCOUNTER — Telehealth: Payer: Self-pay

## 2021-03-04 NOTE — Telephone Encounter (Signed)
Copied from [place] [id number]. Topic: General - Other >> Mar 04, 2021 11:50 AM Parke Poisson wrote: Reason for CRM: Pt is requesting an order for MRI of right arm to be done at same time as the cervical spine

## 2021-03-06 DIAGNOSIS — D122 Benign neoplasm of ascending colon: Secondary | ICD-10-CM | POA: Diagnosis not present

## 2021-03-06 DIAGNOSIS — K219 Gastro-esophageal reflux disease without esophagitis: Secondary | ICD-10-CM | POA: Diagnosis not present

## 2021-03-06 DIAGNOSIS — D123 Benign neoplasm of transverse colon: Secondary | ICD-10-CM | POA: Diagnosis not present

## 2021-03-06 DIAGNOSIS — Z1211 Encounter for screening for malignant neoplasm of colon: Secondary | ICD-10-CM | POA: Diagnosis not present

## 2021-03-06 DIAGNOSIS — K222 Esophageal obstruction: Secondary | ICD-10-CM | POA: Diagnosis not present

## 2021-03-06 DIAGNOSIS — K635 Polyp of colon: Secondary | ICD-10-CM | POA: Diagnosis not present

## 2021-03-06 DIAGNOSIS — K640 First degree hemorrhoids: Secondary | ICD-10-CM | POA: Diagnosis not present

## 2021-03-06 DIAGNOSIS — D125 Benign neoplasm of sigmoid colon: Secondary | ICD-10-CM | POA: Diagnosis not present

## 2021-03-06 DIAGNOSIS — K449 Diaphragmatic hernia without obstruction or gangrene: Secondary | ICD-10-CM | POA: Diagnosis not present

## 2021-03-06 DIAGNOSIS — K2289 Other specified disease of esophagus: Secondary | ICD-10-CM | POA: Diagnosis not present

## 2021-03-06 DIAGNOSIS — D120 Benign neoplasm of cecum: Secondary | ICD-10-CM | POA: Diagnosis not present

## 2021-03-06 DIAGNOSIS — Z9889 Other specified postprocedural states: Secondary | ICD-10-CM | POA: Diagnosis not present

## 2021-03-06 LAB — HM COLONOSCOPY

## 2021-03-13 ENCOUNTER — Ambulatory Visit: Payer: Medicare Other

## 2021-03-13 ENCOUNTER — Other Ambulatory Visit: Payer: Self-pay | Admitting: Family Medicine

## 2021-03-13 DIAGNOSIS — G8929 Other chronic pain: Secondary | ICD-10-CM

## 2021-03-13 NOTE — Telephone Encounter (Signed)
Medication Refill - Medication:  ? ?HYDROcodone-acetaminophen (NORCO) 10-325 MG tablet ? ?Has the patient contacted their pharmacy? No. Pt stated he contacts the office for refills.  ?(Agent: If no, request that the patient contact the pharmacy for the refill. If patient does not wish to contact the pharmacy document the reason why and proceed with request.) ?(Agent: If yes, when and what did the pharmacy advise?) ? ?Preferred Pharmacy (with phone number or street name):  ? ?[id number] [place], [place]  ?[place], [place] [address]  ?Phone:  [phone]  Fax:  [phone]  ? ? ?Has the patient been seen for an appointment in the last year OR does the patient have an upcoming appointment? Yes.   ? ?Agent: Please be advised that RX refills may take up to 3 business days. We ask that you follow-up with your pharmacy. ? ?

## 2021-03-13 NOTE — Telephone Encounter (Signed)
Requested medication (s) are due for refill today no ? ?Requested medication (s) are on the active medication list -yes ? ?Future visit scheduled -no ? ?Last refill: 03/03/21 #60 ? ?Notes to clinic: Request RF: non delegated Rx ? ?Requested Prescriptions  ?Pending Prescriptions Disp Refills  ? HYDROcodone-acetaminophen (NORCO) 10-325 MG tablet 60 tablet 0  ?  Sig: Take 1 tablet by mouth every 6 (six) hours as needed.  ?  ? Not Delegated - Analgesics:  Opioid Agonist Combinations Failed - 03/13/2021  4:18 PM  ?  ?  Failed - This refill cannot be delegated  ?  ?  Failed - Urine Drug Screen completed in last 360 days  ?  ?  Passed - Valid encounter within last 3 months  ?  Recent Outpatient Visits   ? ?      ? 3 weeks ago Chest discomfort  ? [facility] Caryn Section, Kirstie Peri, MD  ? 1 month ago Essential (primary) hypertension  ? [facility] Birdie Sons, MD  ? 3 months ago Chronic right-sided low back pain with right-sided sciatica  ? [facility] [place], Dionne Bucy, MD  ? 5 months ago Acute right-sided low back pain with right-sided sciatica  ? [facility] Birdie Sons, MD  ? 8 months ago Essential (primary) hypertension  ? [facility] Caryn Section, Kirstie Peri, MD  ? ?  ?  ? ?  ?  ?  ? ? ? ?Requested Prescriptions  ?Pending Prescriptions Disp Refills  ? HYDROcodone-acetaminophen (NORCO) 10-325 MG tablet 60 tablet 0  ?  Sig: Take 1 tablet by mouth every 6 (six) hours as needed.  ?  ? Not Delegated - Analgesics:  Opioid Agonist Combinations Failed - 03/13/2021  4:18 PM  ?  ?  Failed - This refill cannot be delegated  ?  ?  Failed - Urine Drug Screen completed in last 360 days  ?  ?  Passed - Valid encounter within last 3 months  ?  Recent Outpatient Visits   ? ?      ? 3 weeks ago Chest discomfort  ? [facility] Caryn Section, Kirstie Peri, MD  ? 1 month ago Essential (primary) hypertension  ? [facility] Birdie Sons, MD  ? 3  months ago Chronic right-sided low back pain with right-sided sciatica  ? [facility], Dionne Bucy, MD  ? 5 months ago Acute right-sided low back pain with right-sided sciatica  ? [facility] Birdie Sons, MD  ? 8 months ago Essential (primary) hypertension  ? [facility] Caryn Section, Kirstie Peri, MD  ? ?  ?  ? ?  ?  ?  ? ? ? ?

## 2021-03-15 MED ORDER — HYDROCODONE-ACETAMINOPHEN 10-325 MG PO TABS: 1.0000 | ORAL_TABLET | Freq: Four times a day (QID) | ORAL | 0 refills | Status: DC | PRN

## 2021-03-21 ENCOUNTER — Ambulatory Visit
Admission: RE | Admit: 2021-03-21 | Discharge: 2021-03-21 | Disposition: A | Discharge status: Home / Self Care | Payer: Medicare Other | Source: Ambulatory Visit | Attending: Family Medicine | Admitting: Family Medicine

## 2021-03-21 ENCOUNTER — Ambulatory Visit
Admission: RE | Admit: 2021-03-21 | Discharge: 2021-03-21 | Disposition: A | Discharge status: Home / Self Care | Payer: Medicare Other | Source: Ambulatory Visit | Attending: Neurosurgery | Admitting: Neurosurgery

## 2021-03-21 ENCOUNTER — Other Ambulatory Visit: Payer: Self-pay

## 2021-03-21 DIAGNOSIS — R200 Anesthesia of skin: Secondary | ICD-10-CM | POA: Diagnosis not present

## 2021-03-21 DIAGNOSIS — M542 Cervicalgia: Secondary | ICD-10-CM | POA: Insufficient documentation

## 2021-03-21 DIAGNOSIS — M5416 Radiculopathy, lumbar region: Secondary | ICD-10-CM | POA: Diagnosis not present

## 2021-03-21 DIAGNOSIS — Z9889 Other specified postprocedural states: Secondary | ICD-10-CM | POA: Diagnosis not present

## 2021-03-21 DIAGNOSIS — M5136 Other intervertebral disc degeneration, lumbar region: Secondary | ICD-10-CM

## 2021-03-21 DIAGNOSIS — M4802 Spinal stenosis, cervical region: Secondary | ICD-10-CM | POA: Diagnosis not present

## 2021-03-21 DIAGNOSIS — M5412 Radiculopathy, cervical region: Secondary | ICD-10-CM | POA: Diagnosis not present

## 2021-03-21 DIAGNOSIS — M2578 Osteophyte, vertebrae: Secondary | ICD-10-CM | POA: Diagnosis not present

## 2021-03-21 DIAGNOSIS — M48061 Spinal stenosis, lumbar region without neurogenic claudication: Secondary | ICD-10-CM | POA: Diagnosis not present

## 2021-03-21 DIAGNOSIS — M5127 Other intervertebral disc displacement, lumbosacral region: Secondary | ICD-10-CM | POA: Diagnosis not present

## 2021-03-25 ENCOUNTER — Telehealth: Payer: Self-pay | Admitting: Family Medicine

## 2021-03-25 NOTE — Telephone Encounter (Signed)
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 He needs to see neurosurgeon about this. Does he already have an appointment scheduled?  ?

## 2021-03-25 NOTE — Telephone Encounter (Signed)
Pt called in to get mri results. Please call back when available ?

## 2021-03-25 NOTE — Telephone Encounter (Signed)
Please review and advise on report for MRI of C spine and lumbar spine. ?

## 2021-03-26 NOTE — Telephone Encounter (Signed)
Referral order placed. Please schedule appt.  

## 2021-03-26 NOTE — Telephone Encounter (Signed)
Message from Dr. Caryn Section read, verbalizes understanding. Pt does not have appt with neurosurgeon. Pt would like to see one in [place] area if referral needed. ?

## 2021-03-26 NOTE — Telephone Encounter (Signed)
Tried calling patient. Left message to call back. OK for [facility] triage advise.  ?

## 2021-03-27 ENCOUNTER — Other Ambulatory Visit: Payer: Self-pay | Admitting: Family Medicine

## 2021-03-27 DIAGNOSIS — G8929 Other chronic pain: Secondary | ICD-10-CM

## 2021-03-27 MED ORDER — HYDROCODONE-ACETAMINOPHEN 10-325 MG PO TABS: 1.0000 | ORAL_TABLET | Freq: Four times a day (QID) | ORAL | 0 refills | Status: DC | PRN

## 2021-03-27 NOTE — Telephone Encounter (Signed)
Medication Refill - Medication: HYDROcodone-acetaminophen (NORCO) 10-325 MG tablet  ? ?Has the patient contacted their pharmacy? No. Calls this one into office  ?(Agent: If no, request that the patient contact the pharmacy for the refill. If patient does not wish to contact the pharmacy document the reason why and proceed with request.) ?(Agent: If yes, when and what did the pharmacy advise?) ? ?Preferred Pharmacy (with phone number or street name): [id number] [place] [place] [address] [id number] ?Phone:  [phone] Fax:  [phone]?Has the patient been seen for an appointment in the last year OR does the patient have an upcoming appointment? Yes.   ? ?Agent: Please be advised that RX refills may take up to 3 business days. We ask that you follow-up with your pharmacy. ?

## 2021-03-27 NOTE — Telephone Encounter (Signed)
Requested medication (s) are due for refill today - unsure ? ?Requested medication (s) are on the active medication list -yes ? ?Future visit scheduled -yes ? ?Last refill: 03/15/21 #60 ? ?Notes to clinic: Request RF: non delegated Rx ? ?Requested Prescriptions  ?Pending Prescriptions Disp Refills  ? HYDROcodone-acetaminophen (NORCO) 10-325 MG tablet 60 tablet 0  ?  Sig: Take 1 tablet by mouth every 6 (six) hours as needed.  ?  ? Not Delegated - Analgesics:  Opioid Agonist Combinations Failed - 03/27/2021  3:48 PM  ?  ?  Failed - This refill cannot be delegated  ?  ?  Failed - Urine Drug Screen completed in last 360 days  ?  ?  Passed - Valid encounter within last 3 months  ?  Recent Outpatient Visits   ? ?      ? 1 month ago Chest discomfort  ? [facility] Birdie Sons, MD  ? 2 months ago Essential (primary) hypertension  ? [facility] Birdie Sons, MD  ? 3 months ago Chronic right-sided low back pain with right-sided sciatica  ? [facility] [place], Dionne Bucy, MD  ? 6 months ago Acute right-sided low back pain with right-sided sciatica  ? [facility] Birdie Sons, MD  ? 8 months ago Essential (primary) hypertension  ? [facility] Caryn Section, Kirstie Peri, MD  ? ?  ?  ? ?  ?  ?  ? ? ? ?Requested Prescriptions  ?Pending Prescriptions Disp Refills  ? HYDROcodone-acetaminophen (NORCO) 10-325 MG tablet 60 tablet 0  ?  Sig: Take 1 tablet by mouth every 6 (six) hours as needed.  ?  ? Not Delegated - Analgesics:  Opioid Agonist Combinations Failed - 03/27/2021  3:48 PM  ?  ?  Failed - This refill cannot be delegated  ?  ?  Failed - Urine Drug Screen completed in last 360 days  ?  ?  Passed - Valid encounter within last 3 months  ?  Recent Outpatient Visits   ? ?      ? 1 month ago Chest discomfort  ? [facility] Birdie Sons, MD  ? 2 months ago Essential (primary) hypertension  ? [facility] Birdie Sons, MD   ? 3 months ago Chronic right-sided low back pain with right-sided sciatica  ? [facility] [place], Dionne Bucy, MD  ? 6 months ago Acute right-sided low back pain with right-sided sciatica  ? [facility] Birdie Sons, MD  ? 8 months ago Essential (primary) hypertension  ? [facility] Caryn Section, Kirstie Peri, MD  ? ?  ?  ? ?  ?  ?  ? ? ? ?

## 2021-03-31 DIAGNOSIS — M5136 Other intervertebral disc degeneration, lumbar region: Secondary | ICD-10-CM | POA: Diagnosis not present

## 2021-04-10 ENCOUNTER — Other Ambulatory Visit: Payer: Self-pay | Admitting: Family Medicine

## 2021-04-10 DIAGNOSIS — G8929 Other chronic pain: Secondary | ICD-10-CM

## 2021-04-10 NOTE — Telephone Encounter (Signed)
Requested medication (s) are due for refill today: Yes ? ?Requested medication (s) are on the active medication list: Yes ? ?Last refill:  03/27/21 ? ?Future visit scheduled: Yes ? ?Notes to clinic:  Unable to refill per protocol, cannot delegate.  ? ? ? ? ?Requested Prescriptions  ?Pending Prescriptions Disp Refills  ? HYDROcodone-acetaminophen (NORCO) 10-325 MG tablet 60 tablet 0  ?  Sig: Take 1 tablet by mouth every 6 (six) hours as needed.  ?  ? Not Delegated - Analgesics:  Opioid Agonist Combinations Failed - 04/10/2021  5:00 PM  ?  ?  Failed - This refill cannot be delegated  ?  ?  Failed - Urine Drug Screen completed in last 360 days  ?  ?  Passed - Valid encounter within last 3 months  ?  Recent Outpatient Visits   ? ?      ? 1 month ago Chest discomfort  ? [facility] Birdie Sons, MD  ? 2 months ago Essential (primary) hypertension  ? [facility] Birdie Sons, MD  ? 4 months ago Chronic right-sided low back pain with right-sided sciatica  ? [facility] [place], Dionne Bucy, MD  ? 6 months ago Acute right-sided low back pain with right-sided sciatica  ? [facility] Birdie Sons, MD  ? 8 months ago Essential (primary) hypertension  ? [facility] Caryn Section, Kirstie Peri, MD  ? ?  ?  ? ?  ?  ?  ? ? ? ? ?

## 2021-04-10 NOTE — Telephone Encounter (Signed)
Medication Refill - Medication: HYDROcodone-acetaminophen (NORCO) 10-325 MG tablet  ? ?Has the patient contacted their pharmacy? No. ?(Agent: If no, request that the patient contact the pharmacy for the refill. If patient does not wish to contact the pharmacy document the reason why and proceed with request.) ?(Agent: If yes, when and what did the pharmacy advise?) ? ?Preferred Pharmacy (with phone number or street name): [id number] [place] ?[address] [place] [place] ?Phone:  [phone] Fax:  [phone] ?Has the patient been seen for an appointment in the last year OR does the patient have an upcoming appointment? Yes.   ? ?Agent: Please be advised that RX refills may take up to 3 business days. We ask that you follow-up with your pharmacy. ?

## 2021-04-12 MED ORDER — HYDROCODONE-ACETAMINOPHEN 10-325 MG PO TABS: 1.0000 | ORAL_TABLET | Freq: Four times a day (QID) | ORAL | 0 refills | Status: DC | PRN

## 2021-04-24 ENCOUNTER — Other Ambulatory Visit: Payer: Self-pay | Admitting: Family Medicine

## 2021-04-24 DIAGNOSIS — G8929 Other chronic pain: Secondary | ICD-10-CM

## 2021-04-24 NOTE — Telephone Encounter (Signed)
Requested medication (s) are due for refill today:   Provider to review ? ?Requested medication (s) are on the active medication list:   Yes ? ?Future visit scheduled:   No ? ? ?Last ordered: 04/12/2021 #60, 0 refill ? ?Non delegated refill  ? ?Requested Prescriptions  ?Pending Prescriptions Disp Refills  ? HYDROcodone-acetaminophen (NORCO) 10-325 MG tablet 60 tablet 0  ?  Sig: Take 1 tablet by mouth every 6 (six) hours as needed.  ?  ? Not Delegated - Analgesics:  Opioid Agonist Combinations Failed - 04/24/2021  9:37 AM  ?  ?  Failed - This refill cannot be delegated  ?  ?  Failed - Urine Drug Screen completed in last 360 days  ?  ?  Passed - Valid encounter within last 3 months  ?  Recent Outpatient Visits   ? ?      ? 2 months ago Chest discomfort  ? [facility] Birdie Sons, MD  ? 3 months ago Essential (primary) hypertension  ? [facility] Birdie Sons, MD  ? 4 months ago Chronic right-sided low back pain with right-sided sciatica  ? [facility] [place], Dionne Bucy, MD  ? 7 months ago Acute right-sided low back pain with right-sided sciatica  ? [facility] Birdie Sons, MD  ? 9 months ago Essential (primary) hypertension  ? [facility] Caryn Section, Kirstie Peri, MD  ? ?  ?  ? ? ?  ?  ?  ? ?

## 2021-04-24 NOTE — Telephone Encounter (Signed)
Medication Refill - Medication: HYDROcodone-acetaminophen (NORCO) 10-325 MG tablet ? ?Has the patient contacted their pharmacy? No. No refills. ? ?(Agent: If no, request that the patient contact the pharmacy for the refill. If patient does not wish to contact the pharmacy document the reason why and proceed with request.) ? ? ?Preferred Pharmacy (with phone number or street name):  ?[id number] [place] ?[address] [place] [address] ?Phone: [phone]: [phone] ?Hours: Not open 24 hours  ? ?Has the patient been seen for an appointment in the last year OR does the patient have an upcoming appointment? Yes.   ? ?Agent: Please be advised that RX refills may take up to 3 business days. We ask that you follow-up with your pharmacy.  ?

## 2021-04-26 MED ORDER — HYDROCODONE-ACETAMINOPHEN 10-325 MG PO TABS: 1.0000 | ORAL_TABLET | Freq: Four times a day (QID) | ORAL | 0 refills | Status: DC | PRN

## 2021-05-08 ENCOUNTER — Other Ambulatory Visit: Payer: Self-pay | Admitting: Family Medicine

## 2021-05-08 DIAGNOSIS — G8929 Other chronic pain: Secondary | ICD-10-CM

## 2021-05-08 MED ORDER — HYDROCODONE-ACETAMINOPHEN 10-325 MG PO TABS: 1.0000 | ORAL_TABLET | Freq: Four times a day (QID) | ORAL | 0 refills | Status: DC | PRN

## 2021-05-08 NOTE — Telephone Encounter (Signed)
Medication Refill - Medication: HYDROcodone-acetaminophen (NORCO) 10-325 MG tablet ? ?Has the patient contacted their pharmacy? Yes.   ? ?(Agent: If yes, when and what did the pharmacy advise?) Contact PCP office  ? ? ?Preferred Pharmacy (with phone number or street name):  ?[id number] [place] ?[address] [id number] ?Phone:  [phone] Fax:  [phone] ? ?Has the patient been seen for an appointment in the last year OR does the patient have an upcoming appointment? Yes.   ? ?Agent: Please be advised that RX refills may take up to 3 business days. We ask that you follow-up with your pharmacy. ?

## 2021-05-08 NOTE — Telephone Encounter (Signed)
Requested medication (s) are due for refill today - unsure ? ?Requested medication (s) are on the active medication list -yes ? ?Future visit scheduled -no ? ?Last refill: 04/26/21#60 ? ?Notes to clinic: Request RF: non delegated Rx ? ?Requested Prescriptions  ?Pending Prescriptions Disp Refills  ? HYDROcodone-acetaminophen (NORCO) 10-325 MG tablet 60 tablet 0  ?  Sig: Take 1 tablet by mouth every 6 (six) hours as needed.  ?  ? Not Delegated - Analgesics:  Opioid Agonist Combinations Failed - 05/08/2021  2:57 PM  ?  ?  Failed - This refill cannot be delegated  ?  ?  Failed - Urine Drug Screen completed in last 360 days  ?  ?  Passed - Valid encounter within last 3 months  ?  Recent Outpatient Visits   ? ?      ? 2 months ago Chest discomfort  ? [facility] Birdie Sons, MD  ? 3 months ago Essential (primary) hypertension  ? [facility] Birdie Sons, MD  ? 5 months ago Chronic right-sided low back pain with right-sided sciatica  ? [facility] [place], Dionne Bucy, MD  ? 7 months ago Acute right-sided low back pain with right-sided sciatica  ? [facility] Birdie Sons, MD  ? 9 months ago Essential (primary) hypertension  ? [facility] Caryn Section, Kirstie Peri, MD  ? ?  ?  ? ? ?  ?  ?  ? ? ? ?Requested Prescriptions  ?Pending Prescriptions Disp Refills  ? HYDROcodone-acetaminophen (NORCO) 10-325 MG tablet 60 tablet 0  ?  Sig: Take 1 tablet by mouth every 6 (six) hours as needed.  ?  ? Not Delegated - Analgesics:  Opioid Agonist Combinations Failed - 05/08/2021  2:57 PM  ?  ?  Failed - This refill cannot be delegated  ?  ?  Failed - Urine Drug Screen completed in last 360 days  ?  ?  Passed - Valid encounter within last 3 months  ?  Recent Outpatient Visits   ? ?      ? 2 months ago Chest discomfort  ? [facility] Birdie Sons, MD  ? 3 months ago Essential (primary) hypertension  ? [facility] Birdie Sons,  MD  ? 5 months ago Chronic right-sided low back pain with right-sided sciatica  ? [facility] [place], Dionne Bucy, MD  ? 7 months ago Acute right-sided low back pain with right-sided sciatica  ? [facility] Birdie Sons, MD  ? 9 months ago Essential (primary) hypertension  ? [facility] Caryn Section, Kirstie Peri, MD  ? ?  ?  ? ? ?  ?  ?  ? ? ? ?

## 2021-05-18 ENCOUNTER — Ambulatory Visit: Payer: Self-pay | Admitting: *Deleted

## 2021-05-18 DIAGNOSIS — R200 Anesthesia of skin: Secondary | ICD-10-CM

## 2021-05-18 DIAGNOSIS — M542 Cervicalgia: Secondary | ICD-10-CM

## 2021-05-18 MED ORDER — PREDNISONE 10 MG PO TABS: ORAL_TABLET | ORAL | 0 refills | Status: AC

## 2021-05-18 NOTE — Addendum Note (Signed)
Addended by: Birdie Sons on: 05/18/2021 09:56 AM ? ? Modules accepted: Orders ? ?

## 2021-05-18 NOTE — Telephone Encounter (Signed)
?  Chief Complaint: Since Dr. Caryn Section is aware of the pinched nerve in your neck and you are having severe pain, you are requesting he call in some prednisone because that has helped in the past.   ?Symptoms: Severe neck pain, right arm numbness and sharp pain between his shoulder blades.   These are his usual signs when the pinched nerve in his neck flares up. ?Frequency: Now  Constantly ?Pertinent Negatives: Patient denies shortness of breath, chest pain, or breaking out in sweats.   These are my usual symptoms when my neck flares up.   ?Disposition: '[]'$ ED /'[]'$ Urgent Care (no appt availability in office) / '[]'$ Appointment(In office/virtual)/ '[]'$  [place] [facility]/ '[]'$ Home Care/ '[]'$ Refused Recommended Disposition /'[]'$ [place] [facility]/ '[x]'$  Follow-up with PCP ?Additional Notes: High priority message sent to [facility] for Dr. Caryn Section to see if he would be willing to call in a prednisone rx since he is aware of this problem.     ?

## 2021-05-18 NOTE — Telephone Encounter (Signed)
Reason for Disposition ? Numbness in an arm or hand (i.e., loss of sensation) ? ?Answer Assessment - Initial Assessment Questions ?1. ONSET: "When did the pain begin?"  ?    I have a pinched nerve in my neck.   Dr. Caryn Section is aware of it.   The pain went away but now it's back.   My arm is numb and my neck is hurting.   Pain between my shoulder blades too.   This is my usual symptoms.   I'm needing surgery on my neck.   The prednisone helps it,  Would Dr. Caryn Section be willing to call in prednisone for me?  It has helped in the past.   I'm going to call the surgeon and get surgery lined up.   This pain is getting to be too much.  ?2. LOCATION: "Where does it hurt?"  ?    Neck, arm numb on right side and sharp pain between shoulder blades.   This is the same pains I have when my neck flares up from the pinched nerve.   ?3. PATTERN "Does the pain come and go, or has it been constant since it started?"  ?    It's constant now but it had gone away so I didn't end up getting a shot in my neck that Dr. Caryn Section and I talked about.   ?4. SEVERITY: "How bad is the pain?"  (Scale 1-10; or mild, moderate, severe) ?  - NO PAIN (0): no pain or only slight stiffness  ?  - MILD (1-3): doesn't interfere with normal activities  ?  - MODERATE (4-7): interferes with normal activities or awakens from sleep  ?  - SEVERE (8-10):  excruciating pain, unable to do any normal activities  ?    Severe ?5. RADIATION: "Does the pain go anywhere else, shoot into your arms?" ?    Right arm and between my shoulder blades ?6. CORD SYMPTOMS: "Any weakness or numbness of the arms or legs?" ?    Right arm numb ?7. CAUSE: "What do you think is causing the neck pain?" ?    I have a pinched nerve.   I'm needing to have surgery.   I think it's time for it. ?8. NECK OVERUSE: "Any recent activities that involved turning or twisting the neck?" ?    No ?9. OTHER SYMPTOMS: "Do you have any other symptoms?" (e.g., headache, fever, chest pain, difficulty breathing,  neck swelling) ?    Denies chest pain, breaking out in sweats, or shortness of breath. ?10. PREGNANCY: "Is there any chance you are pregnant?" "When was your last menstrual period?" ?      N/A ? ?Protocols used: Neck Pain or Stiffness-A-AH ? ?

## 2021-05-22 ENCOUNTER — Other Ambulatory Visit: Payer: Self-pay | Admitting: Family Medicine

## 2021-05-22 DIAGNOSIS — M5412 Radiculopathy, cervical region: Secondary | ICD-10-CM | POA: Diagnosis not present

## 2021-05-22 DIAGNOSIS — M5441 Lumbago with sciatica, right side: Secondary | ICD-10-CM

## 2021-05-22 DIAGNOSIS — M5020 Other cervical disc displacement, unspecified cervical region: Secondary | ICD-10-CM | POA: Diagnosis not present

## 2021-05-22 NOTE — Telephone Encounter (Signed)
Copied from [place] [id number]. Topic: General - Other ?>> May 22, 2021  8:55 AM [name] A wrote: ?Reason for CRM: Medication Refill - Medication: HYDROcodone-acetaminophen (NORCO) 10-325 MG tablet [[id number]] ? ?Has the patient contacted their pharmacy? No. ?(Agent: If no, request that the patient contact the pharmacy for the refill. If patient does not wish to contact the pharmacy document the reason why and proceed with request.) ?(Agent: If yes, when and what did the pharmacy advise?) ? ?Preferred Pharmacy (with phone number or street name): [id number] [place] [place] [address]?Phone: [phone]: [phone]?Hours: Not open 24 hours ? ? ?Has the patient been seen for an appointment in the last year OR does the patient have an upcoming appointment? Yes.   ? ?Agent: Please be advised that RX refills may take up to 3 business days. We ask that you follow-up with your pharmacy. ?

## 2021-05-25 ENCOUNTER — Ambulatory Visit: Payer: Self-pay | Admitting: *Deleted

## 2021-05-25 ENCOUNTER — Other Ambulatory Visit: Payer: Self-pay | Admitting: Family Medicine

## 2021-05-25 DIAGNOSIS — R200 Anesthesia of skin: Secondary | ICD-10-CM

## 2021-05-25 DIAGNOSIS — G8929 Other chronic pain: Secondary | ICD-10-CM

## 2021-05-25 DIAGNOSIS — M542 Cervicalgia: Secondary | ICD-10-CM

## 2021-05-25 MED ORDER — HYDROCODONE-ACETAMINOPHEN 10-325 MG PO TABS: 1.0000 | ORAL_TABLET | Freq: Four times a day (QID) | ORAL | 0 refills | Status: DC | PRN

## 2021-05-25 NOTE — Telephone Encounter (Signed)
Pt was upset that his medication has not been called in yet, pt would like a call back to know when it will be sent in.  ?

## 2021-05-25 NOTE — Telephone Encounter (Signed)
Requested medication (s) are due for refill today:   Provider to review ? ?Requested medication (s) are on the active medication list:   Yes ? ?Future visit scheduled:   No ? ? ?Last ordered: 05/08/2021 #60, 0 refills ? ?Returned because it's a non delegated refill  ? ?Requested Prescriptions  ?Pending Prescriptions Disp Refills  ? HYDROcodone-acetaminophen (NORCO) 10-325 MG tablet 60 tablet 0  ?  Sig: Take 1 tablet by mouth every 6 (six) hours as needed.  ?  ? Not Delegated - Analgesics:  Opioid Agonist Combinations Failed - 05/22/2021 10:02 AM  ?  ?  Failed - This refill cannot be delegated  ?  ?  Failed - Urine Drug Screen completed in last 360 days  ?  ?  Failed - Valid encounter within last 3 months  ?  Recent Outpatient Visits   ? ?      ? 3 months ago Chest discomfort  ? [facility] Birdie Sons, MD  ? 4 months ago Essential (primary) hypertension  ? [facility] Birdie Sons, MD  ? 5 months ago Chronic right-sided low back pain with right-sided sciatica  ? [facility] [place], Dionne Bucy, MD  ? 8 months ago Acute right-sided low back pain with right-sided sciatica  ? [facility] Birdie Sons, MD  ? 10 months ago Essential (primary) hypertension  ? [facility] Caryn Section, Kirstie Peri, MD  ? ?  ?  ? ? ?  ?  ?  ? ?

## 2021-05-25 NOTE — Telephone Encounter (Addendum)
Hydrocodone ?Last refill: 05/08/2021 #60 with 0 refills ? ?Prednisone ?05/18/2021 #42 with 0 refills ? ?Last office visit: 02/17/2021 ?Next office visit: No future visit with PCP scheduled ?

## 2021-05-25 NOTE — Telephone Encounter (Signed)
Patient called stating he is in a lot of pain and is requesting a refill on HYDROcodone-acetaminophen (NORCO) 10-325 MG tablet and Prednisone.  ?He would like a call back from the nurse please. ? ?[id number] [place]  [phone] ?Fax:  [phone] ?  ? ?

## 2021-05-25 NOTE — Progress Notes (Deleted)
     I,Jana Dymond Spreen,acting as a Education administrator for [employer], PA-C.,have documented all relevant documentation on the behalf of Mardene Speak, PA-C,as directed by  [employer], PA-C while in the presence of [employer], PA-C.   Established patient visit   Patient: Gary Carter   DOB: [birth year]   71 y.o. Male  MRN: [id number] Visit Date: 05/26/2021  Today's healthcare provider: Mardene Speak, PA-C   No chief complaint on file. CC: Right arm pain  Subjective     Gary Carter is a 71 year old male who presents today for right arm pain and numbness.  Reports this has been going in for years due to pinched nerve in neck.  Hand goes numb.  Reports pain is severe even with pain medication. Pain is constant and getting worse.    Medications: Outpatient Medications Prior to Visit  Medication Sig   amLODipine (NORVASC) 10 MG tablet TAKE 1 TABLET BY MOUTH EVERY DAY   aspirin 325 MG tablet Take 325 mg by mouth daily.    Cholecalciferol (VITAMIN D3 PO) Take daily by mouth. (Patient not taking: Reported on 02/12/2021)   citalopram (CELEXA) 40 MG tablet TAKE 1 TABLET BY MOUTH EVERY DAY   clonazePAM (KLONOPIN) 0.5 MG tablet Take by mouth.   Cyanocobalamin (VITAMIN B-12 PO) Take by mouth daily. (Patient not taking: Reported on 02/12/2021)   cyclobenzaprine (FLEXERIL) 5 MG tablet Take 1 tablet (5 mg total) by mouth 3 (three) times daily as needed for muscle spasms.   fluticasone (FLONASE) 50 MCG/ACT nasal spray SPRAY 2 SPRAYS INTO EACH NOSTRIL EVERY DAY   HYDROcodone-acetaminophen (NORCO) 10-325 MG tablet Take 1 tablet by mouth every 6 (six) hours as needed.   meloxicam (MOBIC) 7.5 MG tablet Take by mouth.   metoprolol succinate (TOPROL-XL) 50 MG 24 hr tablet Take 1 tablet (50 mg total) by mouth every evening. Take with or immediately following a meal.   omeprazole (PRILOSEC) 40 MG capsule TAKE 1 CAPSULE (40 MG TOTAL) BY MOUTH DAILY.   pravastatin (PRAVACHOL) 40 MG tablet TAKE 1 TABLET BY MOUTH  EVERY DAY   predniSONE (DELTASONE) 10 MG tablet 6 tablets for 2 days, then 5 for 2 days, then 4 for 2 days, then 3 for 2 days, then 2 for 2 days, then 1 for 2 days.   sildenafil (REVATIO) 20 MG tablet TAKE 1-2 TABLET BY MOUTH EVERY DAILY   spironolactone (ALDACTONE) 25 MG tablet TAKE 1 TABLET BY MOUTH EVERY DAY   valsartan-hydrochlorothiazide (DIOVAN-HCT) 160-25 MG tablet Take 1 tablet by mouth daily. Take in place of atacand-hctz   Vitamin E 268 MG (400 UNIT) CAPS Take by mouth.   No facility-administered medications prior to visit.    Review of Systems  {Labs  Heme  Chem  Endocrine  Serology  Results Review (optional):23779}   Objective    There were no vitals taken for this visit. {Show previous vital signs (optional):23777}  Physical Exam  ***  No results found for any visits on 05/26/21.  Assessment & Plan     ***  No follow-ups on file.      {provider attestation***:1}   Mardene Speak, Hershal Coria  [facility] [phone] (phone) [phone] (fax)  [place]

## 2021-05-25 NOTE — Telephone Encounter (Signed)
?  Chief Complaint: Severe pain in right arm that has been going on a very long time due to a pinched nerve in neck per pt.  Hand goes numb too.   C/o pain being very severe even with the pain medication. ?Symptoms: above ?Frequency: Constantly and getting worse ?Pertinent Negatives: Patient denies N/A ?Disposition: '[]'$ ED /'[]'$ Urgent Care (no appt availability in office) / '[x]'$ Appointment(In office/virtual)/ '[]'$  [place] Virtual [facility]/ '[]'$ Home Care/ '[]'$ Refused Recommended Disposition /'[]'$ [place] Mobile Bus/ '[]'$  Follow-up with PCP ?Additional Notes: Appt made with Mardene Speak, PA for 05/26/2021.     ?

## 2021-05-25 NOTE — Telephone Encounter (Signed)
Reason for Disposition ? [1] MODERATE pain (e.g., interferes with normal activities) AND [2] present > 3 days ? ?Answer Assessment - Initial Assessment Questions ?1. ONSET: "When did the pain start?" ?    Having pain in right arm real bad.   I have a pinched nerve.   I had an injection last Friday in my arm.   I can't hardly move my right arm. ?2. LOCATION: "Where is the pain located?" ?    Right arm.   I don't know if anything else would help this pain.   It's really bad pain. ?3. PAIN: "How bad is the pain?" (Scale 1-10; or mild, moderate, severe) ?  - MILD (1-3): doesn't interfere with normal activities ?  - MODERATE (4-7): interferes with normal activities (e.g., work or school) or awakens from sleep ?  - SEVERE (8-10): excruciating pain, unable to do any normal activities, unable to hold a cup of water ?    Severe pain. ?4. WORK OR EXERCISE: "Has there been any recent work or exercise that involved this part of the body?" ?    *No Answer* ?5. CAUSE: "What do you think is causing the arm pain?" ?    Pinched nerve. ?6. OTHER SYMPTOMS: "Do you have any other symptoms?" (e.g., neck pain, swelling, rash, fever, numbness, weakness) ?    *No Answer* ?7. PREGNANCY: "Is there any chance you are pregnant?" "When was your last menstrual period?" ?    *No Answer* ? ?Protocols used: Arm Pain-A-AH ? ?

## 2021-05-26 ENCOUNTER — Telehealth: Payer: Self-pay | Admitting: Family Medicine

## 2021-05-26 ENCOUNTER — Ambulatory Visit: Payer: Medicare Other | Admitting: Physician Assistant

## 2021-05-26 NOTE — Telephone Encounter (Signed)
Patient had an appointment to see Gary Carter today for evaluation of arm pain. Per triage note from yesterday: ? ? "Severe pain in right arm that has been going on a very long time due to a pinched nerve in neck per pt.  Hand goes numb too.   C/o pain being very severe even with the pain medication." ? ?Patient called the office today and cancelled appointment due to lack of energy. He is requesting a prescription for prednisone.  Please advise ?

## 2021-05-26 NOTE — Telephone Encounter (Signed)
Pt called reporting that he does not want to keep his appt because he does not have the energy to come in. He is requesting a prescription for Prednisone. He just received an injection from the pain clinic and says the last time he had this it took 2 rounds of prednisone for him to feel better.  ? ?[id number] [place] ?[address] [place] [address] ?Phone: [phone]: [phone] ? ?

## 2021-05-27 NOTE — Telephone Encounter (Signed)
He was just prescribed 12 days of prednisone 5-18 and should still be on it. 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 He needs referral to pain clinic if he's not doing better than he was before starting this round of prednisone.  ?

## 2021-05-27 NOTE — Telephone Encounter (Signed)
Pt informed and states the pain is actually much better today.  Slept in recliner last night and did not do any tossing and turning. He would like to forgo the referral for pain clinic at this time.   ?

## 2021-06-05 ENCOUNTER — Ambulatory Visit: Payer: Self-pay

## 2021-06-05 ENCOUNTER — Other Ambulatory Visit: Payer: Self-pay | Admitting: Family Medicine

## 2021-06-05 DIAGNOSIS — G8929 Other chronic pain: Secondary | ICD-10-CM

## 2021-06-05 MED ORDER — HYDROCODONE-ACETAMINOPHEN 10-325 MG PO TABS: 1.0000 | ORAL_TABLET | Freq: Four times a day (QID) | ORAL | 0 refills | Status: DC | PRN

## 2021-06-05 NOTE — Telephone Encounter (Signed)
  Chief Complaint: neck pain Symptoms: neck and arm pain on R side, numbness in arm as well Frequency: ongoing issue Pertinent Negatives:NA Disposition: '[]'$ ED /'[]'$ Urgent Care (no appt availability in office) / '[]'$ Appointment(In office/virtual)/ '[]'$  [place] [facility]/ '[]'$ Home Care/ '[]'$ Refused Recommended Disposition /'[]'$ [place] [facility]/ '[x]'$  Follow-up with PCP Additional Notes: pt called in seeing if he can get refill on Norco since he is almost out and will need it thru the weekend. Pt isnt able to get another cortisone shot until 3 months from last one. He states the specialist is suppose to call him back today to schedule PT for him but he has been having to take his pain medication more often to help. Pt didn't want to come in for OV. Advised pt I would sent message to Dr. Caryn Section for f/up.   Reason for Disposition  [1] Caller has URGENT medicine question about med that PCP or specialist prescribed AND [2] triager unable to answer question  Answer Assessment - Initial Assessment Questions 1. ONSET: "When did the pain start?"     Having pain in right arm real bad.   I have a pinched nerve.   I had an injection last Friday in my arm.   I can't hardly move my right arm. 2. LOCATION: "Where is the pain located?"     Right arm.   I don't know if anything else would help this pain.   It's really bad pain. 3. PAIN: "How bad is the pain?" (Scale 1-10; or mild, moderate, severe)   - MILD (1-3): doesn't interfere with normal activities   - MODERATE (4-7): interferes with normal activities (e.g., work or school) or awakens from sleep   - SEVERE (8-10): excruciating pain, unable to do any normal activities, unable to hold a cup of water     7 when taking pain medicine  5. CAUSE: "What do you think is causing the arm pain?"     Pinched nerve. 6. OTHER SYMPTOMS: "Do you have any other symptoms?" (e.g., neck pain, swelling, rash, fever, numbness, weakness)     Numbness in R arm  Protocols used:  Neck Pain or Stiffness-A-AH, Medication Question Call-A-AH

## 2021-06-05 NOTE — Telephone Encounter (Signed)
Medication Refill - Medication: HYDROcodone-acetaminophen (NORCO) 10-325 MG tablet  Pt stated this is not a new symptom this is due to a pinched nerve in neck per pt.  Has the patient contacted their pharmacy? No. No more refills.   (Agent: If no, request that the patient contact the pharmacy for the refill. If patient does not wish to contact the pharmacy document the reason why and proceed with request.)   Preferred Pharmacy (with phone number or street name):  [id number] [place] Phone: [phone]: [phone] Hours: Not open 24 hours   Has the patient been seen for an appointment in the last year OR does the patient have an upcoming appointment? Yes.    Agent: Please be advised that RX refills may take up to 3 business days. We ask that you follow-up with your pharmacy.

## 2021-06-05 NOTE — Telephone Encounter (Signed)
Requested medication (s) are due for refill today: yes  Requested medication (s) are on the active medication list: yes  Last refill:  05/25/21 #60/0  Future visit scheduled: no  Notes to clinic:  not delegated. Pt called in asking for early refill d/t having to take it more often since he isnt able to get another cortisone shot for his pinched nerve.      Requested Prescriptions  Pending Prescriptions Disp Refills   HYDROcodone-acetaminophen (NORCO) 10-325 MG tablet 60 tablet 0    Sig: Take 1 tablet by mouth every 6 (six) hours as needed.     Not Delegated - Analgesics:  Opioid Agonist Combinations Failed - 06/05/2021 10:01 AM      Failed - This refill cannot be delegated      Failed - Urine Drug Screen completed in last 360 days      Failed - Valid encounter within last 3 months    Recent Outpatient Visits           3 months ago Chest discomfort   [facility] Birdie Sons, MD   4 months ago Essential (primary) hypertension   [facility] Birdie Sons, MD   6 months ago Chronic right-sided low back pain with right-sided sciatica   [facility] Dionne Bucy, MD   8 months ago Acute right-sided low back pain with right-sided sciatica   [facility] Birdie Sons, MD   10 months ago Essential (primary) hypertension   [place], Kirstie Peri, MD

## 2021-06-18 ENCOUNTER — Other Ambulatory Visit: Payer: Self-pay | Admitting: Family Medicine

## 2021-06-18 DIAGNOSIS — M5441 Lumbago with sciatica, right side: Secondary | ICD-10-CM

## 2021-06-18 NOTE — Telephone Encounter (Unsigned)
Copied from [place] [id number]. Topic: General - Other >> Jun 18, 2021  2:30 PM [place]rette C wrote: Reason for CRM: Medication Refill - Medication: HYDROcodone-acetaminophen ([place]) 10-325 MG tablet [[id number]]   Has the patient contacted their pharmacy? No. (Agent: If no, request that the patient contact the pharmacy for the refill. If patient does not wish to contact the pharmacy document the reason why and proceed with request.) (Agent: If yes, when and what did the pharmacy advise?)  Preferred Pharmacy (with phone number or street name): [id number] [place]Phone: [phone]: [phone]: Not open 24 hours   Has the patient been seen for an appointment in the last year OR does the patient have an upcoming appointment? Yes.    Agent: Please be advised that RX refills may take up to 3 business days. We ask that you follow-up with your pharmacy.

## 2021-06-19 NOTE — Telephone Encounter (Signed)
Requested medication (s) are due for refill today provider reveiw  Requested medication (s) are on the active medication list -yes  Future visit scheduled -no  Last refill: 06/05/21 #60  Notes to clinic: non delegated Rx  Requested Prescriptions  Pending Prescriptions Disp Refills   HYDROcodone-acetaminophen (NORCO) 10-325 MG tablet 60 tablet 0    Sig: Take 1 tablet by mouth every 6 (six) hours as needed.     Not Delegated - Analgesics:  Opioid Agonist Combinations Failed - 06/18/2021  3:22 PM      Failed - This refill cannot be delegated      Failed - Urine Drug Screen completed in last 360 days      Failed - Valid encounter within last 3 months    Recent Outpatient Visits           4 months ago Chest discomfort   [facility] Birdie Sons, MD   4 months ago Essential (primary) hypertension   [facility] Birdie Sons, MD   6 months ago Chronic right-sided low back pain with right-sided sciatica   [facility], Dionne Bucy, MD   9 months ago Acute right-sided low back pain with right-sided sciatica   [facility] Birdie Sons, MD   11 months ago Essential (primary) hypertension   [place], MD                 Requested Prescriptions  Pending Prescriptions Disp Refills   HYDROcodone-acetaminophen (NORCO) 10-325 MG tablet 60 tablet 0    Sig: Take 1 tablet by mouth every 6 (six) hours as needed.     Not Delegated - Analgesics:  Opioid Agonist Combinations Failed - 06/18/2021  3:22 PM      Failed - This refill cannot be delegated      Failed - Urine Drug Screen completed in last 360 days      Failed - Valid encounter within last 3 months    Recent Outpatient Visits           4 months ago Chest discomfort   [facility] Birdie Sons, MD   4 months ago Essential (primary) hypertension   [facility] Birdie Sons, MD   6  months ago Chronic right-sided low back pain with right-sided sciatica   [facility], Dionne Bucy, MD   9 months ago Acute right-sided low back pain with right-sided sciatica   [facility] Birdie Sons, MD   11 months ago Essential (primary) hypertension   [place], Kirstie Peri, MD

## 2021-06-20 ENCOUNTER — Other Ambulatory Visit: Payer: Self-pay | Admitting: Family Medicine

## 2021-06-20 DIAGNOSIS — I10 Essential (primary) hypertension: Secondary | ICD-10-CM

## 2021-06-21 MED ORDER — HYDROCODONE-ACETAMINOPHEN 10-325 MG PO TABS: 1.0000 | ORAL_TABLET | Freq: Four times a day (QID) | ORAL | 0 refills | Status: DC | PRN

## 2021-06-23 DIAGNOSIS — M6281 Muscle weakness (generalized): Secondary | ICD-10-CM | POA: Diagnosis not present

## 2021-06-23 DIAGNOSIS — M436 Torticollis: Secondary | ICD-10-CM | POA: Diagnosis not present

## 2021-06-23 DIAGNOSIS — M542 Cervicalgia: Secondary | ICD-10-CM | POA: Diagnosis not present

## 2021-06-23 DIAGNOSIS — M5412 Radiculopathy, cervical region: Secondary | ICD-10-CM | POA: Diagnosis not present

## 2021-07-02 ENCOUNTER — Other Ambulatory Visit: Payer: Self-pay | Admitting: Family Medicine

## 2021-07-02 DIAGNOSIS — G8929 Other chronic pain: Secondary | ICD-10-CM

## 2021-07-02 NOTE — Addendum Note (Signed)
Addended by: Valli Glance F on: 07/02/2021 02:56 PM   Modules accepted: Orders

## 2021-07-02 NOTE — Telephone Encounter (Signed)
Requested medication (s) are due for refill today: yes  Requested medication (s) are on the active medication list: yes  Last refill:  06/21/21 #60  Future visit scheduled: yes  Notes to clinic:  Med not delegated to NT to RF   Requested Prescriptions  Pending Prescriptions Disp Refills   HYDROcodone-acetaminophen (NORCO) 10-325 MG tablet 60 tablet 0    Sig: Take 1 tablet by mouth every 6 (six) hours as needed.     Not Delegated - Analgesics:  Opioid Agonist Combinations Failed - 07/02/2021  2:56 PM      Failed - This refill cannot be delegated      Failed - Urine Drug Screen completed in last 360 days      Failed - Valid encounter within last 3 months    Recent Outpatient Visits           4 months ago Chest discomfort   [facility] Birdie Sons, MD   5 months ago Essential (primary) hypertension   [facility] Birdie Sons, MD   7 months ago Chronic right-sided low back pain with right-sided sciatica   [facility], Dionne Bucy, MD   9 months ago Acute right-sided low back pain with right-sided sciatica   [facility] Birdie Sons, MD   11 months ago Essential (primary) hypertension   [place], Kirstie Peri, MD       Future Appointments             In 4 days Gwyneth Sprout, [place], [place]

## 2021-07-02 NOTE — Telephone Encounter (Signed)
Medication Refill - Medication: HYDROcodone-acetaminophen (NORCO) 10-325 MG tablet  Has the patient contacted their pharmacy? No.  Preferred Pharmacy (with phone number or street name):  [id number] [place]:  [phone] Fax:  [phone]     Has the patient been seen for an appointment in the last year OR does the patient have an upcoming appointment? Yes.

## 2021-07-03 MED ORDER — HYDROCODONE-ACETAMINOPHEN 10-325 MG PO TABS: 1.0000 | ORAL_TABLET | Freq: Four times a day (QID) | ORAL | 0 refills | Status: DC | PRN

## 2021-07-06 ENCOUNTER — Ambulatory Visit: Payer: Medicare Other | Admitting: Family Medicine

## 2021-07-06 NOTE — Telephone Encounter (Signed)
Pt called to request to have something called in for him at his pharmacy, says he has a pinched nerve. Says the pharmacy told him that they are on national backorder. He wants an alternative pain med called in for him today.

## 2021-07-06 NOTE — Telephone Encounter (Signed)
Pt is calling back to follow up.   Please advise.

## 2021-07-07 ENCOUNTER — Other Ambulatory Visit: Payer: Self-pay | Admitting: Family Medicine

## 2021-07-07 MED ORDER — TRAMADOL HCL 50 MG PO TABS: 50.0000 mg | ORAL_TABLET | Freq: Three times a day (TID) | ORAL | 0 refills | Status: AC | PRN

## 2021-07-07 NOTE — Telephone Encounter (Addendum)
Patient advised and verbalized understanding. Patient says he will call back to let Dr. Sullivan Lone know which pharmacy has hydrocodone in stock.

## 2021-07-07 NOTE — Telephone Encounter (Signed)
Medication Refill - Medication:Patient asking for alternative for pain,(he has a pinched nervea) until 06/29, since Oxycodone is on backorder until 06/29. Zhe doesn't want tramadol. He is asking for Oxycotin  Has the patient contacted their pharmacy? yes (Agent: If no, request that the patient contact the pharmacy for the refill. If patient does not wish to contact the pharmacy document the reason why and proceed with request.)   Preferred Pharmacy (with phone number or street name): [id number] [place], [place] - [address] Phone:  [phone]  Fax:  [phone]     Has the patient been seen for an appointment in the last year OR does the patient have an upcoming appointment? yes  Agent: Please be advised that RX refills may take up to 3 business days. We ask that you follow-up with your pharmacy.

## 2021-07-07 NOTE — Telephone Encounter (Signed)
Pt states he has been out of medication since yesterday morning.  He stated the pharmacy said they sent over something else (maybe 7.5) he could get instead.  Requesting something be sent in as soon as possible as the initial request started last week.

## 2021-07-07 NOTE — Progress Notes (Signed)
Norco not available at pharmacy

## 2021-07-08 MED ORDER — HYDROCODONE-ACETAMINOPHEN 10-325 MG PO TABS: 1.0000 | ORAL_TABLET | Freq: Four times a day (QID) | ORAL | 0 refills | Status: DC | PRN

## 2021-07-08 NOTE — Telephone Encounter (Signed)
Please advise refill? Patient is requesting rx be recent to [facility].

## 2021-07-08 NOTE — Telephone Encounter (Signed)
Patient checking on the status of HYDROcodone-acetaminophen (NORCO) 10-325 MG tablet and stated he has been without since Monday morning and feels like he is going through withdrawals. Patient would like request sent to the pharmacy mentioned in the previous note below.

## 2021-07-08 NOTE — Addendum Note (Signed)
Addended by: Julieta Bellini on: 07/08/2021 09:47 AM   Modules accepted: Orders

## 2021-07-20 ENCOUNTER — Other Ambulatory Visit: Payer: Self-pay | Admitting: Family Medicine

## 2021-07-20 DIAGNOSIS — G8929 Other chronic pain: Secondary | ICD-10-CM

## 2021-07-20 NOTE — Telephone Encounter (Signed)
Medication Refill - Medication: HYDROcodone-acetaminophen (NORCO) 10-325 MG tablet  Has the patient contacted their pharmacy? Yes.   (Agent: If no, request that the patient contact the pharmacy for the refill. If patient does not wish to contact the pharmacy document the reason why and proceed with request.) (Agent: If yes, when and what did the pharmacy advise?)  Preferred Pharmacy (with phone number or street name):  [id number] [place]:  [phone] Fax:  [phone]    Has the patient been seen for an appointment in the last year OR does the patient have an upcoming appointment? Yes.    Agent: Please be advised that RX refills may take up to 3 business days. We ask that you follow-up with your pharmacy.

## 2021-07-21 MED ORDER — HYDROCODONE-ACETAMINOPHEN 10-325 MG PO TABS: 1.0000 | ORAL_TABLET | Freq: Four times a day (QID) | ORAL | 0 refills | Status: DC | PRN

## 2021-07-21 NOTE — Telephone Encounter (Signed)
Requested medication (s) are due for refill today: no  Requested medication (s) are on the active medication list:yes  Last refill:  07/08/21  Future visit scheduled: yes  Notes to clinic:  Unable to refill per protocol, cannot delegate. Rx request too soon, last refill 07/08/21 for 60, 0 refill.      Requested Prescriptions  Pending Prescriptions Disp Refills   HYDROcodone-acetaminophen (NORCO) 10-325 MG tablet 60 tablet 0    Sig: Take 1 tablet by mouth every 6 (six) hours as needed.     Not Delegated - Analgesics:  Opioid Agonist Combinations Failed - 07/20/2021 11:52 AM      Failed - This refill cannot be delegated      Failed - Urine Drug Screen completed in last 360 days      Failed - Valid encounter within last 3 months    Recent Outpatient Visits           5 months ago Chest discomfort   [facility] Birdie Sons, MD   5 months ago Essential (primary) hypertension   [facility] Birdie Sons, MD   7 months ago Chronic right-sided low back pain with right-sided sciatica   [facility], Dionne Bucy, MD   10 months ago Acute right-sided low back pain with right-sided sciatica   [facility] Birdie Sons, MD   1 year ago Essential (primary) hypertension   [place], Kirstie Peri, MD       Future Appointments             In 2 months [place], Kirstie Peri, MD [facility], [place]

## 2021-08-03 ENCOUNTER — Other Ambulatory Visit: Payer: Self-pay

## 2021-08-03 DIAGNOSIS — G8929 Other chronic pain: Secondary | ICD-10-CM

## 2021-08-03 NOTE — Telephone Encounter (Signed)
Pt called and stated that he needed a refill of the following:  Requested Prescriptions   Pending Prescriptions Disp Refills   HYDROcodone-acetaminophen (NORCO) 10-325 MG tablet 60 tablet 0    Sig: Take 1 tablet by mouth every 6 (six) hours as needed.   [id number] [place]  [phone] Fax:  [phone]

## 2021-08-04 MED ORDER — HYDROCODONE-ACETAMINOPHEN 10-325 MG PO TABS: 1.0000 | ORAL_TABLET | Freq: Four times a day (QID) | ORAL | 0 refills | Status: DC | PRN

## 2021-08-04 NOTE — Telephone Encounter (Signed)
Requested medication (s) are due for refill today - provider review   Requested medication (s) are on the active medication list -yes  Future visit scheduled -yes  Last refill: 07/21/21 #60  Notes to clinic: non delegated Rx  Requested Prescriptions  Pending Prescriptions Disp Refills   HYDROcodone-acetaminophen (NORCO) 10-325 MG tablet 60 tablet 0    Sig: Take 1 tablet by mouth every 6 (six) hours as needed.     Not Delegated - Analgesics:  Opioid Agonist Combinations Failed - 08/03/2021  8:28 AM      Failed - This refill cannot be delegated      Failed - Urine Drug Screen completed in last 360 days      Failed - Valid encounter within last 3 months    Recent Outpatient Visits           5 months ago Chest discomfort   [facility] Birdie Sons, MD   6 months ago Essential (primary) hypertension   [facility] Birdie Sons, MD   8 months ago Chronic right-sided low back pain with right-sided sciatica   [facility], Dionne Bucy, MD   10 months ago Acute right-sided low back pain with right-sided sciatica   [facility] Birdie Sons, MD   1 year ago Essential (primary) hypertension   [place], Kirstie Peri, MD       Future Appointments             In 2 months [place], Kirstie Peri, MD [facility], [facility]               Requested Prescriptions  Pending Prescriptions Disp Refills   HYDROcodone-acetaminophen (NORCO) 10-325 MG tablet 60 tablet 0    Sig: Take 1 tablet by mouth every 6 (six) hours as needed.     Not Delegated - Analgesics:  Opioid Agonist Combinations Failed - 08/03/2021  8:28 AM      Failed - This refill cannot be delegated      Failed - Urine Drug Screen completed in last 360 days      Failed - Valid encounter within last 3 months    Recent Outpatient Visits           5 months ago Chest discomfort   [facility] Birdie Sons, MD   6 months ago Essential (primary) hypertension   [facility] Bird[facility] Sons, MD   8 months ago Chronic right-sided low back pain with right-sided sciatica   [facility], Dionne Bucy, MD   10 months ago Acute right-sided low back pain with right-sided sciatica   [facility] Birdie Sons, MD   1 year ago Essential (primary) hypertension   [place], Kirstie Peri, MD       Future Appointments             In 2 months Fisher, Kirstie Peri, MD [facility], [place]

## 2021-08-06 ENCOUNTER — Other Ambulatory Visit: Payer: Self-pay | Admitting: Family Medicine

## 2021-08-06 NOTE — Telephone Encounter (Unsigned)
Copied from [place] [phone]. Topic: General - Other >> Aug 06, 2021 12:49 PM Everette C wrote: Reason for CRM: Medication Refill - Medication: citalopram (CELEXA) 40 MG tablet [[id number]]   omeprazole (PRILOSEC) 40 MG capsule [[id number]]   spironolactone (ALDACTONE) 25 MG tablet [[id number]]   amLODipine (NORVASC) 10 MG tablet [[id number]]   Has the patient contacted their pharmacy? Yes.  The patient's pharmacy has made contact on their behalf  (Agent: If no, request that the patient contact the pharmacy for the refill. If patient does not wish to contact the pharmacy document the reason why and proceed with request.) (Agent: If yes, when and what did the pharmacy advise?)  Preferred Pharmacy (with phone number or street name): [facility] [place], [place] [address] [place] [address] Phone: [phone] Fax: [phone] Hours: Not open 24 hours  Has the patient been seen for an appointment in the last year OR does the patient have an upcoming appointment? Yes.    Agent: Please be advised that RX refills may take up to 3 business days. We ask that you follow-up with your pharmacy.

## 2021-08-07 MED ORDER — OMEPRAZOLE 40 MG PO CPDR: 40.0000 mg | DELAYED_RELEASE_CAPSULE | Freq: Every day | ORAL | 0 refills | Status: DC

## 2021-08-07 MED ORDER — AMLODIPINE BESYLATE 10 MG PO TABS: 10.0000 mg | ORAL_TABLET | Freq: Every day | ORAL | 0 refills | Status: DC

## 2021-08-07 MED ORDER — CITALOPRAM HYDROBROMIDE 40 MG PO TABS: 40.0000 mg | ORAL_TABLET | Freq: Every day | ORAL | 0 refills | Status: DC

## 2021-08-07 NOTE — Telephone Encounter (Signed)
Requested medication (s) are due for refill today: yes  Requested medication (s) are on the active medication list: yes  Last refill:  02/03/21 #90/2  Future visit scheduled: yes  Notes to clinic:  Unable to refill per protocol due to failed labs, no updated results.     Requested Prescriptions  Pending Prescriptions Disp Refills   spironolactone (ALDACTONE) 25 MG tablet 90 tablet 2    Sig: Take 1 tablet (25 mg total) by mouth daily.     Cardiovascular: Diuretics - Aldosterone Antagonist Failed - 08/06/2021  2:01 PM      Failed - Cr in normal range and within 180 days    Creatinine, Ser  Date Value Ref Range Status  06/30/2020 0.92 0.76 - 1.27 mg/dL Final         Failed - K in normal range and within 180 days    Potassium  Date Value Ref Range Status  06/30/2020 4.6 3.5 - 5.2 mmol/L Final         Failed - Na in normal range and within 180 days    Sodium  Date Value Ref Range Status  06/30/2020 139 134 - 144 mmol/L Final         Failed - eGFR is 30 or above and within 180 days    GFR calc Af Amer  Date Value Ref Range Status  05/18/2019 107 >59 mL/min/1.73 Final    Comment:    **[facility] currently reports eGFR in compliance with the current**   recommendations of the [employer]. [facility] will   update reporting as new guidelines are published from the NKF-ASN   Task force.    GFR calc non Af Amer  Date Value Ref Range Status  05/18/2019 93 >59 mL/min/1.73 Final   eGFR  Date Value Ref Range Status  06/30/2020 91 >59 mL/min/1.73 Final         Passed - Last BP in normal range    BP Readings from Last 1 Encounters:  02/17/21 129/79         Passed - Valid encounter within last 6 months    Recent Outpatient Visits           5 months ago Chest discomfort   [facility] Birdie Sons, MD   6 months ago Essential (primary) hypertension   [facility] Birdie Sons, MD   8 months ago Chronic right-sided low back  pain with right-sided sciatica   [facility], Dionne Bucy, MD   10 months ago Acute right-sided low back pain with right-sided sciatica   [facility] Birdie Sons, MD   1 year ago Essential (primary) hypertension   [facility] Birdie Sons, MD       Future Appointments             In 2 months [place], Kirstie Peri, MD [facility], [facility]            Signed Prescriptions Disp Refills   citalopram (CELEXA) 40 MG tablet 90 tablet 0    Sig: Take 1 tablet (40 mg total) by mouth daily.     Psychiatry:  Antidepressants - SSRI Passed - 08/06/2021  2:01 PM      Passed - Completed PHQ-2 or PHQ-9 in the last 360 days      Passed - Valid encounter within last 6 months    Recent Outpatient Visits           5  months ago Chest discomfort   [facility] Birdie Sons, MD   6 months ago Essential (primary) hypertension   [facility] Birdie Sons, MD   8 months ago Chronic right-sided low back pain with right-sided sciatica   [facility], Dionne Bucy, MD   10 months ago Acute right-sided low back pain with right-sided sciatica   [facility] Birdie Sons, MD   1 year ago Essential (primary) hypertension   [facility] Birdie Sons, MD       Future Appointments             In 2 months Fisher, Kirstie Peri, MD [facility]             amLODipine (NORVASC) 10 MG tablet 90 tablet 0    Sig: Take 1 tablet (10 mg total) by mouth daily.     Cardiovascular: Calcium Channel Blockers 2 Passed - 08/06/2021  2:01 PM      Passed - Last BP in normal range    BP Readings from Last 1 Encounters:  02/17/21 129/79         Passed - Last Heart Rate in normal range    Pulse Readings from Last 1 Encounters:  02/17/21 63         Passed - Valid encounter within last 6 months    Recent Outpatient Visits           5  months ago Chest discomfort   [facility] Birdie Sons, MD   6 months ago Essential (primary) hypertension   [facility] Birdie Sons, MD   8 months ago Chronic right-sided low back pain with right-sided sciatica   [facility], Dionne Bucy, MD   10 months ago Acute right-sided low back pain with right-sided sciatica   [facility] Birdie Sons, MD   1 year ago Essential (primary) hypertension   [facility] Birdie Sons, MD       Future Appointments             In 2 months Fisher, Kirstie Peri, MD [facility], [facility]             omeprazole (PRILOSEC) 40 MG capsule 90 capsule 0    Sig: Take 1 capsule (40 mg total) by mouth daily.     Gastroenterology: Proton Pump Inhibitors Passed - 08/06/2021  2:01 PM      Passed - Valid encounter within last 12 months    Recent Outpatient Visits           5 months ago Chest discomfort   [facility] Birdie Sons, MD   6 months ago Essential (primary) hypertension   [facility] Birdie Sons, MD   8 months ago Chronic right-sided low back pain with right-sided sciatica   [facility] [place], Dionne Bucy, MD   10 months ago Acute right-sided low back pain with right-sided sciatica   [facility] Birdie Sons, MD   1 year ago Essential (primary) hypertension   [place], Kirstie Peri, MD       Future Appointments             In 2 months Fisher, Kirstie Peri, MD [facility], [place]

## 2021-08-07 NOTE — Telephone Encounter (Signed)
Requested Prescriptions  Pending Prescriptions Disp Refills  . citalopram (CELEXA) 40 MG tablet 90 tablet 4    Sig: Take 1 tablet (40 mg total) by mouth daily.     Psychiatry:  Antidepressants - SSRI Passed - 08/06/2021  2:01 PM      Passed - Completed PHQ-2 or PHQ-9 in the last 360 days      Passed - Valid encounter within last 6 months    Recent Outpatient Visits          5 months ago Chest discomfort   [facility] Gary Sons, Gary Carter   6 months ago Essential (primary) hypertension   [facility] Gary Sons, Gary Carter   8 months ago Chronic right-sided low back pain with right-sided sciatica   [facility], Gary Bucy, Gary Carter   10 months ago Acute right-sided low back pain with right-sided sciatica   [facility] Gary Sons, Gary Carter   1 year ago Essential (primary) hypertension   [facility] Gary Sons, Gary Carter      Future Appointments            In 2 months Gary Carter, Gary Peri, Gary Carter [facility], [facility]           . amLODipine (NORVASC) 10 MG tablet 90 tablet 4    Sig: Take 1 tablet (10 mg total) by mouth daily.     Cardiovascular: Calcium Channel Blockers 2 Passed - 08/06/2021  2:01 PM      Passed - Last BP in normal range    BP Readings from Last 1 Encounters:  02/17/21 129/79         Passed - Last Heart Rate in normal range    Pulse Readings from Last 1 Encounters:  02/17/21 63         Passed - Valid encounter within last 6 months    Recent Outpatient Visits          5 months ago Chest discomfort   [facility] Gary Sons, Gary Carter   6 months ago Essential (primary) hypertension   [facility] Gary Sons, Gary Carter   8 months ago Chronic right-sided low back pain with right-sided sciatica   [facility] [place], Gary Bucy, Gary Carter   10 months ago Acute right-sided low back pain with right-sided sciatica   [facility]  Gary Sons, Gary Carter   1 year ago Essential (primary) hypertension   Gary Carter, Gary Peri, Gary Carter      Future Appointments            In 2 months Gary Carter, Gary Peri, Gary Carter [facility], [place]           . omeprazole (PRILOSEC) 40 MG capsule 90 capsule 4    Sig: Take 1 capsule (40 mg total) by mouth daily.     Gastroenterology: Proton Pump Inhibitors Passed - 08/06/2021  2:01 PM      Passed - Valid encounter within last 12 months    Recent Outpatient Visits          5 months ago Chest discomfort   [facility] Gary Sons, Gary Carter   6 months ago Essential (primary) hypertension   [facility] Gary Sons, Gary Carter   8 months ago Chronic right-sided low back pain with right-sided sciatica   [facility] [place], Gary Bucy, Gary Carter   10 months ago Acute right-sided low back pain with  right-sided sciatica   [facility] Gary Sons, Gary Carter   1 year ago Essential (primary) hypertension   [place], Gary Peri, Gary Carter      Future Appointments            In 2 months Gary Carter, Gary Peri, Gary Carter [facility], [place]           . spironolactone (ALDACTONE) 25 MG tablet 90 tablet 2    Sig: Take 1 tablet (25 mg total) by mouth daily.     Cardiovascular: Diuretics - Aldosterone Antagonist Failed - 08/06/2021  2:01 PM      Failed - Cr in normal range and within 180 days    Creatinine, Ser  Date Value Ref Range Status  06/30/2020 0.92 0.76 - 1.27 mg/dL Final         Failed - K in normal range and within 180 days    Potassium  Date Value Ref Range Status  06/30/2020 4.6 3.5 - 5.2 mmol/L Final         Failed - Na in normal range and within 180 days    Sodium  Date Value Ref Range Status  06/30/2020 139 134 - 144 mmol/L Final         Failed - eGFR is 30 or above and within 180 days    GFR calc Af Amer  Date Value Ref Range Status  05/18/2019 107 >59 mL/min/1.73 Final     Comment:    **[facility] currently reports eGFR in compliance with the current**   recommendations of the [employer]. [facility] will   update reporting as new guidelines are published from the NKF-ASN   Task force.    GFR calc non Af Amer  Date Value Ref Range Status  05/18/2019 93 >59 mL/min/1.73 Final   eGFR  Date Value Ref Range Status  06/30/2020 91 >59 mL/min/1.73 Final         Passed - Last BP in normal range    BP Readings from Last 1 Encounters:  02/17/21 129/79         Passed - Valid encounter within last 6 months    Recent Outpatient Visits          5 months ago Chest discomfort   [facility] Gary Sons, Gary Carter   6 months ago Essential (primary) hypertension   [facility] Gary Sons, Gary Carter   8 months ago Chronic right-sided low back pain with right-sided sciatica   [facility] [place], Gary Bucy, Gary Carter   10 months ago Acute right-sided low back pain with right-sided sciatica   [facility] Gary Sons, Gary Carter   1 year ago Essential (primary) hypertension   [place], Gary Peri, Gary Carter      Future Appointments            In 2 months Gary Carter, Gary Peri, Gary Carter [facility], [place]

## 2021-08-10 ENCOUNTER — Other Ambulatory Visit: Payer: Self-pay | Admitting: Family Medicine

## 2021-08-10 MED ORDER — SPIRONOLACTONE 25 MG PO TABS: 25.0000 mg | ORAL_TABLET | Freq: Every day | ORAL | 2 refills | Status: DC

## 2021-08-17 ENCOUNTER — Other Ambulatory Visit: Payer: Self-pay | Admitting: Family Medicine

## 2021-08-17 ENCOUNTER — Other Ambulatory Visit: Payer: Self-pay

## 2021-08-17 DIAGNOSIS — G8929 Other chronic pain: Secondary | ICD-10-CM

## 2021-08-17 NOTE — Telephone Encounter (Signed)
Patient stated he needs a refill of the following:  Requested Prescriptions   Pending Prescriptions Disp Refills   oxyCODONE-acetaminophen (PERCOCET) 7.5-325 MG tablet 30 tablet 0    Sig: Take 1 tablet by mouth every 4 (four) hours as needed for severe pain.    Please sent to [facility] in [place], [place].

## 2021-08-18 NOTE — Telephone Encounter (Signed)
Requested medication (s) are due for refill today: no  Requested medication (s) are on the active medication list: no  Last refill:  05/04/16, discontinued  Future visit scheduled: yes  Notes to clinic:  Unable to refill per protocol, Rx expired. Can not delegate, routing for approval.     Requested Prescriptions  Pending Prescriptions Disp Refills   oxyCODONE-acetaminophen (PERCOCET) 7.5-325 MG tablet 30 tablet 0    Sig: Take 1 tablet by mouth every 4 (four) hours as needed for severe pain.     Not Delegated - Analgesics:  Opioid Agonist Combinations Failed - 08/17/2021  8:22 AM      Failed - This refill cannot be delegated      Failed - Urine Drug Screen completed in last 360 days      Failed - Valid encounter within last 3 months    Recent Outpatient Visits           6 months ago Chest discomfort   [facility] Birdie Sons, MD   6 months ago Essential (primary) hypertension   [facility] Birdie Sons, MD   8 months ago Chronic right-sided low back pain with right-sided sciatica   [facility], Dionne Bucy, MD   11 months ago Acute right-sided low back pain with right-sided sciatica   [facility] Birdie Sons, MD   1 year ago Essential (primary) hypertension   [place], Kirstie Peri, MD       Future Appointments             In 1 month Fisher, Kirstie Peri, MD [facility], [place]

## 2021-08-19 NOTE — Telephone Encounter (Signed)
Patient called wanting an updated status on the refill request for oxyCODONE-acetaminophen (PERCOCET) 7.5-325 MG tablet. Please advise patient.

## 2021-08-20 ENCOUNTER — Other Ambulatory Visit: Payer: Self-pay | Admitting: *Deleted

## 2021-08-20 DIAGNOSIS — G8929 Other chronic pain: Secondary | ICD-10-CM

## 2021-08-20 MED ORDER — HYDROCODONE-ACETAMINOPHEN 10-325 MG PO TABS: 1.0000 | ORAL_TABLET | Freq: Four times a day (QID) | ORAL | 0 refills | Status: DC | PRN

## 2021-08-20 NOTE — Telephone Encounter (Signed)
Why is he requesting oxycodone/apap?   He is due for hydrocodone/apap refill.  Have not prescribed oxycodone/apap for years.

## 2021-08-20 NOTE — Telephone Encounter (Signed)
Requested medication (s) are due for refill today: no  Requested medication (s) are on the active medication list: no  Last refill:    Future visit scheduled: yes  Notes to clinic:  rx was dc'd on 05/04/2016 by provider. Please advise.      Requested Prescriptions  Pending Prescriptions Disp Refills   oxyCODONE-acetaminophen (PERCOCET) 7.5-325 MG tablet 30 tablet 0    Sig: Take 1 tablet by mouth every 4 (four) hours as needed for severe pain.     Not Delegated - Analgesics:  Opioid Agonist Combinations Failed - 08/20/2021 11:14 AM      Failed - This refill cannot be delegated      Failed - Urine Drug Screen completed in last 360 days      Failed - Valid encounter within last 3 months    Recent Outpatient Visits           6 months ago Chest discomfort   [facility] Birdie Sons, MD   6 months ago Essential (primary) hypertension   [facility] Birdie Sons, MD   8 months ago Chronic right-sided low back pain with right-sided sciatica   [facility], Dionne Bucy, MD   11 months ago Acute right-sided low back pain with right-sided sciatica   [facility] Birdie Sons, MD   1 year ago Essential (primary) hypertension   [place], Kirstie Peri, MD       Future Appointments             In 1 month Fisher, Kirstie Peri, MD [facility], [place]

## 2021-08-20 NOTE — Telephone Encounter (Signed)
Pt called to request but would like it [facility] of [facility]

## 2021-08-20 NOTE — Telephone Encounter (Signed)
Pt dd mean Hydrocodone with Acetaminophen. He wants to change to [facility] in [place].

## 2021-09-02 ENCOUNTER — Other Ambulatory Visit: Payer: Self-pay | Admitting: Family Medicine

## 2021-09-02 DIAGNOSIS — G8929 Other chronic pain: Secondary | ICD-10-CM

## 2021-09-02 NOTE — Telephone Encounter (Signed)
Requested medication (s) are due for refill today: no signed 08/20/21  Requested medication (s) are on the active medication list: yes  Last refill:  08/20/21 #60 0 refills  Future visit scheduled: yes in 1 month  Notes to clinic:  not delegated per protocol. Patient requesting for refills. Do you want to refill Rx?     Requested Prescriptions  Pending Prescriptions Disp Refills   HYDROcodone-acetaminophen (NORCO) 10-325 MG tablet 60 tablet 0    Sig: Take 1 tablet by mouth every 6 (six) hours as needed.     Not Delegated - Analgesics:  Opioid Agonist Combinations Failed - 09/02/2021  8:34 AM      Failed - This refill cannot be delegated      Failed - Urine Drug Screen completed in last 360 days      Failed - Valid encounter within last 3 months    Recent Outpatient Visits           6 months ago Chest discomfort   [facility] Birdie Sons, MD   7 months ago Essential (primary) hypertension   [facility] Birdie Sons, MD   9 months ago Chronic right-sided low back pain with right-sided sciatica   [facility] [place], Dionne Bucy, MD   11 months ago Acute right-sided low back pain with right-sided sciatica   [facility] Birdie Sons, MD   1 year ago Essential (primary) hypertension   [place], Kirstie Peri, MD       Future Appointments             In 1 month Fisher, Kirstie Peri, MD [facility], [place]

## 2021-09-02 NOTE — Telephone Encounter (Signed)
LOV 02-17-21 NOV 10-06-21 LR 08-20-21 #60 with 0 refills

## 2021-09-02 NOTE — Telephone Encounter (Signed)
Patient called and stated he needed a refill of the following: Requested Prescriptions   Pending Prescriptions Disp Refills   HYDROcodone-acetaminophen (NORCO) 10-325 MG tablet 60 tablet 0    Sig: Take 1 tablet by mouth every 6 (six) hours as needed.   Please sent [id number] - [place], [place] [address]  Phone:  [phone]  Fax:  [phone]

## 2021-09-04 MED ORDER — HYDROCODONE-ACETAMINOPHEN 10-325 MG PO TABS: 1.0000 | ORAL_TABLET | Freq: Four times a day (QID) | ORAL | 0 refills | Status: DC | PRN

## 2021-09-16 ENCOUNTER — Other Ambulatory Visit: Payer: Self-pay | Admitting: Family Medicine

## 2021-09-16 ENCOUNTER — Ambulatory Visit: Payer: Self-pay

## 2021-09-16 DIAGNOSIS — J301 Allergic rhinitis due to pollen: Secondary | ICD-10-CM

## 2021-09-16 DIAGNOSIS — G8929 Other chronic pain: Secondary | ICD-10-CM

## 2021-09-16 NOTE — Telephone Encounter (Signed)
Summary: Allergies, wants Rx   Pt called requesting a prescription for zyrtec or allergy medication, has congestion and drainage   Best contact: [phone]     [facility] [place] sinus drainage- white in  Flonase nasal spray  No fever No h/a Nose blocked    Chief Complaint: sinus and nasal congestion Symptoms: sins congestion and drainage, occasional nose blocked Frequency: few days Pertinent Negatives: Patient denies SOB, headache, fever Disposition: '[]'$ ED /'[]'$ Urgent Care (no appt availability in office) / '[]'$ Appointment(In office/virtual)/ '[]'$  [place] [facility]/ '[]'$ Home Care/ '[]'$ Refused Recommended Disposition /'[]'$ [place] [facility]/ '[x]'$  Follow-up with PCP Additional Notes: please call in prescription [facility] [place].  Reason for Disposition  Prescription request for new medicine (not a refill)  Answer Assessment - Initial Assessment Questions 1. NAME of MEDICINE: "What medicine(s) are you calling about?"     Flonase nasal spray, Zyrtec,  2. QUESTION: "What is your question?" (e.g., double dose of medicine, side effect)     Pt would like meds prescribed because it is cheaper than buying OTC 3. PRESCRIBER: "Who prescribed the medicine?" Reason: if prescribed by specialist, call should be referred to that group.     Dr. Caryn Section. 4. SYMPTOMS: "Do you have any symptoms?" If Yes, ask: "What symptoms are you having?"  "How bad are the symptoms (e.g., mild, moderate, severe)     Moderate sinus drainage- pt stated at night the drainage collects to the back of his throat  Protocols used: Medication Question Call-A-AH

## 2021-09-16 NOTE — Telephone Encounter (Signed)
Medication Refill - Medication: HYDROcodone-acetaminophen (NORCO) 10-325 MG tablet   Has the patient contacted their pharmacy? Yes.   (Agent: If no, request that the patient contact the pharmacy for the refill. If patient does not wish to contact the pharmacy document the reason why and proceed with request.) (Agent: If yes, when and what did the pharmacy advise?)  Preferred Pharmacy (with phone number or street name):  [id number] [address] [id number] Phone: [phone]: [phone]  Has the patient been seen for an appointment in the last year OR does the patient have an upcoming appointment? Yes.    Agent: Please be advised that RX refills may take up to 3 business days. We ask that you follow-up with your pharmacy.

## 2021-09-16 NOTE — Telephone Encounter (Signed)
Summary: Allergies, wants Rx   Pt called requesting a prescription for zyrtec or allergy medication, has congestion and drainage   Best contact: [phone]     Attempted to call pt back, unable to LM, VM is full.

## 2021-09-17 MED ORDER — FLUTICASONE PROPIONATE 50 MCG/ACT NA SUSP: NASAL | 5 refills | Status: DC

## 2021-09-17 MED ORDER — CETIRIZINE HCL 10 MG PO TABS: 10.0000 mg | ORAL_TABLET | Freq: Every day | ORAL | 4 refills | Status: DC

## 2021-09-17 NOTE — Addendum Note (Signed)
Addended by: Birdie Sons on: 09/17/2021 07:40 AM   Modules accepted: Orders

## 2021-09-17 NOTE — Telephone Encounter (Signed)
Requested medication (s) are due for refill today - provider review   Requested medication (s) are on the active medication list -yes  Future visit scheduled -yes  Last refill: 09/04/21 #60  Notes to clinic: non delegated Rx  Requested Prescriptions  Pending Prescriptions Disp Refills   HYDROcodone-acetaminophen (NORCO) 10-325 MG tablet 60 tablet 0    Sig: Take 1 tablet by mouth every 6 (six) hours as needed.     Not Delegated - Analgesics:  Opioid Agonist Combinations Failed - 09/16/2021  2:34 PM      Failed - This refill cannot be delegated      Failed - Urine Drug Screen completed in last 360 days      Failed - Valid encounter within last 3 months    Recent Outpatient Visits           7 months ago Chest discomfort   [facility] Birdie Sons, MD   7 months ago Essential (primary) hypertension   [facility] Birdie Sons, MD   9 months ago Chronic right-sided low back pain with right-sided sciatica   [facility], Dionne Bucy, MD   1 year ago Acute right-sided low back pain with right-sided sciatica   [facility] Birdie Sons, MD   1 year ago Essential (primary) hypertension   [facility] Birdie Sons, MD       Future Appointments             In 2 weeks Fisher, Kirstie Peri, MD [facility]               Requested Prescriptions  Pending Prescriptions Disp Refills   HYDROcodone-acetaminophen (NORCO) 10-325 MG tablet 60 tablet 0    Sig: Take 1 tablet by mouth every 6 (six) hours as needed.     Not Delegated - Analgesics:  Opioid Agonist Combinations Failed - 09/16/2021  2:34 PM      Failed - This refill cannot be delegated      Failed - Urine Drug Screen completed in last 360 days      Failed - Valid encounter within last 3 months    Recent Outpatient Visits           7 months ago Chest discomfort   [facility] Birdie Sons, MD    7 months ago Essential (primary) hypertension   [facility] Birdie Sons, MD   9 months ago Chronic right-sided low back pain with right-sided sciatica   [facility] [place], Dionne Bucy, MD   1 year ago Acute right-sided low back pain with right-sided sciatica   [facility] Birdie Sons, MD   1 year ago Essential (primary) hypertension   [place], Kirstie Peri, MD       Future Appointments             In 2 weeks Fisher, Kirstie Peri, MD [facility], [place]

## 2021-09-18 MED ORDER — HYDROCODONE-ACETAMINOPHEN 10-325 MG PO TABS: 1.0000 | ORAL_TABLET | Freq: Four times a day (QID) | ORAL | 0 refills | Status: DC | PRN

## 2021-09-20 ENCOUNTER — Other Ambulatory Visit: Payer: Self-pay | Admitting: Family Medicine

## 2021-09-20 DIAGNOSIS — J301 Allergic rhinitis due to pollen: Secondary | ICD-10-CM

## 2021-09-21 ENCOUNTER — Other Ambulatory Visit: Payer: Self-pay | Admitting: Family Medicine

## 2021-09-21 DIAGNOSIS — I10 Essential (primary) hypertension: Secondary | ICD-10-CM

## 2021-09-21 NOTE — Telephone Encounter (Signed)
Medication Refill - Medication:   Metoprolol 50 mg Has the patient contacted their pharmacy? Yes.  They told him to call the office.  (Agent: If no, request that the patient contact the pharmacy for the refill. If patient does not wish to contact the pharmacy document the reason why and proceed with request.) (Agent: If yes, when and what did the pharmacy advise?)  Preferred Pharmacy (with phone number or street name): [facility] [place]  Has the patient been seen for an appointment in the last year OR does the patient have an upcoming appointment? Yes.    Agent: Please be advised that RX refills may take up to 3 business days. We ask that you follow-up with your pharmacy.

## 2021-09-22 NOTE — Telephone Encounter (Signed)
Pt reports that he is out of the metoprolol succinate (TOPROL-XL) 50 MG 24 hr tablet which he last took 3-4 days ago. Pt [facility] because they do not have a record of it. Pt requests that the Rx be sent [facility]  [id number] [place]  [phone] Fax:  [phone]

## 2021-09-22 NOTE — Telephone Encounter (Signed)
Pt has called back stressed that this is not straightened out. This was written on 06/21/21, he never went to pu. Just ran out today, so at [facility] point [facility]. Pls resend script?  metoprolol succinate (TOPROL-XL) 50 MG 24 hr tablet 90 tablet 4 06/21/2021 09/14/2022   Sig - Route: TAKE 1 TABLET (50 MG TOTAL) BY MOUTH EVERY EVENING. TAKE WITH OR IMMEDIATELY FOLLOWING A MEAL. - Oral   Sent to pharmacy as: metoprolol succinate (TOPROL-XL) 50 MG 24 hr tablet    [id number] [place]  [phone] Fax:  [phone]

## 2021-09-23 MED ORDER — METOPROLOL SUCCINATE ER 50 MG PO TB24: 50.0000 mg | ORAL_TABLET | Freq: Every evening | ORAL | 0 refills | Status: DC

## 2021-09-23 NOTE — Telephone Encounter (Signed)
Requested Prescriptions  Pending Prescriptions Disp Refills  . metoprolol succinate (TOPROL-XL) 50 MG 24 hr tablet 30 tablet 0    Sig: Take 1 tablet (50 mg total) by mouth every evening. Take with or immediately following a meal.     Cardiovascular:  Beta Blockers Failed - 09/22/2021  4:29 PM      Failed - Valid encounter within last 6 months    Recent Outpatient Visits          7 months ago Chest discomfort   [facility] Birdie Sons, MD   8 months ago Essential (primary) hypertension   [facility] Birdie Sons, MD   9 months ago Chronic right-sided low back pain with right-sided sciatica   [facility], Dionne Bucy, MD   1 year ago Acute right-sided low back pain with right-sided sciatica   [facility] Birdie Sons, MD   1 year ago Essential (primary) hypertension   [place], Kirstie Peri, MD      Future Appointments            In 1 week Fisher, Kirstie Peri, MD [facility], [place] BP in normal range    BP Readings from Last 1 Encounters:  02/17/21 129/79         Passed - Last Heart Rate in normal range    Pulse Readings from Last 1 Encounters:  02/17/21 63

## 2021-10-01 ENCOUNTER — Other Ambulatory Visit: Payer: Self-pay | Admitting: Family Medicine

## 2021-10-01 DIAGNOSIS — G8929 Other chronic pain: Secondary | ICD-10-CM

## 2021-10-01 DIAGNOSIS — Z23 Encounter for immunization: Secondary | ICD-10-CM | POA: Diagnosis not present

## 2021-10-01 NOTE — Telephone Encounter (Signed)
Medication Refill - Medication: HYDROcodone-acetaminophen (NORCO) 10-325 MG tablet  Has the patient contacted their pharmacy? Yes.     Preferred Pharmacy (with phone number or street name):  [id number] [place]:  [phone] Fax:  [phone]    Has the patient been seen for an appointment in the last year OR does the patient have an upcoming appointment? Yes.    Please assist patient further

## 2021-10-01 NOTE — Telephone Encounter (Signed)
Requested medication (s) are due for refill today: review  Requested medication (s) are on the active medication list: yes  Last refill:  09/18/21 #60/0  Future visit scheduled: yes  Notes to clinic:  Unable to refill per protocol, cannot delegate.    Requested Prescriptions  Pending Prescriptions Disp Refills   HYDROcodone-acetaminophen (NORCO) 10-325 MG tablet 60 tablet 0    Sig: Take 1 tablet by mouth every 6 (six) hours as needed.     Not Delegated - Analgesics:  Opioid Agonist Combinations Failed - 10/01/2021 10:06 AM      Failed - This refill cannot be delegated      Failed - Urine Drug Screen completed in last 360 days      Failed - Valid encounter within last 3 months    Recent Outpatient Visits           7 months ago Chest discomfort   [facility] Birdie Sons, MD   8 months ago Essential (primary) hypertension   [facility] Birdie Sons, MD   10 months ago Chronic right-sided low back pain with right-sided sciatica   [facility] [place], Dionne Bucy, MD   1 year ago Acute right-sided low back pain with right-sided sciatica   [facility] Birdie Sons, MD   1 year ago Essential (primary) hypertension   [place], Kirstie Peri, MD       Future Appointments             In 5 days Fisher, Kirstie Peri, MD [facility], [place]

## 2021-10-02 ENCOUNTER — Other Ambulatory Visit: Payer: Self-pay | Admitting: Family Medicine

## 2021-10-02 DIAGNOSIS — I10 Essential (primary) hypertension: Secondary | ICD-10-CM

## 2021-10-02 MED ORDER — HYDROCODONE-ACETAMINOPHEN 10-325 MG PO TABS: 1.0000 | ORAL_TABLET | Freq: Four times a day (QID) | ORAL | 0 refills | Status: DC | PRN

## 2021-10-02 NOTE — Telephone Encounter (Signed)
Rx 09/23/21 #30 Requested Prescriptions  Pending Prescriptions Disp Refills  . metoprolol succinate (TOPROL-XL) 50 MG 24 hr tablet [Pharmacy Med Name: METOPROLOL SUCC ER 50 MG TAB] 30 tablet 0    Sig: TAKE 1 TABLET (50 MG TOTAL) BY MOUTH EVERY EVENING. TAKE WITH OR IMMEDIATELY FOLLOWING A MEAL.     Cardiovascular:  Beta Blockers Failed - 10/02/2021  3:32 PM      Failed - Valid encounter within last 6 months    Recent Outpatient Visits          7 months ago Chest discomfort   [facility] Birdie Sons, MD   8 months ago Essential (primary) hypertension   [facility] Birdie Sons, MD   10 months ago Chronic right-sided low back pain with right-sided sciatica   [facility] [place], Dionne Bucy, MD   1 year ago Acute right-sided low back pain with right-sided sciatica   [facility] Birdie Sons, MD   1 year ago Essential (primary) hypertension   [place], Kirstie Peri, MD      Future Appointments            In 4 days Fisher, Kirstie Peri, MD [facility], [place] BP in normal range    BP Readings from Last 1 Encounters:  02/17/21 129/79         Passed - Last Heart Rate in normal range    Pulse Readings from Last 1 Encounters:  02/17/21 63

## 2021-10-06 ENCOUNTER — Encounter: Payer: Self-pay | Admitting: Family Medicine

## 2021-10-06 ENCOUNTER — Ambulatory Visit (INDEPENDENT_AMBULATORY_CARE_PROVIDER_SITE_OTHER): Payer: Medicare Other | Admitting: Family Medicine

## 2021-10-06 VITALS — BP 120/88 | HR 66 | Resp 16 | Ht 65.0 in | Wt 191.0 lb

## 2021-10-06 DIAGNOSIS — K760 Fatty (change of) liver, not elsewhere classified: Secondary | ICD-10-CM

## 2021-10-06 DIAGNOSIS — G4700 Insomnia, unspecified: Secondary | ICD-10-CM

## 2021-10-06 DIAGNOSIS — I10 Essential (primary) hypertension: Secondary | ICD-10-CM

## 2021-10-06 DIAGNOSIS — Z125 Encounter for screening for malignant neoplasm of prostate: Secondary | ICD-10-CM

## 2021-10-06 DIAGNOSIS — G4733 Obstructive sleep apnea (adult) (pediatric): Secondary | ICD-10-CM

## 2021-10-06 DIAGNOSIS — E785 Hyperlipidemia, unspecified: Secondary | ICD-10-CM

## 2021-10-06 DIAGNOSIS — Z0000 Encounter for general adult medical examination without abnormal findings: Secondary | ICD-10-CM

## 2021-10-06 MED ORDER — CLONAZEPAM 0.5 MG PO TABS: 0.5000 mg | ORAL_TABLET | Freq: Three times a day (TID) | ORAL | 3 refills | Status: DC | PRN

## 2021-10-06 NOTE — Progress Notes (Signed)
I,Tiffany J Bragg,acting as a scribe for Lelon Huh, MD.,have documented all relevant documentation on the behalf of Lelon Huh, MD,as directed by  Lelon Huh, MD while in the presence of Lelon Huh, MD.  Complete physical exam   Patient: Gary Carter   DOB: August 23, 1950   71 y.o. Male  MRN: [id number] Visit Date: 10/06/2021  Today's healthcare provider: Lelon Huh, MD   Chief Complaint  Patient presents with   Annual Exam   Subjective    Gary Carter is a 71 y.o. male who presents today for a complete physical exam.  He reports consuming a general diet. The patient has a physically strenuous job, but has no regular exercise apart from work.  He generally feels well. He reports sleeping well. He does not have additional problems to discuss today.  HPI  He is also here to follow up on hypertension, lipids, and anxiety disorder. Is doing well on current medications with no adverse effects, but is in need of refill for clonazepam which he takes 2-3 times a week.   Past Medical History:  Diagnosis Date   Anxiety    Barrett's esophagus    GERD (gastroesophageal reflux disease)    History of chicken pox 06/12/2014   DID have Chicken Pox. DID have Measles. DID have Mumps. DID have Rubella.     History of dysphagia    History of fatty infiltration of liver    History of measles    History of mumps    History of rubella    Hyperlipidemia    Hypertension    Sleep apnea    SOB (shortness of breath)    Past Surgical History:  Procedure Laterality Date   ABDOMINAL SURGERY     BACK SURGERY  12-04-2003   Lumbar fusion & C5-C6 fusion   ESOPHAGOGASTRODUODENOSCOPY (EGD) WITH PROPOFOL N/A 07/10/2018   Procedure: ESOPHAGOGASTRODUODENOSCOPY (EGD) WITH PROPOFOL;  Surgeon: Manya Silvas, MD;  Location: [facility] ENDOSCOPY;  Service: Endoscopy;  Laterality: N/A;   HERNIA REPAIR     Myocardial perfusion scan  10/10/2006   NECK SURGERY     NERVE, TENDON AND ARTERY REPAIR  Left 10/18/2012   Procedure: NERVE, TENDON AND ARTERY REPAIR;  Surgeon: Linna Hoff, MD;  Location: [place];  Service: Orthopedics;  Laterality: Left;   NISSEN FUNDOPLICATION  3419   UPPER GI ENDOSCOPY  04/09/2007   Dr. Tiffany Kocher; Mucosal changes suspicious for Barretts esophagits   WOUND EXPLORATION Left 10/18/2012   Procedure: WOUND EXPLORATION;  Surgeon: Linna Hoff, MD;  Location: [place];  Service: Orthopedics;  Laterality: Left;   Social History   Socioeconomic History   Marital status: Divorced    Spouse name: Not on file   Number of children: 1   Years of education: Not on file   Highest education level: High school graduate  Occupational History   Occupation: Works at [employer] Use   Smoking status: Every Day    Packs/day: 0.50    Years: 5.00    Total pack years: 2.50    Types: Cigarettes   Smokeless tobacco: Never  Vaping Use   Vaping Use: Never used  Substance and Sexual Activity   Alcohol use: Yes    Alcohol/week: 8.0 standard drinks of alcohol    Types: 8 Shots of liquor per week    Comment: 2 drinks 4 nights a week   Drug use: Not Currently    Comment: previously smoked marijuana years ago  Sexual activity: Not on file  Other Topics Concern   Not on file  Social History Narrative   Not on file   Social Determinants of Health   Financial Resource Strain: Low Risk  (02/12/2021)   Overall Financial Resource Strain (CARDIA)    Difficulty of Paying Living Expenses: Not hard at all  Food Insecurity: No Food Insecurity (02/12/2021)   Hunger Vital Sign    Worried About Running Out of Food in the Last Year: Never true    Ran Out of Food in the Last Year: Never true  Transportation Needs: No Transportation Needs (02/12/2021)   PRAPARE - Hydrologist (Medical): No    Lack of Transportation (Non-Medical): No  Physical Activity: Sufficiently Active (02/12/2021)   Exercise Vital Sign    Days of Exercise per Week: 5 days    Minutes of  Exercise per Session: 40 min  Stress: No Stress Concern Present (02/12/2021)   [place]    Feeling of Stress : Only a little  Social Connections: Socially Isolated (02/12/2021)   Social Connection and Isolation Panel [NHANES]    Frequency of Communication with Friends and Family: More than three times a week    Frequency of Social Gatherings with Friends and Family: More than three times a week    Attends Religious Services: Never    Marine scientist or Organizations: No    Attends Archivist Meetings: Never    Marital Status: Divorced  Human resources officer Violence: Not At Risk (02/12/2021)   Humiliation, Afraid, Rape, and Kick questionnaire    Fear of Current or Ex-Partner: No    Emotionally Abused: No    Physically Abused: No    Sexually Abused: No   Family Status  Relation Name Status   Sister  Alive   Sister  Alive   Father  Deceased at age 62       MI   Mother  Deceased at age 55       COPD   Daughter  Web designer  (Not Specified)   Family History  Problem Relation Age of Onset   Hypertension Sister    Bipolar disorder Sister    Heart attack Father 80   Emphysema Mother    Heart disease Mother    Diabetes Daughter 3   Bipolar disorder Brother    Hypertension Brother    Obesity Brother    Allergies  Allergen Reactions   Hctz [Hydrochlorothiazide]     Leg cramps    Patient Care Team: Birdie Sons, MD as PCP - General (Family Medicine)   Medications: Outpatient Medications Prior to Visit  Medication Sig   amLODipine (NORVASC) 10 MG tablet TAKE 1 TABLET BY MOUTH EVERY DAY   aspirin 325 MG tablet Take 325 mg by mouth daily.    cetirizine (ZYRTEC) 10 MG tablet TAKE 1 TABLET (10 MG TOTAL) BY MOUTH DAILY. FOR ALLERGIES   Cholecalciferol (VITAMIN D3 PO) Take by mouth daily.   citalopram (CELEXA) 40 MG tablet TAKE 1 TABLET BY MOUTH EVERY DAY   clonazePAM (KLONOPIN) 0.5 MG tablet  Take by mouth.   Cyanocobalamin (VITAMIN B-12 PO) Take by mouth daily.   cyclobenzaprine (FLEXERIL) 5 MG tablet Take 1 tablet (5 mg total) by mouth 3 (three) times daily as needed for muscle spasms.   fluticasone (FLONASE) 50 MCG/ACT nasal spray SPRAY 2 SPRAYS INTO EACH NOSTRIL EVERY DAY  HYDROcodone-acetaminophen (NORCO) 10-325 MG tablet Take 1 tablet by mouth every 6 (six) hours as needed.   meloxicam (MOBIC) 7.5 MG tablet Take by mouth.   metoprolol succinate (TOPROL-XL) 50 MG 24 hr tablet Take 1 tablet (50 mg total) by mouth every evening. Take with or immediately following a meal.   omeprazole (PRILOSEC) 40 MG capsule TAKE 1 CAPSULE (40 MG TOTAL) BY MOUTH DAILY.   pravastatin (PRAVACHOL) 40 MG tablet TAKE 1 TABLET BY MOUTH EVERY DAY   sildenafil (REVATIO) 20 MG tablet TAKE 1-2 TABLET BY MOUTH EVERY DAILY   spironolactone (ALDACTONE) 25 MG tablet TAKE 1 TABLET BY MOUTH EVERY DAY   valsartan-hydrochlorothiazide (DIOVAN-HCT) 160-25 MG tablet TAKE 1 TABLET BY MOUTH DAILY. TAKE IN PLACE OF ATACAND-HCTZ   Vitamin E 268 MG (400 UNIT) CAPS Take by mouth.   No facility-administered medications prior to visit.    Review of Systems  Constitutional:  Negative for appetite change, chills and fever.  HENT:  Positive for congestion.   Respiratory:  Negative for chest tightness, shortness of breath and wheezing.   Cardiovascular:  Negative for chest pain and palpitations.  Gastrointestinal:  Negative for abdominal pain, nausea and vomiting.      Objective     BP (!) 118/90 (BP Location: Right Arm, Patient Position: Sitting, Cuff Size: Large)   Pulse 66   Resp 16   Ht '5\' 5"'$  (1.651 m)   Wt 191 lb (86.6 kg)   SpO2 97%   BMI 31.78 kg/m     Physical Exam   General Appearance:    Obese male. Alert, cooperative, in no acute distress, appears stated age  Head:    Normocephalic, without obvious abnormality, atraumatic  Eyes:    PERRL, conjunctiva/corneas clear, EOM's intact, fundi     benign, both eyes       Ears:    Normal TM's and external ear canals, both ears  Nose:   Nares normal, septum midline, mucosa normal, no drainage   or sinus tenderness  Throat:   Lips, mucosa, and tongue normal; teeth and gums normal  Neck:   Supple, symmetrical, trachea midline, no adenopathy;       thyroid:  No enlargement/tenderness/nodules; no carotid   bruit or JVD  Back:     Symmetric, no curvature, ROM normal, no CVA tenderness  Lungs:     Clear to auscultation bilaterally, respirations unlabored  Chest wall:    No tenderness or deformity  Heart:    Normal heart rate. Normal rhythm. No murmurs, rubs, or gallops.  S1 and S2 normal  Abdomen:     Soft, non-tender, bowel sounds active all four quadrants,    no masses, no organomegaly  Genitalia:    deferred  Rectal:    deferred  Extremities:   All extremities are intact. No cyanosis or edema  Pulses:   2+ and symmetric all extremities  Skin:   Skin color, texture, turgor normal, no rashes or lesions  Lymph nodes:   Cervical, supraclavicular, and axillary nodes normal  Neurologic:   CNII-XII intact. Normal strength, sensation and reflexes      throughout     Last depression screening scores    10/06/2021   10:46 AM 02/12/2021   11:06 AM 01/23/2021   12:57 PM  PHQ 2/9 Scores  PHQ - 2 Score 0 0 0  PHQ- 9 Score 0 1 1   Last fall risk screening    10/06/2021   10:45 AM  Fall Risk  Falls in the past year? 0  Number falls in past yr: 0  Injury with Fall? 0  Risk for fall due to : No Fall Risks  Follow up Falls evaluation completed   Last Audit-C alcohol use screening    10/06/2021   10:46 AM  Alcohol Use Disorder Test (AUDIT)  1. How often do you have a drink containing alcohol? 3  2. How many drinks containing alcohol do you have on a typical day when you are drinking? 0  3. How often do you have six or more drinks on one occasion? 0  AUDIT-C Score 3   A score of 3 or more in women, and 4 or more in men indicates  increased risk for alcohol abuse, EXCEPT if all of the points are from question 1   No results found for any visits on 10/06/21.  Assessment & Plan    Routine Health Maintenance and Physical Exam  Exercise Activities and Dietary recommendations  Goals      DIET - EAT MORE FRUITS AND VEGETABLES     Quit Smoking     Recommend to continue efforts to reduce smoking habits until no longer smoking.          Immunization History  Administered Date(s) Administered   Fluad Quad(high Dose 65+) 11/27/2019, 09/16/2020   Influenza Split 10/08/2011   Influenza, High Dose Seasonal PF 09/16/2017   Influenza,inj,Quad PF,6+ Mos 10/09/2013, 09/14/2016   Influenza-Unspecified 10/29/2014, 09/16/2017, 10/01/2018   Moderna Sars-Covid-2 Vaccination 11/27/2019   Pneumococcal Conjugate-13 11/15/2017   Pneumococcal Polysaccharide-23 10/08/2011, 05/18/2019   Tdap 10/08/2011   Zoster Recombinat (Shingrix) 02/07/2017, 04/21/2017   Zoster, Live 10/09/2013    Health Maintenance  Topic Date Due   COVID-19 Vaccine (2 - Moderna risk series) 12/25/2019   COLONOSCOPY (Pts 45-55yr Insurance coverage will need to be confirmed)  06/17/2021   TETANUS/TDAP  10/07/2021   Pneumonia Vaccine 71 Years old  Completed   INFLUENZA VACCINE  Completed   Hepatitis C Screening  Completed   Zoster Vaccines- Shingrix  Completed   HPV VACCINES  Aged Out    Discussed health benefits of physical activity, and encouraged him to engage in regular exercise appropriate for his age and condition.   2. Prostate cancer screening  - PSA Total (Reflex To Free) ([facility] only)  3. Essential (primary) hypertension Well controlled. Continue current medications.    4. Obstructive apnea   5. Hyperlipidemia, unspecified hyperlipidemia type He is tolerating pravastatin well with no adverse effects.   - Lipid panel - TSH  6. Fatty infiltration of liver  - CBC - Comprehensive metabolic panel  7. Insomnia, unspecified  type refill - clonazePAM (KLONOPIN) 0.5 MG tablet; Take 1 tablet (0.5 mg total) by mouth every 8 (eight) hours as needed. for anxiety  Dispense: 90 tablet; Refill: 3     The entirety of the information documented in the History of Present Illness, Review of Systems and Physical Exam were personally obtained by me. Portions of this information were initially documented by the CMA and reviewed by me for thoroughness and accuracy.     DLelon Huh MD  [phone](phone) [phone](fax)  [place]

## 2021-10-07 NOTE — Addendum Note (Signed)
Addended by: Birdie Sons on: 10/07/2021 10:33 AM   Modules accepted: Level of Service

## 2021-10-08 DIAGNOSIS — K760 Fatty (change of) liver, not elsewhere classified: Secondary | ICD-10-CM | POA: Diagnosis not present

## 2021-10-08 DIAGNOSIS — E785 Hyperlipidemia, unspecified: Secondary | ICD-10-CM | POA: Diagnosis not present

## 2021-10-08 DIAGNOSIS — Z125 Encounter for screening for malignant neoplasm of prostate: Secondary | ICD-10-CM | POA: Diagnosis not present

## 2021-10-09 LAB — COMPREHENSIVE METABOLIC PANEL
ALT: 41 IU/L (ref 0–44)
AST: 26 IU/L (ref 0–40)
Albumin/Globulin Ratio: 1.7 (ref 1.2–2.2)
Albumin: 4.5 g/dL (ref 3.9–4.9)
Alkaline Phosphatase: 66 IU/L (ref 44–121)
BUN/Creatinine Ratio: 22 (ref 10–24)
BUN: 22 mg/dL (ref 8–27)
Bilirubin Total: 0.9 mg/dL (ref 0.0–1.2)
CO2: 24 mmol/L (ref 20–29)
Calcium: 10 mg/dL (ref 8.6–10.2)
Chloride: 100 mmol/L (ref 96–106)
Creatinine, Ser: 0.98 mg/dL (ref 0.76–1.27)
Globulin, Total: 2.7 g/dL (ref 1.5–4.5)
Glucose: 122 mg/dL — ABNORMAL HIGH (ref 70–99)
Potassium: 4.4 mmol/L (ref 3.5–5.2)
Sodium: 140 mmol/L (ref 134–144)
Total Protein: 7.2 g/dL (ref 6.0–8.5)
eGFR: 83 mL/min/{1.73_m2} (ref >59)

## 2021-10-09 LAB — LIPID PANEL
Chol/HDL Ratio: 3.2 ratio (ref 0.0–5.0)
Cholesterol, Total: 164 mg/dL (ref 100–199)
HDL: 51 mg/dL (ref >39)
LDL Chol Calc (NIH): 91 mg/dL (ref 0–99)
Triglycerides: 122 mg/dL (ref 0–149)
VLDL Cholesterol Cal: 22 mg/dL (ref 5–40)

## 2021-10-09 LAB — TSH: TSH: 0.479 u[IU]/mL (ref 0.450–4.500)

## 2021-10-09 LAB — CBC
Hematocrit: 45.7 % (ref 37.5–51.0)
Hemoglobin: 16 g/dL (ref 13.0–17.7)
MCH: 33.4 pg — ABNORMAL HIGH (ref 26.6–33.0)
MCHC: 35 g/dL (ref 31.5–35.7)
MCV: 95 fL (ref 79–97)
Platelets: 198 10*3/uL (ref 150–450)
RBC: 4.79 x10E6/uL (ref 4.14–5.80)
RDW: 12.4 % (ref 11.6–15.4)
WBC: 8.2 10*3/uL (ref 3.4–10.8)

## 2021-10-09 LAB — PSA TOTAL (REFLEX TO FREE): Prostate Specific Ag, Serum: 1 ng/mL (ref 0.0–4.0)

## 2021-10-13 ENCOUNTER — Other Ambulatory Visit: Payer: Self-pay | Admitting: Family Medicine

## 2021-10-13 ENCOUNTER — Telehealth: Payer: Self-pay

## 2021-10-13 DIAGNOSIS — I10 Essential (primary) hypertension: Secondary | ICD-10-CM

## 2021-10-13 NOTE — Telephone Encounter (Signed)
Pt called for lab results. Shared provider's note. No questions at this time.  Good morning,    Your test results are back and should be available to view in MyChart.  Your cholesterol is good at 164. Your PSA, blood sugar, kidney functions, thyroid, and electrolytes are all normal. Please contact my office if you have any questions or concerns.    Have a great day.   Lelon Huh, MD

## 2021-10-15 ENCOUNTER — Other Ambulatory Visit: Payer: Self-pay | Admitting: Family Medicine

## 2021-10-15 DIAGNOSIS — G8929 Other chronic pain: Secondary | ICD-10-CM

## 2021-10-15 NOTE — Telephone Encounter (Signed)
Requested medication (s) are due for refill today: no  Requested medication (s) are on the active medication list: yes  Last refill:  10/15/21  Future visit scheduled: yes  Notes to clinic:  Unable to refill per protocol, last refill by provider 10/15/21. Duplicate request. Unable to refuse, routing for review.     Requested Prescriptions  Pending Prescriptions Disp Refills   HYDROcodone-acetaminophen (NORCO) 10-325 MG tablet 60 tablet 0    Sig: Take 1 tablet by mouth every 6 (six) hours as needed.     Not Delegated - Analgesics:  Opioid Agonist Combinations Failed - 10/15/2021 11:36 AM      Failed - This refill cannot be delegated      Failed - Urine Drug Screen completed in last 360 days      Passed - Valid encounter within last 3 months    Recent Outpatient Visits           1 week ago Hyperlipidemia, unspecified hyperlipidemia type   [facility] Birdie Sons, MD   8 months ago Chest discomfort   [facility] Birdie Sons, MD   8 months ago Essential (primary) hypertension   [facility] Birdie Sons, MD   10 months ago Chronic right-sided low back pain with right-sided sciatica   [facility] [place], Dionne Bucy, MD   1 year ago Acute right-sided low back pain with right-sided sciatica   [facility] Birdie Sons, MD

## 2021-10-15 NOTE — Telephone Encounter (Signed)
Medication Refill - Medication: HYDROcodone-acetaminophen (NORCO) 10-325 MG tablet  Has the patient contacted their pharmacy? Yes.   (Agent: If no, request that the patient contact the pharmacy for the refill. If patient does not wish to contact the pharmacy document the reason why and proceed with request.) (Agent: If yes, when and what did the pharmacy advise?) call dr  Preferred Pharmacy (with phone number or street name):  [id number] [address] Phone: [phone]: [phone]  Has the patient been seen for an appointment in the last year OR does the patient have an upcoming appointment? Yes.     Agent: Please be advised that RX refills may take up to 3 business days. We ask that you follow-up with your pharmacy.

## 2021-10-16 ENCOUNTER — Other Ambulatory Visit: Payer: Self-pay | Admitting: Family Medicine

## 2021-10-16 DIAGNOSIS — I10 Essential (primary) hypertension: Secondary | ICD-10-CM

## 2021-10-16 MED ORDER — METOPROLOL SUCCINATE ER 50 MG PO TB24: 50.0000 mg | ORAL_TABLET | Freq: Every evening | ORAL | 1 refills | Status: DC

## 2021-10-16 NOTE — Telephone Encounter (Signed)
Copied from [place] [id number]. Topic: General - Other >> Oct 16, 2021  2:29 PM Everette C wrote: Reason for CRM: Medication Refill - Medication: metoprolol succinate (TOPROL-XL) 50 MG 24 hr tablet [[id number]]   Has the patient contacted their pharmacy? Yes.  The patient has been directed to contact their PCP  (Agent: If no, request that the patient contact the pharmacy for the refill. If patient does not wish to contact the pharmacy document the reason why and proceed with request.) (Agent: If yes, when and what did the pharmacy advise?)  Preferred Pharmacy (with phone number or street name): [id number] [place] [place] [place]Phone: [phone]: [phone]: Not open 24 hours   Has the patient been seen for an appointment in the last year OR does the patient have an upcoming appointment? Yes.    Agent: Please be advised that RX refills may take up to 3 business days. We ask that you follow-up with your pharmacy.

## 2021-10-16 NOTE — Telephone Encounter (Signed)
Requested Prescriptions  Pending Prescriptions Disp Refills  . metoprolol succinate (TOPROL-XL) 50 MG 24 hr tablet 30 tablet 0    Sig: Take 1 tablet (50 mg total) by mouth every evening. Take with or immediately following a meal.     Cardiovascular:  Beta Blockers Passed - 10/16/2021  2:41 PM      Passed - Last BP in normal range    BP Readings from Last 1 Encounters:  10/06/21 120/88         Passed - Last Heart Rate in normal range    Pulse Readings from Last 1 Encounters:  10/06/21 66         Passed - Valid encounter within last 6 months    Recent Outpatient Visits          1 week ago Hyperlipidemia, unspecified hyperlipidemia type   [facility] Birdie Sons, MD   8 months ago Chest discomfort   [facility] Birdie Sons, MD   8 months ago Essential (primary) hypertension   [facility] Birdie Sons, MD   10 months ago Chronic right-sided low back pain with right-sided sciatica   [facility] [place], Dionne Bucy, MD   1 year ago Acute right-sided low back pain with right-sided sciatica   [facility] Birdie Sons, MD

## 2021-10-17 MED ORDER — HYDROCODONE-ACETAMINOPHEN 10-325 MG PO TABS: 1.0000 | ORAL_TABLET | Freq: Four times a day (QID) | ORAL | 0 refills | Status: DC | PRN

## 2021-10-20 ENCOUNTER — Telehealth: Payer: Self-pay | Admitting: Family Medicine

## 2021-10-20 DIAGNOSIS — G8929 Other chronic pain: Secondary | ICD-10-CM

## 2021-10-20 MED ORDER — HYDROCODONE-ACETAMINOPHEN 10-325 MG PO TABS: 1.0000 | ORAL_TABLET | Freq: Four times a day (QID) | ORAL | 0 refills | Status: DC | PRN

## 2021-10-20 NOTE — Telephone Encounter (Signed)
[facility] [place] [facility] HYDR[facility]one-acetaminophen ([place]) 10-325 MG tablet in stock / No [facility] in [facility] at the moment / pt is completely out and how found out that [place] does have them in stock / pt needs refill to be re called in to this pharmacy today

## 2021-10-22 ENCOUNTER — Other Ambulatory Visit: Payer: Self-pay | Admitting: Family Medicine

## 2021-10-22 DIAGNOSIS — N529 Male erectile dysfunction, unspecified: Secondary | ICD-10-CM

## 2021-10-23 ENCOUNTER — Other Ambulatory Visit: Payer: Self-pay | Admitting: Family Medicine

## 2021-10-23 DIAGNOSIS — G8929 Other chronic pain: Secondary | ICD-10-CM

## 2021-10-23 NOTE — Telephone Encounter (Signed)
Requested medication (s) are due for refill today -no  Requested medication (s) are on the active medication list -yes  Future visit scheduled -no  Last refill: 10/20/21 #60  Notes to clinic: non delegated Rx  Requested Prescriptions  Pending Prescriptions Disp Refills   HYDROcodone-acetaminophen ([place]) 10-325 MG tablet [Pharmacy Med Name: HYDROCODONE-APAP 10-325 MG TAB] 60 tablet     Sig: TAKE (1) TABLET BY MOUTH EVERY 6 HOURS AS NEEDED     Not Delegated - Analgesics:  Opioid Agonist Combinations Failed - 10/23/2021  7:49 AM      Failed - This refill cannot be delegated      Failed - Urine Drug Screen completed in last 360 days      Passed - Valid encounter within last 3 months    Recent Outpatient Visits           2 weeks ago Hyperlipidemia, unspecified hyperlipidemia type   [facility] Birdie Sons, MD   8 months ago Chest discomfort   [facility] Birdie Sons, MD   9 months ago Essential (primary) hypertension   [facility], Kirstie Peri, MD   10 months ago Chronic right-sided low back pain with right-sided sciatica   [facility] [place] [facility], Dionne Bucy, MD   1 year ago Acute right-sided low back pain with right-sided sciatica   [facility] Birdie Sons, MD                 Requested Prescriptions  Pending Prescriptions Disp Refills   HYDROcodone-acetaminophen ([place]) 10-325 MG tablet [Pharmacy Med Name: HYDROCODONE-APAP 10-325 MG TAB] 60 tablet     Sig: TAKE (1) TABLET BY MOUTH EVERY 6 HOURS AS NEEDED     Not Delegated - Analgesics:  Opioid Agonist Combinations Failed - 10/23/2021  7:49 AM      Failed - This refill cannot be delegated      Failed - Urine Drug Screen completed in last 360 days      Passed - Valid encounter within last 3 months    Recent Outpatient Visits           2 weeks ago Hyperlipidemia, unspecified hyperlipidemia type   [facility] Birdie Sons, MD   8 months ago Chest discomfort   [facility] Birdie Sons, MD   9 months ago Essential (primary) hypertension   [facility] Birdie Sons, MD   10 months ago Chronic right-sided low back pain with right-sided sciatica   [facility] [place], Dionne Bucy, MD   1 year ago Acute right-sided low back pain with right-sided sciatica   [facility] Birdie Sons, MD

## 2021-10-26 ENCOUNTER — Ambulatory Visit: Payer: Self-pay

## 2021-10-26 NOTE — Telephone Encounter (Signed)
I can't find any information anywhere about citalopram ES. Is not listed in our medication database.

## 2021-10-26 NOTE — Telephone Encounter (Signed)
Message from Oneta Rack sent at 10/26/2021  1:36 PM EDT  Summary: Medication Management   Patient states he heard of a new medication called citalopram ES and states it has less side effects then the one he is on now. Patient would like to know PCP thoughts.         Side effects forgetfulness, new one gives better memory less side effects. Memory is good but with lose track. To [facility] please.    Chief Complaint: would like to change to citalopram ES Symptoms: stated occasionally loses train of thought Frequency: n/a Pertinent Negatives: Patient denies any other sx Disposition: '[]'$ ED /'[]'$ Urgent Care (no appt availability in office) / '[]'$ Appointment(In office/virtual)/ '[]'$  [facility]/ '[]'$ Home Care/ '[x]'$ Refused Recommended Disposition /'[]'$ [facility]/ '[]'$  Follow-up with PCP Additional Notes: Pt wants to have med called to [facility].    Reason for Disposition  Prescription request for new medicine (not a refill)  Answer Assessment - Initial Assessment Questions 1. NAME of MEDICINE: "What medicine(s) are you calling about?"     Citalopram ES 2. QUESTION: "What is your question?" (e.g., double dose of medicine, side effect)     Pt stated he would like to be prescribed this medication 3. PRESCRIBER: "Who prescribed the medicine?" Reason: if prescribed by specialist, call should be referred to that group.     PCP 4. SYMPTOMS: "Do you have any symptoms?" If Yes, ask: "What symptoms are you having?"  "How bad are the symptoms (e.g., mild, moderate, severe)     Stated occasionally lose train of thought. Stated family member had same issue and sx went away with Citalopram ES 5. PREGNANCY:  "Is there any chance that you are pregnant?" "When was your last menstrual period?"     N/a  Protocols used: Medication Question Call-A-AH

## 2021-10-27 NOTE — Telephone Encounter (Signed)
Summary: Medication Management   Pt is talking about Escitalopram///Lexapro  some one told him it was better for anxiety   CB@  [phone]   ----- Message from Oneta Rack sent at 10/26/2021  1:36 PM EDT -----  Patient states he heard of a new medication called citalopram ES and states it has less side effects then the one he is on now. Patient would like to know PCP thoughts.     Call to patient- he would like to discuss new medication with provider- virtual appointment scheduled for patient.

## 2021-10-27 NOTE — Telephone Encounter (Signed)
Patient advised.

## 2021-10-30 ENCOUNTER — Telehealth (INDEPENDENT_AMBULATORY_CARE_PROVIDER_SITE_OTHER): Payer: Medicare Other | Admitting: Family Medicine

## 2021-10-30 DIAGNOSIS — F419 Anxiety disorder, unspecified: Secondary | ICD-10-CM

## 2021-10-30 DIAGNOSIS — F339 Major depressive disorder, recurrent, unspecified: Secondary | ICD-10-CM | POA: Diagnosis not present

## 2021-10-30 MED ORDER — ESCITALOPRAM OXALATE 20 MG PO TABS: 20.0000 mg | ORAL_TABLET | Freq: Every day | ORAL | 2 refills | Status: DC

## 2021-10-30 NOTE — Progress Notes (Signed)
Virtual telephone visit    Virtual Visit via Telephone Note   This format is felt to be most appropriate for this patient at this time. The patient did not have access to video technology or had technical difficulties with video requiring transitioning to audio format only (telephone). Physical exam was limited to content and character of the telephone converstion.    Patient location: home Provider location: [facility]  I discussed the limitations of evaluation and management by telemedicine and the availability of in person appointments. The patient expressed understanding and agreed to proceed.   Visit Date: 10/30/2021  Today's healthcare provider: Lelon Huh, MD   Chief Complaint  Patient presents with   Anxiety   Subjective    HPI  Anxiety, Follow-up  Patient would like to discuss changing citalopram to citalopram ES. He was told that this formulation would be more effective with controlling anxiety.   He reports good compliance with treatment. He reports good tolerance of treatment. He is not having side effects.   He feels his anxiety is moderate and Unchanged since last visit.  Symptoms: No chest pain No difficulty concentrating  No dizziness No fatigue  No feelings of losing control No insomnia  No irritable No palpitations  No panic attacks No racing thoughts  No shortness of breath No sweating  No tremors/shakes    GAD-7 Results    10/30/2021   11:39 AM  GAD-7 Generalized Anxiety Disorder Screening Tool  1. Feeling Nervous, Anxious, or on Edge 2  2. Not Being Able to Stop or Control Worrying 3  3. Worrying Too Much About Different Things 3  4. Trouble Relaxing 2  5. Being So Restless it's Hard To Sit Still 0  6. Becoming Easily Annoyed or Irritable 0  7. Feeling Afraid As If Something Awful Might Happen 0  Total GAD-7 Score 10  Difficulty At Work, Home, or Getting  Along With Others? Somewhat difficult    PHQ-9 Scores    10/30/2021   11:37 AM  10/06/2021   10:46 AM 02/12/2021   11:06 AM  PHQ9 SCORE ONLY  PHQ-9 Total Score 0 0 1    ---------------------------------------------------------------------------------------------------     Medications: Outpatient Medications Prior to Visit  Medication Sig   amLODipine (NORVASC) 10 MG tablet TAKE 1 TABLET BY MOUTH EVERY DAY   aspirin 325 MG tablet Take 325 mg by mouth daily.    cetirizine (ZYRTEC) 10 MG tablet TAKE 1 TABLET (10 MG TOTAL) BY MOUTH DAILY. FOR ALLERGIES   Cholecalciferol (VITAMIN D3 PO) Take by mouth daily.   citalopram (CELEXA) 40 MG tablet TAKE 1 TABLET BY MOUTH EVERY DAY   clonazePAM (KLONOPIN) 0.5 MG tablet Take 1 tablet (0.5 mg total) by mouth every 8 (eight) hours as needed. for anxiety   Cyanocobalamin (VITAMIN B-12 PO) Take by mouth daily.   cyclobenzaprine (FLEXERIL) 5 MG tablet Take 1 tablet (5 mg total) by mouth 3 (three) times daily as needed for muscle spasms.   fluticasone (FLONASE) 50 MCG/ACT nasal spray SPRAY 2 SPRAYS INTO EACH NOSTRIL EVERY DAY   HYDROcodone-acetaminophen (NORCO) 10-325 MG tablet Take 1 tablet by mouth every 6 (six) hours as needed.   meloxicam (MOBIC) 7.5 MG tablet Take by mouth.   metoprolol succinate (TOPROL-XL) 50 MG 24 hr tablet Take 1 tablet (50 mg total) by mouth every evening. Take with or immediately following a meal.   omeprazole (PRILOSEC) 40 MG capsule TAKE 1 CAPSULE (40 MG TOTAL) BY MOUTH DAILY.  pravastatin (PRAVACHOL) 40 MG tablet TAKE 1 TABLET BY MOUTH EVERY DAY   sildenafil (REVATIO) 20 MG tablet TAKE 1 TO 2 TABLETS BY MOUTH DAILY   spironolactone (ALDACTONE) 25 MG tablet TAKE 1 TABLET BY MOUTH EVERY DAY   valsartan-hydrochlorothiazide (DIOVAN-HCT) 160-25 MG tablet TAKE 1 TABLET BY MOUTH DAILY. TAKE IN PLACE OF ATACAND-HCTZ   Vitamin E 268 MG (400 UNIT) CAPS Take by mouth.   No facility-administered medications prior to visit.    Review of Systems  Constitutional:  Negative for appetite change, chills and fever.   Respiratory:  Negative for chest tightness, shortness of breath and wheezing.   Cardiovascular:  Negative for chest pain and palpitations.  Gastrointestinal:  Negative for abdominal pain, nausea and vomiting.       Objective    There were no vitals taken for this visit.   Awake, alert, oriented x 3. In no apparent distress    Assessment & Plan     1. Depression, recurrent ([place])   2. Anxiety disorder, unspecified type He would like to change '40mg'$  citalopram to escitalopram since current medication doesn't seem to be working as well as he would like.   - escitalopram (LEXAPRO) 20 MG tablet; Take 1 tablet (20 mg total) by mouth daily.  Dispense: 30 tablet; Refill: 2      I discussed the assessment and treatment plan with the patient. The patient was provided an opportunity to ask questions and all were answered. The patient agreed with the plan and demonstrated an understanding of the instructions.   The patient was advised to call back or seek an in-person evaluation if the symptoms worsen or if the condition fails to improve as anticipated.  I provided 8 minutes of non-face-to-face time during this encounter.  The entirety of the information documented in the History of Present Illness, Review of Systems and Physical Exam were personally obtained by me. Portions of this information were initially documented by the CMA and reviewed by me for thoroughness and accuracy.    Lelon Huh, MD [facility] [phone] (phone) [phone] (fax)  [place]

## 2021-11-03 ENCOUNTER — Other Ambulatory Visit: Payer: Self-pay | Admitting: Family Medicine

## 2021-11-03 DIAGNOSIS — G8929 Other chronic pain: Secondary | ICD-10-CM

## 2021-11-03 NOTE — Telephone Encounter (Signed)
Medication Refill - Medication: HYDROcodone-acetaminophen (NORCO) 10-325 MG tablet  Has the patient contacted their pharmacy? No. (Agent: If no, request that the patient contact the pharmacy for the refill. If patient does not wish to contact the pharmacy document the reason why and proceed with request.)   Preferred Pharmacy (with phone number or street name):  [place], [place] - [address]  [place] [address]  Phone: [phone] Fax: [phone]  Hours: Not open 24 hours   Has the patient been seen for an appointment in the last year OR does the patient have an upcoming appointment? Yes.    Agent: Please be advised that RX refills may take up to 3 business days. We ask that you follow-up with your pharmacy.

## 2021-11-04 NOTE — Telephone Encounter (Signed)
Requested medication (s) are due for refill today: yes  Requested medication (s) are on the active medication list: yes  Last refill:  10/20/21 #60  Future visit scheduled: no  Notes to clinic:  med not delegated to NT to reorder   Requested Prescriptions  Pending Prescriptions Disp Refills   HYDROcodone-acetaminophen (NORCO) 10-325 MG tablet 60 tablet 0    Sig: Take 1 tablet by mouth every 6 (six) hours as needed.     Not Delegated - Analgesics:  Opioid Agonist Combinations Failed - 11/03/2021 11:34 AM      Failed - This refill cannot be delegated      Failed - Urine Drug Screen completed in last 360 days      Passed - Valid encounter within last 3 months    Recent Outpatient Visits           5 days ago    [facility] Birdie Sons, MD   4 weeks ago Hyperlipidemia, unspecified hyperlipidemia type   [facility] Birdie Sons, MD   8 months ago Chest discomfort   [facility] Birdie Sons, MD   9 months ago Essential (primary) hypertension   [facility] Birdie Sons, MD   11 months ago Chronic right-sided low back pain with right-sided sciatica   [facility], Dionne Bucy, MD

## 2021-11-05 MED ORDER — HYDROCODONE-ACETAMINOPHEN 10-325 MG PO TABS: 1.0000 | ORAL_TABLET | Freq: Four times a day (QID) | ORAL | 0 refills | Status: DC | PRN

## 2021-11-09 NOTE — Patient Instructions (Signed)
.   Please review the attached list of medications and notify my office if there are any errors.   . Please bring all of your medications to every appointment so we can make sure that our medication list is the same as yours.   

## 2021-11-17 ENCOUNTER — Other Ambulatory Visit: Payer: Self-pay | Admitting: Family Medicine

## 2021-11-17 DIAGNOSIS — G8929 Other chronic pain: Secondary | ICD-10-CM

## 2021-11-17 NOTE — Telephone Encounter (Signed)
Medication Refill - Medication: HYDROcodone-acetaminophen (NORCO) 10-325 MG tablet   Has the patient contacted their pharmacy? No. He said he calls in every month for this.  Preferred Pharmacy (with phone number or street name): [place], [place]  Has the patient been seen for an appointment in the last year OR does the patient have an upcoming appointment? Yes.    Agent: Please be advised that RX refills may take up to 3 business days. We ask that you follow-up with your pharmacy.

## 2021-11-17 NOTE — Telephone Encounter (Signed)
Requested medication (s) are due for refill today - provider review   Requested medication (s) are on the active medication list -yes  Future visit scheduled -no  Last refill: 11/05/21 #60  Notes to clinic: non delegated Rx  Requested Prescriptions  Pending Prescriptions Disp Refills   HYDROcodone-acetaminophen (NORCO) 10-325 MG tablet 60 tablet 0    Sig: Take 1 tablet by mouth every 6 (six) hours as needed.     Not Delegated - Analgesics:  Opioid Agonist Combinations Failed - 11/17/2021  9:17 AM      Failed - This refill cannot be delegated      Failed - Urine Drug Screen completed in last 360 days      Passed - Valid encounter within last 3 months    Recent Outpatient Visits           2 weeks ago Depression, recurrent [facility])   [facility] Birdie Sons, MD   1 month ago Hyperlipidemia, unspecified hyperlipidemia type   [facility] Birdie Sons, MD   9 months ago Chest discomfort   [facility] Birdie Sons, MD   9 months ago Essential (primary) hypertension   [facility] Birdie Sons, MD   11 months ago Chronic right-sided low back pain with right-sided sciatica   [facility], Dionne Bucy, MD                 Requested Prescriptions  Pending Prescriptions Disp Refills   HYDROcodone-acetaminophen (NORCO) 10-325 MG tablet 60 tablet 0    Sig: Take 1 tablet by mouth every 6 (six) hours as needed.     Not Delegated - Analgesics:  Opioid Agonist Combinations Failed - 11/17/2021  9:17 AM      Failed - This refill cannot be delegated      Failed - Urine Drug Screen completed in last 360 days      Passed - Valid encounter within last 3 months    Recent Outpatient Visits           2 weeks ago Depression, recurrent [facility])   [facility] Birdie Sons, MD   1 month ago Hyperlipidemia, unspecified hyperlipidemia type   [facility] Birdie Sons, MD   9 months ago Chest discomfort   [facility] Birdie Sons, MD   9 months ago Essential (primary) hypertension   [facility] Birdie Sons, MD   11 months ago Chronic right-sided low back pain with right-sided sciatica   [facility], Dionne Bucy, MD

## 2021-11-18 NOTE — Telephone Encounter (Signed)
Patient called stating somehow [facility] called him and had his pain meds and he doesn't need it called into [facility] any longer today. He states [facility] a lot of times doesn't [facility] it and that is why he generally prefers [facility] for his pain meds. He just wanted his provider to know

## 2021-11-20 MED ORDER — HYDROCODONE-ACETAMINOPHEN 10-325 MG PO TABS: 1.0000 | ORAL_TABLET | Freq: Four times a day (QID) | ORAL | 0 refills | Status: DC | PRN

## 2021-11-23 ENCOUNTER — Other Ambulatory Visit: Payer: Self-pay | Admitting: Family Medicine

## 2021-11-23 DIAGNOSIS — F419 Anxiety disorder, unspecified: Secondary | ICD-10-CM

## 2021-12-10 ENCOUNTER — Other Ambulatory Visit: Payer: Self-pay | Admitting: Family Medicine

## 2021-12-10 DIAGNOSIS — G8929 Other chronic pain: Secondary | ICD-10-CM

## 2021-12-10 MED ORDER — HYDROCODONE-ACETAMINOPHEN 10-325 MG PO TABS: 1.0000 | ORAL_TABLET | Freq: Four times a day (QID) | ORAL | 0 refills | Status: DC | PRN

## 2021-12-10 NOTE — Telephone Encounter (Signed)
Medication Refill - Medication: HYDROcodone-acetaminophen (NORCO) 10-325 MG tablet   Has the patient contacted their pharmacy? Yes.   (Agent: If no, request that the patient contact the pharmacy for the refill. If patient does not wish to contact the pharmacy document the reason why and proceed with request.) (Agent: If yes, when and what did the pharmacy advise?)  Preferred Pharmacy (with phone number or street name):  [facility] [place], [place] [address] AT [address]  [place] [address]  Phone: [phone] Fax: [phone]   Has the patient been seen for an appointment in the last year OR does the patient have an upcoming appointment? Yes.    Agent: Please be advised that RX refills may take up to 3 business days. We ask that you follow-up with your pharmacy.

## 2021-12-10 NOTE — Telephone Encounter (Signed)
Requested medication (s) are due for refill today:Yes  Requested medication (s) are on the active medication list: Yes  Last refill:  11/20/21  Future visit scheduled: Yes  Notes to clinic:  Unable to refill per protocol, cannot delegate.      Requested Prescriptions  Pending Prescriptions Disp Refills   HYDROcodone-acetaminophen (NORCO) 10-325 MG tablet 60 tablet 0    Sig: Take 1 tablet by mouth every 6 (six) hours as needed.     Not Delegated - Analgesics:  Opioid Agonist Combinations Failed - 12/10/2021  3:22 PM      Failed - This refill cannot be delegated      Failed - Urine Drug Screen completed in last 360 days      Passed - Valid encounter within last 3 months    Recent Outpatient Visits           1 month ago Depression, recurrent [facility])   [facility] Birdie Sons, MD   2 months ago Hyperlipidemia, unspecified hyperlipidemia type   [facility] Birdie Sons, MD   9 months ago Chest discomfort   [facility] Birdie Sons, MD   10 months ago Essential (primary) hypertension   [facility] Birdie Sons, MD   1 year ago Chronic right-sided low back pain with right-sided sciatica   [facility], Dionne Bucy, MD

## 2021-12-25 ENCOUNTER — Other Ambulatory Visit: Payer: Self-pay | Admitting: Family Medicine

## 2021-12-25 ENCOUNTER — Telehealth: Payer: Self-pay | Admitting: Family Medicine

## 2021-12-25 DIAGNOSIS — G8929 Other chronic pain: Secondary | ICD-10-CM

## 2021-12-25 NOTE — Telephone Encounter (Signed)
The patient called in wanting his provider to know he stopped taking the escitalopram (LEXAPRO) 20 MG tablet because he said it wasn't working too well for him and he went back on his old prescription of citalopram (CELEXA) 40 MG tablet. Please assist further if any questions or problems.

## 2021-12-25 NOTE — Telephone Encounter (Signed)
Requested medication (s) are due for refill today: yes  Requested medication (s) are on the active medication list: yes  Last refill:  12/10/21 #60 0 refills  Future visit scheduled: no   Notes to clinic:  not delegated per protocol. Do you want to refill Rx? Patient request     Requested Prescriptions  Pending Prescriptions Disp Refills   HYDROcodone-acetaminophen (NORCO) 10-325 MG tablet 60 tablet 0    Sig: Take 1 tablet by mouth every 6 (six) hours as needed.     Not Delegated - Analgesics:  Opioid Agonist Combinations Failed - 12/25/2021  2:32 PM      Failed - This refill cannot be delegated      Failed - Urine Drug Screen completed in last 360 days      Passed - Valid encounter within last 3 months    Recent Outpatient Visits           1 month ago Depression, recurrent [facility])   [facility] Birdie Sons, MD   2 months ago Hyperlipidemia, unspecified hyperlipidemia type   [facility] Birdie Sons, MD   10 months ago Chest discomfort   [facility] Birdie Sons, MD   11 months ago Essential (primary) hypertension   [facility] Birdie Sons, MD   1 year ago Chronic right-sided low back pain with right-sided sciatica   [facility], Dionne Bucy, MD

## 2021-12-25 NOTE — Addendum Note (Signed)
Addended by: Birdie Sons on: 12/25/2021 03:38 PM   Modules accepted: Orders

## 2021-12-25 NOTE — Telephone Encounter (Signed)
Medication Refill - Medication: HYDROcodone-acetaminophen (NORCO) 10-325 MG tablet   Has the patient contacted their pharmacy? No.   Preferred Pharmacy (with phone number or street name):  [place], [place] Phone: [phone]  Fax: [phone]     Has the patient been seen for an appointment in the last year OR does the patient have an upcoming appointment? Yes.    Please assist patient further

## 2021-12-26 ENCOUNTER — Emergency Department
Admission: EM | Admit: 2021-12-26 | Discharge: 2021-12-26 | Disposition: A | Discharge status: Home / Self Care | Payer: Medicare Other | Attending: Emergency Medicine | Admitting: Emergency Medicine

## 2021-12-26 ENCOUNTER — Emergency Department: Payer: Medicare Other

## 2021-12-26 DIAGNOSIS — Z7982 Long term (current) use of aspirin: Secondary | ICD-10-CM | POA: Insufficient documentation

## 2021-12-26 DIAGNOSIS — I10 Essential (primary) hypertension: Secondary | ICD-10-CM | POA: Diagnosis not present

## 2021-12-26 DIAGNOSIS — R000 Tachycardia, unspecified: Secondary | ICD-10-CM | POA: Diagnosis not present

## 2021-12-26 DIAGNOSIS — R1084 Generalized abdominal pain: Secondary | ICD-10-CM | POA: Diagnosis not present

## 2021-12-26 DIAGNOSIS — R110 Nausea: Secondary | ICD-10-CM | POA: Diagnosis not present

## 2021-12-26 DIAGNOSIS — R1111 Vomiting without nausea: Secondary | ICD-10-CM | POA: Diagnosis not present

## 2021-12-26 DIAGNOSIS — R1110 Vomiting, unspecified: Secondary | ICD-10-CM | POA: Diagnosis not present

## 2021-12-26 DIAGNOSIS — Z1152 Encounter for screening for COVID-19: Secondary | ICD-10-CM | POA: Insufficient documentation

## 2021-12-26 DIAGNOSIS — R109 Unspecified abdominal pain: Secondary | ICD-10-CM | POA: Diagnosis not present

## 2021-12-26 DIAGNOSIS — R197 Diarrhea, unspecified: Secondary | ICD-10-CM | POA: Diagnosis not present

## 2021-12-26 DIAGNOSIS — R112 Nausea with vomiting, unspecified: Secondary | ICD-10-CM | POA: Insufficient documentation

## 2021-12-26 DIAGNOSIS — Z79899 Other long term (current) drug therapy: Secondary | ICD-10-CM | POA: Diagnosis not present

## 2021-12-26 DIAGNOSIS — I700 Atherosclerosis of aorta: Secondary | ICD-10-CM | POA: Diagnosis not present

## 2021-12-26 LAB — CBC
HCT: 48.4 % (ref 39.0–52.0)
Hemoglobin: 16.8 g/dL (ref 13.0–17.0)
MCH: 32.1 pg (ref 26.0–34.0)
MCHC: 34.7 g/dL (ref 30.0–36.0)
MCV: 92.4 fL (ref 80.0–100.0)
Platelets: 203 10*3/uL (ref 150–400)
RBC: 5.24 MIL/uL (ref 4.22–5.81)
RDW: 11.7 % (ref 11.5–15.5)
WBC: 14.5 10*3/uL — ABNORMAL HIGH (ref 4.0–10.5)
nRBC: 0 % (ref 0.0–0.2)

## 2021-12-26 LAB — COMPREHENSIVE METABOLIC PANEL
ALT: 30 U/L (ref 0–44)
AST: 27 U/L (ref 15–41)
Albumin: 4.5 g/dL (ref 3.5–5.0)
Alkaline Phosphatase: 53 U/L (ref 38–126)
Anion gap: 12 (ref 5–15)
BUN: 27 mg/dL — ABNORMAL HIGH (ref 8–23)
CO2: 20 mmol/L — ABNORMAL LOW (ref 22–32)
Calcium: 9.6 mg/dL (ref 8.9–10.3)
Chloride: 105 mmol/L (ref 98–111)
Creatinine, Ser: 0.87 mg/dL (ref 0.61–1.24)
GFR, Estimated: >60 mL/min (ref >60)
Glucose, Bld: 186 mg/dL — ABNORMAL HIGH (ref 70–99)
Potassium: 4 mmol/L (ref 3.5–5.1)
Sodium: 137 mmol/L (ref 135–145)
Total Bilirubin: 1.8 mg/dL — ABNORMAL HIGH (ref 0.3–1.2)
Total Protein: 8 g/dL (ref 6.5–8.1)

## 2021-12-26 LAB — RESP PANEL BY RT-PCR (RSV, FLU A&B, COVID)  RVPGX2
Influenza A by PCR: NEGATIVE
Influenza B by PCR: NEGATIVE
Resp Syncytial Virus by PCR: NEGATIVE
SARS Coronavirus 2 by RT PCR: NEGATIVE

## 2021-12-26 LAB — ETHANOL: Alcohol, Ethyl (B): <10 mg/dL (ref <10)

## 2021-12-26 LAB — LIPASE, BLOOD: Lipase: 30 U/L (ref 11–51)

## 2021-12-26 MED ORDER — FAMOTIDINE IN NACL 20-0.9 MG/50ML-% IV SOLN
20.0000 mg | Freq: Once | INTRAVENOUS | Status: AC
  Administered 2021-12-26: 20 mg via INTRAVENOUS
  Filled 2021-12-26: qty 50

## 2021-12-26 MED ORDER — IOHEXOL 300 MG/ML  SOLN
100.0000 mL | Freq: Once | INTRAMUSCULAR | Status: AC | PRN
  Administered 2021-12-26: 100 mL via INTRAVENOUS

## 2021-12-26 MED ORDER — ONDANSETRON HCL 4 MG/2ML IJ SOLN
INTRAMUSCULAR | Status: AC
  Filled 2021-12-26: qty 2

## 2021-12-26 MED ORDER — MORPHINE SULFATE (PF) 4 MG/ML IV SOLN
4.0000 mg | Freq: Once | INTRAVENOUS | Status: AC
  Administered 2021-12-26: 4 mg via INTRAVENOUS
  Filled 2021-12-26: qty 1

## 2021-12-26 MED ORDER — ONDANSETRON HCL 4 MG/2ML IJ SOLN
4.0000 mg | Freq: Once | INTRAMUSCULAR | Status: AC | PRN
  Administered 2021-12-26: 4 mg via INTRAVENOUS

## 2021-12-26 MED ORDER — SODIUM CHLORIDE 0.9 % IV BOLUS
1000.0000 mL | Freq: Once | INTRAVENOUS | Status: AC
  Administered 2021-12-26: 1000 mL via INTRAVENOUS

## 2021-12-26 MED ORDER — ALUM & MAG HYDROXIDE-SIMETH 200-200-20 MG/5ML PO SUSP
30.0000 mL | Freq: Once | ORAL | Status: AC
  Administered 2021-12-26: 30 mL via ORAL
  Filled 2021-12-26: qty 30

## 2021-12-26 MED ORDER — ONDANSETRON HCL 4 MG/2ML IJ SOLN
4.0000 mg | Freq: Once | INTRAMUSCULAR | Status: AC
  Administered 2021-12-26: 4 mg via INTRAVENOUS
  Filled 2021-12-26: qty 2

## 2021-12-26 MED ORDER — ONDANSETRON 4 MG PO TBDP: 4.0000 mg | ORAL_TABLET | Freq: Three times a day (TID) | ORAL | 0 refills | Status: DC | PRN

## 2021-12-26 NOTE — ED Provider Notes (Signed)
[facility] Provider Note    Event Date/Time   First MD Initiated Contact with Patient 12/26/21 [phone]     (approximate)   History   Abdominal Pain   HPI  Gary Carter is a 71 y.o. male  brought to the ED via EMS from home with a chief complaint of abdominal pain, nausea, vomiting and diarrhea him last night.  Thinks he may have food poisoning from barbecue pork butter around 1 PM yesterday.  Endorses some alcohol yesterday afternoon.  Denies fever, chest pain or shortness of breath.      Past Medical History   Past Medical History:  Diagnosis Date   Anxiety    Barrett's esophagus    GERD (gastroesophageal reflux disease)    History of chicken pox 06/12/2014   DID have Chicken Pox. DID have Measles. DID have Mumps. DID have Rubella.     History of dysphagia    History of fatty infiltration of liver    History of measles    History of mumps    History of rubella    Hyperlipidemia    Hypertension    Sleep apnea    SOB (shortness of breath)      Active Problem List   Patient Active Problem List   Diagnosis Date Noted   Strabismus 02/12/2021   Acute right-sided low back pain with right-sided sciatica 11/28/2020   Status post eye surgery 05/09/2020   Gastritis 07/20/2018   Fatty infiltration of liver 07/03/2018   Gastroesophageal reflux disease with hiatal hernia 07/03/2018   Esophageal dysphagia 07/03/2018   Chronic, continuous use of opioids 11/15/2017   History of kidney stones 05/04/2016   Bunion 06/12/2014   Insomnia 06/12/2014   Panic attack 06/12/2014   Obstructive apnea 06/12/2014   Anxiety disorder 06/11/2014   H/O suicide attempt 05/28/2010   Esophagitis, reflux 05/15/2008   Actinic keratoses 05/06/2008   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   Allergic rhinitis 03/08/2007   Arthralgia of hand 12/14/2006   HLD (hyperlipidemia) 08/15/2006   Family history of coronary arteriosclerosis 08/13/2006   ED (erectile dysfunction) of  organic origin 04/28/2006   Chronic low back pain 12/07/2005   Depression, recurrent ([place]) 12/05/2005   Barrett's esophagus without dysplasia 02/06/1997   Essential (primary) hypertension 02/06/1997   Current smoker 01/11/1958     Past Surgical History   Past Surgical History:  Procedure Laterality Date   ABDOMINAL SURGERY     BACK SURGERY  12-04-2003   Lumbar fusion & C5-C6 fusion   ESOPHAGOGASTRODUODENOSCOPY (EGD) WITH PROPOFOL N/A 07/10/2018   Procedure: ESOPHAGOGASTRODUODENOSCOPY (EGD) WITH PROPOFOL;  Surgeon: Manya Silvas, MD;  Location: [facility] ENDOSCOPY;  Service: Endoscopy;  Laterality: N/A;   HERNIA REPAIR     Myocardial perfusion scan  10/10/2006   NECK SURGERY     NERVE, TENDON AND ARTERY REPAIR Left 10/18/2012   Procedure: NERVE, TENDON AND ARTERY REPAIR;  Surgeon: Linna Hoff, MD;  Location: [place];  Service: Orthopedics;  Laterality: Left;   NISSEN FUNDOPLICATION  1761   UPPER GI ENDOSCOPY  04/09/2007   Dr. Tiffany Kocher; Mucosal changes suspicious for Barretts esophagits   WOUND EXPLORATION Left 10/18/2012   Procedure: WOUND EXPLORATION;  Surgeon: Linna Hoff, MD;  Location: [place];  Service: Orthopedics;  Laterality: Left;     Home Medications   Prior to Admission medications   Medication Sig Start Date End Date Taking? Authorizing Provider  amLODipine (NORVASC) 10 MG tablet TAKE 1 TABLET BY MOUTH EVERY  DAY 09/02/21   Birdie Sons, MD  aspirin 325 MG tablet Take 325 mg by mouth daily.  05/13/08   [provider]  cetirizine (ZYRTEC) 10 MG tablet TAKE 1 TABLET (10 MG TOTAL) BY MOUTH DAILY. FOR ALLERGIES 09/21/21   Birdie Sons, MD  Cholecalciferol (VITAMIN D3 PO) Take by mouth daily.    [provider]  citalopram (CELEXA) 40 MG tablet TAKE 1 TABLET BY MOUTH EVERY DAY 09/02/21   Birdie Sons, MD  clonazePAM (KLONOPIN) 0.5 MG tablet Take 1 tablet (0.5 mg total) by mouth every 8 (eight) hours as needed. for anxiety 10/06/21   Birdie Sons,  MD  Cyanocobalamin (VITAMIN B-12 PO) Take by mouth daily.    [provider]  cyclobenzaprine (FLEXERIL) 5 MG tablet Take 1 tablet (5 mg total) by mouth 3 (three) times daily as needed for muscle spasms. 02/24/21   Birdie Sons, MD  fluticasone [facility]) 50 MCG/ACT nasal spray SPRAY 2 SPRAYS INTO EACH NOSTRIL EVERY DAY 09/17/21   Birdie Sons, MD  HYDROcodone-acetaminophen (NORCO) 10-325 MG tablet Take 1 tablet by mouth every 6 (six) hours as needed. 12/10/21   Birdie Sons, MD  meloxicam [facility]) 7.5 MG tablet Take by mouth. 02/10/21 02/10/22  [provider]  metoprolol succinate (TOPROL-XL) 50 MG 24 hr tablet Take 1 tablet (50 mg total) by mouth every evening. Take with or immediately following a meal. 10/16/21 01/09/23  Birdie Sons, MD  omeprazole (PRILOSEC) 40 MG capsule TAKE 1 CAPSULE (40 MG TOTAL) BY MOUTH DAILY. 09/02/21   Birdie Sons, MD  pravastatin (PRAVACHOL) 40 MG tablet TAKE 1 TABLET BY MOUTH EVERY DAY 09/02/21   Birdie Sons, MD  sildenafil (REVATIO) 20 MG tablet TAKE 1 TO 2 TABLETS BY MOUTH DAILY 10/22/21   Birdie Sons, MD  spironolactone (ALDACTONE) 25 MG tablet TAKE 1 TABLET BY MOUTH EVERY DAY 09/02/21   Birdie Sons, MD  valsartan-hydrochlorothiazide (DIOVAN-HCT) 160-25 MG tablet TAKE 1 TABLET BY MOUTH DAILY. TAKE IN PLACE OF ATACAND-HCTZ 09/02/21   Birdie Sons, MD  Vitamin E 268 MG (400 UNIT) CAPS Take by mouth.    [provider]  POTASSIUM PO Take daily by mouth.  10/03/19  [provider]     Allergies  Hctz [hydrochlorothiazide]   Family History   Family History  Problem Relation Age of Onset   Hypertension Sister    Bipolar disorder Sister    Heart attack Father 29   Emphysema Mother    Heart disease Mother    Diabetes Daughter 3   Bipolar disorder Brother    Hypertension Brother    Obesity Brother      Physical Exam  Triage Vital Signs: ED Triage Vitals  Enc Vitals Group     BP 12/26/21  0504 (!) 136/112     Pulse Rate 12/26/21 0504 (!) 115     Resp 12/26/21 0504 (!) 22     Temp 12/26/21 0504 98.7 F (37.1 C)     Temp Source 12/26/21 0504 Oral     SpO2 12/26/21 0504 95 %     Weight 12/26/21 0505 187 lb (84.8 kg)     Height 12/26/21 0505 '5\' 9"'$  (1.753 m)     Head Circumference --      Peak Flow --      Pain Score 12/26/21 0505 10     Pain Loc --      Pain Edu? --  Excl. in [place]? --     Updated Vital Signs: BP 136/89   Pulse 90   Temp 98.7 F (37.1 C) (Oral)   Resp (!) 22   Ht '5\' 9"'$  (1.753 m)   Wt 84.8 kg   SpO2 (!) 87%   BMI 27.62 kg/m    General: Awake, mild distress.  CV:  Tachycardic.  Good peripheral perfusion.  Resp:  Increased effort.  CTAB. Abd:  Moderately tender to palpation diffusely without rebound or guarding.  No distention.  Other:  No truncal vesicles.   ED Results / Procedures / Treatments  Labs (all labs ordered are listed, but only abnormal results are displayed) Labs Reviewed  COMPREHENSIVE METABOLIC PANEL - Abnormal; Notable for the following components:      Result Value   CO2 20 (*)    Glucose, Bld 186 (*)    BUN 27 (*)    Total Bilirubin 1.8 (*)    All other components within normal limits  CBC - Abnormal; Notable for the following components:   WBC 14.5 (*)    All other components within normal limits  RESP PANEL BY RT-PCR (RSV, FLU A&B, COVID)  RVPGX2  LIPASE, BLOOD  ETHANOL  URINALYSIS, ROUTINE W REFLEX MICROSCOPIC     EKG  ED ECG REPORT I, Andris Brothers J, the attending physician, personally viewed and interpreted this ECG.   Date: 12/26/2021  EKG Time: 0512  Rate: 118  Rhythm: sinus tachycardia  Axis: Normal  Intervals:none  ST&T Change: Nonspecific    RADIOLOGY CT scan pending   Official radiology report(s): No results found.   PROCEDURES:  Critical Care performed: No  .1-3 Lead EKG Interpretation  Performed by: Paulette Blanch, MD Authorized by: Paulette Blanch, MD     Interpretation:  abnormal     ECG rate:  115   ECG rate assessment: tachycardic     Rhythm: sinus tachycardia     Ectopy: none     Conduction: normal   Comments:     Patient placed on cardiac monitor to evaluate for arrhythmias    MEDICATIONS ORDERED IN ED: Medications  iohexol (OMNIPAQUE) 300 MG/ML solution 100 mL (has no administration in time range)  ondansetron (ZOFRAN) injection 4 mg (4 mg Intravenous Given 12/26/21 0511)  sodium chloride 0.9 % bolus 1,000 mL (1,000 mLs Intravenous New Bag/Given 12/26/21 0611)  ondansetron (ZOFRAN) injection 4 mg (4 mg Intravenous Given 12/26/21 0610)  morphine (PF) 4 MG/ML injection 4 mg (4 mg Intravenous Given 12/26/21 0610)     IMPRESSION / MDM / ASSESSMENT AND PLAN / ED COURSE  I reviewed the triage vital signs and the nursing notes.                             71 year old male presenting with abdominal pain, nausea, vomiting and diarrhea. Differential diagnosis includes, but is not limited to, acute appendicitis, renal colic, testicular torsion, urinary tract infection/pyelonephritis, prostatitis,  epididymitis, diverticulitis, small bowel obstruction or ileus, colitis, abdominal aortic aneurysm, gastroenteritis, hernia, etc. I have personally reviewed patient's records and note a PCP video visit on 10/30/2020 for anxiety and depression.  Patient's presentation is most consistent with acute presentation with potential threat to life or bodily function.  The patient is on the cardiac monitor to evaluate for evidence of arrhythmia and/or significant heart rate changes.  Will obtain basic lab work, UA.  Initiate IV fluid hydration, IV antiemetic and IV analgesia.  Will  obtain CT abdomen/pelvis to evaluate for intra-abdominal etiology of patient's pain.  Clinical Course as of 12/26/21 0658  Sat Dec 26, 2021  0642 Laboratory results demonstrate moderate leukocytosis WBC 14.5, BUN 127 with normal creatinine.  Respiratory panel is negative. [JS]  [id number] Care  transferred to Dr. Starleen Blue at change of shift pending CT scan and reassessment. [JS]    Clinical Course User Index [JS] Paulette Blanch, MD     FINAL CLINICAL IMPRESSION(S) / ED DIAGNOSES   Final diagnoses:  Generalized abdominal pain  Nausea vomiting and diarrhea     Rx / DC Orders   ED Discharge Orders     None        Note:  This document was prepared using Dragon voice recognition software and may include unintentional dictation errors.   Paulette Blanch, MD 12/26/21 [phone]

## 2021-12-26 NOTE — ED Triage Notes (Signed)
Pt from home BIB [facility] c/o abd pain with n/v/d since 8 pm last night. Pt think he may have food poising after eating BBQ pork butt yesterday around 1 pm. Hx of GERD and barrett's esophagus. Pt reports some ETOH this afternoon as well after eating.

## 2021-12-26 NOTE — ED Triage Notes (Signed)
First Nurse Note:  BIB [facility] from home. Reports abd pain with n/v/d since 2000 last night. States onset after eating at [employer] at work.   100% RA HR 100 160/90- hx HTN RR 18

## 2021-12-26 NOTE — Discharge Instructions (Signed)
You likely have a stomach bug.  Please rest and stick to clear liquids while you are still feeling sick.  You can use the Zofran for nausea and vomiting.  If your belly pain is worsening you develop fever or having blood in your stool please return to the emergency department.

## 2021-12-26 NOTE — ED Provider Notes (Signed)
Patient signed out to me pending CT and reassessment.  This is a 71 year old male presents with nausea vomiting diarrhea and diffuse abdominal pain which she attributes to eating barbecue yesterday afternoon.  Labs showing leukocytosis to 14 but otherwise reassuring.  CT is read by radiology as nonspecific mucosal thickening throughout the large and small bowel likely due to enteritis.  On reassessment patient is tolerating p.o. does continue to have cramping abdominal pain says it feels like his bowels are knots.  On abdominal exam he does have some mild diffuse tenderness but exam is nonperitoneal.  Patient is requesting additional dose of pain medication.  Given I suspect this is gastroenteritis will avoid any additional doses of IV opiates.  Will give Pepcid and GI cocktail and see if this improves his symptoms.  On reassessment patient is resting comfortably. Says pain has improved. We discussed bowel rest and hydration.  Discussed return precautions for worsening abdominal pain fever or bloody stool.  At this point with reassuring workup and improvement in pain tolerating p.o. think he is appropriate for discharge.   Rada Hay, MD 12/26/21 [phone]

## 2021-12-27 MED ORDER — HYDROCODONE-ACETAMINOPHEN 10-325 MG PO TABS: 1.0000 | ORAL_TABLET | Freq: Four times a day (QID) | ORAL | 0 refills | Status: DC | PRN

## 2021-12-30 ENCOUNTER — Telehealth: Payer: Self-pay

## 2021-12-30 NOTE — Telephone Encounter (Signed)
     Patient  visit [place] on  12/16   Have you been able to follow up with your primary care physician? No   The patient was or was not able to obtain any needed medicine or equipment. Yes     Are there diet recommendations that you are having difficulty following? Na   Patient expresses understanding of discharge instructions and education provided has no other needs at this time.   Yes     [place], [facility], Care Management  [phone] [address], [place], [place] [address] Phone: [phone] Email: [email or web address]

## 2022-01-06 ENCOUNTER — Ambulatory Visit: Payer: Self-pay

## 2022-01-06 NOTE — Telephone Encounter (Signed)
Chief Complaint: Nausea Symptoms: mild nausea Frequency: since diagnosed with norovirus during hospital stay on 12/26/21 Pertinent Negatives: Patient denies vomiting Disposition: '[]'$ ED /'[]'$ Urgent Care (no appt availability in office) / '[x]'$ Appointment(In office/virtual)/ '[]'$  [place] [facility]/ '[]'$ Home Care/ '[x]'$ Refused Recommended Disposition /'[]'$  Mobile Bus/ '[]'$  Follow-up with PCP Additional Notes: Advised he will need evaluation by PCP, he says he was already evaluated by hospital doctor and had norovirus and this nausea is just lingering. I offered a virtual or OV, he declined says he will buy something OTC at [facility]    Summary: nausea   Patient states he had the nuro virus and he is still has nausea and is wanting [id number] [place]: [phone]: [phone]  Please advise     Reason for Disposition  Nausea lasts > 1 week  Answer Assessment - Initial Assessment Questions 1. NAUSEA SEVERITY: "How bad is the nausea?" (e.g., mild, moderate, severe; dehydration, weight loss)   - MILD: loss of appetite without change in eating habits   - MODERATE: decreased oral intake without significant weight loss, dehydration, or malnutrition   - SEVERE: inadequate caloric or fluid intake, significant weight loss, symptoms of dehydration     Mild 2. ONSET: "When did the nausea begin?"     12/26/21 (went to hospital) 3. VOMITING: "Any vomiting?" If Yes, ask: "How many times today?"     No 4. CAUSE: "What do you think is causing the nausea?"     Norovirus  Protocols used: Nausea-A-AH

## 2022-01-12 NOTE — Progress Notes (Unsigned)
      Established patient visit   Patient: Gary Carter   DOB: 1950-06-19   72 y.o. Male  MRN: [id number] Visit Date: 01/13/2022  Today's healthcare provider: Lelon Huh, MD   No chief complaint on file.  Subjective    Abdominal Pain Associated symptoms include nausea.    ***  Medications: Outpatient Medications Prior to Visit  Medication Sig   amLODipine (NORVASC) 10 MG tablet TAKE 1 TABLET BY MOUTH EVERY DAY   aspirin 325 MG tablet Take 325 mg by mouth daily.    cetirizine (ZYRTEC) 10 MG tablet TAKE 1 TABLET (10 MG TOTAL) BY MOUTH DAILY. FOR ALLERGIES   Cholecalciferol (VITAMIN D3 PO) Take by mouth daily.   citalopram (CELEXA) 40 MG tablet TAKE 1 TABLET BY MOUTH EVERY DAY   clonazePAM (KLONOPIN) 0.5 MG tablet Take 1 tablet (0.5 mg total) by mouth every 8 (eight) hours as needed. for anxiety   Cyanocobalamin (VITAMIN B-12 PO) Take by mouth daily.   cyclobenzaprine (FLEXERIL) 5 MG tablet Take 1 tablet (5 mg total) by mouth 3 (three) times daily as needed for muscle spasms.   fluticasone (FLONASE) 50 MCG/ACT nasal spray SPRAY 2 SPRAYS INTO EACH NOSTRIL EVERY DAY   HYDROcodone-acetaminophen (NORCO) 10-325 MG tablet Take 1 tablet by mouth every 6 (six) hours as needed.   meloxicam (MOBIC) 7.5 MG tablet Take by mouth.   metoprolol succinate (TOPROL-XL) 50 MG 24 hr tablet Take 1 tablet (50 mg total) by mouth every evening. Take with or immediately following a meal.   omeprazole (PRILOSEC) 40 MG capsule TAKE 1 CAPSULE (40 MG TOTAL) BY MOUTH DAILY.   ondansetron (ZOFRAN-ODT) 4 MG disintegrating tablet Take 1 tablet (4 mg total) by mouth every 8 (eight) hours as needed.   pravastatin (PRAVACHOL) 40 MG tablet TAKE 1 TABLET BY MOUTH EVERY DAY   sildenafil (REVATIO) 20 MG tablet TAKE 1 TO 2 TABLETS BY MOUTH DAILY   spironolactone (ALDACTONE) 25 MG tablet TAKE 1 TABLET BY MOUTH EVERY DAY   valsartan-hydrochlorothiazide (DIOVAN-HCT) 160-25 MG tablet TAKE 1 TABLET BY MOUTH DAILY. TAKE  IN PLACE OF ATACAND-HCTZ   Vitamin E 268 MG (400 UNIT) CAPS Take by mouth.   No facility-administered medications prior to visit.    Review of Systems  Gastrointestinal:  Positive for abdominal pain and nausea.    {Labs  Heme  Chem  Endocrine  Serology  Results Review (optional):23779}   Objective    There were no vitals taken for this visit. {Show previous vital signs (optional):23777}  Physical Exam  ***  No results found for any visits on 01/13/22.  Assessment & Plan     ***  No follow-ups on file.      {provider attestation***:1}   Lelon Huh, MD  [facility] [phone] (phone) [phone] (fax)  [place]

## 2022-01-13 ENCOUNTER — Encounter: Payer: Self-pay | Admitting: Family Medicine

## 2022-01-13 ENCOUNTER — Ambulatory Visit (INDEPENDENT_AMBULATORY_CARE_PROVIDER_SITE_OTHER): Payer: Medicare Other | Admitting: Family Medicine

## 2022-01-13 VITALS — BP 108/76 | HR 66 | Temp 97.8°F | Resp 16 | Ht 69.0 in | Wt 191.7 lb

## 2022-01-13 DIAGNOSIS — G8929 Other chronic pain: Secondary | ICD-10-CM | POA: Diagnosis not present

## 2022-01-13 DIAGNOSIS — M5441 Lumbago with sciatica, right side: Secondary | ICD-10-CM

## 2022-01-13 DIAGNOSIS — I10 Essential (primary) hypertension: Secondary | ICD-10-CM

## 2022-01-13 DIAGNOSIS — K529 Noninfective gastroenteritis and colitis, unspecified: Secondary | ICD-10-CM

## 2022-01-13 MED ORDER — METOPROLOL SUCCINATE ER 50 MG PO TB24: 50.0000 mg | ORAL_TABLET | Freq: Every evening | ORAL | 3 refills | Status: DC

## 2022-01-13 MED ORDER — ONDANSETRON 4 MG PO TBDP: 4.0000 mg | ORAL_TABLET | Freq: Three times a day (TID) | ORAL | 0 refills | Status: DC | PRN

## 2022-01-13 MED ORDER — HYDROCODONE-ACETAMINOPHEN 10-325 MG PO TABS: 1.0000 | ORAL_TABLET | Freq: Four times a day (QID) | ORAL | 0 refills | Status: DC | PRN

## 2022-01-13 NOTE — Patient Instructions (Signed)
.   Please review the attached list of medications and notify my office if there are any errors.   . Please bring all of your medications to every appointment so we can make sure that our medication list is the same as yours.   

## 2022-01-22 NOTE — Progress Notes (Signed)
I did not place thias order.

## 2022-01-26 ENCOUNTER — Other Ambulatory Visit: Payer: Self-pay | Admitting: Family Medicine

## 2022-01-26 DIAGNOSIS — G8929 Other chronic pain: Secondary | ICD-10-CM

## 2022-01-26 NOTE — Progress Notes (Signed)
Established patient visit   Patient: Gary Carter   DOB: 03-20-1950   72 y.o. Male  MRN: [id number] Visit Date: 01/27/2022  Today's healthcare provider: Mardene Speak, PA-C   Chief Complaint  Patient presents with   Abdominal Pain   Subjective    Abdominal Pain This is a new problem. Episode onset: 1 month. Episode frequency: comes and goes. The problem has been gradually worsening. The pain is located in the generalized abdominal region. The pain is at a severity of 5/10. The pain is mild. Quality: stabbing. The abdominal pain does not radiate. Associated symptoms include nausea. The pain is aggravated by eating. Relieved by: liquid diet. Treatments tried: GERD medication, zofran. The treatment provided mild relief. Enteritis  Pt is currently on a regular diet ------------------------------------------------------------------------  Endorses having regular bowel movements. Denies having vomiting, diarrhea  Was seen at ED on 12/26/21 for nausea, vomiting, diarrhea and diffuse abdominal pain which  he attributed to eating barbecue. Was diagnosed with enteritis. After discussion return precautions for worsening abdominal pian, fever or bloody stool amd reassuring workup , pt was d/c  Medications: Outpatient Medications Prior to Visit  Medication Sig   amLODipine (NORVASC) 10 MG tablet TAKE 1 TABLET BY MOUTH EVERY DAY   aspirin 325 MG tablet Take 325 mg by mouth daily.    cetirizine (ZYRTEC) 10 MG tablet TAKE 1 TABLET (10 MG TOTAL) BY MOUTH DAILY. FOR ALLERGIES   Cholecalciferol (VITAMIN D3 PO) Take by mouth daily.   citalopram (CELEXA) 40 MG tablet TAKE 1 TABLET BY MOUTH EVERY DAY   clonazePAM (KLONOPIN) 0.5 MG tablet Take 1 tablet (0.5 mg total) by mouth every 8 (eight) hours as needed. for anxiety   Cyanocobalamin (VITAMIN B-12 PO) Take by mouth daily.   cyclobenzaprine (FLEXERIL) 5 MG tablet Take 1 tablet (5 mg total) by mouth 3 (three) times daily as needed for muscle  spasms.   fluticasone (FLONASE) 50 MCG/ACT nasal spray SPRAY 2 SPRAYS INTO EACH NOSTRIL EVERY DAY   HYDROcodone-acetaminophen (NORCO) 10-325 MG tablet Take 1 tablet by mouth every 6 (six) hours as needed.   meloxicam (MOBIC) 7.5 MG tablet Take by mouth.   metoprolol succinate (TOPROL-XL) 50 MG 24 hr tablet Take 1 tablet (50 mg total) by mouth every evening. Take with or immediately following a meal.   omeprazole (PRILOSEC) 40 MG capsule TAKE 1 CAPSULE (40 MG TOTAL) BY MOUTH DAILY.   ondansetron (ZOFRAN-ODT) 4 MG disintegrating tablet Take 1 tablet (4 mg total) by mouth every 8 (eight) hours as needed for up to 20 days.   pravastatin (PRAVACHOL) 40 MG tablet TAKE 1 TABLET BY MOUTH EVERY DAY   sildenafil (REVATIO) 20 MG tablet TAKE 1 TO 2 TABLETS BY MOUTH DAILY   spironolactone (ALDACTONE) 25 MG tablet TAKE 1 TABLET BY MOUTH EVERY DAY   valsartan-hydrochlorothiazide (DIOVAN-HCT) 160-25 MG tablet TAKE 1 TABLET BY MOUTH DAILY. TAKE IN PLACE OF ATACAND-HCTZ   Vitamin E 268 MG (400 UNIT) CAPS Take by mouth.   No facility-administered medications prior to visit.    Review of Systems  Gastrointestinal:  Positive for abdominal pain and nausea.   Except see hpi    Objective    BP 115/84   Pulse 66   Ht '5\' 9"'$  (1.753 m)   Wt 193 lb (87.5 kg)   SpO2 96%   BMI 28.50 kg/m    Physical Exam Vitals reviewed.  Constitutional:      General: He is not in  acute distress.    Appearance: Normal appearance. He is not diaphoretic.  HENT:     Head: Normocephalic and atraumatic.  Eyes:     General: No scleral icterus.    Conjunctiva/sclera: Conjunctivae normal.     Pupils: Pupils are equal, round, and reactive to light.  Cardiovascular:     Rate and Rhythm: Normal rate and regular rhythm.     Pulses: Normal pulses.     Heart sounds: Normal heart sounds. No murmur heard. Pulmonary:     Effort: Pulmonary effort is normal. No respiratory distress.     Breath sounds: Normal breath sounds. No  wheezing or rhonchi.  Abdominal:     General: Bowel sounds are normal. There is distension.     Tenderness: There is generalized abdominal tenderness.  Musculoskeletal:     Cervical back: Neck supple.     Right lower leg: No edema.     Left lower leg: No edema.  Lymphadenopathy:     Cervical: No cervical adenopathy.  Skin:    General: Skin is warm and dry.     Findings: No rash.  Neurological:     Mental Status: He is alert and oriented to person, place, and time. Mental status is at baseline.  Psychiatric:        Behavior: Behavior normal.      No results found for any visits on 01/27/22.  Assessment & Plan    Generalized abdominal pain Could be due to Enteritis Chronic X 1 mo CT from 12/26/21 showed " nonspecific changes with areas of mucosal thickening of the small and large bowel suggestive of enteritis. No abscess or obstruction - Comprehensive metabolic panel - CBC with Differential/Platelet - ondansetron (ZOFRAN-ODT) 4 MG disintegrating tablet; Take 1 tablet (4 mg total) by mouth every 8 (eight) hours as needed for up to 20 days.  Dispense: 20 tablet; Refill: 0 Advised to proceed with bland diet Instruction for [employer] were provided to pt Pt was advised to drink plenty of water. FU in 2 weeks if symptoms persist  The patient was advised to call back or seek an in-person evaluation if the symptoms worsen or if the condition fails to improve as anticipated.  I discussed the assessment and treatment plan with the patient. The patient was provided an opportunity to ask questions and all were answered. The patient agreed with the plan and demonstrated an understanding of the instructions.  The entirety of the information documented in the History of Present Illness, Review of Systems and Physical Exam were personally obtained by me. Portions of this information were initially documented by the CMA and reviewed by me for thoroughness and accuracy.  Mardene Speak, [facility],  [place] [phone] (phone) [phone] (fax)

## 2022-01-26 NOTE — Telephone Encounter (Signed)
Requested medication (s) are due for refill today - provider review   Requested medication (s) are on the active medication list -yes  Future visit scheduled -yes  Last refill: 01/13/22 #60  Notes to clinic: non delegated Rx  Requested Prescriptions  Pending Prescriptions Disp Refills   HYDROcodone-acetaminophen (NORCO) 10-325 MG tablet 60 tablet 0    Sig: Take 1 tablet by mouth every 6 (six) hours as needed.     Not Delegated - Analgesics:  Opioid Agonist Combinations Failed - 01/26/2022  1:51 PM      Failed - This refill cannot be delegated      Failed - Urine Drug Screen completed in last 360 days      Passed - Valid encounter within last 3 months    Recent Outpatient Visits           1 week ago [place], Donald E, MD   2 months ago Depression, recurrent [facility])   [facility] Birdie Sons, MD   3 months ago Hyperlipidemia, unspecified hyperlipidemia type   [facility] Birdie Sons, MD   11 months ago Chest discomfort   [facility] Birdie Sons, MD   1 year ago Essential (primary) hypertension   [name], Kirstie Peri, MD       Future Appointments             Tomorrow Mardene Speak, PA-C [facility]   In 3 months [place], Kirstie Peri, MD [facility]               Requested Prescriptions  Pending Prescriptions Disp Refills   HYDROcodone-acetaminophen (NORCO) 10-325 MG tablet 60 tablet 0    Sig: Take 1 tablet by mouth every 6 (six) hours as needed.     Not Delegated - Analgesics:  Opioid Agonist Combinations Failed - 01/26/2022  1:51 PM      Failed - This refill cannot be delegated      Failed - Urine Drug Screen completed in last 360 days      Passed - Valid encounter within last 3 months    Recent Outpatient Visits           1 week ago [place], Donald E, MD   2  months ago Depression, recurrent [facility])   [facility] Birdie Sons, MD   3 months ago Hyperlipidemia, unspecified hyperlipidemia type   [facility] Birdie Sons, MD   11 months ago Chest discomfort   [facility] Birdie Sons, MD   1 year ago Essential (primary) hypertension   [facility], Kirstie Peri, MD       Future Appointments             Tomorrow Mardene Speak, PA-C [facility], [place]   In 3 months Fisher, Kirstie Peri, MD [facility], [place]

## 2022-01-26 NOTE — Telephone Encounter (Signed)
Medication Refill - Medication: HYDROcodone-acetaminophen (NORCO) 10-325 MG tablet   Has the patient contacted their pharmacy? No.  Preferred Pharmacy (with phone number or street name):  [id number] [place] [place]: [phone] Fax: [phone]    Has the patient been seen for an appointment in the last year OR does the patient have an upcoming appointment? Yes.     Please assist patient further

## 2022-01-27 ENCOUNTER — Encounter: Payer: Self-pay | Admitting: Physician Assistant

## 2022-01-27 ENCOUNTER — Ambulatory Visit (INDEPENDENT_AMBULATORY_CARE_PROVIDER_SITE_OTHER): Payer: Medicare Other | Admitting: Physician Assistant

## 2022-01-27 VITALS — BP 115/84 | HR 66 | Ht 69.0 in | Wt 193.0 lb

## 2022-01-27 DIAGNOSIS — K529 Noninfective gastroenteritis and colitis, unspecified: Secondary | ICD-10-CM

## 2022-01-27 DIAGNOSIS — R1084 Generalized abdominal pain: Secondary | ICD-10-CM

## 2022-01-27 MED ORDER — ONDANSETRON 4 MG PO TBDP: 4.0000 mg | ORAL_TABLET | Freq: Three times a day (TID) | ORAL | 0 refills | Status: AC | PRN

## 2022-01-28 ENCOUNTER — Telehealth: Payer: Self-pay | Admitting: *Deleted

## 2022-01-28 LAB — CBC WITH DIFFERENTIAL/PLATELET
Basophils Absolute: 0 10*3/uL (ref 0.0–0.2)
Basos: 1 %
EOS (ABSOLUTE): 0.2 10*3/uL (ref 0.0–0.4)
Eos: 2 %
Hematocrit: 45.3 % (ref 37.5–51.0)
Hemoglobin: 15.5 g/dL (ref 13.0–17.7)
Immature Grans (Abs): 0 10*3/uL (ref 0.0–0.1)
Immature Granulocytes: 1 %
Lymphocytes Absolute: 2.1 10*3/uL (ref 0.7–3.1)
Lymphs: 24 %
MCH: 32.4 pg (ref 26.6–33.0)
MCHC: 34.2 g/dL (ref 31.5–35.7)
MCV: 95 fL (ref 79–97)
Monocytes Absolute: 0.9 10*3/uL (ref 0.1–0.9)
Monocytes: 10 %
Neutrophils Absolute: 5.4 10*3/uL (ref 1.4–7.0)
Neutrophils: 62 %
Platelets: 175 10*3/uL (ref 150–450)
RBC: 4.78 x10E6/uL (ref 4.14–5.80)
RDW: 11.8 % (ref 11.6–15.4)
WBC: 8.6 10*3/uL (ref 3.4–10.8)

## 2022-01-28 LAB — COMPREHENSIVE METABOLIC PANEL
ALT: 25 IU/L (ref 0–44)
AST: 22 IU/L (ref 0–40)
Albumin/Globulin Ratio: 1.7 (ref 1.2–2.2)
Albumin: 4.4 g/dL (ref 3.9–4.9)
Alkaline Phosphatase: 67 IU/L (ref 44–121)
BUN/Creatinine Ratio: 18 (ref 10–24)
BUN: 17 mg/dL (ref 8–27)
Bilirubin Total: 0.6 mg/dL (ref 0.0–1.2)
CO2: 25 mmol/L (ref 20–29)
Calcium: 9.8 mg/dL (ref 8.6–10.2)
Chloride: 101 mmol/L (ref 96–106)
Creatinine, Ser: 0.97 mg/dL (ref 0.76–1.27)
Globulin, Total: 2.6 g/dL (ref 1.5–4.5)
Glucose: 116 mg/dL — ABNORMAL HIGH (ref 70–99)
Potassium: 4.9 mmol/L (ref 3.5–5.2)
Sodium: 140 mmol/L (ref 134–144)
Total Protein: 7 g/dL (ref 6.0–8.5)
eGFR: 85 mL/min/{1.73_m2} (ref >59)

## 2022-01-28 MED ORDER — HYDROCODONE-ACETAMINOPHEN 10-325 MG PO TABS: 1.0000 | ORAL_TABLET | Freq: Four times a day (QID) | ORAL | 0 refills | Status: DC | PRN

## 2022-01-28 NOTE — Progress Notes (Signed)
Please let patient know that his current labs within normal limits or stable for him which is reassuring.  Please continue with symptom management as we discussed.

## 2022-01-28 NOTE — Telephone Encounter (Signed)
Result note read to pt, verbalizes understanding.    Please let patient know that his current labs within normal limits or stable for him which is reassuring.  Please continue with symptom management as we discussed."  "

## 2022-02-10 ENCOUNTER — Other Ambulatory Visit: Payer: Self-pay | Admitting: Family Medicine

## 2022-02-10 DIAGNOSIS — G8929 Other chronic pain: Secondary | ICD-10-CM

## 2022-02-10 NOTE — Telephone Encounter (Signed)
Requested medication (s) are due for refill today:   Provider to review  Requested medication (s) are on the active medication list:   Yes  Future visit scheduled:   Yes   Last ordered: 01/28/2022 #60, 0 refills  Returned because a duplicate request came in for this non delegated refill.   Requested Prescriptions  Pending Prescriptions Disp Refills   HYDROcodone-acetaminophen (NORCO) 10-325 MG tablet 60 tablet 0    Sig: Take 1 tablet by mouth every 6 (six) hours as needed.     Not Delegated - Analgesics:  Opioid Agonist Combinations Failed - 02/10/2022 10:22 AM      Failed - This refill cannot be delegated      Failed - Urine Drug Screen completed in last 360 days      Passed - Valid encounter within last 3 months    Recent Outpatient Visits           2 weeks ago [place], [place], PA-C   4 weeks ago [place], Donald E, MD   3 months ago Depression, recurrent [facility])   [place], Donald E, MD   4 months ago Hyperlipidemia, unspecified hyperlipidemia type   [facility] Birdie Sons, MD   11 months ago Chest discomfort   [place], Donald E, MD       Future Appointments             In 2 months Fisher, Kirstie Peri, MD [facility], [place]

## 2022-02-10 NOTE — Telephone Encounter (Unsigned)
Copied from [place] [id number]. Topic: General - Other >> Feb 10, 2022  9:01 AM Everette C wrote: Reason for CRM: Medication Refill - Medication: HYDROcodone-acetaminophen ([place]) 10-325 MG tablet [[id number]]  Has the patient contacted their pharmacy? No.   (Agent: If no, request that the patient contact the pharmacy for the refill. If patient does not wish to contact the pharmacy document the reason why and proceed with request.) (Agent: If yes, when and what did the pharmacy advise?)  Preferred Pharmacy (with phone number or street name[id number] [place] [id number]: [phone]: [phone]: Not open 24 hours   Has the patient been seen for an appointment in the last year OR does the patient have an upcoming appointment? Yes.    Agent: Please be advised that RX refills may take up to 3 business days. We ask that you follow-up with your pharmacy.

## 2022-02-11 MED ORDER — HYDROCODONE-ACETAMINOPHEN 10-325 MG PO TABS: 1.0000 | ORAL_TABLET | Freq: Four times a day (QID) | ORAL | 0 refills | Status: DC | PRN

## 2022-02-16 IMAGING — CR DG CERVICAL SPINE COMPLETE 4+V
1 series · 8 of 8 positions shown · non-contrast
Comparison: Cervical spine 03/12/2003. CT myelogram cervical spine
02/19/2003.

CLINICAL DATA: Neck and right arm pain for 2 weeks. No recent
injury.

EXAM:
CERVICAL SPINE - COMPLETE 4+ VIEW

[Series 1: dg cervical spine complete · 0.14mm/px · 8 of 8 slices shown]
[im 1/8]
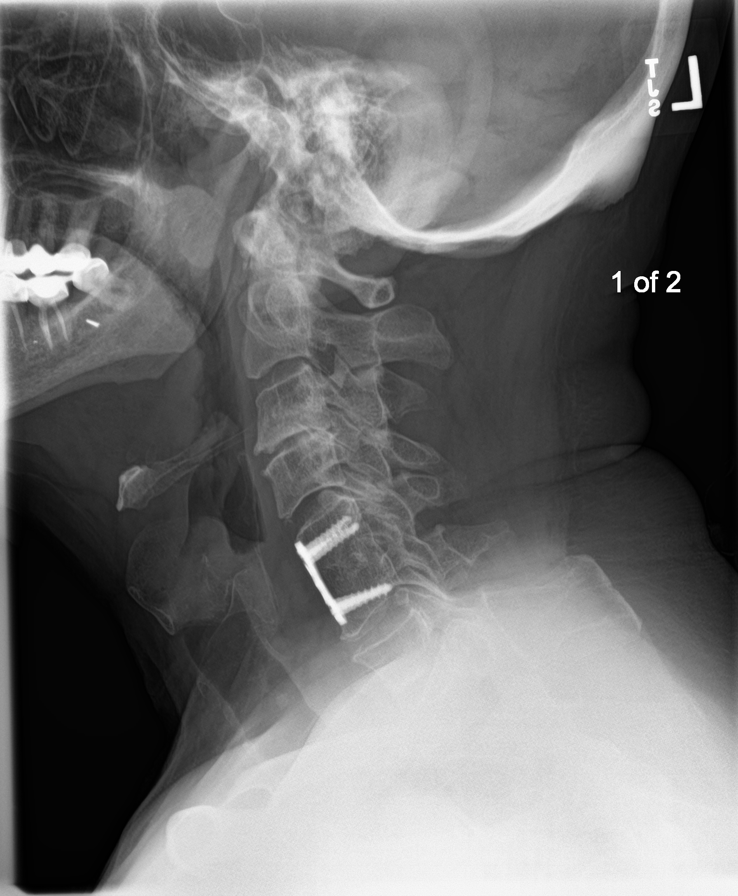
[im 2/8]
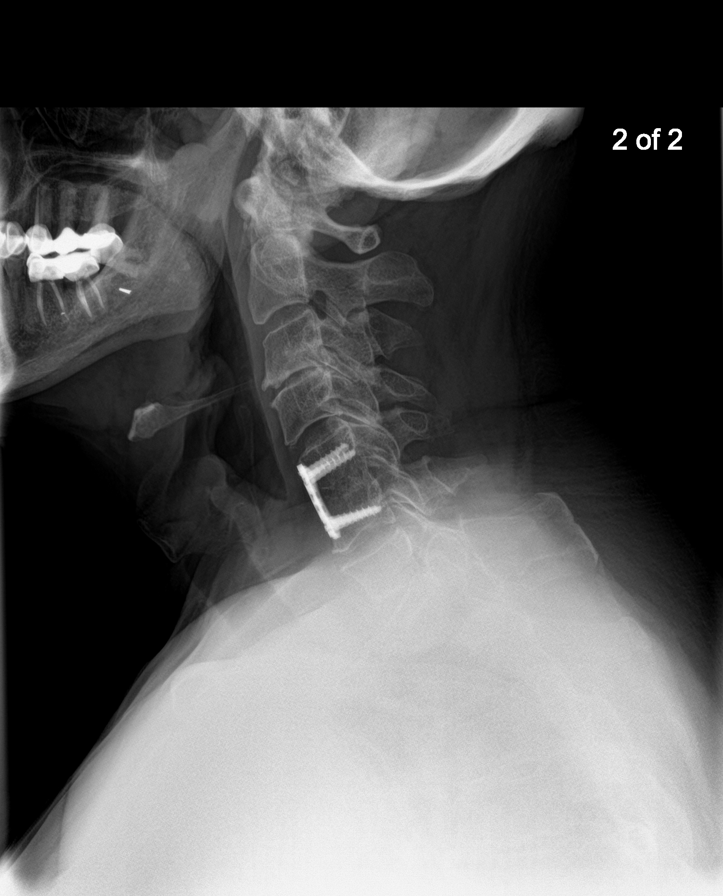
[im 3/8]
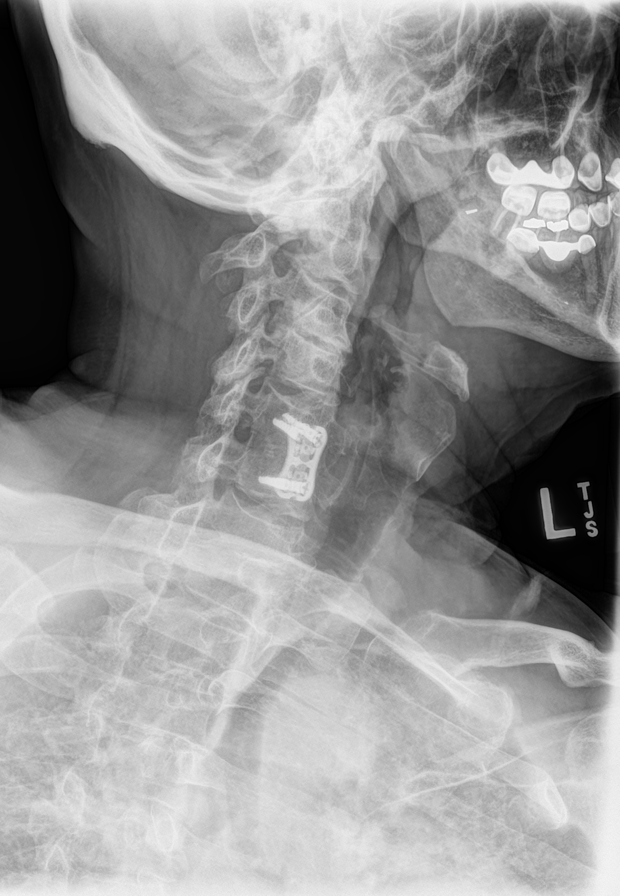
[im 4/8]
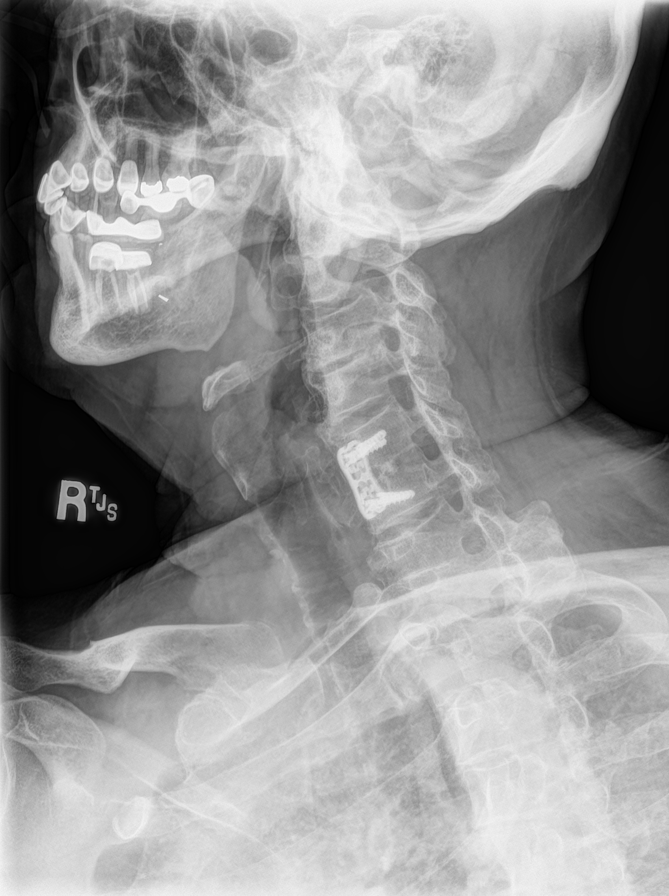
[im 5/8]
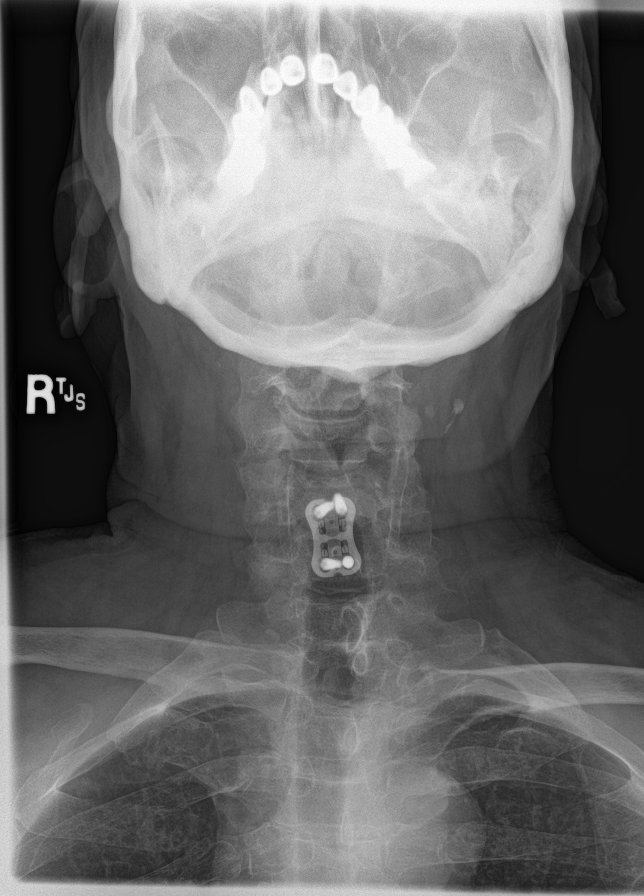
[im 6/8]
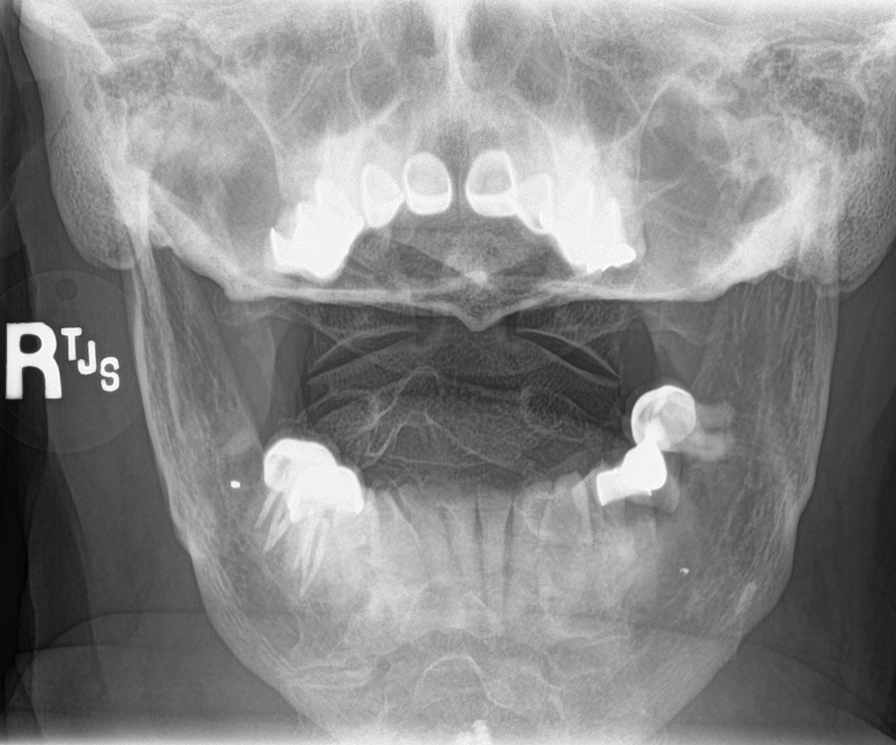
[im 7/8]
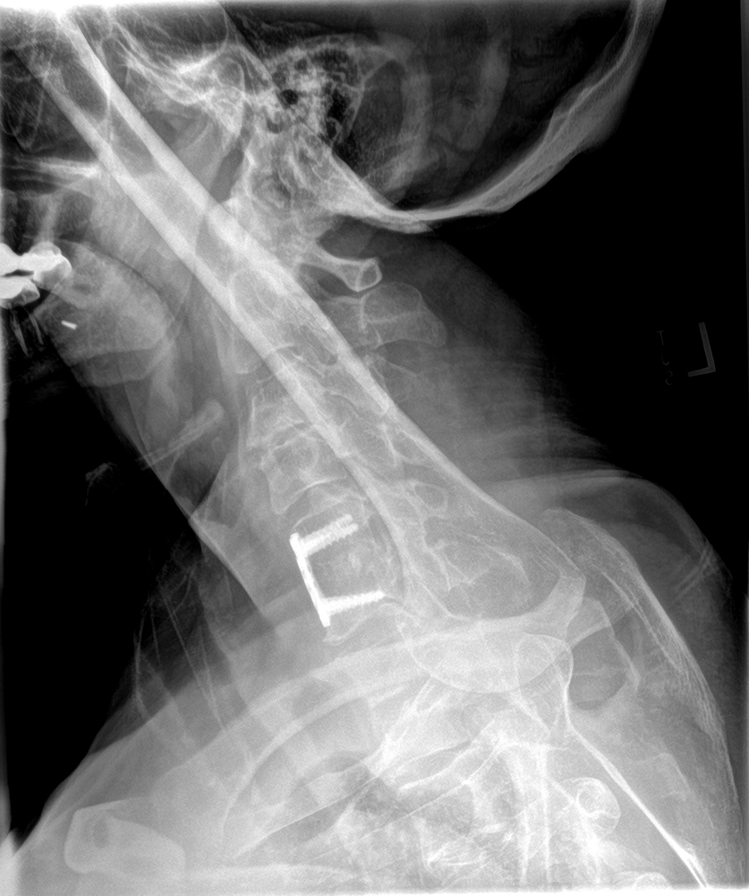
[im 8/8]
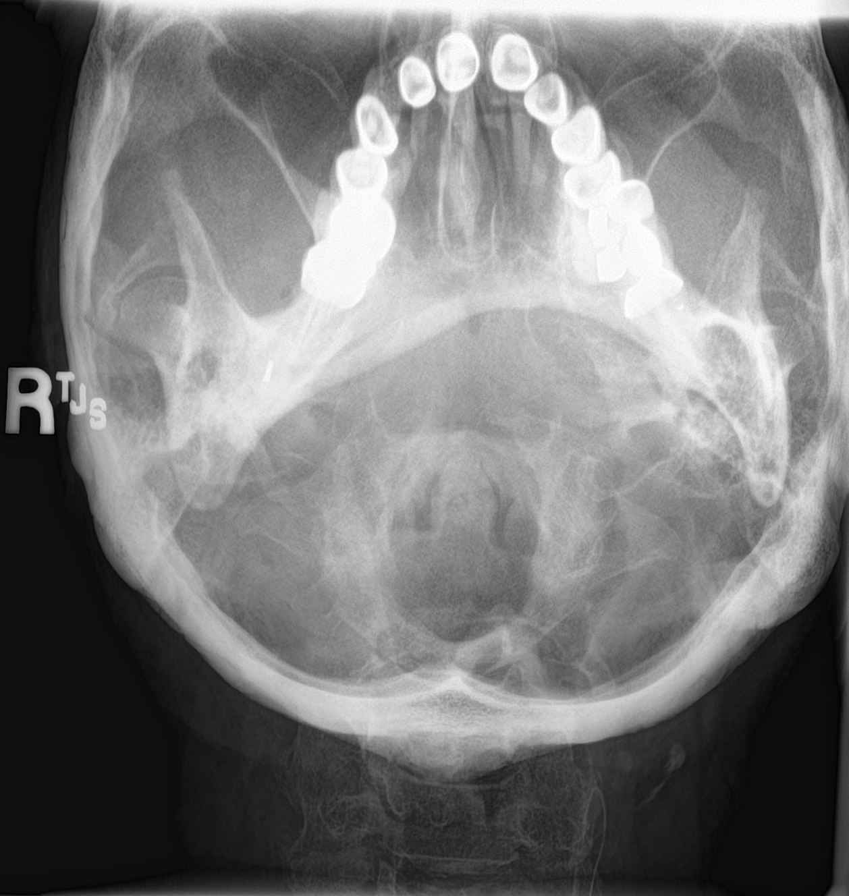

[8 of 8 positions shown; findings below may reference images not displayed]

FINDINGS: C5-C6 anterior fusion. Hardware intact. Diffuse osteopenia and
degenerative change. Disc degeneration and endplate osteophyte
formation most prominent at C3-C4. No acute bony abnormality. No
evidence of fracture or dislocation. Mild multifocal right-sided
neural foraminal narrowing. Pulmonary apices are clear. Left carotid
vascular calcification.
IMPRESSION: 1.  C5-C6 anterior fusion.  Hardware intact.

2. Diffuse osteopenia degenerative change. Degenerative changes most
prominent at C3-C4. No acute bony abnormality identified. Mild
multifocal right-sided neural foraminal narrowing.

3.  Carotid atherosclerotic vascular disease.

## 2022-02-24 ENCOUNTER — Other Ambulatory Visit: Payer: Self-pay | Admitting: Family Medicine

## 2022-02-24 DIAGNOSIS — G8929 Other chronic pain: Secondary | ICD-10-CM

## 2022-02-26 MED ORDER — HYDROCODONE-ACETAMINOPHEN 10-325 MG PO TABS: 1.0000 | ORAL_TABLET | Freq: Four times a day (QID) | ORAL | 0 refills | Status: DC | PRN

## 2022-02-26 NOTE — Telephone Encounter (Signed)
Requested medication (s) are due for refill today: Yes  Requested medication (s) are on the active medication list: Yes  Last refill:  02/11/22  Future visit scheduled: Yes  Notes to clinic:  Unable to refill per protocol, cannot delegate. Patient called to check on status of refill.     Requested Prescriptions  Pending Prescriptions Disp Refills   HYDROcodone-acetaminophen (NORCO) 10-325 MG tablet 60 tablet 0    Sig: Take 1 tablet by mouth every 6 (six) hours as needed.     Not Delegated - Analgesics:  Opioid Agonist Combinations Failed - 02/26/2022 12:53 PM      Failed - This refill cannot be delegated      Failed - Urine Drug Screen completed in last 360 days      Passed - Valid encounter within last 3 months    Recent Outpatient Visits           1 month ago [place], [place], PA-C   1 month ago [place], Donald E, MD   3 months ago Depression, recurrent [facility])   [place], Donald E, MD   4 months ago Hyperlipidemia, unspecified hyperlipidemia type   [facility] Birdie Sons, MD   1 year ago Chest discomfort   [place], Donald E, MD       Future Appointments             In 2 months Fisher, Kirstie Peri, MD [facility], [place]

## 2022-02-26 NOTE — Telephone Encounter (Signed)
Patient called in checking status of refill of hydrocodone, says he is all out. I told him of 48-72hr turnaround

## 2022-02-26 NOTE — Telephone Encounter (Signed)
Medication Refill - Medication: HYDROcodone-acetaminophen (NORCO) 10-325 MG tablet [id number]  Pt will be out of medication today / pt called and requested on 2.14.24 but the refill TE was not completed and had no info / please advise when refilled   Has the patient contacted their pharmacy? No. (Agent: If no, request that the patient contact the pharmacy for the refill. If patient does not wish to contact the pharmacy document the reason why and proceed with request.) (Agent: If yes, when and what did the pharmacy advise?)  Preferred Pharmacy (with phone number or street name): [id number] [place] Has the patient been seen for an appointment in the last year OR does the patient have an upcoming appointment? Yes.    Agent: Please be advised that RX refills may take up to 3 business days. We ask that you follow-up with your pharmacy.

## 2022-03-03 ENCOUNTER — Ambulatory Visit (INDEPENDENT_AMBULATORY_CARE_PROVIDER_SITE_OTHER): Payer: Medicare Other

## 2022-03-03 VITALS — Ht 69.5 in | Wt 193.0 lb

## 2022-03-03 DIAGNOSIS — Z0000 Encounter for general adult medical examination without abnormal findings: Secondary | ICD-10-CM | POA: Diagnosis not present

## 2022-03-03 NOTE — Patient Instructions (Signed)
Mr. Gary Carter , Thank you for taking time to come for your Medicare Wellness Visit. I appreciate your ongoing commitment to your health goals. Please review the following plan we discussed and let me know if I can assist you in the future.   These are the goals we discussed:  Goals      DIET - EAT MORE FRUITS AND VEGETABLES     Quit Smoking     Recommend to continue efforts to reduce smoking habits until no longer smoking.          This is a list of the screening recommended for you and due dates:  Health Maintenance  Topic Date Due   DTaP/Tdap/Td vaccine (2 - Td or Tdap) 10/07/2021   COVID-19 Vaccine (3 - Moderna risk series) 10/11/2022*   Medicare Annual Wellness Visit  03/04/2023   Colon Cancer Screening  03/07/2031   Pneumonia Vaccine  Completed   Flu Shot  Completed   Hepatitis C Screening: USPSTF Recommendation to screen - Ages 18-79 yo.  Completed   Zoster (Shingles) Vaccine  Completed   HPV Vaccine  Aged Out  *Topic was postponed. The date shown is not the original due date.    Advanced directives: yes  Conditions/risks identified: none  Next appointment: Follow up in one year for your annual wellness visit. 03/08/2023@ 9:15am telephone  Preventive Care 70 Years and Older, Male  Preventive care refers to lifestyle choices and visits with your health care provider that can promote health and wellness. What does preventive care include? A yearly physical exam. This is also called an annual well check. Dental exams once or twice a year. Routine eye exams. Ask your health care provider how often you should have your eyes checked. Personal lifestyle choices, including: Daily care of your teeth and gums. Regular physical activity. Eating a healthy diet. Avoiding tobacco and drug use. Limiting alcohol use. Practicing safe sex. Taking low doses of aspirin every day. Taking vitamin and mineral supplements as recommended by your health care provider. What happens during  an annual well check? The services and screenings done by your health care provider during your annual well check will depend on your age, overall health, lifestyle risk factors, and family history of disease. Counseling  Your health care provider may ask you questions about your: Alcohol use. Tobacco use. Drug use. Emotional well-being. Home and relationship well-being. Sexual activity. Eating habits. History of falls. Memory and ability to understand (cognition). Work and work Statistician. Screening  You may have the following tests or measurements: Height, weight, and BMI. Blood pressure. Lipid and cholesterol levels. These may be checked every 5 years, or more frequently if you are over 32 years old. Skin check. Lung cancer screening. You may have this screening every year starting at age 70 if you have a 30-pack-year history of smoking and currently smoke or have quit within the past 15 years. Fecal occult blood test (FOBT) of the stool. You may have this test every year starting at age 70. Flexible sigmoidoscopy or colonoscopy. You may have a sigmoidoscopy every 5 years or a colonoscopy every 10 years starting at age 70. Prostate cancer screening. Recommendations will vary depending on your family history and other risks. Hepatitis C blood test. Hepatitis B blood test. Sexually transmitted disease (STD) testing. Diabetes screening. This is done by checking your blood sugar (glucose) after you have not eaten for a while (fasting). You may have this done every 1-3 years. Abdominal aortic aneurysm (AAA) screening. You  may need this if you are a current or former smoker. Osteoporosis. You may be screened starting at age 70 if you are at high risk. Talk with your health care provider about your test results, treatment options, and if necessary, the need for more tests. Vaccines  Your health care provider may recommend certain vaccines, such as: Influenza vaccine. This is recommended  every year. Tetanus, diphtheria, and acellular pertussis (Tdap, Td) vaccine. You may need a Td booster every 10 years. Zoster vaccine. You may need this after age 18. Pneumococcal 13-valent conjugate (PCV13) vaccine. One dose is recommended after age 63. Pneumococcal polysaccharide (PPSV23) vaccine. One dose is recommended after age 6. Talk to your health care provider about which screenings and vaccines you need and how often you need them. This information is not intended to replace advice given to you by your health care provider. Make sure you discuss any questions you have with your health care provider. Document Released: 01/24/2015 Document Revised: 09/17/2015 Document Reviewed: 10/29/2014 Elsevier Interactive Patient Education  2017 [place] Prevention in the Home Falls can cause injuries. They can happen to people of all ages. There are many things you can do to make your home safe and to help prevent falls. What can I do on the outside of my home? Regularly fix the edges of walkways and driveways and fix any cracks. Remove anything that might make you trip as you walk through a door, such as a raised step or threshold. Trim any bushes or trees on the path to your home. Use bright outdoor lighting. Clear any walking paths of anything that might make someone trip, such as rocks or tools. Regularly check to see if handrails are loose or broken. Make sure that both sides of any steps have handrails. Any raised decks and porches should have guardrails on the edges. Have any leaves, snow, or ice cleared regularly. Use sand or salt on walking paths during winter. Clean up any spills in your garage right away. This includes oil or grease spills. What can I do in the bathroom? Use night lights. Install grab bars by the toilet and in the tub and shower. Do not use towel bars as grab bars. Use non-skid mats or decals in the tub or shower. If you need to sit down in the shower,  use a plastic, non-slip stool. Keep the floor dry. Clean up any water that spills on the floor as soon as it happens. Remove soap buildup in the tub or shower regularly. Attach bath mats securely with double-sided non-slip rug tape. Do not have throw rugs and other things on the floor that can make you trip. What can I do in the bedroom? Use night lights. Make sure that you have a light by your bed that is easy to reach. Do not use any sheets or blankets that are too big for your bed. They should not hang down onto the floor. Have a firm chair that has side arms. You can use this for support while you get dressed. Do not have throw rugs and other things on the floor that can make you trip. What can I do in the kitchen? Clean up any spills right away. Avoid walking on wet floors. Keep items that you use a lot in easy-to-reach places. If you need to reach something above you, use a strong step stool that has a grab bar. Keep electrical cords out of the way. Do not use floor polish or wax that  makes floors slippery. If you must use wax, use non-skid floor wax. Do not have throw rugs and other things on the floor that can make you trip. What can I do with my stairs? Do not leave any items on the stairs. Make sure that there are handrails on both sides of the stairs and use them. Fix handrails that are broken or loose. Make sure that handrails are as long as the stairways. Check any carpeting to make sure that it is firmly attached to the stairs. Fix any carpet that is loose or worn. Avoid having throw rugs at the top or bottom of the stairs. If you do have throw rugs, attach them to the floor with carpet tape. Make sure that you have a light switch at the top of the stairs and the bottom of the stairs. If you do not have them, ask someone to add them for you. What else can I do to help prevent falls? Wear shoes that: Do not have high heels. Have rubber bottoms. Are comfortable and fit you  well. Are closed at the toe. Do not wear sandals. If you use a stepladder: Make sure that it is fully opened. Do not climb a closed stepladder. Make sure that both sides of the stepladder are locked into place. Ask someone to hold it for you, if possible. Clearly mark and make sure that you can see: Any grab bars or handrails. First and last steps. Where the edge of each step is. Use tools that help you move around (mobility aids) if they are needed. These include: Canes. Walkers. Scooters. Crutches. Turn on the lights when you go into a dark area. Replace any light bulbs as soon as they burn out. Set up your furniture so you have a clear path. Avoid moving your furniture around. If any of your floors are uneven, fix them. If there are any pets around you, be aware of where they are. Review your medicines with your doctor. Some medicines can make you feel dizzy. This can increase your chance of falling. Ask your doctor what other things that you can do to help prevent falls. This information is not intended to replace advice given to you by your health care provider. Make sure you discuss any questions you have with your health care provider. Document Released: 10/24/2008 Document Revised: 06/05/2015 Document Reviewed: 02/01/2014 Elsevier Interactive Patient Education  2017 [employer].

## 2022-03-03 NOTE — Progress Notes (Signed)
I connected with  Gary Carter on 03/03/22 by a audio/telephone and verified that I am speaking with the correct person using two identifiers.  Patient Location: Home  Provider Location: Office/Clinic  I discussed the limitations of evaluation and management by telemedicine. The patient expressed understanding and agreed to proceed.  Subjective:   Gary Carter is a 72 y.o. male who presents for Medicare Annual/Subsequent preventive examination.  Review of Systems     Cardiac Risk Factors include: advanced age (>65mn, >>52women);dyslipidemia;hypertension;male gender;smoking/ tobacco exposure     Objective:    Today's Vitals   03/03/22 0852  Weight: 193 lb (87.5 kg)  Height: 5' 9.5" (1.765 m)   Body mass index is 28.09 kg/m.     03/03/2022    9:07 AM 12/26/2021    5:05 AM 02/12/2021   11:08 AM 03/22/2020    8:08 AM 05/15/2019    3:29 PM 07/10/2018    1:22 PM  Advanced Directives  Does Patient Have a Medical Advance Directive? Yes No No No Yes No  Type of AParamedicof [place] will   Copy of [place]? No - copy requested    No - copy requested   Would patient like information on creating a medical advance directive?   No - Patient declined   No - Patient declined    Current Medications (verified) Outpatient Encounter Medications as of 03/03/2022  Medication Sig   amLODipine (NORVASC) 10 MG tablet TAKE 1 TABLET BY MOUTH EVERY DAY   aspirin 325 MG tablet Take 325 mg by mouth daily.    cetirizine (ZYRTEC) 10 MG tablet TAKE 1 TABLET (10 MG TOTAL) BY MOUTH DAILY. FOR ALLERGIES   citalopram (CELEXA) 40 MG tablet TAKE 1 TABLET BY MOUTH EVERY DAY   Cyanocobalamin (VITAMIN B-12 PO) Take by mouth daily.   fluticasone (FLONASE) 50 MCG/ACT nasal spray SPRAY 2 SPRAYS INTO EACH NOSTRIL EVERY DAY   HYDROcodone-acetaminophen (NORCO) 10-325 MG tablet Take 1 tablet by mouth every 6 (six) hours  as needed.   metoprolol succinate (TOPROL-XL) 50 MG 24 hr tablet Take 1 tablet (50 mg total) by mouth every evening. Take with or immediately following a meal.   omeprazole (PRILOSEC) 40 MG capsule TAKE 1 CAPSULE (40 MG TOTAL) BY MOUTH DAILY.   pravastatin (PRAVACHOL) 40 MG tablet TAKE 1 TABLET BY MOUTH EVERY DAY   sildenafil (REVATIO) 20 MG tablet TAKE 1 TO 2 TABLETS BY MOUTH DAILY   spironolactone (ALDACTONE) 25 MG tablet TAKE 1 TABLET BY MOUTH EVERY DAY   valsartan-hydrochlorothiazide (DIOVAN-HCT) 160-25 MG tablet TAKE 1 TABLET BY MOUTH DAILY. TAKE IN PLACE OF ATACAND-HCTZ   Vitamin E 268 MG (400 UNIT) CAPS Take by mouth.   Cholecalciferol (VITAMIN D3 PO) Take by mouth daily. (Patient not taking: Reported on 03/03/2022)   clonazePAM (KLONOPIN) 0.5 MG tablet Take 1 tablet (0.5 mg total) by mouth every 8 (eight) hours as needed. for anxiety (Patient not taking: Reported on 03/03/2022)   cyclobenzaprine (FLEXERIL) 5 MG tablet Take 1 tablet (5 mg total) by mouth 3 (three) times daily as needed for muscle spasms. (Patient not taking: Reported on 03/03/2022)   [DISCONTINUED] POTASSIUM PO Take daily by mouth.   No facility-administered encounter medications on file as of 03/03/2022.    Allergies (verified) Hctz [hydrochlorothiazide]   History: Past Medical History:  Diagnosis Date   Anxiety    Barrett's esophagus  GERD (gastroesophageal reflux disease)    History of chicken pox 06/12/2014   DID have Chicken Pox. DID have Measles. DID have Mumps. DID have Rubella.     History of dysphagia    History of fatty infiltration of liver    History of measles    History of mumps    History of rubella    Hyperlipidemia    Hypertension    Sleep apnea    SOB (shortness of breath)    Past Surgical History:  Procedure Laterality Date   ABDOMINAL SURGERY     BACK SURGERY  12-04-2003   Lumbar fusion & C5-C6 fusion   ESOPHAGOGASTRODUODENOSCOPY (EGD) WITH PROPOFOL N/A 07/10/2018   Procedure:  ESOPHAGOGASTRODUODENOSCOPY (EGD) WITH PROPOFOL;  Surgeon: Manya Silvas, MD;  Location[facility] END[facility]COPY;  Service: Endoscopy;  Laterality: N/A;   HERNIA REPAIR     Myocardial perfusion scan  10/10/2006   NECK SURGERY     NERVE, TENDON AND ARTERY REPAIR Left 10/18/2012   Procedure: NERVE, TENDON AND ARTERY REPAIR;  Surgeon: Linna Hoff, MD;  Location: [facility];  Service: Orthopedics;  Laterality: Left;   NISSEN FUNDOPLICATION  [id number]   UPPER GI ENDOSCOPY  04/09/2007   Dr. Tiffany Kocher; Mucosal changes suspicious for Barretts esophagits   WOUND EXPLORATION Left 10/18/2012   Procedure: WOUND EXPLORATION;  Surgeon: Linna Hoff, MD;  Location: [facility];  Service: Orthopedics;  Laterality: Left;   Family History  Problem Relation Age of Onset   Hypertension Sister    Bipolar disorder Sister    Heart attack Father 32   Emphysema Mother    Heart disease Mother    Diabetes Daughter 3   Bipolar disorder Brother    Hypertension Brother    Obesity Brother    Social History   Socioeconomic History   Marital status: Divorced    Spouse name: Not on file   Number of children: 1   Years of education: Not on file   Highest education level: High school graduate  Occupational History   Occupation: Works at [employer]  Tobacco Use   Smoking status: Every Day    Packs/day: 0.50    Years: 5.00    Total pack years: 2.50    Types: Cigarettes   Smokeless tobacco: Never  Vaping Use   Vaping Use: Never used  Substance and Sexual Activity   Alcohol use: Yes    Alcohol/week: 8.0 standard drinks of alcohol    Types: 8 Shots of liquor per week    Comment: 2 drinks 4 nights a week   Drug use: Not Currently    Comment: previously smoked marijuana years ago   Sexual activity: Not on file  Other Topics Concern   Not on file  Social History Narrative   Not on file   Social Determinants of Health   Financial Resource Strain: Low Risk  (03/03/2022)   Overall Financial Resource Strain (CARDIA)    Difficulty of  Paying Living Expenses: Not hard at all  Food Insecurity: No Food Insecurity (03/03/2022)   Hunger Vital Sign    Worried About Running Out of Food in the Last Year: Never true    Ran Out of Food in the Last Year: Never true  Transportation Needs: No Transportation Needs (03/03/2022)   PRAPARE - Hydrologist (Medical): No    Lack of Transportation (Non-Medical): No  Physical Activity: Sufficiently Active (03/03/2022)   Exercise Vital Sign    Days of Exercise per  Week: 5 days    Minutes of Exercise per Session: 120 min  Stress: No Stress Concern Present (03/03/2022)   [place]    Feeling of Stress : Not at all  Social Connections: Moderately Isolated (03/03/2022)   Social Connection and Isolation Panel [NHANES]    Frequency of Communication with Friends and Family: More than three times a week    Frequency of Social Gatherings with Friends and Family: More than three times a week    Attends Religious Services: 1 to 4 times per year    Active Member of [employer] or Organizations: No    Attends Music therapist: Never    Marital Status: Divorced    Tobacco Counseling Ready to quit: Not Answered Counseling given: Not Answered   Clinical Intake:  Pre-visit preparation completed: Yes  Pain : No/denies pain     BMI - recorded: 28.09 Nutritional Status: BMI 25 -29 Overweight Nutritional Risks: None Diabetes: No  How often do you need to have someone help you when you read instructions, pamphlets, or other written materials from your doctor or pharmacy?: 1 - Never  Diabetic?no  Interpreter Needed?: No  Information entered by :: B.Dalayna Lauter,LPN   Activities of Daily Living    03/03/2022    9:07 AM 01/27/2022    8:19 AM  In your present state of health, do you have any difficulty performing the following activities:  Hearing? 0 0  Vision? 0 0  Difficulty concentrating or  making decisions? 0 0  Walking or climbing stairs? 0 0  Dressing or bathing? 0 0  Doing errands, shopping? 0 0  Preparing Food and eating ? N   Using the Toilet? N   In the past six months, have you accidently leaked urine? N   Do you have problems with loss of bowel control? N   Managing your Medications? N   Managing your Finances? N   Housekeeping or managing your Housekeeping? N     Patient Care Team: Birdie Sons, MD as PCP - General (Family Medicine)  Indicate any recent Medical Services you may have received from other than [facility] providers in the past year (date may be approximate).     Assessment:   This is a routine wellness examination for Gary Carter.  Hearing/Vision screen Hearing Screening - Comments:: Adequate hearing Vision Screening - Comments:: Adequate vision w/glasses. [facility]  Dietary issues and exercise activities discussed: Current Exercise Habits: Home exercise routine (pt says he walks all day on his job), Type of exercise: walking, Time (Minutes): > 60, Frequency (Times/Week): 5, Weekly Exercise (Minutes/Week): 0, Intensity: Mild, Exercise limited by: None identified   Goals Addressed             This Visit's Progress    Quit Smoking   On track    Recommend to continue efforts to reduce smoking habits until no longer smoking.         Depression Screen    03/03/2022    8:58 AM 01/27/2022    8:19 AM 01/13/2022   10:01 AM 10/30/2021   11:37 AM 10/06/2021   10:46 AM 02/12/2021   11:06 AM 01/23/2021   12:57 PM  PHQ 2/9 Scores  PHQ - 2 Score 0 0 0 0 0 0 0  PHQ- 9 Score 0 0 1 0 0 1 1    Fall Risk    03/03/2022    8:54 AM 01/27/2022    8:19  AM 01/13/2022   10:01 AM 10/06/2021   10:45 AM 02/12/2021   11:09 AM  Fall Risk   Falls in the past year? 0 0 0 0 0  Number falls in past yr: 0 0 0 0 0  Injury with Fall? 0 0 0 0 0  Risk for fall due to : No Fall Risks No Fall Risks No Fall Risks No Fall Risks No Fall Risks  Follow up Education  provided;Falls prevention discussed Falls evaluation completed Falls evaluation completed Falls evaluation completed Falls evaluation completed    FALL RISK PREVENTION PERTAINING TO THE HOME:  Any stairs in or around the home? Yes  If so, are there any without handrails? Yes  Home free of loose throw rugs in walkways, pet beds, electrical cords, etc? Yes  Adequate lighting in your home to reduce risk of falls? Yes   ASSISTIVE DEVICES UTILIZED TO PREVENT FALLS:  Life alert? No  Use of a cane, walker or w/c? No  Grab bars in the bathroom? NO Shower chair or bench in shower? Yes  Elevated toilet seat or a handicapped toilet? Yes    Cognitive Function:        03/03/2022    9:03 AM  6CIT Screen  What Year? 0 points  What month? 0 points  What time? 0 points  Count back from 20 0 points  Months in reverse 0 points  Repeat phrase 0 points  Total Score 0 points    Immunizations Immunization History  Administered Date(s) Administered   Covid-19, Mrna,Vaccine(Spikevax)63yr and older 10/01/2021   Fluad Quad(high Dose 65+) 11/27/2019, 09/16/2020, 10/01/2021   Influenza Split 10/08/2011   Influenza, High Dose Seasonal PF 09/16/2017   Influenza,inj,Quad PF,6+ Mos 10/09/2013, 09/14/2016   Influenza-Unspecified 10/29/2014, 09/16/2017, 10/01/2018   Moderna Sars-Covid-2 Vaccination 11/27/2019   Pneumococcal Conjugate-13 11/15/2017   Pneumococcal Polysaccharide-23 10/08/2011, 05/18/2019   Tdap 10/08/2011   Zoster Recombinat (Shingrix) 02/07/2017, 04/21/2017   Zoster, Live 10/09/2013    TDAP status: Up to date  Flu Vaccine status: Up to date  Pneumococcal vaccine status: Up to date  Covid-19 vaccine status: Completed vaccines  Qualifies for Shingles Vaccine? Yes   Zostavax completed Yes   Shingrix Completed?: Yes  Screening Tests Health Maintenance  Topic Date Due   DTaP/Tdap/Td (2 - Td or Tdap) 10/07/2021   COVID-19 Vaccine (3 - Moderna risk series) 10/11/2022  (Originally 10/29/2021)   Medic[place] Annual Wellness ([place]  03/04/2023   COLONOSCOPY (Pts 45-476yrInsurance coverage will need to be confirmed)  03/07/2031   Pneumonia Vaccine 6512Years old  Completed   INFLUENZA VACCINE  Completed   Hepatitis C Screening  Completed   Zoster Vaccines- Shingrix  Completed   HPV VACCINES  Aged Out    Health Maintenance  Health Maintenance Due  Topic Date Due   DTaP/Tdap/Td (2 - Td or Tdap) 10/07/2021    Colorectal cancer screening: Type of screening: Colonoscopy. Completed yes. Repeat every 5 years  Lung Cancer Screening: (Low Dose CT Chest recommended if Age 70-80ears, 30 pack-year currently smoking OR have quit w/in 15years.) does qualify.   Lung Cancer Screening Referral: no  Additional Screening:  Hepatitis C Screening: does not qualify; Completed yes  Vision Screening: Recommended annual ophthalmology exams for early detection of glaucoma and other disorders of the eye. Is the patient up to date with their annual eye exam?  Yes  Who is the provider or what is the name of the office in which the patient attends annual  eye exams? [place] If pt is not established with a provider, would they like to be referred to a provider to establish care? No .   Dental Screening: Recommended annual dental exams for proper oral hygiene  Community Resource Referral / Chronic Care Management: CRR required this visit?  No   CCM required this visit?  No      Plan:     I have personally reviewed and noted the following in the patient's chart:   Medical and social history Use of alcohol, tobacco or illicit drugs  Current medications and supplements including opioid prescriptions. Patient is currently taking opioid prescriptions. Information provided to patient regarding non-opioid alternatives. Patient advised to discuss non-opioid treatment plan with their provider. Functional ability and status Nutritional status Physical activity Advanced  directives List of other physicians Hospitalizations, surgeries, and ER visits in previous 12 months Vitals Screenings to include cognitive, depression, and falls Referrals and appointments  In addition, I have reviewed and discussed with patient certain preventive protocols, quality metrics, and best practice recommendations. A written personalized care plan for preventive services as well as general preventive health recommendations were provided to patient.     Roger Shelter, LPN   [id number]   Nurse Notes: pt states he is doing well and has no questions or concerns.

## 2022-03-11 ENCOUNTER — Other Ambulatory Visit: Payer: Self-pay | Admitting: Family Medicine

## 2022-03-11 DIAGNOSIS — G8929 Other chronic pain: Secondary | ICD-10-CM

## 2022-03-11 NOTE — Telephone Encounter (Signed)
Medication Refill - Medication: HYDROcodone-acetaminophen (NORCO) 10-325 MG tablet   Has the patient contacted their pharmacy? No. (Agent: If no, request that the patient contact the pharmacy for the refill. If patient does not wish to contact the pharmacy document the reason why and proceed with request.) (Agent: If yes, when and what did the pharmacy advise?)  Preferred Pharmacy (with phone number or street name[facility]  [id number] [place]: [phone] Fax: [phone]    Has the patient been seen for an appointment in the last year OR does the patient have an upcoming appointment? Yes.    Agent: Please be advised that RX refills may take up to 3 business days. We ask that you follow-up with your pharmacy.

## 2022-03-11 NOTE — Telephone Encounter (Signed)
Requested medication (s) are due for refill today: in 2 days  Requested medication (s) are on the active medication list: yes  Last refill:  02/26/22 #60  Future visit scheduled: yes  Notes to clinic:  med not delegated to NT to RF    Requested Prescriptions  Pending Prescriptions Disp Refills   HYDROcodone-acetaminophen (NORCO) 10-325 MG tablet 60 tablet 0    Sig: Take 1 tablet by mouth every 6 (six) hours as needed.     Not Delegated - Analgesics:  Opioid Agonist Combinations Failed - 03/11/2022  9:47 AM      Failed - This refill cannot be delegated      Failed - Urine Drug Screen completed in last 360 days      Passed - Valid encounter within last 3 months    Recent Outpatient Visits           1 month ago [place], [place], PA-C   1 month ago [place], Donald E, MD   4 months ago Depression, recurrent [facility])   [place], Donald E, MD   5 months ago Hyperlipidemia, unspecified hyperlipidemia type   [facility] Birdie Sons, MD   1 year ago Chest discomfort   [place], Donald E, MD       Future Appointments             In 1 month Fisher, Kirstie Peri, MD [facility], [place]

## 2022-03-12 MED ORDER — HYDROCODONE-ACETAMINOPHEN 10-325 MG PO TABS: 1.0000 | ORAL_TABLET | Freq: Four times a day (QID) | ORAL | 0 refills | Status: DC | PRN

## 2022-03-25 ENCOUNTER — Other Ambulatory Visit: Payer: Self-pay | Admitting: Family Medicine

## 2022-03-25 DIAGNOSIS — G8929 Other chronic pain: Secondary | ICD-10-CM

## 2022-03-25 NOTE — Telephone Encounter (Signed)
Medication Refill - Medication: HYDROcodone-acetaminophen (NORCO) 10-325 MG tablet   Has the patient contacted their pharmacy? No because of controlled substance (Agent: If no, request that the patient contact the pharmacy for the refill. If patient does not wish to contact the pharmacy document the reason why and proceed with request.) (Agent: If yes, when and what did the pharmacy advise?)contacted pcp directly  Preferred Pharmacy (with phone number or street name):  [place] [place]   Phone: [phone] Fax: [phone] Has the patient been seen for an appointment in the last year OR does the patient have an upcoming appointment? yes  Agent: Please be advised that RX refills may take up to 3 business days. We ask that you follow-up with your pharmacy.

## 2022-03-25 NOTE — Telephone Encounter (Signed)
Requested medication (s) are due for refill today - provider review   Requested medication (s) are on the active medication list -yes  Future visit scheduled -yes  Last refill: 03/12/22 #60  Notes to clinic: non delegated Rx  Requested Prescriptions  Pending Prescriptions Disp Refills   HYDROcodone-acetaminophen (NORCO) 10-325 MG tablet 60 tablet 0    Sig: Take 1 tablet by mouth every 6 (six) hours as needed.     Not Delegated - Analgesics:  Opioid Agonist Combinations Failed - 03/25/2022 10:24 AM      Failed - This refill cannot be delegated      Failed - Urine Drug Screen completed in last 360 days      Passed - Valid encounter within last 3 months    Recent Outpatient Visits           1 month ago [place], [place], PA-C   2 months ago [place], Donald E, MD   4 months ago Depression, recurrent [facility])   [place], Donald E, MD   5 months ago Hyperlipidemia, unspecified hyperlipidemia type   [facility] Birdie Sons, MD   1 year ago Chest discomfort   [place], MD       Future Appointments             In 1 month Fisher, Kirstie Peri, MD [facility], [facility]               Requested Prescriptions  Pending Prescriptions Disp Refills   HYDROcodone-acetaminophen (NORCO) 10-325 MG tablet 60 tablet 0    Sig: Take 1 tablet by mouth every 6 (six) hours as needed.     Not Delegated - Analgesics:  Opioid Agonist Combinations Failed - 03/25/2022 10:24 AM      Failed - This refill cannot be delegated      Failed - Urine Drug Screen completed in last 360 days      Passed - Valid encounter within last 3 months    Recent Outpatient Visits           1 month ago [place], [place], PA-C   2 months  ago [place], Donald E, MD   4 months ago Depression, recurrent [facility])   [place], Donald E, MD   5 months ago Hyperlipidemia, unspecified hyperlipidemia type   [facility] Birdie Sons, MD   1 year ago Chest discomfort   [place], Donald E, MD       Future Appointments             In 1 month Fisher, Kirstie Peri, MD [facility], [place]

## 2022-03-26 MED ORDER — HYDROCODONE-ACETAMINOPHEN 10-325 MG PO TABS: 1.0000 | ORAL_TABLET | Freq: Four times a day (QID) | ORAL | 0 refills | Status: DC | PRN

## 2022-04-08 ENCOUNTER — Other Ambulatory Visit: Payer: Self-pay | Admitting: Family Medicine

## 2022-04-08 DIAGNOSIS — G8929 Other chronic pain: Secondary | ICD-10-CM

## 2022-04-08 NOTE — Telephone Encounter (Signed)
Requested medications are due for refill today.  Provider to determine  Requested medications are on the active medications list.  yes  Last refill. 03/26/2022 #60 0 rf  Future visit scheduled.   yes  Notes to clinic.  Refill not delegated.    Requested Prescriptions  Pending Prescriptions Disp Refills   HYDROcodone-acetaminophen (NORCO) 10-325 MG tablet 60 tablet 0    Sig: Take 1 tablet by mouth every 6 (six) hours as needed.     Not Delegated - Analgesics:  Opioid Agonist Combinations Failed - 04/08/2022 10:23 AM      Failed - This refill cannot be delegated      Failed - Urine Drug Screen completed in last 360 days      Passed - Valid encounter within last 3 months    Recent Outpatient Visits           2 months ago [place], [place], PA-C   2 months ago [place], Donald E, MD   5 months ago Depression, recurrent [facility])   [place], Donald E, MD   6 months ago Hyperlipidemia, unspecified hyperlipidemia type   [facility] Birdie Sons, MD   1 year ago Chest discomfort   [place], MD       Future Appointments             In 3 weeks Fisher, Kirstie Peri, MD [facility], [place]

## 2022-04-08 NOTE — Telephone Encounter (Signed)
Medication Refill - Medication: HYDROcodone-acetaminophen (NORCO) 10-325 MG tablet   Has the patient contacted their pharmacy? No. Patient stated he gets this med filled every 15 day  Preferred Pharmacy (with phone number or [facility] name[facility]  [id number] [place] [place] Phone: [phone]  Fax: [phone]     Has the patient been seen for an appointment in the last year OR does the patient have an upcoming appointment? Yes.

## 2022-04-09 MED ORDER — HYDROCODONE-ACETAMINOPHEN 10-325 MG PO TABS: 1.0000 | ORAL_TABLET | Freq: Four times a day (QID) | ORAL | 0 refills | Status: DC | PRN

## 2022-04-22 ENCOUNTER — Other Ambulatory Visit: Payer: Self-pay | Admitting: Family Medicine

## 2022-04-22 DIAGNOSIS — G8929 Other chronic pain: Secondary | ICD-10-CM

## 2022-04-22 NOTE — Telephone Encounter (Signed)
Medication Refill - Medication: HYDROcodone-acetaminophen (NORCO) 10-325 MG tablet   Has the patient contacted their pharmacy? No. Patient stated he did not contact the pharmacy because of he type of medication it is  Preferred Pharmacy (with phone number or street name):  [id number] [place], [place] - [address] Phone: [phone]  Fax: [phone]     Has the patient been seen for an appointment in the last year OR does the patient have an upcoming appointment? Yes.    Agent: Please be advised that RX refills may take up to 3 business days. We ask that you follow-up with your pharmacy.

## 2022-04-23 MED ORDER — HYDROCODONE-ACETAMINOPHEN 10-325 MG PO TABS: 1.0000 | ORAL_TABLET | Freq: Four times a day (QID) | ORAL | 0 refills | Status: DC | PRN

## 2022-04-23 NOTE — Telephone Encounter (Signed)
Requested medication (s) are due for refill today: no  Requested medication (s) are on the active medication list: yes  Last refill:  04/09/22  Future visit scheduled: yes  Notes to clinic:  Unable to refill per protocol, cannot delegate. Unable to refuse.      Requested Prescriptions  Pending Prescriptions Disp Refills   HYDROcodone-acetaminophen (NORCO) 10-325 MG tablet 60 tablet 0    Sig: Take 1 tablet by mouth every 6 (six) hours as needed.     Not Delegated - Analgesics:  Opioid Agonist Combinations Failed - 04/22/2022  5:47 PM      Failed - This refill cannot be delegated      Failed - Urine Drug Screen completed in last 360 days      Passed - Valid encounter within last 3 months    Recent Outpatient Visits           2 months ago Enteritis   [place] [facility] [place], [place], PA-C   3 months ago Enteritis   [facility] Malva Limes, MD   5 months ago Depression, recurrent [facility])   [place] [facility] Malva Limes, MD   6 months ago Hyperlipidemia, unspecified hyperlipidemia type   [facility] Malva Limes, MD   1 year ago Chest discomfort   [place] [facility] Malva Limes, MD       Future Appointments             In 1 week Fisher, Demetrios Isaacs, MD [facility], [facility]

## 2022-04-26 ENCOUNTER — Ambulatory Visit: Payer: Self-pay

## 2022-04-26 NOTE — Telephone Encounter (Signed)
Chief Complaint: Foot/ankle swelling to right foot Symptoms: Redness to big toe joint, pain without shoes 5, with shoes on 8-10 (wearing flip flops), feels tired Frequency: Onset Friday Pertinent Negatives: Patient denies other symptoms Disposition: [] ED /[] Urgent Care (no appt availability in office) / [x] Appointment(In office/virtual)/ []  [facility]/ [] Home Care/ [] Refused Recommended Disposition /[] [facility]/ []  Follow-up with PCP Additional Notes: N/A    Reason for Disposition  SEVERE swelling (e.g., can't move swollen ankle at all)  Answer Assessment - Initial Assessment Questions 1. LOCATION: "Which ankle is swollen?" "Where is the swelling?"     Right foot and ankle 2. ONSET: "When did the swelling start?"     Noticed it on Friday 3. SWELLING: "How bad is the swelling?" Or, "How large is it?" (e.g., mild, moderate, severe; size of localized swelling)    - NONE: No joint swelling.   - LOCALIZED: Localized; small area of puffy or swollen skin (e.g., insect bite, skin irritation).   - MILD: Joint looks or feels mildly swollen or puffy.   - MODERATE: Swollen; interferes with normal activities (e.g., work or school); decreased range of movement; may be limping.   - SEVERE: Very swollen; can't move swollen joint at all; limping a lot or unable to walk.     Unable to put shoe on 4. PAIN: "Is there any pain?" If Yes, ask: "How bad is it?" (Scale 1-10; or mild, moderate, severe)   - NONE (0): no pain.   - MILD (1-3): doesn't interfere with normal activities.    - MODERATE (4-7): interferes with normal activities (e.g., work or school) or awakens from sleep, limping.    - SEVERE (8-10): excruciating pain, unable to do any normal activities, unable to walk.       Right now a 5, but shoe on raises pain level to 8-10 5. CAUSE: "What do you think caused the ankle swelling?"     Possible gout 6. OTHER SYMPTOMS: "Do you have any other symptoms?" (e.g., fever, chest  pain, difficulty breathing, calf pain)     Feel tired, redness past the big toe on the joint  Protocols used: Ankle Swelling-A-AH

## 2022-04-27 ENCOUNTER — Ambulatory Visit (INDEPENDENT_AMBULATORY_CARE_PROVIDER_SITE_OTHER): Payer: Medicare Other | Admitting: Family Medicine

## 2022-04-27 VITALS — BP 117/84 | HR 68 | Temp 98.2°F | Wt 197.0 lb

## 2022-04-27 DIAGNOSIS — R739 Hyperglycemia, unspecified: Secondary | ICD-10-CM

## 2022-04-27 DIAGNOSIS — M79674 Pain in right toe(s): Secondary | ICD-10-CM | POA: Diagnosis not present

## 2022-04-27 DIAGNOSIS — I10 Essential (primary) hypertension: Secondary | ICD-10-CM | POA: Diagnosis not present

## 2022-04-27 DIAGNOSIS — M7989 Other specified soft tissue disorders: Secondary | ICD-10-CM

## 2022-04-27 MED ORDER — CEPHALEXIN 500 MG PO CAPS: 500.0000 mg | ORAL_CAPSULE | Freq: Four times a day (QID) | ORAL | 0 refills | Status: AC

## 2022-04-27 MED ORDER — COLCHICINE 0.6 MG PO TABS: ORAL_TABLET | ORAL | 1 refills | Status: DC

## 2022-04-27 NOTE — Progress Notes (Signed)
Vivien Rota DeSanto,acting as a scribe for Mila Merry, MD.,have documented all relevant documentation on the behalf of Mila Merry, MD,as directed by  Mila Merry, MD while in the presence of Mila Merry, MD.     Established patient visit   Patient: Gary Carter   DOB: Jan 01, 1951   72 y.o. Male  MRN: [id number] Visit Date: 04/27/2022  Today's healthcare provider: Mila Merry, MD    Subjective    HPI  Patient is a 72 year old male who presents for evaluation of right foot swelling.  He states he has had no known trauma.  No prior history of gout. No recent changes in medications or diet. He first noticed the pain and swelling 5 days ago.  There is some redness to it.  Medications: Outpatient Medications Prior to Visit  Medication Sig   amLODipine (NORVASC) 10 MG tablet TAKE 1 TABLET BY MOUTH EVERY DAY   aspirin 325 MG tablet Take 325 mg by mouth daily.    cetirizine (ZYRTEC) 10 MG tablet TAKE 1 TABLET (10 MG TOTAL) BY MOUTH DAILY. FOR ALLERGIES   Cholecalciferol (VITAMIN D3 PO) Take by mouth daily.   citalopram (CELEXA) 40 MG tablet TAKE 1 TABLET BY MOUTH EVERY DAY   clonazePAM (KLONOPIN) 0.5 MG tablet Take 1 tablet (0.5 mg total) by mouth every 8 (eight) hours as needed. for anxiety   Cyanocobalamin (VITAMIN B-12 PO) Take by mouth daily.   fluticasone (FLONASE) 50 MCG/ACT nasal spray SPRAY 2 SPRAYS INTO EACH NOSTRIL EVERY DAY   HYDROcodone-acetaminophen (NORCO) 10-325 MG tablet Take 1 tablet by mouth every 6 (six) hours as needed.   metoprolol succinate (TOPROL-XL) 50 MG 24 hr tablet Take 1 tablet (50 mg total) by mouth every evening. Take with or immediately following a meal.   omeprazole (PRILOSEC) 40 MG capsule TAKE 1 CAPSULE (40 MG TOTAL) BY MOUTH DAILY.   pravastatin (PRAVACHOL) 40 MG tablet TAKE 1 TABLET BY MOUTH EVERY DAY   sildenafil (REVATIO) 20 MG tablet TAKE 1 TO 2 TABLETS BY MOUTH DAILY   spironolactone (ALDACTONE) 25 MG tablet TAKE 1 TABLET BY MOUTH EVERY  DAY   valsartan-hydrochlorothiazide (DIOVAN-HCT) 160-25 MG tablet TAKE 1 TABLET BY MOUTH DAILY. TAKE IN PLACE OF ATACAND-HCTZ   Vitamin E 268 MG (400 UNIT) CAPS Take by mouth.   cyclobenzaprine (FLEXERIL) 5 MG tablet Take 1 tablet (5 mg total) by mouth 3 (three) times daily as needed for muscle spasms.   No facility-administered medications prior to visit.    Review of Systems  Musculoskeletal:  Positive for gait problem and joint swelling. Negative for arthralgias.       Objective    BP 117/84 (BP Location: Left Arm, Patient Position: Sitting, Cuff Size: Normal)   Pulse 68   Temp 98.2 F (36.8 C) (Oral)   Wt 197 lb (89.4 kg)   SpO2 95%   BMI 28.67 kg/m    Physical Exam  MTP of right great toe moderately swollen and tender with faint redness. Trace edema of foot and ankle. Mild erythema dorsum of right foot and ankle.   Assessment & Plan     1. Toe pain, right  2. Swelling of right foot  Likely gout - Uric acid - colchicine 0.6 MG tablet; Take 2 tablets (1.2 mg) on first day of gout flare. Take 1 tablet (0.6mg ) 1 hour later. Then, take 1 tablet (0.6mg ) daily until resolved.  Dispense: 30 tablet; Refill: 1  Need to cover for potential for cellulitis -  cephALEXin (KEFLEX) 500 MG capsule; Take 1 capsule (500 mg total) by mouth 4 (four) times daily for 7 days.  Dispense: 28 capsule; Refill: 0  3. Essential (primary) hypertension Is well controlled, if uric acid levels are elevated we can reduce hctz  4. Hyperglycemia Lab Results  Component Value Date   GLUCOSE 116 (H) 01/27/2022   - Hemoglobin A1c      The entirety of the information documented in the History of Present Illness, Review of Systems and Physical Exam were personally obtained by me. Portions of this information were initially documented by the CMA and reviewed by me for thoroughness and accuracy.     Mila Merry, MD  [facility] [phone] (phone) [phone]  (fax)  [facility]

## 2022-04-28 LAB — HEMOGLOBIN A1C
Est. average glucose Bld gHb Est-mCnc: 120 mg/dL
Hgb A1c MFr Bld: 5.8 % — ABNORMAL HIGH (ref 4.8–5.6)

## 2022-04-28 LAB — URIC ACID: Uric Acid: 7 mg/dL (ref 3.8–8.4)

## 2022-04-29 ENCOUNTER — Telehealth: Payer: Self-pay

## 2022-04-29 NOTE — Telephone Encounter (Signed)
Pt call for lab results. Shared provider's note. No questions at this time.   Malva Limes, MD 04/29/2022 12:56 PM EDT Back to Top    Uric acid level is borderline high. His symptoms are probably due to gout, but I don't think uric acid level is high enough to require any medication changes. Let me know if not better with medications prescribed at his office visit.

## 2022-05-04 ENCOUNTER — Other Ambulatory Visit: Payer: Self-pay | Admitting: Family Medicine

## 2022-05-04 DIAGNOSIS — M79674 Pain in right toe(s): Secondary | ICD-10-CM

## 2022-05-04 NOTE — Progress Notes (Deleted)
Established patient visit   Patient: Gary Carter   DOB: 1950-08-15   72 y.o. Male  MRN: [id number] Visit Date: 05/05/2022  Today's healthcare provider: Mila Merry, MD   No chief complaint on file.  Subjective    HPI  Depression, Follow-up  He  was last seen for this 6 months ago. Changes made at last visit include changing Citalopram to Lexipro .   He reports {excellent/good/fair/poor:19665} compliance with treatment. He {is/is not:21021397} having side effects. ***  He reports {DESC; GOOD/FAIR/POOR:18685} tolerance of treatment. Current symptoms include: {Symptoms; depression:1002} He feels he is {improved/worse/unchanged:3041574} since last visit.     04/27/2022    8:37 AM 03/03/2022    8:58 AM 01/27/2022    8:19 AM  Depression screen PHQ 2/9  Decreased Interest 0 0 0  Down, Depressed, Hopeless 0 0 0  PHQ - 2 Score 0 0 0  Altered sleeping 0 0 0  Tired, decreased energy 0 0 0  Change in appetite 0 0 0  Feeling bad or failure about yourself  0 0 0  Trouble concentrating 0 0 0  Moving slowly or fidgety/restless 0 0 0  Suicidal thoughts 0 0 0  PHQ-9 Score 0 0 0  Difficult doing work/chores Not difficult at all Not difficult at all Not difficult at all    -----------------------------------------------------------------------------------------   Medications: Outpatient Medications Prior to Visit  Medication Sig   amLODipine (NORVASC) 10 MG tablet TAKE 1 TABLET BY MOUTH EVERY DAY   aspirin 325 MG tablet Take 325 mg by mouth daily.    cephALEXin (KEFLEX) 500 MG capsule Take 1 capsule (500 mg total) by mouth 4 (four) times daily for 7 days.   cetirizine (ZYRTEC) 10 MG tablet TAKE 1 TABLET (10 MG TOTAL) BY MOUTH DAILY. FOR ALLERGIES   Cholecalciferol (VITAMIN D3 PO) Take by mouth daily.   citalopram (CELEXA) 40 MG tablet TAKE 1 TABLET BY MOUTH EVERY DAY   clonazePAM (KLONOPIN) 0.5 MG tablet Take 1 tablet (0.5 mg total) by mouth every 8 (eight) hours as  needed. for anxiety   colchicine 0.6 MG tablet Take 2 tablets (1.2 mg) on first day of gout flare. Take 1 tablet (0.6mg ) 1 hour later. Then, take 1 tablet (0.6mg ) daily until resolved.   Cyanocobalamin (VITAMIN B-12 PO) Take by mouth daily.   cyclobenzaprine (FLEXERIL) 5 MG tablet Take 1 tablet (5 mg total) by mouth 3 (three) times daily as needed for muscle spasms.   fluticasone (FLONASE) 50 MCG/ACT nasal spray SPRAY 2 SPRAYS INTO EACH NOSTRIL EVERY DAY   HYDROcodone-acetaminophen (NORCO) 10-325 MG tablet Take 1 tablet by mouth every 6 (six) hours as needed.   metoprolol succinate (TOPROL-XL) 50 MG 24 hr tablet Take 1 tablet (50 mg total) by mouth every evening. Take with or immediately following a meal.   omeprazole (PRILOSEC) 40 MG capsule TAKE 1 CAPSULE (40 MG TOTAL) BY MOUTH DAILY.   pravastatin (PRAVACHOL) 40 MG tablet TAKE 1 TABLET BY MOUTH EVERY DAY   sildenafil (REVATIO) 20 MG tablet TAKE 1 TO 2 TABLETS BY MOUTH DAILY   spironolactone (ALDACTONE) 25 MG tablet TAKE 1 TABLET BY MOUTH EVERY DAY   valsartan-hydrochlorothiazide (DIOVAN-HCT) 160-25 MG tablet TAKE 1 TABLET BY MOUTH DAILY. TAKE IN PLACE OF ATACAND-HCTZ   Vitamin E 268 MG (400 UNIT) CAPS Take by mouth.   No facility-administered medications prior to visit.    Review of Systems  {Labs  Heme  Chem  Endocrine  Serology  Results Review (optional):23779}   Objective    There were no vitals taken for this visit. {Show previous vital signs (optional):23777}  Physical Exam  ***  No results found for any visits on 05/05/22.  Assessment & Plan     ***  No follow-ups on file.      {provider attestation***:1}   Mila Merry, MD  [facility] [phone] (phone) [phone] (fax)  [facility]

## 2022-05-05 ENCOUNTER — Ambulatory Visit: Payer: Medicare Other | Admitting: Family Medicine

## 2022-05-06 ENCOUNTER — Other Ambulatory Visit: Payer: Self-pay | Admitting: Family Medicine

## 2022-05-06 DIAGNOSIS — G8929 Other chronic pain: Secondary | ICD-10-CM

## 2022-05-06 MED ORDER — HYDROCODONE-ACETAMINOPHEN 10-325 MG PO TABS: 1.0000 | ORAL_TABLET | Freq: Four times a day (QID) | ORAL | 0 refills | Status: DC | PRN

## 2022-05-06 NOTE — Telephone Encounter (Signed)
Medication Refill - Medication: HYDROcodone-acetaminophen (NORCO) 10-325 MG tablet   Has the patient contacted their pharmacy? No.  Preferred Pharmacy (with phone number or street name):  [facility] [phone] [place], [place] - [address] Phone: [phone]  Fax: [phone]     Has the patient been seen for an appointment in the last year OR does the patient have an upcoming appointment? Yes.    Agent: Please be advised that RX refills may take up to 3 business days. We ask that you follow-up with your pharmacy.

## 2022-05-06 NOTE — Telephone Encounter (Signed)
Requested medications are due for refill today.  Provider to decide  Requested medications are on the active medications list.  yes  Last refill. 04/23/2022 #60 0 rf  Future visit scheduled.   no  Notes to clinic.  Refill not delegated.    Requested Prescriptions  Pending Prescriptions Disp Refills   HYDROcodone-acetaminophen (NORCO) 10-325 MG tablet 60 tablet 0    Sig: Take 1 tablet by mouth every 6 (six) hours as needed.     Not Delegated - Analgesics:  Opioid Agonist Combinations Failed - 05/06/2022  1:35 PM      Failed - This refill cannot be delegated      Failed - Urine Drug Screen completed in last 360 days      Passed - Valid encounter within last 3 months    Recent Outpatient Visits           1 week ago Toe pain, right   [facility] Malva Limes, MD   3 months ago Enteritis   [facility] [place], [place], PA-C   3 months ago Enteritis   [facility] Malva Limes, MD   6 months ago Depression, recurrent [facility])   [facility] Malva Limes, MD   7 months ago Hyperlipidemia, unspecified hyperlipidemia type   [facility] Malva Limes, MD

## 2022-05-07 ENCOUNTER — Other Ambulatory Visit: Payer: Self-pay | Admitting: Family Medicine

## 2022-05-19 ENCOUNTER — Other Ambulatory Visit: Payer: Self-pay | Admitting: Neurosurgery

## 2022-05-19 ENCOUNTER — Other Ambulatory Visit: Payer: Self-pay | Admitting: Family Medicine

## 2022-05-19 DIAGNOSIS — G4700 Insomnia, unspecified: Secondary | ICD-10-CM

## 2022-05-19 NOTE — Telephone Encounter (Signed)
Requested medication (s) are due for refill today: yes  Requested medication (s) are on the active medication list: yes  Last refill:  10/06/21  Future visit scheduled: no  Notes to clinic:  Unable to refill per protocol, cannot delegate.      Requested Prescriptions  Pending Prescriptions Disp Refills   clonazePAM (KLONOPIN) 0.5 MG tablet [Pharmacy Med Name: CLONAZEPAM 0.5 MG TABLET] 90 tablet     Sig: Take 1 tablet (0.5 mg total) by mouth every 8 (eight) hours as needed. for anxiety     Not Delegated - Psychiatry: Anxiolytics/Hypnotics 2 Failed - 05/19/2022  3:51 PM      Failed - This refill cannot be delegated      Failed - Urine Drug Screen completed in last 360 days      Passed - Patient is not pregnant      Passed - Valid encounter within last 6 months    Recent Outpatient Visits           3 weeks ago Toe pain, right   [facility] Malva Limes, MD   3 months ago Enteritis   [facility] [place], [place], PA-C   4 months ago Enteritis   [facility] Malva Limes, MD   6 months ago Depression, recurrent [facility]   [facility] Malva Limes, MD   7 months ago Hyperlipidemia, unspecified hyperlipidemia type   [facility] Malva Limes, MD

## 2022-05-21 ENCOUNTER — Other Ambulatory Visit: Payer: Self-pay | Admitting: Family Medicine

## 2022-05-21 DIAGNOSIS — G8929 Other chronic pain: Secondary | ICD-10-CM

## 2022-05-21 NOTE — Telephone Encounter (Signed)
Medication Refill - Medication: HYDROcodone-acetaminophen (NORCO) 10-325 MG tablet   Has the patient contacted their pharmacy? No.  Preferred Pharmacy (with phone number or street name):  [id number] [place] [place] - [address] Phone: [phone]  Fax: [phone]     Has the patient been seen for an appointment in the last year OR does the patient have an upcoming appointment? Yes.    Agent: Please be advised that RX refills may take up to 3 business days. We ask that you follow-up with your pharmacy.  

## 2022-05-21 NOTE — Telephone Encounter (Signed)
Requested medication (s) are due for refill today: yes  Requested medication (s) are on the active medication list: yes  Last refill:  05/06/22 #60  Future visit scheduled: no  Notes to clinic:  med not delegated to NT to RF   Requested Prescriptions  Pending Prescriptions Disp Refills   HYDROcodone-acetaminophen (NORCO) 10-325 MG tablet 60 tablet 0    Sig: Take 1 tablet by mouth every 6 (six) hours as needed.     Not Delegated - Analgesics:  Opioid Agonist Combinations Failed - 05/21/2022  3:03 PM      Failed - This refill cannot be delegated      Failed - Urine Drug Screen completed in last 360 days      Passed - Valid encounter within last 3 months    Recent Outpatient Visits           3 weeks ago Toe pain, right   [facility] Malva Limes, MD   3 months ago Enteritis   [facility] [place], [place], PA-C   4 months ago Enteritis   [facility] Malva Limes, MD   6 months ago Depression, recurrent [facility])    [facility] Malva Limes, MD   7 months ago Hyperlipidemia, unspecified hyperlipidemia type   [facility] Malva Limes, MD

## 2022-05-22 MED ORDER — HYDROCODONE-ACETAMINOPHEN 10-325 MG PO TABS: 1.0000 | ORAL_TABLET | Freq: Four times a day (QID) | ORAL | 0 refills | Status: DC | PRN

## 2022-06-04 ENCOUNTER — Other Ambulatory Visit: Payer: Self-pay | Admitting: Family Medicine

## 2022-06-04 DIAGNOSIS — G8929 Other chronic pain: Secondary | ICD-10-CM

## 2022-06-04 NOTE — Telephone Encounter (Signed)
Requested medications are due for refill today.  Provider to determine  Requested medications are on the active medications list.  yes  Last refill. 05/22/2022 #60 0 rf  Future visit scheduled.   no  Notes to clinic.  Refill not delegated.    Requested Prescriptions  Pending Prescriptions Disp Refills   HYDROcodone-acetaminophen (NORCO) 10-325 MG tablet 60 tablet 0    Sig: Take 1 tablet by mouth every 6 (six) hours as needed.     Not Delegated - Analgesics:  Opioid Agonist Combinations Failed - 06/04/2022  9:04 AM      Failed - This refill cannot be delegated      Failed - Urine Drug Screen completed in last 360 days      Passed - Valid encounter within last 3 months    Recent Outpatient Visits           1 month ago Toe pain, right   [facility] Malva Limes, MD   4 months ago Enteritis   [facility] [place], [place], PA-C   4 months ago Enteritis   [facility] Malva Limes, MD   7 months ago Depression, recurrent [facility])   [place] Malva Limes, MD   8 months ago Hyperlipidemia, unspecified hyperlipidemia type   [facility] Malva Limes, MD

## 2022-06-04 NOTE — Telephone Encounter (Signed)
Medication Refill - Medication: HYDROcodone-acetaminophen (NORCO) 10-325 MG tablet   Has the patient contacted their pharmacy? No. Patient stated he can't call pharmacy only the provider can send the refill  Preferred Pharmacy (with phone number or street name):  [id number], [place] - [address] Phone: [phone]  Fax: [phone]     Has the patient been seen for an appointment in the last year OR does the patient have an upcoming appointment? No.  Agent: Please be advised that RX refills may take up to 3 business days. We ask that you follow-up with your pharmacy.  Patient said he has a couple of pills left

## 2022-06-05 MED ORDER — HYDROCODONE-ACETAMINOPHEN 10-325 MG PO TABS
1.0000 | ORAL_TABLET | Freq: Four times a day (QID) | ORAL | 0 refills | Status: DC | PRN
Start: 2022-06-05 — End: 2022-06-21

## 2022-06-06 ENCOUNTER — Ambulatory Visit (INDEPENDENT_AMBULATORY_CARE_PROVIDER_SITE_OTHER): Payer: Medicare Other

## 2022-06-06 ENCOUNTER — Ambulatory Visit
Admission: EM | Admit: 2022-06-06 | Discharge: 2022-06-06 | Disposition: A | Discharge status: Home / Self Care | Payer: Medicare Other | Attending: Emergency Medicine | Admitting: Emergency Medicine

## 2022-06-06 ENCOUNTER — Encounter: Payer: Self-pay | Admitting: Emergency Medicine

## 2022-06-06 DIAGNOSIS — S20212A Contusion of left front wall of thorax, initial encounter: Secondary | ICD-10-CM | POA: Diagnosis not present

## 2022-06-06 DIAGNOSIS — R0781 Pleurodynia: Secondary | ICD-10-CM | POA: Diagnosis not present

## 2022-06-06 DIAGNOSIS — S298XXA Other specified injuries of thorax, initial encounter: Secondary | ICD-10-CM | POA: Diagnosis not present

## 2022-06-06 MED ORDER — IBUPROFEN 600 MG PO TABS: 600.0000 mg | ORAL_TABLET | Freq: Three times a day (TID) | ORAL | 0 refills | Status: AC | PRN

## 2022-06-06 NOTE — ED Triage Notes (Signed)
Patient states that he was raising his truck to change the tire and the jack slipped and the block fell on the left side of his chest.  Patient has large bruise on the left side of his chest.  Patient reports increase in pain when he takes a deep breath.

## 2022-06-06 NOTE — Discharge Instructions (Addendum)
-  Your chest x-ray is normal.  You have a very large bruise or contusion. - It is important that you ice the area every couple of hours for about 15 minutes at a time for the next few days.  May take your home pain medication as needed and I sent ibuprofen to the pharmacy. - If at any point you feel that your pain worsens or becomes difficult to breathe, you feel weak or dizzy then you should go to the emergency department for further workup.

## 2022-06-06 NOTE — ED Provider Notes (Signed)
[facility]    CSN: [id number] Arrival date & time: 06/06/22  1111      History   Chief Complaint Chief Complaint  Patient presents with   Chest Injury    HPI Gary Carter is a 72 y.o. male presenting for pain, swelling and bruising to the left chest and ribs.  Patient reports he was trying to change a flat tire on his vehicle.  He reports the jack slipped and the hitch of the trailer hit the left side of his ribs and chest.  He denies the weight of the tractor-trailer being on him, just the trailer hitch hitting him.  He reports a large bruise.  He says the area is very tender when touched and when he takes a deep breath.  He denies feeling short of breath.  He has been taking his home Norco for chronic pain but has not taken any anti-inflammatory medicine or applied ice.  He denies any other injuries.  HPI  Past Medical History:  Diagnosis Date   Anxiety    Barrett's esophagus    GERD (gastroesophageal reflux disease)    History of chicken pox 06/12/2014   DID have Chicken Pox. DID have Measles. DID have Mumps. DID have Rubella.     History of dysphagia    History of fatty infiltration of liver    History of measles    History of mumps    History of rubella    Hyperlipidemia    Hypertension    Sleep apnea    SOB (shortness of breath)     Patient Active Problem List   Diagnosis Date Noted   Strabismus 02/12/2021   Acute right-sided low back pain with right-sided sciatica 11/28/2020   Status post eye surgery 05/09/2020   Gastritis 07/20/2018   Fatty infiltration of liver 07/03/2018   Gastroesophageal reflux disease with hiatal hernia 07/03/2018   Esophageal dysphagia 07/03/2018   Chronic, continuous use of opioids 11/15/2017   History of kidney stones 05/04/2016   Bunion 06/12/2014   Insomnia 06/12/2014   Panic attack 06/12/2014   Obstructive apnea 06/12/2014   Anxiety disorder 06/11/2014   H/O suicide attempt 05/28/2010   Esophagitis, reflux  05/15/2008   Actinic keratoses 05/06/2008   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   Allergic rhinitis 03/08/2007   Arthralgia of hand 12/14/2006   HLD (hyperlipidemia) 08/15/2006   Family history of coronary arteriosclerosis 08/13/2006   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   Chronic low back pain 12/07/2005   Depression, recurrent (HCC) 12/05/2005   Barrett's esophagus without dysplasia 02/06/1997   Essential (primary) hypertension 02/06/1997   Current smoker 01/11/1958    Past Surgical History:  Procedure Laterality Date   ABDOMINAL SURGERY     BACK SURGERY  12-04-2003   Lumbar fusion & C5-C6 fusion   ESOPHAGOGASTRODUODENOSCOPY (EGD) WITH PROPOFOL N/A 07/10/2018   Procedure: ESOPHAGOGASTRODUODENOSCOPY (EGD) WITH PROPOFOL;  Surgeon: Scot Jun, MD;  Location: [facility];  Service: Endoscopy;  Laterality: N/A;   HERNIA REPAIR     Myocardial perfusion scan  10/10/2006   NECK SURGERY     NERVE, TENDON AND ARTERY REPAIR Left 10/18/2012   Procedure: NERVE, TENDON AND ARTERY REPAIR;  Surgeon: Sharma Covert, MD;  Location: [facility] OR;  Service: Orthopedics;  Laterality: Left;   NISSEN FUNDOPLICATION  1999   UPPER GI ENDOSCOPY  04/09/2007   Dr. Markham Jordan; Mucosal changes suspicious for Barretts esophagits   WOUND EXPLORATION Left 10/18/2012   Procedure: WOUND EXPLORATION;  Surgeon: Sharma Covert, MD;  Location: [facility] OR;  Service: Orthopedics;  Laterality: Left;       Home Medications    Prior to Admission medications   Medication Sig Start Date End Date Taking? Authorizing Provider  ibuprofen (ADVIL) 600 MG tablet Take 1 tablet (600 mg total) by mouth every 8 (eight) hours as needed for moderate pain. 06/06/22  Yes Eusebio Friendly B, PA-C  amLODipine (NORVASC) 10 MG tablet TAKE 1 TABLET BY MOUTH EVERY DAY 09/02/21   Malva Limes, MD  aspirin 325 MG tablet Take 325 mg by mouth daily.  05/13/08   [provider]  cetirizine (ZYRTEC) 10 MG tablet TAKE 1 TABLET  (10 MG TOTAL) BY MOUTH DAILY. FOR ALLERGIES 09/21/21   Malva Limes, MD  Cholecalciferol (VITAMIN D3 PO) Take by mouth daily.    [provider]  citalopram (CELEXA) 40 MG tablet TAKE 1 TABLET BY MOUTH EVERY DAY 09/02/21   Malva Limes, MD  clonazePAM (KLONOPIN) 0.5 MG tablet TAKE 1 TABLET (0.5 MG TOTAL) BY MOUTH EVERY 8 (EIGHT) HOURS AS NEEDED. FOR ANXIETY 05/20/22   Malva Limes, MD  colchicine 0.6 MG tablet TAKE 2 TABLETS ON FIRST DAY OF GOUT FLARE. 1 TAB 1 HOUR LATER. THEN, 1 TAB DAILY UNTIL RESOLVED. 05/05/22   Malva Limes, MD  Cyanocobalamin (VITAMIN B-12 PO) Take by mouth daily.    [provider]  cyclobenzaprine (FLEXERIL) 5 MG tablet Take 1 tablet (5 mg total) by mouth 3 (three) times daily as needed for muscle spasms. 02/24/21   Malva Limes, MD  fluticasone [facility]) 50 MCG/ACT nasal spray SPRAY 2 SPRAYS INTO EACH NOSTRIL EVERY DAY 09/17/21   Malva Limes, MD  HYDROcodone-acetaminophen (NORCO) 10-325 MG tablet Take 1 tablet by mouth every 6 (six) hours as needed. 06/05/22   Malva Limes, MD  metoprolol succinate (TOPROL-XL) 50 MG 24 hr tablet Take 1 tablet (50 mg total) by mouth every evening. Take with or immediately following a meal. 01/13/22 04/08/23  Malva Limes, MD  omeprazole (PRILOSEC) 40 MG capsule TAKE 1 CAPSULE (40 MG TOTAL) BY MOUTH DAILY. 09/02/21   Malva Limes, MD  pravastatin (PRAVACHOL) 40 MG tablet TAKE 1 TABLET BY MOUTH EVERY DAY 09/02/21   Malva Limes, MD  sildenafil (REVATIO) 20 MG tablet TAKE 1 TO 2 TABLETS BY MOUTH DAILY 10/22/21   Malva Limes, MD  spironolactone (ALDACTONE) 25 MG tablet TAKE 1 TABLET BY MOUTH EVERY DAY 05/07/22   Malva Limes, MD  valsartan-hydrochlorothiazide (DIOVAN-HCT) 160-25 MG tablet TAKE 1 TABLET BY MOUTH DAILY. TAKE IN PLACE OF ATACAND-HCTZ 09/02/21   Malva Limes, MD  Vitamin E 268 MG (400 UNIT) CAPS Take by mouth.    [provider]  POTASSIUM PO Take daily by mouth.   10/03/19  [provider]    Family History Family History  Problem Relation Age of Onset   Hypertension Sister    Bipolar disorder Sister    Heart attack Father 73   Emphysema Mother    Heart disease Mother    Diabetes Daughter 3   Bipolar disorder Brother    Hypertension Brother    Obesity Brother     Social History Social History   Tobacco Use   Smoking status: Every Day    Packs/day: 0.50    Years: 5.00    Additional pack years: 0.00    Total pack years: 2.50    Types: Cigarettes  Smokeless tobacco: Never  Vaping Use   Vaping Use: Never used  Substance Use Topics   Alcohol use: Yes    Alcohol/week: 8.0 standard drinks of alcohol    Types: 8 Shots of liquor per week    Comment: 2 drinks 4 nights a week   Drug use: Not Currently    Comment: previously smoked marijuana years ago     Allergies   Hctz [hydrochlorothiazide]   Review of Systems Review of Systems  Constitutional:  Negative for fatigue.  Respiratory:  Negative for cough, shortness of breath and wheezing.   Cardiovascular:  Positive for chest pain. Negative for palpitations.  Gastrointestinal:  Negative for abdominal pain.  Musculoskeletal:  Negative for back pain.  Skin:  Positive for color change. Negative for wound.  Neurological:  Negative for weakness.     Physical Exam Triage Vital Signs ED Triage Vitals  Enc Vitals Group     BP 06/06/22 1144 (!) 135/91     Pulse Rate 06/06/22 1144 64     Resp 06/06/22 1144 15     Temp 06/06/22 1144 98.2 F (36.8 C)     Temp Source 06/06/22 1144 Oral     SpO2 06/06/22 1144 94 %     Weight 06/06/22 1143 192 lb (87.1 kg)     Height 06/06/22 1143 5' 9.5" (1.765 m)     Head Circumference --      Peak Flow --      Pain Score 06/06/22 1143 10     Pain Loc --      Pain Edu? --      Excl. in GC? --    No data found.  Updated Vital Signs BP (!) 135/91 (BP Location: Right Arm)   Pulse 64   Temp 98.2 F (36.8 C) (Oral)   Resp 15   Ht  5' 9.5" (1.765 m)   Wt 192 lb (87.1 kg)   SpO2 94%   BMI 27.95 kg/m      Physical Exam Vitals and nursing note reviewed.  Constitutional:      General: He is not in acute distress.    Appearance: Normal appearance. He is well-developed. He is not ill-appearing.  HENT:     Head: Normocephalic and atraumatic.  Eyes:     General: No scleral icterus.    Conjunctiva/sclera: Conjunctivae normal.  Cardiovascular:     Rate and Rhythm: Normal rate and regular rhythm.     Heart sounds: Normal heart sounds.  Pulmonary:     Effort: Pulmonary effort is normal. No respiratory distress.     Breath sounds: Normal breath sounds.  Chest:     Chest wall: Tenderness (TTP left lower anterior/lateral ribs) present.  Abdominal:     Palpations: Abdomen is soft.     Tenderness: There is no abdominal tenderness.  Musculoskeletal:     Cervical back: Neck supple.  Skin:    General: Skin is warm and dry.     Capillary Refill: Capillary refill takes less than 2 seconds.     Findings: Bruising (large contusion to the left anterior/lateral ribs) present.  Neurological:     General: No focal deficit present.     Mental Status: He is alert. Mental status is at baseline.     Motor: No weakness.     Gait: Gait normal.  Psychiatric:        Mood and Affect: Mood normal.        Behavior: Behavior normal.  UC Treatments / Results  Labs (all labs ordered are listed, but only abnormal results are displayed) Labs Reviewed - No data to display  EKG   Radiology DG Ribs Unilateral W/Chest Left  Result Date: 06/06/2022 CLINICAL DATA:  Left pleuritic chest pain after injury yesterday. EXAM: LEFT RIBS AND CHEST - 3+ VIEW COMPARISON:  Chest x-ray dated February 21, 2013. FINDINGS: No fracture or other bone lesions are seen involving the ribs. There is no evidence of pneumothorax or pleural effusion. Both lungs are clear. Heart size and mediastinal contours are within normal limits. IMPRESSION: Negative.  Electronically Signed   By: Obie Dredge M.D.   On: 06/06/2022 12:19    Procedures Procedures (including critical care time)  Medications Ordered in UC Medications - No data to display  Initial Impression / Assessment and Plan / UC Course  I have reviewed the triage vital signs and the nursing notes.  Pertinent labs & imaging results that were available during my care of the patient were reviewed by me and considered in my medical decision making (see chart for details).   72 year old male presents for pain, swelling and contusion of the left chest/ribs since yesterday.  He reports he was changing a tire when the jack slipped and the trailer hitch hit him in the chest.  He denies feeling short of breath but has increased pain on breathing.  He does not take any anticoagulants.  His vitals are all stable.  He is overall well-appearing.  He is not in any acute distress.  On exam he does have a large contusion and swelling of the left anterior/lateral ribs/chest.  Tenderness in this region.  Chest is clear to auscultation heart regular rate and rhythm.  X-ray performed today of chest and ribs is normal.  Discussed result with patient.  Symptoms due to large contusion of chest.  Discussed the importance of applying ice frequently and trying the anti-inflammatory medication as well as continuing home medication for pain relief and making sure to take occasional deep breaths to prevent atelectasis and pneumonia.  Advised going to the emergency department if the chest pain worsens or he starts to feel dizzy, faint or have any shortness of breath.  Explained that he may need a CT to rule out more significant injury such as pulmonary embolism, etc.  Patient is understanding and agreeable.   Final Clinical Impressions(s) / UC Diagnoses   Final diagnoses:  Chest wall contusion, left, initial encounter  Blunt trauma to chest, initial encounter     Discharge Instructions      -Your chest x-ray  is normal.  You have a very large bruise or contusion. - It is important that you ice the area every couple of hours for about 15 minutes at a time for the next few days.  May take your home pain medication as needed and I sent ibuprofen to the pharmacy. - If at any point you feel that your pain worsens or becomes difficult to breathe, you feel weak or dizzy then you should go to the emergency department for further workup.     ED Prescriptions     Medication Sig Dispense Auth. Provider   ibuprofen (ADVIL) 600 MG tablet Take 1 tablet (600 mg total) by mouth every 8 (eight) hours as needed for moderate pain. 30 tablet Gareth Morgan      PDMP not reviewed this encounter.   Shirlee Latch, PA-C 06/06/22 1249

## 2022-06-21 ENCOUNTER — Other Ambulatory Visit: Payer: Self-pay | Admitting: Family Medicine

## 2022-06-21 DIAGNOSIS — G8929 Other chronic pain: Secondary | ICD-10-CM

## 2022-06-21 NOTE — Telephone Encounter (Signed)
Medication Refill - Medication: HYDROcodone-acetaminophen (NORCO) 10-325 MG tablet   Has the patient contacted their pharmacy? Yes.   (Agent: If no, request that the patient contact the pharmacy for the refill. If patient does not wish to contact the pharmacy document the reason why and proceed with request.) (Agent: If yes, when and what did the pharmacy advise?)  Preferred Pharmacy (with phone number or street name):  [id number] - [place] [place] - [address]  [address] [place] [address]  Phone: [phone] Fax: [phone]   Has the patient been seen for an appointment in the last year OR does the patient have an upcoming appointment? Yes.    Agent: Please be advised that RX refills may take up to 3 business days. We ask that you follow-up with your pharmacy.  

## 2022-06-22 ENCOUNTER — Other Ambulatory Visit: Payer: Self-pay | Admitting: Family Medicine

## 2022-06-22 NOTE — Telephone Encounter (Signed)
Requested medications are due for refill today.  yes  Requested medications are on the active medications list.  yes  Last refill. 06/05/2022 #60 0 rf  Future visit scheduled.   no  Notes to clinic.  Refill not delegated.    Requested Prescriptions  Pending Prescriptions Disp Refills   HYDROcodone-acetaminophen (NORCO) 10-325 MG tablet 60 tablet 0    Sig: Take 1 tablet by mouth every 6 (six) hours as needed.     Not Delegated - Analgesics:  Opioid Agonist Combinations Failed - 06/21/2022  8:59 AM      Failed - This refill cannot be delegated      Failed - Urine Drug Screen completed in last 360 days      Passed - Valid encounter within last 3 months    Recent Outpatient Visits           1 month ago Toe pain, right   [facility] Malva Limes, MD   4 months ago Enteritis   [facility] [place], [place], PA-C   5 months ago Enteritis   [facility] Malva Limes, MD   7 months ago Depression, recurrent [facility])   [place] [facility] Malva Limes, MD   8 months ago Hyperlipidemia, unspecified hyperlipidemia type   [facility] Malva Limes, MD

## 2022-06-23 MED ORDER — HYDROCODONE-ACETAMINOPHEN 10-325 MG PO TABS
1.0000 | ORAL_TABLET | Freq: Four times a day (QID) | ORAL | 0 refills | Status: DC | PRN
Start: 2022-06-23 — End: 2022-07-05

## 2022-07-05 ENCOUNTER — Other Ambulatory Visit: Payer: Self-pay | Admitting: Family Medicine

## 2022-07-05 DIAGNOSIS — G8929 Other chronic pain: Secondary | ICD-10-CM

## 2022-07-05 NOTE — Telephone Encounter (Signed)
Medication Refill - Medication: HYDROcodone-acetaminophen (NORCO) 10-325 MG tablet   Has the patient contacted their pharmacy? No.  Preferred Pharmacy (with phone number or street name):  [id number] - [place], [place] - [address] Phone: [phone]  Fax: [phone]     Has the patient been seen for an appointment in the last year OR does the patient have an upcoming appointment? Yes.    Agent: Please be advised that RX refills may take up to 3 business days. We ask that you follow-up with your pharmacy.  

## 2022-07-06 MED ORDER — HYDROCODONE-ACETAMINOPHEN 10-325 MG PO TABS
1.0000 | ORAL_TABLET | Freq: Four times a day (QID) | ORAL | 0 refills | Status: AC | PRN
Start: 2022-07-08 — End: ?

## 2022-07-06 NOTE — Telephone Encounter (Signed)
Requested medication (s) are due for refill today - provider review   Requested medication (s) are on the active medication list -yes  Future visit scheduled -no  Last refill: 06/23/22 #60  Notes to clinic: non delegated Rx  Requested Prescriptions  Pending Prescriptions Disp Refills   HYDROcodone-acetaminophen (NORCO) 10-325 MG tablet 60 tablet 0    Sig: Take 1 tablet by mouth every 6 (six) hours as needed.     Not Delegated - Analgesics:  Opioid Agonist Combinations Failed - 07/05/2022 10:15 AM      Failed - This refill cannot be delegated      Failed - Urine Drug Screen completed in last 360 days      Passed - Valid encounter within last 3 months    Recent Outpatient Visits           2 months ago Toe pain, right   [facility] Malva Limes, MD   5 months ago Enteritis   [facility] [place], [place], PA-C   5 months ago Enteritis   [facility] Malva Limes, MD   8 months ago Depression, recurrent [facility])   [place] [facility] Malva Limes, MD   9 months ago Hyperlipidemia, unspecified hyperlipidemia type   [name] Malva Limes, MD                 Requested Prescriptions  Pending Prescriptions Disp Refills   HYDROcodone-acetaminophen (NORCO) 10-325 MG tablet 60 tablet 0    Sig: Take 1 tablet by mouth every 6 (six) hours as needed.     Not Delegated - Analgesics:  Opioid Agonist Combinations Failed - 07/05/2022 10:15 AM      Failed - This refill cannot be delegated      Failed - Urine Drug Screen completed in last 360 days      Passed - Valid encounter within last 3 months    Recent Outpatient Visits           2 months ago Toe pain, right   [facility] Malva Limes, MD   5 months ago Enteritis   [facility] [place], [place], PA-C   5 months ago Enteritis   [facility] Malva Limes, MD   8 months ago Depression, recurrent [facility])   [place] [facility] Malva Limes, MD   9 months ago Hyperlipidemia, unspecified hyperlipidemia type   [facility] Malva Limes, MD

## 2022-07-06 NOTE — Telephone Encounter (Signed)
Pt called in checking of status of med, Hydrochodone, I let him know of 48-72 hr turn around

## 2022-07-21 ENCOUNTER — Other Ambulatory Visit: Payer: Self-pay | Admitting: Family Medicine

## 2022-07-21 DIAGNOSIS — G8929 Other chronic pain: Secondary | ICD-10-CM

## 2022-07-21 NOTE — Telephone Encounter (Signed)
Medication Refill - Medication: HYDROcodone-acetaminophen (NORCO) 10-325 MG tablet   Has the patient contacted their pharmacy? No. (Agent: If no, request that the patient contact the pharmacy for the refill. If patient does not wish to contact the pharmacy document the reason why and proceed with request.) (Agent: If yes, when and what did the pharmacy advise?)  Preferred Pharmacy (with phone number or street name):  [id number] - [place], [place] - [address] Phone: [phone]  Fax: [phone]     Has the patient been seen for an appointment in the last year OR does the patient have an upcoming appointment? Yes.    Agent: Please be advised that RX refills may take up to 3 business days. We ask that you follow-up with your pharmacy.  

## 2022-07-22 MED ORDER — HYDROCODONE-ACETAMINOPHEN 10-325 MG PO TABS
1.0000 | ORAL_TABLET | Freq: Four times a day (QID) | ORAL | 0 refills | Status: DC | PRN
Start: 2022-07-22 — End: 2022-08-05

## 2022-07-22 NOTE — Telephone Encounter (Signed)
Requested medications are due for refill today. 14 days since last refill  Requested medications are on the active medications list.  yes  Last refill. 07/08/2022 #60 no  Future visit scheduled.   no  Notes to clinic.  Refill not delegated.    Requested Prescriptions  Pending Prescriptions Disp Refills   HYDROcodone-acetaminophen (NORCO) 10-325 MG tablet 60 tablet 0    Sig: Take 1 tablet by mouth every 6 (six) hours as needed.     Not Delegated - Analgesics:  Opioid Agonist Combinations Failed - 07/21/2022 11:46 AM      Failed - This refill cannot be delegated      Failed - Urine Drug Screen completed in last 360 days      Passed - Valid encounter within last 3 months    Recent Outpatient Visits           2 months ago Toe pain, right   [facility] Malva Limes, MD   5 months ago Enteritis   [facility] [place], [place], PA-C   6 months ago Enteritis   [facility] Malva Limes, MD   8 months ago Depression, recurrent [facility])   [place] [facility] Malva Limes, MD   9 months ago Hyperlipidemia, unspecified hyperlipidemia type   [facility] Malva Limes, MD

## 2022-08-02 DIAGNOSIS — M898X9 Other specified disorders of bone, unspecified site: Secondary | ICD-10-CM | POA: Diagnosis not present

## 2022-08-02 DIAGNOSIS — M205X1 Other deformities of toe(s) (acquired), right foot: Secondary | ICD-10-CM | POA: Diagnosis not present

## 2022-08-02 DIAGNOSIS — M79671 Pain in right foot: Secondary | ICD-10-CM | POA: Diagnosis not present

## 2022-08-05 ENCOUNTER — Other Ambulatory Visit: Payer: Self-pay | Admitting: Family Medicine

## 2022-08-05 DIAGNOSIS — G8929 Other chronic pain: Secondary | ICD-10-CM

## 2022-08-05 NOTE — Telephone Encounter (Signed)
Medication Refill - Medication: HYDROcodone-acetaminophen (NORCO) 10-325 MG tablet   Has the patient contacted their pharmacy? No. (Agent: If no, request that the patient contact the pharmacy for the refill. If patient does not wish to contact the pharmacy document the reason why and proceed with request.) (Agent: If yes, when and what did the pharmacy advise?)  Preferred Pharmacy (with phone number or street name[facility]  [id number] [place]: [phone] Fax: [phone]    Has the patient been seen for an appointment in the last year OR does the patient have an upcoming appointment? Yes.    Agent: Please be advised that RX refills may take up to 3 business days. We ask that you follow-up with your pharmacy.

## 2022-08-05 NOTE — Telephone Encounter (Signed)
Requested medication (s) are due for refill today -provider review   Requested medication (s) are on the active medication list -yes  Future visit scheduled -no  Last refill: 07/22/22  Notes to clinic: non delegated Rx  Requested Prescriptions  Pending Prescriptions Disp Refills   HYDROcodone-acetaminophen (NORCO) 10-325 MG tablet 60 tablet 0    Sig: Take 1 tablet by mouth every 6 (six) hours as needed.     Not Delegated - Analgesics:  Opioid Agonist Combinations Failed - 08/05/2022  9:41 AM      Failed - This refill cannot be delegated      Failed - Urine Drug Screen completed in last 360 days      Failed - Valid encounter within last 3 months    Recent Outpatient Visits           3 months ago Toe pain, right   [facility] Malva Limes, MD   6 months ago Enteritis   [facility] [place], [place], PA-C   6 months ago Enteritis   [facility] Malva Limes, MD   9 months ago Depression, recurrent [facility])   [facility] Malva Limes, MD   10 months ago Hyperlipidemia, unspecified hyperlipidemia type   [facility] Malva Limes, MD                 Requested Prescriptions  Pending Prescriptions Disp Refills   HYDROcodone-acetaminophen (NORCO) 10-325 MG tablet 60 tablet 0    Sig: Take 1 tablet by mouth every 6 (six) hours as needed.     Not Delegated - Analgesics:  Opioid Agonist Combinations Failed - 08/05/2022  9:41 AM      Failed - This refill cannot be delegated      Failed - Urine Drug Screen completed in last 360 days      Failed - Valid encounter within last 3 months    Recent Outpatient Visits           3 months ago Toe pain, right   [facility] Malva Limes, MD   6 months ago Enteritis   [facility] [place], [place], PA-C   6 months ago Enteritis   [facility] Malva Limes, MD   9 months ago Depression, recurrent [facility])   [place] [facility] Malva Limes, MD   10 months ago Hyperlipidemia, unspecified hyperlipidemia type   [facility] Malva Limes, MD

## 2022-08-06 MED ORDER — HYDROCODONE-ACETAMINOPHEN 10-325 MG PO TABS
1.0000 | ORAL_TABLET | Freq: Four times a day (QID) | ORAL | 0 refills | Status: DC | PRN
Start: 2022-08-06 — End: 2022-08-20

## 2022-08-20 ENCOUNTER — Other Ambulatory Visit: Payer: Self-pay | Admitting: Family Medicine

## 2022-08-20 DIAGNOSIS — G8929 Other chronic pain: Secondary | ICD-10-CM

## 2022-08-20 MED ORDER — HYDROCODONE-ACETAMINOPHEN 10-325 MG PO TABS
1.0000 | ORAL_TABLET | Freq: Four times a day (QID) | ORAL | 0 refills | Status: DC | PRN
Start: 2022-08-20 — End: 2022-09-02

## 2022-08-20 NOTE — Telephone Encounter (Signed)
Requested medication (s) are due for refill today- provider review   Requested medication (s) are on the active medication list -yes  Future visit scheduled -no  Last refill: 08/06/22 #60  Notes to clinic: non delegated Rx  Requested Prescriptions  Pending Prescriptions Disp Refills   HYDROcodone-acetaminophen (NORCO) 10-325 MG tablet 60 tablet 0    Sig: Take 1 tablet by mouth every 6 (six) hours as needed.     Not Delegated - Analgesics:  Opioid Agonist Combinations Failed - 08/20/2022  8:52 AM      Failed - This refill cannot be delegated      Failed - Urine Drug Screen completed in last 360 days      Failed - Valid encounter within last 3 months    Recent Outpatient Visits           3 months ago Toe pain, right   [facility] Malva Limes, MD   6 months ago Enteritis   [facility] [place], [place], PA-C   7 months ago Enteritis   [facility] Malva Limes, MD   9 months ago Depression, recurrent [facility])   [place] [facility] Malva Limes, MD   10 months ago Hyperlipidemia, unspecified hyperlipidemia type   [facility] Malva Limes, MD                 Requested Prescriptions  Pending Prescriptions Disp Refills   HYDROcodone-acetaminophen (NORCO) 10-325 MG tablet 60 tablet 0    Sig: Take 1 tablet by mouth every 6 (six) hours as needed.     Not Delegated - Analgesics:  Opioid Agonist Combinations Failed - 08/20/2022  8:52 AM      Failed - This refill cannot be delegated      Failed - Urine Drug Screen completed in last 360 days      Failed - Valid encounter within last 3 months    Recent Outpatient Visits           3 months ago Toe pain, right   [facility] Malva Limes, MD   6 months ago Enteritis   [facility] [place], [address], PA-C   7 months ago Enteritis   [facility] Malva Limes, MD   9 months ago Depression, recurrent [facility])   [place] [facility] Malva Limes, MD   10 months ago Hyperlipidemia, unspecified hyperlipidemia type   [facility] Malva Limes, MD

## 2022-08-20 NOTE — Telephone Encounter (Signed)
Medication Refill - Medication: HYDROcodone-acetaminophen (NORCO) 10-325 MG tablet Pt states that he has two pills left.   Has the patient contacted their pharmacy? No.  Preferred Pharmacy (with phone number or street name):  [phone] [place], [place] - [address]  Phone: [phone] Fax: [phone]  Has the patient been seen for an appointment in the last year OR does the patient have an upcoming appointment? Yes.    Agent: Please be advised that RX refills may take up to 3 business days. We ask that you follow-up with your pharmacy.

## 2022-09-02 ENCOUNTER — Other Ambulatory Visit: Payer: Self-pay | Admitting: Family Medicine

## 2022-09-02 DIAGNOSIS — G8929 Other chronic pain: Secondary | ICD-10-CM

## 2022-09-02 NOTE — Telephone Encounter (Signed)
Medication Refill - Medication: HYDROcodone-acetaminophen (NORCO) 10-325 MG tablet     Has the patient contacted their pharmacy? No, patient states he has been advised to contact PCP office because its a controlled substance. Patient was told to call 3 days prior   Preferred Pharmacy (with phone number or street name):  [id number], [place] - [address] Phone: [phone]  Fax: [phone]      Has the patient been seen for an appointment in the last year OR does the patient have an upcoming appointment? Yes.    Agent: Please be advised that RX refills may take up to 3 business days. We ask that you follow-up with your pharmacy.

## 2022-09-03 ENCOUNTER — Other Ambulatory Visit: Payer: Self-pay | Admitting: Family Medicine

## 2022-09-03 MED ORDER — HYDROCODONE-ACETAMINOPHEN 10-325 MG PO TABS
1.0000 | ORAL_TABLET | Freq: Four times a day (QID) | ORAL | 0 refills | Status: DC | PRN
Start: 2022-09-03 — End: 2022-09-20

## 2022-09-03 NOTE — Telephone Encounter (Signed)
Requested medication (s) are due for refill today: routing for review  Requested medication (s) are on the active medication list: yes  Last refill:  08/20/22  Future visit scheduled: no  Notes to clinic:  Unable to refill per protocol, cannot delegate.      Requested Prescriptions  Pending Prescriptions Disp Refills   HYDROcodone-acetaminophen (NORCO) 10-325 MG tablet 60 tablet 0    Sig: Take 1 tablet by mouth every 6 (six) hours as needed.     Not Delegated - Analgesics:  Opioid Agonist Combinations Failed - 09/02/2022  3:52 PM      Failed - This refill cannot be delegated      Failed - Urine Drug Screen completed in last 360 days      Failed - Valid encounter within last 3 months    Recent Outpatient Visits           4 months ago Toe pain, right   [facility] Malva Limes, MD   7 months ago Enteritis   [facility] [place], [place], PA-C   7 months ago Enteritis   [facility] Malva Limes, MD   10 months ago Depression, recurrent [facility])   [facility] Malva Limes, MD   11 months ago Hyperlipidemia, unspecified hyperlipidemia type   [facility] Malva Limes, MD

## 2022-09-06 NOTE — Telephone Encounter (Signed)
Requested Prescriptions  Pending Prescriptions Disp Refills   omeprazole (PRILOSEC) 40 MG capsule [Pharmacy Med Name: OMEPRAZOLE DR 40 MG CAPSULE] 90 capsule 0    Sig: TAKE 1 CAPSULE (40 MG TOTAL) BY MOUTH DAILY.     Gastroenterology: Proton Pump Inhibitors Passed - 09/03/2022  2:30 PM      Passed - Valid encounter within last 12 months    Recent Outpatient Visits           4 months ago Toe pain, right   [facility] Sherrie Mustache, Demetrios Isaacs, MD   7 months ago Enteritis   [facility] [place], [place], PA-C   7 months ago Enteritis   [facility] Malva Limes, MD   10 months ago Depression, recurrent [facility])   [facility] Malva Limes, MD   11 months ago Hyperlipidemia, unspecified hyperlipidemia type   [facility] Malva Limes, MD               citalopram (CELEXA) 40 MG tablet [Pharmacy Med Name: CITALOPRAM HBR 40 MG TABLET] 90 tablet 0    Sig: TAKE 1 TABLET BY MOUTH EVERY DAY     Psychiatry:  Antidepressants - SSRI Passed - 09/03/2022  2:30 PM      Passed - Completed PHQ-2 or PHQ-9 in the last 360 days      Passed - Valid encounter within last 6 months    Recent Outpatient Visits           4 months ago Toe pain, right   [facility] Malva Limes, MD   7 months ago Enteritis   [facility] [place], [place], PA-C   7 months ago Enteritis   [facility] Malva Limes, MD   10 months ago Depression, recurrent [facility])   [facility] Malva Limes, MD   11 months ago Hyperlipidemia, unspecified hyperlipidemia type   [facility] Malva Limes, MD               amLODipine (NORVASC) 10 MG tablet [Pharmacy Med Name: AMLODIPINE BESYLATE 10 MG TAB] 90 tablet 0    Sig: TAKE 1 TABLET BY MOUTH EVERY DAY      Cardiovascular: Calcium Channel Blockers 2 Failed - 09/03/2022  2:30 PM      Failed - Last BP in normal range    BP Readings from Last 1 Encounters:  06/06/22 (!) 135/91         Passed - Last Heart Rate in normal range    Pulse Readings from Last 1 Encounters:  06/06/22 64         Passed - Valid encounter within last 6 months    Recent Outpatient Visits           4 months ago Toe pain, right   [facility] Malva Limes, MD   7 months ago Enteritis   [facility] [place], [place], PA-C   7 months ago Enteritis   [facility] Malva Limes, MD   10 months ago Depression, recurrent [facility])   [facility] Malva Limes, MD   11 months ago Hyperlipidemia, unspecified hyperlipidemia type   [facility] Malva Limes, MD  valsartan-hydrochlorothiazide (DIOVAN-HCT) 160-25 MG tablet [Pharmacy Med Name: VALSARTAN-HCTZ 160-25 MG TAB] 90 tablet 0    Sig: TAKE 1 TABLET BY MOUTH DAILY. TAKE IN PLACE OF ATACAND-HCTZ     Cardiovascular: ARB + Diuretic Combos Failed - 09/03/2022  2:30 PM      Failed - K in normal range and within 180 days    Potassium  Date Value Ref Range Status  01/27/2022 4.9 3.5 - 5.2 mmol/L Final         Failed - Na in normal range and within 180 days    Sodium  Date Value Ref Range Status  01/27/2022 140 134 - 144 mmol/L Final         Failed - Cr in normal range and within 180 days    Creatinine, Ser  Date Value Ref Range Status  01/27/2022 0.97 0.76 - 1.27 mg/dL Final         Failed - eGFR is 10 or above and within 180 days    GFR calc Af Amer  Date Value Ref Range Status  05/18/2019 107 >59 mL/min/1.73 Final    Comment:    **[facility] currently reports eGFR in compliance with the current**   recommendations of the [employer]. [facility] will   update reporting as new guidelines are  published from the NKF-ASN   Task force.    GFR, Estimated  Date Value Ref Range Status  12/26/2021 >60 >60 mL/min Final    Comment:    (NOTE) Calculated using the CKD-EPI Creatinine Equation (2021)    eGFR  Date Value Ref Range Status  01/27/2022 85 >59 mL/min/1.73 Final         Failed - Last BP in normal range    BP Readings from Last 1 Encounters:  06/06/22 (!) 135/91         Passed - Patient is not pregnant      Passed - Valid encounter within last 6 months    Recent Outpatient Visits           4 months ago Toe pain, right   [facility] Sherrie Mustache, Demetrios Isaacs, MD   7 months ago Enteritis   [facility] [place], [place], PA-C   7 months ago Enteritis   [facility] Malva Limes, MD   10 months ago Depression, recurrent [facility])   [facility] Malva Limes, MD   11 months ago Hyperlipidemia, unspecified hyperlipidemia type   [facility] Malva Limes, MD

## 2022-09-09 ENCOUNTER — Ambulatory Visit: Payer: Self-pay | Admitting: *Deleted

## 2022-09-09 NOTE — Telephone Encounter (Signed)
  Reason for Disposition  Age > 60 years  Answer Assessment - Initial Assessment Questions 1. LOCATION: "Where does it hurt?"      I'm having pain in right upper abd.   I can touch it and it's sore.   There isn't a knot.   It aches.    Sometimes I have diarrhea.    It doesn't affect the pain when I have a BM. I've a stomach operation.    I've got a mesh in my abd.   I don't remember where they put it.    2. RADIATION: "Does the pain shoot anywhere else?" (e.g., chest, back)     No 3. ONSET: "When did the pain begin?" (Minutes, hours or days ago)      A couple of weeks  4. SUDDEN: "Gradual or sudden onset?"     Not asked 5. PATTERN "Does the pain come and go, or is it constant?"    - If it comes and goes: "How long does it last?" "Do you have pain now?"     (Note: Comes and goes means the pain is intermittent. It goes away completely between bouts.)    - If constant: "Is it getting better, staying the same, or getting worse?"      (Note: Constant means the pain never goes away completely; most serious pain is constant and gets worse.)      It's intermittent and when I touch it.    After I eat I can feel it more. 6. SEVERITY: "How bad is the pain?"  (e.g., Scale 1-10; mild, moderate, or severe)    - MILD (1-3): Doesn't interfere with normal activities, abdomen soft and not tender to touch.     - MODERATE (4-7): Interferes with normal activities or awakens from sleep, abdomen tender to touch.     - SEVERE (8-10): Excruciating pain, doubled over, unable to do any normal activities.       Moderate pain 7. RECURRENT SYMPTOM: "Have you ever had this type of stomach pain before?" If Yes, ask: "When was the last time?" and "What happened that time?"      No 8. CAUSE: "What do you think is causing the stomach pain?"     I don't know 9. RELIEVING/AGGRAVATING FACTORS: "What makes it better or worse?" (e.g., antacids, bending or twisting motion, bowel movement)     I take omeprazole every day 10.  OTHER SYMPTOMS: "Do you have any other symptoms?" (e.g., back pain, diarrhea, fever, urination pain, vomiting)       Vomiting no.  Protocols used: Abdominal Pain - Male-A-AH

## 2022-09-09 NOTE — Telephone Encounter (Signed)
  Chief Complaint: RUQ abd pain for 2 weeks Symptoms: Pain when he touches upper abd area and feels pain after eating in same spot Frequency: For the last 2 weeks Pertinent Negatives: Patient denies vomiting.  Disposition: [] ED /[] Urgent Care (no appt availability in office) / [x] Appointment(In office/virtual)/ []  [place] [facility]/ [] Home Care/ [] Refused Recommended Disposition /[] [place] [facility]/ []  Follow-up with PCP Additional Notes: Appt made with Debera Lat, PA-C for 09/10/2022 at 8:20.

## 2022-09-10 ENCOUNTER — Ambulatory Visit (INDEPENDENT_AMBULATORY_CARE_PROVIDER_SITE_OTHER): Payer: Medicare Other | Admitting: Physician Assistant

## 2022-09-10 ENCOUNTER — Encounter: Payer: Self-pay | Admitting: Physician Assistant

## 2022-09-10 VITALS — BP 135/93 | HR 66 | Ht 69.0 in | Wt 203.8 lb

## 2022-09-10 DIAGNOSIS — R1011 Right upper quadrant pain: Secondary | ICD-10-CM | POA: Diagnosis not present

## 2022-09-10 DIAGNOSIS — Z23 Encounter for immunization: Secondary | ICD-10-CM

## 2022-09-10 DIAGNOSIS — R109 Unspecified abdominal pain: Secondary | ICD-10-CM | POA: Diagnosis not present

## 2022-09-10 LAB — POCT URINALYSIS DIPSTICK
Bilirubin, UA: NEGATIVE
Glucose, UA: NEGATIVE
Ketones, UA: NEGATIVE
Nitrite, UA: NEGATIVE
Protein, UA: NEGATIVE
Spec Grav, UA: 1.025 (ref 1.010–1.025)
Urobilinogen, UA: 0.2 E.U./dL
pH, UA: 5 (ref 5.0–8.0)

## 2022-09-10 NOTE — Progress Notes (Signed)
Established patient visit  Patient: Gary Carter   DOB: [birth year]   72 y.o. Male  MRN: [id number] Visit Date: 09/10/2022  Today's healthcare provider: Debera Lat, PA-C   Chief Complaint  Patient presents with   Abdominal Pain    RUQ onset for 1 month, getting worst   Subjective     Discussed the use of AI scribe software for clinical note transcription with the patient, who gave verbal consent to proceed.  History of Present Illness   The patient presents with worsening right upper quadrant pain, described as a 'burning' and 'aching' sensation. The pain is not associated with food intake and can occur while at rest. He denies any associated nausea, vomiting, diarrhea, constipation, dark urine, or pale stools. He has not noticed any jaundice. He has a history of hernia repair with mesh placement.  He also reports a history of acid reflux, for which he takes omeprazole. The pain can sometimes reach a severity of 5 out of 10 and can last for about an hour. He denies any leg swelling. He has a history of one kidney stone. He also reports daily alcohol consumption, including beer and liquor.           09/10/2022    8:25 AM 04/27/2022    8:37 AM 03/03/2022    8:58 AM  Depression screen PHQ 2/9  Decreased Interest 0 0 0  Down, Depressed, Hopeless 0 0 0  PHQ - 2 Score 0 0 0  Altered sleeping 0 0 0  Tired, decreased energy 0 0 0  Change in appetite 0 0 0  Feeling bad or failure about yourself  0 0 0  Trouble concentrating 0 0 0  Moving slowly or fidgety/restless 0 0 0  Suicidal thoughts 0 0 0  PHQ-9 Score 0 0 0  Difficult doing work/chores Not difficult at all Not difficult at all Not difficult at all      09/10/2022    8:25 AM 10/30/2021   11:39 AM  GAD 7 : Generalized Anxiety Score  Nervous, Anxious, on Edge 0 2  Control/stop worrying 0 3  Worry too much - different things 0 3  Trouble relaxing 0 2  Restless 0 0  Easily annoyed or irritable 0 0  Afraid - awful might  happen 0 0  Total GAD 7 Score 0 10  Anxiety Difficulty Not difficult at all Somewhat difficult    Medications: Outpatient Medications Prior to Visit  Medication Sig   amLODipine (NORVASC) 10 MG tablet TAKE 1 TABLET BY MOUTH EVERY DAY   aspirin 325 MG tablet Take 325 mg by mouth daily.    cetirizine (ZYRTEC) 10 MG tablet TAKE 1 TABLET (10 MG TOTAL) BY MOUTH DAILY. FOR ALLERGIES   Cholecalciferol (VITAMIN D3 PO) Take by mouth daily.   citalopram (CELEXA) 40 MG tablet TAKE 1 TABLET BY MOUTH EVERY DAY   clonazePAM (KLONOPIN) 0.5 MG tablet TAKE 1 TABLET (0.5 MG TOTAL) BY MOUTH EVERY 8 (EIGHT) HOURS AS NEEDED. FOR ANXIETY   colchicine 0.6 MG tablet TAKE 2 TABLETS ON FIRST DAY OF GOUT FLARE. 1 TAB 1 HOUR LATER. THEN, 1 TAB DAILY UNTIL RESOLVED.   Cyanocobalamin (VITAMIN B-12 PO) Take by mouth daily.   cyclobenzaprine (FLEXERIL) 5 MG tablet Take 1 tablet (5 mg total) by mouth 3 (three) times daily as needed for muscle spasms.   fluticasone (FLONASE) 50 MCG/ACT nasal spray SPRAY 2 SPRAYS INTO EACH NOSTRIL EVERY DAY   HYDROcodone-acetaminophen (NORCO) 10-325 MG tablet  Take 1 tablet by mouth every 6 (six) hours as needed.   ibuprofen (ADVIL) 600 MG tablet Take 1 tablet (600 mg total) by mouth every 8 (eight) hours as needed for moderate pain.   metoprolol succinate (TOPROL-XL) 50 MG 24 hr tablet Take 1 tablet (50 mg total) by mouth every evening. Take with or immediately following a meal.   omeprazole (PRILOSEC) 40 MG capsule TAKE 1 CAPSULE (40 MG TOTAL) BY MOUTH DAILY.   pravastatin (PRAVACHOL) 40 MG tablet TAKE 1 TABLET BY MOUTH EVERY DAY   sildenafil (REVATIO) 20 MG tablet TAKE 1 TO 2 TABLETS BY MOUTH DAILY   spironolactone (ALDACTONE) 25 MG tablet TAKE 1 TABLET BY MOUTH EVERY DAY   valsartan-hydrochlorothiazide (DIOVAN-HCT) 160-25 MG tablet TAKE 1 TABLET BY MOUTH DAILY. TAKE IN PLACE OF ATACAND-HCTZ   Vitamin E 268 MG (400 UNIT) CAPS Take by mouth.   No facility-administered medications prior to  visit.    Review of Systems  All other systems reviewed and are negative.  Except see HPI       Objective    BP (!) 135/93   Pulse 66   Ht 5\' 9"  (1.753 m)   Wt 203 lb 12.8 oz (92.4 kg)   SpO2 96%   BMI 30.10 kg/m     Physical Exam Vitals reviewed.  Constitutional:      General: He is not in acute distress.    Appearance: Normal appearance. He is not diaphoretic.  HENT:     Head: Normocephalic and atraumatic.  Eyes:     General: No scleral icterus.    Conjunctiva/sclera: Conjunctivae normal.  Cardiovascular:     Rate and Rhythm: Normal rate and regular rhythm.     Pulses: Normal pulses.     Heart sounds: Normal heart sounds. No murmur heard. Pulmonary:     Effort: Pulmonary effort is normal. No respiratory distress.     Breath sounds: Normal breath sounds. No wheezing or rhonchi.  Musculoskeletal:     Cervical back: Neck supple.     Right lower leg: No edema.     Left lower leg: No edema.  Lymphadenopathy:     Cervical: No cervical adenopathy.  Skin:    General: Skin is warm and dry.     Findings: No rash.  Neurological:     Mental Status: He is alert and oriented to person, place, and time. Mental status is at baseline.  Psychiatric:        Mood and Affect: Mood normal.        Behavior: Behavior normal.      Results for orders placed or performed in visit on 09/10/22  POCT urinalysis dipstick  Result Value Ref Range   Color, UA Yellow    Clarity, UA Cloudy    Glucose, UA Negative Negative   Bilirubin, UA Negative    Ketones, UA Negative    Spec Grav, UA 1.025 1.010 - 1.025   Blood, UA Small    pH, UA 5.0 5.0 - 8.0   Protein, UA Negative Negative   Urobilinogen, UA 0.2 0.2 or 1.0 E.U./dL   Nitrite, UA Negative    Leukocytes, UA Trace (A) Negative   Appearance Yellow    Odor None     Assessment & Plan      Right Upper Quadrant Pain X 1 mo Patient reports intermittent right upper quadrant pain, sometimes described as burning, with no clear  relation to food intake. No associated nausea, vomiting, changes in bowel movements,  or changes in urine. No jaundice observed. Patient has a history of alcohol consumption, which could potentially contribute to the symptoms. -Order blood work to assess liver function and pancreas. -Order urinalysis to rule out urinary tract issues. POCT ua dipstick showed trace of blood and leukocytes. Ordered ur culture and microscopy. -Order ultrasound to evaluate liver, gallbladder, and pancreas. -Advise patient to reduce alcohol consumption.  Gastroesophageal Reflux Disease (GERD) Patient reports taking omeprazole for acid reflux. Pain is also present in the epigastric region, which could be related to GERD. -Continue omeprazole. -Advise patient to avoid foods that trigger acid reflux, eat to 75-80% fullness, avoid eating at least three hours before bed, and elevate head while sleeping. -Order testing for ulcer as a precaution due to the presence of epigastric pain.  Hypertension Blood pressure was noted to be slightly low during the visit. -Advise patient to continue taking blood pressure medication.      No follow-ups on file.     The patient was advised to call back or seek an in-person evaluation if the symptoms worsen or if the condition fails to improve as anticipated.  I discussed the assessment and treatment plan with the patient. The patient was provided an opportunity to ask questions and all were answered. The patient agreed with the plan and demonstrated an understanding of the instructions.  I, Debera Lat, PA-C have reviewed all documentation for this visit. The documentation on  09/10/22  for the exam, diagnosis, procedures, and orders are all accurate and complete.  Debera Lat, [facility], M[facility] [phone] (phone) [phone] (fax)  [facility]

## 2022-09-11 LAB — URINALYSIS, MICROSCOPIC ONLY
Bacteria, UA: NONE SEEN
Casts: NONE SEEN /LPF
Epithelial Cells (non renal): NONE SEEN /HPF (ref 0–10)
RBC, Urine: NONE SEEN /HPF (ref 0–2)
WBC, UA: NONE SEEN /HPF (ref 0–5)

## 2022-09-12 LAB — LIPASE: Lipase: 37 U/L (ref 13–78)

## 2022-09-12 LAB — CBC WITH DIFFERENTIAL/PLATELET
Basophils Absolute: 0.1 10*3/uL (ref 0.0–0.2)
Basos: 1 %
EOS (ABSOLUTE): 0.2 10*3/uL (ref 0.0–0.4)
Eos: 3 %
Hematocrit: 42.7 % (ref 37.5–51.0)
Hemoglobin: 14.4 g/dL (ref 13.0–17.7)
Immature Grans (Abs): 0 10*3/uL (ref 0.0–0.1)
Immature Granulocytes: 0 %
Lymphocytes Absolute: 2.7 10*3/uL (ref 0.7–3.1)
Lymphs: 34 %
MCH: 32.9 pg (ref 26.6–33.0)
MCHC: 33.7 g/dL (ref 31.5–35.7)
MCV: 98 fL — ABNORMAL HIGH (ref 79–97)
Monocytes Absolute: 0.8 10*3/uL (ref 0.1–0.9)
Monocytes: 10 %
Neutrophils Absolute: 4 10*3/uL (ref 1.4–7.0)
Neutrophils: 52 %
Platelets: 180 10*3/uL (ref 150–450)
RBC: 4.38 x10E6/uL (ref 4.14–5.80)
RDW: 12.6 % (ref 11.6–15.4)
WBC: 7.8 10*3/uL (ref 3.4–10.8)

## 2022-09-12 LAB — COMPREHENSIVE METABOLIC PANEL
ALT: 37 IU/L (ref 0–44)
AST: 30 IU/L (ref 0–40)
Albumin: 4.2 g/dL (ref 3.9–4.9)
Alkaline Phosphatase: 67 IU/L (ref 44–121)
BUN/Creatinine Ratio: 21 (ref 10–24)
BUN: 19 mg/dL (ref 8–27)
Bilirubin Total: 0.3 mg/dL (ref 0.0–1.2)
CO2: 23 mmol/L (ref 20–29)
Calcium: 9.6 mg/dL (ref 8.6–10.2)
Chloride: 101 mmol/L (ref 96–106)
Creatinine, Ser: 0.9 mg/dL (ref 0.76–1.27)
Globulin, Total: 2.6 g/dL (ref 1.5–4.5)
Glucose: 106 mg/dL — ABNORMAL HIGH (ref 70–99)
Potassium: 4.5 mmol/L (ref 3.5–5.2)
Sodium: 139 mmol/L (ref 134–144)
Total Protein: 6.8 g/dL (ref 6.0–8.5)
eGFR: 92 mL/min/{1.73_m2} (ref >59)

## 2022-09-12 LAB — H. PYLORI BREATH TEST: H pylori Breath Test: NEGATIVE

## 2022-09-13 LAB — URINE CULTURE: Organism ID, Bacteria: NO GROWTH

## 2022-09-17 ENCOUNTER — Ambulatory Visit
Admission: RE | Admit: 2022-09-17 | Discharge: 2022-09-17 | Disposition: A | Discharge status: Home / Self Care | Payer: Medicare Other | Source: Ambulatory Visit | Attending: Physician Assistant | Admitting: Physician Assistant

## 2022-09-17 ENCOUNTER — Telehealth: Payer: Self-pay

## 2022-09-17 DIAGNOSIS — R1011 Right upper quadrant pain: Secondary | ICD-10-CM | POA: Diagnosis not present

## 2022-09-17 NOTE — Telephone Encounter (Signed)
Patient aware of labs results and that we will call him once we receive the [place].

## 2022-09-17 NOTE — Telephone Encounter (Signed)
Copied from CRM [id number]. Topic: General - Other >> Sep 17, 2022  8:10 AM Gary Carter wrote: Reason for CRM: The patient would like to be contacted by a member of staff when possible to review their labs from 09/10/22  Please contact further when possible

## 2022-09-18 ENCOUNTER — Other Ambulatory Visit: Payer: Self-pay | Admitting: Family Medicine

## 2022-09-18 DIAGNOSIS — J301 Allergic rhinitis due to pollen: Secondary | ICD-10-CM

## 2022-09-20 ENCOUNTER — Other Ambulatory Visit: Payer: Self-pay | Admitting: Family Medicine

## 2022-09-20 DIAGNOSIS — G8929 Other chronic pain: Secondary | ICD-10-CM

## 2022-09-20 NOTE — Telephone Encounter (Signed)
Medication Refill - Medication: HYDROcodone-acetaminophen (NORCO) 10-325 MG tablet [[id number]]   Has the patient contacted their pharmacy? Yes.    (Agent: If yes, when and what did the pharmacy advise?) Contact PCP, controlled substance   Preferred Pharmacy (with phone number or street name[id number] [place], [place] - [address]   Has the patient been seen for an appointment in the last year OR does the patient have an upcoming appointment? Yes.    Agent: Please be advised that RX refills may take up to 3 business days. We ask that you follow-up with your pharmacy.

## 2022-09-21 ENCOUNTER — Ambulatory Visit: Payer: Self-pay | Admitting: *Deleted

## 2022-09-21 NOTE — Telephone Encounter (Signed)
Reason for Disposition  [1] Follow-up call to recent contact AND [2] information only call, no triage required  Answer Assessment - Initial Assessment Questions 1. REASON FOR CALL or QUESTION: "What is your reason for calling today?" or "How can I best help you?" or "What question do you have that I can help answer?"     Pt called in and was given his ultrasound result message from Debera Lat, PA-C dated 09/20/2022 at 9:25 AM.  "I will watch my diet and eat less fatty stuff".   "I walk 10 miles a day on my job so I get plenty of exercise".  Protocols used: Information Only Call - No Triage-A-AH

## 2022-09-21 NOTE — Telephone Encounter (Signed)
Requested medications are due for refill today.  yes  Requested medications are on the active medications list.  yes  Last refill. 09/03/2022 #60 0 rf  Future visit scheduled.   no  Notes to clinic.  Refill not delegated.    Requested Prescriptions  Pending Prescriptions Disp Refills   HYDROcodone-acetaminophen (NORCO) 10-325 MG tablet 60 tablet 0    Sig: Take 1 tablet by mouth every 6 (six) hours as needed.     Not Delegated - Analgesics:  Opioid Agonist Combinations Failed - 09/20/2022  3:25 PM      Failed - This refill cannot be delegated      Failed - Urine Drug Screen completed in last 360 days      Passed - Valid encounter within last 3 months    Recent Outpatient Visits           1 week ago RUQ pain    [facility] [place], [place], PA-C   4 months ago Toe pain, right   [facility] Malva Limes, MD   7 months ago Enteritis   [facility] [place], [place], PA-C   8 months ago Enteritis   [facility] Malva Limes, MD   10 months ago Depression, recurrent [facility])   [facility] Malva Limes, MD

## 2022-09-21 NOTE — Telephone Encounter (Signed)
Requested Prescriptions  Refused Prescriptions Disp Refills   valsartan-hydrochlorothiazide (DIOVAN-HCT) 160-25 MG tablet [Pharmacy Med Name: VALSARTAN-HCTZ 160-25 MG TAB] 90 tablet 0    Sig: TAKE 1 TABLET BY MOUTH DAILY. TAKE IN PLACE OF ATACAND-HCTZ     Cardiovascular: ARB + Diuretic Combos Failed - 09/20/2022  1:52 PM      Failed - Last BP in normal range    BP Readings from Last 1 Encounters:  09/10/22 (!) 135/93         Passed - K in normal range and within 180 days    Potassium  Date Value Ref Range Status  09/10/2022 4.5 3.5 - 5.2 mmol/L Final         Passed - Na in normal range and within 180 days    Sodium  Date Value Ref Range Status  09/10/2022 139 134 - 144 mmol/L Final         Passed - Cr in normal range and within 180 days    Creatinine, Ser  Date Value Ref Range Status  09/10/2022 0.90 0.76 - 1.27 mg/dL Final         Passed - eGFR is 10 or above and within 180 days    GFR calc Af Amer  Date Value Ref Range Status  05/18/2019 107 >59 mL/min/1.73 Final    Comment:    **[facility] currently reports eGFR in compliance with the current**   recommendations of the [employer]. [facility] will   update reporting as new guidelines are published from the NKF-ASN   Task force.    GFR, Estimated  Date Value Ref Range Status  12/26/2021 >60 >60 mL/min Final    Comment:    (NOTE) Calculated using the CKD-EPI Creatinine Equation (2021)    eGFR  Date Value Ref Range Status  09/10/2022 92 >59 mL/min/1.73 Final         Passed - Patient is not pregnant      Passed - Valid encounter within last 6 months    Recent Outpatient Visits           1 week ago RUQ pain   [facility] [place], [place], PA-C   4 months ago Toe pain, right   [facility] Malva Limes, MD   7 months ago Enteritis   [facility] [place], [place], PA-C   8 months ago Enteritis   [facility] Malva Limes, MD   10 months ago Depression, recurrent [facility])   [facility] Malva Limes, MD

## 2022-09-21 NOTE — Telephone Encounter (Signed)
  Chief Complaint: Pt called in and was given his ultrasound result message from Debera Lat, PApC Symptoms: N/A Frequency: N/A Pertinent Negatives: Patient denies N/A Disposition: [] ED /[] Urgent Care (no appt availability in office) / [] Appointment(In office/virtual)/ []  [place] [facility]/ [x] Home Care/ [] Refused Recommended Disposition /[] [place] [facility]/ []  Follow-up with PCP Additional Notes:

## 2022-09-22 MED ORDER — HYDROCODONE-ACETAMINOPHEN 10-325 MG PO TABS: 1.0000 | ORAL_TABLET | Freq: Four times a day (QID) | ORAL | 0 refills | Status: DC | PRN

## 2022-09-22 NOTE — Telephone Encounter (Signed)
Patient calling again to get refill on Hydrocodone.   He is out and needs this badly he said.

## 2022-09-23 ENCOUNTER — Encounter: Payer: Self-pay | Admitting: Physician Assistant

## 2022-10-06 ENCOUNTER — Other Ambulatory Visit: Payer: Self-pay | Admitting: Family Medicine

## 2022-10-06 DIAGNOSIS — G8929 Other chronic pain: Secondary | ICD-10-CM

## 2022-10-06 NOTE — Telephone Encounter (Signed)
Medication Refill - Medication:  HYDROcodone-acetaminophen (NORCO) 10-325 MG tablet    Has the patient contacted their pharmacy? No. (He calls every 2 weeks for refill  Preferred Pharmacy (with phone number or street name):  [id number] - [place], [place] - [address]   Has the patient been seen for an appointment in the last year OR does the patient have an upcoming appointment? Yes.    Agent: Please be advised that RX refills may take up to 3 business days. We ask that you follow-up with your pharmacy.

## 2022-10-07 MED ORDER — HYDROCODONE-ACETAMINOPHEN 10-325 MG PO TABS: 1.0000 | ORAL_TABLET | Freq: Four times a day (QID) | ORAL | 0 refills | Status: DC | PRN

## 2022-10-07 NOTE — Telephone Encounter (Signed)
Requested medication (s) are due for refill today:   Provider to review  Requested medication (s) are on the active medication list:   Yes  Future visit scheduled:   Yes for AWV   Last ordered: 09/22/2022 #60, 0 refills  Non delegated refill    Requested Prescriptions  Pending Prescriptions Disp Refills   HYDROcodone-acetaminophen (NORCO) 10-325 MG tablet 60 tablet 0    Sig: Take 1 tablet by mouth every 6 (six) hours as needed.     Not Delegated - Analgesics:  Opioid Agonist Combinations Failed - 10/06/2022  8:24 AM      Failed - This refill cannot be delegated      Failed - Urine Drug Screen completed in last 360 days      Passed - Valid encounter within last 3 months    Recent Outpatient Visits           3 weeks ago RUQ pain   [facility] [place], [place], PA-C   5 months ago Toe pain, right   [facility] Malva Limes, MD   8 months ago Enteritis   [facility] [place], [place], PA-C   8 months ago Enteritis   [facility] Malva Limes, MD   11 months ago Depression, recurrent [facility])   [facility] Malva Limes, MD

## 2022-10-13 ENCOUNTER — Encounter: Payer: Self-pay | Admitting: Emergency Medicine

## 2022-10-13 ENCOUNTER — Ambulatory Visit
Admission: EM | Admit: 2022-10-13 | Discharge: 2022-10-13 | Disposition: A | Discharge status: Home / Self Care | Payer: Medicare Other | Attending: Physician Assistant | Admitting: Physician Assistant

## 2022-10-13 DIAGNOSIS — J302 Other seasonal allergic rhinitis: Secondary | ICD-10-CM | POA: Diagnosis not present

## 2022-10-13 DIAGNOSIS — G8929 Other chronic pain: Secondary | ICD-10-CM | POA: Diagnosis not present

## 2022-10-13 DIAGNOSIS — M542 Cervicalgia: Secondary | ICD-10-CM | POA: Diagnosis not present

## 2022-10-13 DIAGNOSIS — G44209 Tension-type headache, unspecified, not intractable: Secondary | ICD-10-CM | POA: Diagnosis not present

## 2022-10-13 MED ORDER — IPRATROPIUM BROMIDE 0.06 % NA SOLN: 2.0000 | Freq: Four times a day (QID) | NASAL | 0 refills | Status: AC

## 2022-10-13 MED ORDER — BACLOFEN 10 MG PO TABS: 10.0000 mg | ORAL_TABLET | Freq: Three times a day (TID) | ORAL | 0 refills | Status: DC | PRN

## 2022-10-13 MED ORDER — PREDNISONE 10 MG PO TABS: ORAL_TABLET | ORAL | 0 refills | Status: DC

## 2022-10-13 MED ORDER — KETOROLAC TROMETHAMINE 60 MG/2ML IM SOLN
30.0000 mg | Freq: Once | INTRAMUSCULAR | Status: AC
  Administered 2022-10-13: 30 mg via INTRAMUSCULAR

## 2022-10-13 NOTE — ED Triage Notes (Signed)
Pt c/o sinus pressure, headache and drainage x 3 weeks.
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
## 2022-10-13 NOTE — Discharge Instructions (Addendum)
-  Symptoms are consistent with tension type headache, chronic neck pain and seasonal allergies.  Low suspicion for bacterial sinusitis. - We gave you an injection of Toradol in the clinic.  This is any anti-inflammatory medication.  Do not take any other NSAIDs for at least 12 hours.  I sent prednisone to the pharmacy.  You may start taper as soon as you pick up the prescription.  I also sent a muscle relaxer. - Start using the nasal spray and switch to Allegra-D.  Ask pharmacy staff for this medication as it is behind the counter.  NECK PAIN: Stressed avoiding painful activities. This can exacerbate your symptoms and make them worse.  May apply heat to the areas of pain for some relief. Use medications as directed. Be aware of which medications make you drowsy and do not drive or operate any kind of heavy machinery while using the medication (ie pain medications or muscle relaxers). F/U with PCP for reexamination or return sooner if condition worsens or does not begin to improve over the next few days.   NECK PAIN RED FLAGS: If symptoms get worse than they are right now, you should come back sooner for re-evaluation. If you have increased numbness/ tingling or notice that the numbness/tingling is affecting the legs or saddle region, go to ER. If you ever lose continence go to ER.

## 2022-10-14 ENCOUNTER — Ambulatory Visit: Payer: Self-pay | Admitting: *Deleted
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
## 2022-10-15 ENCOUNTER — Ambulatory Visit (INDEPENDENT_AMBULATORY_CARE_PROVIDER_SITE_OTHER): Payer: Medicare Other | Admitting: Family Medicine

## 2022-10-15 VITALS — BP 128/88 | HR 64 | Temp 98.4°F | Resp 16 | Ht 69.0 in | Wt 194.0 lb

## 2022-10-15 DIAGNOSIS — M5021 Other cervical disc displacement,  high cervical region: Secondary | ICD-10-CM | POA: Diagnosis not present

## 2022-10-15 DIAGNOSIS — M4802 Spinal stenosis, cervical region: Secondary | ICD-10-CM

## 2022-10-15 DIAGNOSIS — M542 Cervicalgia: Secondary | ICD-10-CM
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
## 2022-10-18 ENCOUNTER — Telehealth: Payer: Self-pay

## 2022-10-18 NOTE — Telephone Encounter (Signed)
Copied from CRM [id number]. Topic: Referral - Question >> Oct 18, 2022  9:59 AM Everette C wrote: Reason for CRM: Tori with [facility] has called to share that a referral for neurosurgery was received for the patient at the fax number [phone]   Delorise Jackson shares that that fax would be the incorrect number/dept to receive that type of referral  Please contact further if needed

## 2022-10-19 ENCOUNTER — Telehealth: Payer: Self-pay

## 2022-10-19 DIAGNOSIS — M4802 Spinal stenosis, cervical region: Secondary | ICD-10-CM

## 2022-10-19 MED ORDER — PREDNISONE 10 MG PO TABS
ORAL_TABLET | ORAL | 0 refills | Status: AC
Start: 2022-10-19 — End: 2022-10-25

## 2022-10-19 NOTE — Telephone Encounter (Signed)
Copied from CRM [id number]. Topic: General - Inquiry >> Oct 18, 2022  4:52 PM Runell Gess P wrote: Reason for CRM: pt called asking for another prescription of prednisone.  He said he talked to you last week and you told him you could give me a refill although you did not prescribe the first round.  He said he is still hurting but feeling better.  CB#  [phone]  [facility] [place]

## 2022-10-21 ENCOUNTER — Other Ambulatory Visit: Payer: Self-pay | Admitting: Family Medicine

## 2022-10-21 DIAGNOSIS — G8929 Other chronic pain: Secondary | ICD-10-CM

## 2022-10-21 MED ORDER — HYDROCODONE-ACETAMINOPHEN 10-325 MG PO TABS: 1.0000 | ORAL_TABLET | Freq: Four times a day (QID) | ORAL | 0 refills | Status: DC | PRN

## 2022-10-21 NOTE — Telephone Encounter (Signed)
Medication Refill - Medication: HYDROcodone-acetaminophen (NORCO) 10-325 MG tablet   Has the patient contacted their pharmacy? Yes.   (Agent: If no, request that the patient contact the pharmacy for the refill. If patient does not wish to contact the pharmacy document the reason why and proceed with request.) (Agent: If yes, when and what did the pharmacy advise?)  Preferred Pharmacy (with phone number or street name):  [id number] [address] [id number] Phone: [phone]: [phone]  Has the patient been seen for an appointment in the last year OR does the patient have an upcoming appointment? Yes.    Agent: Please be advised that RX refills may take up to 3 business days. We ask that you follow-up with your pharmacy.

## 2022-10-21 NOTE — Telephone Encounter (Signed)
Requested medication (s) are due for refill today: review  Requested medication (s) are on the active medication list: yes  Last refill:  10/07/22 #60/0  Future visit scheduled: no  Notes to clinic:  Unable to refill per protocol, cannot delegate.      Requested Prescriptions  Pending Prescriptions Disp Refills   HYDROcodone-acetaminophen (NORCO) 10-325 MG tablet 60 tablet 0    Sig: Take 1 tablet by mouth every 6 (six) hours as needed.     Not Delegated - Analgesics:  Opioid Agonist Combinations Failed - 10/21/2022  9:27 AM      Failed - This refill cannot be delegated      Failed - Urine Drug Screen completed in last 360 days      Passed - Valid encounter within last 3 months    Recent Outpatient Visits           6 days ago Foraminal stenosis of cervical region   [facility] Malva Limes, MD   1 month ago RUQ pain   [facility] [place], [place], PA-C   5 months ago Toe pain, right   [facility] Sherrie Mustache, Demetrios Isaacs, MD   8 months ago Enteritis   [facility] [place], [place], PA-C   9 months ago Enteritis   [facility] Sherrie Mustache, Demetrios Isaacs, MD

## 2022-10-22 ENCOUNTER — Other Ambulatory Visit: Payer: Self-pay | Admitting: Family Medicine

## 2022-11-01 ENCOUNTER — Ambulatory Visit: Payer: Self-pay

## 2022-11-01 MED ORDER — FAMOTIDINE 20 MG PO TABS: 20.0000 mg | ORAL_TABLET | Freq: Two times a day (BID) | ORAL | 3 refills | Status: DC | PRN

## 2022-11-01 MED ORDER — OMEPRAZOLE 40 MG PO CPDR: 40.0000 mg | DELAYED_RELEASE_CAPSULE | Freq: Every day | ORAL | 0 refills | Status: DC

## 2022-11-01 NOTE — Telephone Encounter (Signed)
Maximum dose of omeprazole is one tablet daily, but he can take prescription for famotidine in addition to it. [facility] If his insurance want cover the omeprazole yet, then he will just have to take the OTC until it does.

## 2022-11-01 NOTE — Telephone Encounter (Signed)
Summary: Question acid reflux   Throat burning... stated he called the pharmacy and they said he can not have the omeprazole (PRILOSEC) 40 MG capsule [[id number] until november.  What else can he take. Please advise.    Unable to leave message mailbox is full.

## 2022-11-01 NOTE — Telephone Encounter (Signed)
  Chief Complaint: Out of Omeprazole Symptoms: Acid reflux Frequency: ongoing - worse since taking more medication for HA Pertinent Negatives: Patient denies  Disposition: [] ED /[] Urgent Care (no appt availability in office) / [] Appointment(In office/virtual)/ []  [place] [facility]/ [] Home Care/ [] Refused Recommended Disposition /[] [facility]/ [x]  Follow-up with PCP Additional Notes: Pt has been taking 2 omeprazole, 1 in the morning and one in the evening to control acid reflux. Per rx pt should only be taking medication 1x daily. Pt is out of this medication d/t the increased dose. Pt is taking the extra medication to control acid reflux. Pt thinks he is having more acid reflux d/t taking additional medication to control HA.  Pt is out of Omeprazole, insurance will not pay for more until November. Pt would like to know if it is safe to take 2 a day. If it is, can provider re-write rx for 2 times daily. Or is there another medication the pt can take for this.  Please advise.    Summary: Question acid reflux   Throat burning... stated he called the pharmacy and they said he can not have the omeprazole (PRILOSEC) 40 MG capsule [[id number] until november.  What else can he take. Please advise.     Reason for Disposition  [1] Caller has URGENT medicine question about med that PCP or specialist prescribed AND [2] triager unable to answer question  Answer Assessment - Initial Assessment Questions 1. NAME of MEDICINE: "What medicine(s) are you calling about?"     Omeprazole 40 2. QUESTION: "What is your question?" (e.g., double dose of medicine, side effect)     Pt is out of this medication 3. PRESCRIBER: "Who prescribed the medicine?" Reason: if prescribed by specialist, call should be referred to that group.     Dr. Sherrie Mustache 4. SYMPTOMS: "Do you have any symptoms?" If Yes, ask: "What symptoms are you having?"  "How bad are the symptoms (e.g., mild, moderate, severe)     Acid  reflux  Protocols used: Medication Question Call-A-AH

## 2022-11-01 NOTE — Addendum Note (Signed)
Addended by: Malva Limes on: 11/01/2022 05:02 PM   Modules accepted: Orders

## 2022-11-02 NOTE — Telephone Encounter (Signed)
Patient advised.

## 2022-11-04 ENCOUNTER — Other Ambulatory Visit: Payer: Self-pay | Admitting: Family Medicine

## 2022-11-04 DIAGNOSIS — G8929 Other chronic pain: Secondary | ICD-10-CM

## 2022-11-04 NOTE — Telephone Encounter (Signed)
Medication Refill - Medication: HYDROcodone-acetaminophen (NORCO) 10-325 MG tablet  Has the patient contacted their pharmacy? No. No, more refills.  (Agent: If no, request that the patient contact the pharmacy for the refill. If patient does not wish to contact the pharmacy document the reason why and proceed with request.)   Preferred Pharmacy (with phone number or street name):  [id number] [place] [place]  [place] [address]  Phone: [phone] Fax: [phone]  Hours: Not open 24 hours   Has the patient been seen for an appointment in the last year OR does the patient have an upcoming appointment? Yes.    Agent: Please be advised that RX refills may take up to 3 business days. We ask that you follow-up with your pharmacy.

## 2022-11-05 NOTE — Telephone Encounter (Signed)
Requested medication (s) are due for refill today - yes  Requested medication (s) are on the active medication list -yes  Future visit scheduled -no  Last refill: 10/21/22 #60  Notes to clinic: non delegated Rx  Requested Prescriptions  Pending Prescriptions Disp Refills   HYDROcodone-acetaminophen (NORCO) 10-325 MG tablet 60 tablet 0    Sig: Take 1 tablet by mouth every 6 (six) hours as needed.     Not Delegated - Analgesics:  Opioid Agonist Combinations Failed - 11/04/2022 11:34 AM      Failed - This refill cannot be delegated      Failed - Urine Drug Screen completed in last 360 days      Passed - Valid encounter within last 3 months    Recent Outpatient Visits           3 weeks ago Foraminal stenosis of cervical region   [facility] Malva Limes, MD   1 month ago RUQ pain   [facility] [place], [place], PA-C   6 months ago Toe pain, right   [facility] Malva Limes, MD   9 months ago Enteritis   [facility] [place], [place], PA-C   9 months ago Enteritis   [facility] Malva Limes, MD                 Requested Prescriptions  Pending Prescriptions Disp Refills   HYDROcodone-acetaminophen (NORCO) 10-325 MG tablet 60 tablet 0    Sig: Take 1 tablet by mouth every 6 (six) hours as needed.     Not Delegated - Analgesics:  Opioid Agonist Combinations Failed - 11/04/2022 11:34 AM      Failed - This refill cannot be delegated      Failed - Urine Drug Screen completed in last 360 days      Passed - Valid encounter within last 3 months    Recent Outpatient Visits           3 weeks ago Foraminal stenosis of cervical region   [facility] Malva Limes, MD   1 month ago RUQ pain    [facility] [place], [place], PA-C   6 months ago Toe pain, right   [facility] Malva Limes, MD   9 months ago Enteritis   [facility] [place], [place], PA-C   9 months ago Enteritis   [facility] Sherrie Mustache, Demetrios Isaacs, MD

## 2022-11-07 ENCOUNTER — Other Ambulatory Visit: Payer: Self-pay | Admitting: Family Medicine

## 2022-11-08 MED ORDER — HYDROCODONE-ACETAMINOPHEN 10-325 MG PO TABS: 1.0000 | ORAL_TABLET | Freq: Four times a day (QID) | ORAL | 0 refills | Status: DC | PRN

## 2022-11-22 ENCOUNTER — Other Ambulatory Visit: Payer: Self-pay | Admitting: Family Medicine

## 2022-11-22 DIAGNOSIS — G8929 Other chronic pain: Secondary | ICD-10-CM

## 2022-11-22 NOTE — Telephone Encounter (Signed)
Medication Refill -  Most Recent Primary Care Visit:  Provider: Malva Limes  Department: [facility]  Visit Type: OFFICE VISIT  Date: 10/15/2022  Medication: HYDROcodone-acetaminophen (NORCO) 10-325 MG tablet   Has the patient contacted their pharmacy? no Controlled substance so called directly in  Is this the correct pharmacy for this prescription? yes If no, delete pharmacy and type the correct one.  This is the patient's preferred pharmacy[facility]  [phone] [place], [place] - [address] [place] [address] Phone: [phone] Fax: [phone]   Has the prescription been filled recently? No Is the patient out of the medication? No has 1 day and 1/2 left  Has the patient been seen for an appointment in the last year OR does the patient have an upcoming appointment? yes  Can we respond through MyChart? yes  Agent: Please be advised that Rx refills may take up to 3 business days. We ask that you follow-up with your pharmacy.

## 2022-11-23 MED ORDER — HYDROCODONE-ACETAMINOPHEN 10-325 MG PO TABS: 1.0000 | ORAL_TABLET | Freq: Four times a day (QID) | ORAL | 0 refills | Status: DC | PRN

## 2022-12-06 ENCOUNTER — Other Ambulatory Visit: Payer: Self-pay | Admitting: Family Medicine

## 2022-12-07 ENCOUNTER — Other Ambulatory Visit: Payer: Self-pay | Admitting: Family Medicine

## 2022-12-07 DIAGNOSIS — G8929 Other chronic pain: Secondary | ICD-10-CM

## 2022-12-07 NOTE — Telephone Encounter (Signed)
Medication Refill -  Most Recent Primary Care Visit:  Provider: Malva Limes  Department: [facility]  Visit Type: OFFICE VISIT  Date: 10/15/2022  Medication: HYDROcodone-acetaminophen (NORCO) 10-325 MG tablet   Has the patient contacted their pharmacy? No (Agent: If no, request that the patient contact the pharmacy for the refill. If patient does not wish to contact the pharmacy document the reason why and proceed with request.)   Is this the correct pharmacy for this prescription? Yes If no, delete pharmacy and type the correct one.  This is the patient's preferred pharmacy:  [facility] [phone] [place] [address], [place] - [address] [place] [address] Phone: [phone] Fax: [phone]   Has the prescription been filled recently? Yes  Is the patient out of the medication? No  Has the patient been seen for an appointment in the last year OR does the patient have an upcoming appointment? Yes  Can we respond through MyChart? Yes  Agent: Please be advised that Rx refills may take up to 3 business days. We ask that you follow-up with your pharmacy.

## 2022-12-08 MED ORDER — HYDROCODONE-ACETAMINOPHEN 10-325 MG PO TABS: 1.0000 | ORAL_TABLET | Freq: Four times a day (QID) | ORAL | 0 refills | Status: DC | PRN

## 2022-12-08 NOTE — Telephone Encounter (Signed)
Requested medication (s) are due for refill today: yes  Requested medication (s) are on the active medication list: yes    Last refill: 11/23/22  #60  0 refills  Future visit scheduled no  Notes to clinic:Not delegated, please review. Thank you.  Requested Prescriptions  Pending Prescriptions Disp Refills   HYDROcodone-acetaminophen (NORCO) 10-325 MG tablet 60 tablet 0    Sig: Take 1 tablet by mouth every 6 (six) hours as needed.     Not Delegated - Analgesics:  Opioid Agonist Combinations Failed - 12/07/2022  3:28 PM      Failed - This refill cannot be delegated      Failed - Urine Drug Screen completed in last 360 days      Passed - Valid encounter within last 3 months    Recent Outpatient Visits           1 month ago Foraminal stenosis of cervical region   [facility] Malva Limes, MD   2 months ago RUQ pain   [facility] [place], [place], PA-C   7 months ago Toe pain, right   [facility] Sherrie Mustache, Demetrios Isaacs, MD   10 months ago Enteritis   [facility] [place], [place], PA-C   10 months ago Enteritis   [facility] Sherrie Mustache, Demetrios Isaacs, MD

## 2022-12-24 ENCOUNTER — Other Ambulatory Visit: Payer: Self-pay | Admitting: Family Medicine

## 2022-12-24 DIAGNOSIS — G8929 Other chronic pain: Secondary | ICD-10-CM

## 2022-12-24 NOTE — Telephone Encounter (Signed)
Medication Refill -  Most Recent Primary Care Visit:  Provider: Malva Limes  Department: [facility]  Visit Type: OFFICE VISIT  Date: 10/15/2022  Medication: HYDROcodone-acetaminophen (NORCO) 10-325 MG tablet [[id number]]  Has the patient contacted their pharmacy? No   .  This is the patient's preferred pharmacy:  [phone] [place] [address], [place] - [address] [place] [address] Phone: [phone] Fax: [phone]   Has the prescription been filled recently? Yes  Is the patient out of the medication? No  enough for 3 days   Has the patient been seen for an appointment in the last year OR does the patient have an upcoming appointment? Yes  Can we respond through MyChart? No  Agent: Please be advised that Rx refills may take up to 3 business days. We ask that you follow-up with your pharmacy.

## 2022-12-24 NOTE — Telephone Encounter (Signed)
Requested medication (s) are due for refill today - unsure  Requested medication (s) are on the active medication list -yes  Future visit scheduled -no  Last refill: 12/08/22 #60  Notes to clinic: non delegated Rx  Requested Prescriptions  Pending Prescriptions Disp Refills   HYDROcodone-acetaminophen (NORCO) 10-325 MG tablet 60 tablet 0    Sig: Take 1 tablet by mouth every 6 (six) hours as needed.     Not Delegated - Analgesics:  Opioid Agonist Combinations Failed - 12/24/2022 11:35 AM      Failed - This refill cannot be delegated      Failed - Urine Drug Screen completed in last 360 days      Passed - Valid encounter within last 3 months    Recent Outpatient Visits           2 months ago Foraminal stenosis of cervical region   [facility]   3 months ago RUQ pain   [facility] [place], [place], PA-C   8 months ago Toe pain, right   [facility] Malva Limes, MD   11 months ago Enteritis   [facility] [place], [place], PA-C   11 months ago Enteritis   [facility] Malva Limes, MD                 Requested Prescriptions  Pending Prescriptions Disp Refills   HYDROcodone-acetaminophen (NORCO) 10-325 MG tablet 60 tablet 0    Sig: Take 1 tablet by mouth every 6 (six) hours as needed.     Not Delegated - Analgesics:  Opioid Agonist Combinations Failed - 12/24/2022 11:35 AM      Failed - This refill cannot be delegated      Failed - Urine Drug Screen completed in last 360 days      Passed - Valid encounter within last 3 months    Recent Outpatient Visits           2 months ago Foraminal stenosis of cervical region   [facility] Malva Limes, MD   3 months ago RUQ pain   [facility] [place], [place], PA-C   8 months ago Toe pain, right   [facility] Malva Limes, MD   11 months ago Enteritis   [facility] [place], [place], PA-C   11 months ago Enteritis   [facility] Sherrie Mustache, Demetrios Isaacs, MD

## 2022-12-25 MED ORDER — HYDROCODONE-ACETAMINOPHEN 10-325 MG PO TABS: 1.0000 | ORAL_TABLET | Freq: Four times a day (QID) | ORAL | 0 refills | Status: DC | PRN

## 2023-01-06 ENCOUNTER — Other Ambulatory Visit: Payer: Self-pay | Admitting: Family Medicine

## 2023-01-06 DIAGNOSIS — G8929 Other chronic pain: Secondary | ICD-10-CM

## 2023-01-06 NOTE — Telephone Encounter (Signed)
Medication Refill -  Most Recent Primary Care Visit:  Provider: Malva Limes  Department: [facility]  Visit Type: OFFICE VISIT  Date: 10/15/2022  Medication: HYDROcodone-acetaminophen (NORCO) 10-325 MG tablet   Has the patient contacted their pharmacy? Yes   Is this the correct pharmacy for this prescription? Yes  [phone] [place] [facility], [place] - [address] [place] [address] Phone: [phone] Fax: [phone]   Has the prescription been filled recently? Yes  Is the patient out of the medication? Yes  Has the patient been seen for an appointment in the last year OR does the patient have an upcoming appointment? Yes  Can we respond through MyChart? No  Agent: Please be advised that Rx refills may take up to 3 business days. We ask that you follow-up with your pharmacy.

## 2023-01-10 ENCOUNTER — Other Ambulatory Visit: Payer: Self-pay

## 2023-01-10 DIAGNOSIS — G8929 Other chronic pain: Secondary | ICD-10-CM

## 2023-01-10 NOTE — Addendum Note (Signed)
Addended by: Shirley Muscat on: 01/10/2023 03:28 PM   Modules accepted: Orders

## 2023-01-10 NOTE — Telephone Encounter (Signed)
Copied from CRM [id number]. Topic: General - Other >> Jan 10, 2023 12:13 PM Marlow Baars wrote: Reason for CRM: The patient called in again checking on the status of a refill that was put in last week for his pain meds. Please assist patient further as he said he only has 1 pill left

## 2023-01-11 ENCOUNTER — Other Ambulatory Visit: Payer: Self-pay | Admitting: Family Medicine

## 2023-01-11 DIAGNOSIS — I10 Essential (primary) hypertension: Secondary | ICD-10-CM

## 2023-01-11 MED ORDER — HYDROCODONE-ACETAMINOPHEN 10-325 MG PO TABS: 1.0000 | ORAL_TABLET | Freq: Four times a day (QID) | ORAL | 0 refills | Status: DC | PRN

## 2023-01-11 NOTE — Telephone Encounter (Signed)
 Requested medication (s) are due for refill today: no  Requested medication (s) are on the active medication list: yes    Last refill: 01/10/22  Future visit scheduled no  Notes to clinic: Duplicate. Receipt confirmed by pharmacy 01/11/23  at 9:13.  Cannot refuse non-delegated meds per protocol.  Requested Prescriptions  Pending Prescriptions Disp Refills   HYDROcodone -acetaminophen  (NORCO) 10-325 MG tablet 60 tablet 0    Sig: Take 1 tablet by mouth every 6 (six) hours as needed.     Not Delegated - Analgesics:  Opioid Agonist Combinations Failed - 01/11/2023 12:15 PM      Failed - This refill cannot be delegated      Failed - Urine Drug Screen completed in last 360 days      Passed - Valid encounter within last 3 months    Recent Outpatient Visits           2 months ago Foraminal stenosis of cervical region   [facility] [place] Nancyann BRAVO, MD   4 months ago RUQ pain   [facility] [place], [place], PA-C   8 months ago Toe pain, right   [facility] Gasper Nancyann BRAVO, MD   11 months ago Enteritis   [facility] [place], [place], PA-C   12 months ago Enteritis   [facility] [place], Nancyann BRAVO, MD

## 2023-01-18 DIAGNOSIS — L821 Other seborrheic keratosis: Secondary | ICD-10-CM | POA: Diagnosis not present

## 2023-01-18 DIAGNOSIS — L728 Other follicular cysts of the skin and subcutaneous tissue: Secondary | ICD-10-CM | POA: Diagnosis not present

## 2023-01-18 DIAGNOSIS — L538 Other specified erythematous conditions: Secondary | ICD-10-CM | POA: Diagnosis not present

## 2023-01-18 DIAGNOSIS — L218 Other seborrheic dermatitis: Secondary | ICD-10-CM | POA: Diagnosis not present

## 2023-01-18 DIAGNOSIS — R208 Other disturbances of skin sensation: Secondary | ICD-10-CM | POA: Diagnosis not present

## 2023-01-18 DIAGNOSIS — L2989 Other pruritus: Secondary | ICD-10-CM | POA: Diagnosis not present

## 2023-01-18 DIAGNOSIS — L578 Other skin changes due to chronic exposure to nonionizing radiation: Secondary | ICD-10-CM | POA: Diagnosis not present

## 2023-01-18 DIAGNOSIS — L820 Inflamed seborrheic keratosis: Secondary | ICD-10-CM | POA: Diagnosis not present

## 2023-01-24 ENCOUNTER — Telehealth: Payer: Self-pay

## 2023-01-24 ENCOUNTER — Other Ambulatory Visit: Payer: Self-pay | Admitting: Family Medicine

## 2023-01-24 DIAGNOSIS — G8929 Other chronic pain: Secondary | ICD-10-CM

## 2023-01-24 NOTE — Telephone Encounter (Signed)
 Copied from CRM [id number]. Topic: Referral - Status >> Jan 24, 2023  8:20 AM Franchot Heidelberg wrote: Reason for CRM: Pt called requesting to have his spine referral sent to [place] since he works in [employer] anyway.

## 2023-01-24 NOTE — Telephone Encounter (Signed)
 Medication Refill -  Most Recent Primary Care Visit:  Provider: GASPER NANCYANN BRAVO  Department: [facility]  Visit Type: OFFICE VISIT  Date: 10/15/2022  Medication: HYDROcodone -acetaminophen  (NORCO) 10-325 MG tablet   Has the patient contacted their pharmacy? Yes (Agent: If no, request that the patient contact the pharmacy for the refill. If patient does not wish to contact the pharmacy document the reason why and proceed with request.) (Agent: If yes, when and what did the pharmacy advise?)  Is this the correct pharmacy for this prescription? Yes If no, delete pharmacy and type the correct one.  This is the patient's preferred pharmacy:  [phone] [place], [place] - [address] [place] [address] Phone: [phone] Fax: [phone]   Has the prescription been filled recently? Yes  Is the patient out of the medication? Yes  Has the patient been seen for an appointment in the last year OR does the patient have an upcoming appointment? Yes  Can we respond through MyChart? Yes  Agent: Please be advised that Rx refills may take up to 3 business days. We ask that you follow-up with your pharmacy.

## 2023-01-25 NOTE — Telephone Encounter (Signed)
 Requested medication (s) are due for refill today: na  Requested medication (s) are on the active medication list: yes  Last refill:  01/11/23 #60 0 refills  Future visit scheduled: no   Notes to clinic:  not delegated per protocol. Do you want to refill Rx?     Requested Prescriptions  Pending Prescriptions Disp Refills   HYDROcodone -acetaminophen  (NORCO) 10-325 MG tablet 60 tablet 0    Sig: Take 1 tablet by mouth every 6 (six) hours as needed.     Not Delegated - Analgesics:  Opioid Agonist Combinations Failed - 01/25/2023  1:41 PM      Failed - This refill cannot be delegated      Failed - Urine Drug Screen completed in last 360 days      Failed - Valid encounter within last 3 months    Recent Outpatient Visits           3 months ago Foraminal stenosis of cervical region   [facility] [place] Nancyann BRAVO, MD   4 months ago RUQ pain   [place] [facility] [place], [place], PA-C   9 months ago Toe pain, right   [facility] [place] Nancyann BRAVO, MD   12 months ago Enteritis   [facility] [place], [place], PA-C   1 year ago Enteritis   [facility] Gasper Nancyann BRAVO, MD

## 2023-01-26 MED ORDER — HYDROCODONE-ACETAMINOPHEN 10-325 MG PO TABS: 1.0000 | ORAL_TABLET | Freq: Four times a day (QID) | ORAL | 0 refills | Status: DC | PRN

## 2023-01-27 ENCOUNTER — Ambulatory Visit: Payer: Self-pay

## 2023-01-27 NOTE — Telephone Encounter (Signed)
Summary: Smoking cessation advice   Pt called requesting medication to help him stop smoking, wants to discuss options.  [phone]      Called pt - Called was answered - white noise and disconnected.

## 2023-01-27 NOTE — Telephone Encounter (Signed)
Attempts x 3 made to contact patient with no response from patient.  Message will be closed and if patient contacts office new message will be opened

## 2023-01-27 NOTE — Telephone Encounter (Signed)
No answer and unable to leave a voicemail  Summary: Smoking cessation advice   Pt called requesting medication to help him stop smoking, wants to discuss options.  [phone]

## 2023-01-27 NOTE — Telephone Encounter (Signed)
Summary: Smoking cessation advice   Pt called requesting medication to help him stop smoking, wants to discuss options.  [phone]         Called pt - unable to leave message mailbox is full.  After 3 attempts, unable to contact pr. I will forward encounter to clinic for follow up.

## 2023-02-01 ENCOUNTER — Ambulatory Visit: Payer: Self-pay

## 2023-02-01 NOTE — Telephone Encounter (Signed)
  Chief Complaint: smoking cessation Symptoms: wanting to quit smoking Frequency: smoking 12+ years  Pertinent Negatives: NA Disposition: [] ED /[] Urgent Care (no appt availability in office) / [x] Appointment(In office/virtual)/ []  [place] [facility]/ [] Home Care/ [] Refused Recommended Disposition /[]  Mobile Bus/ []  Follow-up with PCP Additional Notes: pt has been smoking 12+ years. Pt wanting to stop now d/t having grandkids and feels like will have to have neck surgery in future and has been told won't be able to if smoking. Pt down to 5 cigs/day. Pt has tried OTC gum and patches and doesn't work. Pt also tried Wellbutrin in past that didn't work. Scheduled OV for 02/08/23 at 140 with Dr. Leonard Schwartz. Appt PCP had available didn't work for pt.   Summary: Smoking cessation advice     Pt called requesting medication to help him stop smoking, wants to discuss options.     Reason for Disposition  Requesting prescription medication to help quit smoking  Answer Assessment - Initial Assessment Questions 1. MAIN CONCERN or SYMPTOMS: "What question or concern do you have?" "What symptoms are you having?" (e.g., none, cough, difficulty breathing, headache, dizziness, nausea)      NA 2. ONSET:  "When did the symptoms begin?"     Wanting to stop medication  3. PRODUCT USED: "What do you smoke?" (e.g., cigarette, cigar, e-cigarette, pipe)      Cigarettes  4. OTHER PRODUCTS: "Do you use other tobacco or nicotine products?" (e.g., smokeless tobacco, chew or snuff, hookah, hand rolled tobacco in a leaf)      no 4. HOW OFTEN: "How many times per day or week do you typically smoke?" (e.g., 1 pack / week, half pack / day, 2 packs / day)     1 pack lasts 3 days, maybe 5 cigs a day  5.  HOW LONG: "How long have you smoked?" (e.g., months, years)     12 years  6. TREATMENT:  "What treatment have you tried? (e.g., none, gum, patch, medicine, treatment program)     Had Wellbutrin before but didn't  work  Protocols used: Smoking - Tobacco Use and Problems-A-AH

## 2023-02-02 ENCOUNTER — Encounter: Payer: Self-pay | Admitting: Emergency Medicine

## 2023-02-02 ENCOUNTER — Ambulatory Visit
Admission: EM | Admit: 2023-02-02 | Discharge: 2023-02-02 | Disposition: A | Discharge status: Home / Self Care | Payer: Medicare Other | Attending: Family Medicine | Admitting: Family Medicine

## 2023-02-02 DIAGNOSIS — U071 COVID-19: Secondary | ICD-10-CM | POA: Diagnosis not present

## 2023-02-02 LAB — RESP PANEL BY RT-PCR (RSV, FLU A&B, COVID)  RVPGX2
Influenza A by PCR: NEGATIVE
Influenza B by PCR: NEGATIVE
Resp Syncytial Virus by PCR: NEGATIVE
SARS Coronavirus 2 by RT PCR: POSITIVE — AB

## 2023-02-02 NOTE — Discharge Instructions (Signed)

## 2023-02-02 NOTE — ED Triage Notes (Signed)
Pt c/o cough, drainage, headache and bodyaces x 2 days.

## 2023-02-02 NOTE — ED Provider Notes (Signed)
[facility]    CSN: [id number] Arrival date & time: 02/02/23  5732      History   Chief Complaint Chief Complaint  Patient presents with   Headache   Nasal Congestion   Generalized Body Aches   Cough    HPI Gary Carter is a 73 y.o. male.   HPI  History obtained from the patient. Gary Carter presents for cough, runny nose, headache, body aches for the past 2 days. Has some chest tightness but no shortness of breath.  Has post-nasal drip with some throat irraton. Had fever and sweats. Has not taken anything for his symptoms. No vomiting or diarrhea.  Everyone where he works have been sick with Norovirus, pneumonia and COVID.   Has neck pain for the past month and is due to see the spine specialist today.     He smokes and is motivated to quit. He is working with his doctor and plans to start medication for this soon.  No history of asthma.       Past Medical History:  Diagnosis Date   Anxiety    Barrett's esophagus    GERD (gastroesophageal reflux disease)    History of chicken pox 06/12/2014   DID have Chicken Pox. DID have Measles. DID have Mumps. DID have Rubella.     History of dysphagia    History of fatty infiltration of liver    History of measles    History of mumps    History of rubella    Hyperlipidemia    Hypertension    Sleep apnea    SOB (shortness of breath)     Patient Active Problem List   Diagnosis Date Noted   Strabismus 02/12/2021   Acute right-sided low back pain with right-sided sciatica 11/28/2020   Status post eye surgery 05/09/2020   Gastritis 07/20/2018   Fatty infiltration of liver 07/03/2018   Gastroesophageal reflux disease with hiatal hernia 07/03/2018   Esophageal dysphagia 07/03/2018   Chronic, continuous use of opioids 11/15/2017   History of kidney stones 05/04/2016   Bunion 06/12/2014   Insomnia 06/12/2014   Panic attack 06/12/2014   Obstructive apnea 06/12/2014   Anxiety disorder 06/11/2014   H/O suicide  attempt 05/28/2010   Esophagitis, reflux 05/15/2008   Actinic keratoses 05/06/2008   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   Allergic rhinitis 03/08/2007   Arthralgia of hand 12/14/2006   HLD (hyperlipidemia) 08/15/2006   Family history of coronary arteriosclerosis 08/13/2006   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   Chronic low back pain 12/07/2005   Depression, recurrent (HCC) 12/05/2005   Barrett's esophagus without dysplasia 02/06/1997   Essential (primary) hypertension 02/06/1997   Current smoker 01/11/1958    Past Surgical History:  Procedure Laterality Date   ABDOMINAL SURGERY     BACK SURGERY  12-04-2003   Lumbar fusion & C5-C6 fusion   ESOPHAGOGASTRODUODENOSCOPY (EGD) WITH PROPOFOL N/A 07/10/2018   Procedure: ESOPHAGOGASTRODUODENOSCOPY (EGD) WITH PROPOFOL;  Surgeon: Scot Jun, MD;  Location: [facility] END[facility]COPY;  Service: Endoscopy;  Laterality: N/A;   HERNIA REPAIR     Myocardial perfusion scan  10/10/2006   NECK SURGERY     NERVE, TENDON AND ARTERY REPAIR Left 10/18/2012   Procedure: NERVE, TENDON AND ARTERY REPAIR;  Surgeon: Sharma Covert, MD;  Location: [facility] OR;  Service: Orthopedics;  Laterality: Left;   NISSEN FUNDOPLICATION  1999   UPPER GI ENDOSCOPY  04/09/2007   Dr. Markham Jordan; Mucosal changes suspicious for Barretts esophagits   WOUND  EXPLORATION Left 10/18/2012   Procedure: WOUND EXPLORATION;  Surgeon: Sharma Covert, MD;  Location: [facility] OR;  Service: Orthopedics;  Laterality: Left;       Home Medications    Prior to Admission medications   Medication Sig Start Date End Date Taking? Authorizing Provider  amLODipine (NORVASC) 10 MG tablet TAKE 1 TABLET BY MOUTH EVERY DAY 11/08/22   Malva Limes, MD  aspirin 325 MG tablet Take 325 mg by mouth daily.  05/13/08   [provider]  baclofen (LIORESAL) 10 MG tablet Take 1 tablet (10 mg total) by mouth 3 (three) times daily as needed for muscle spasms. 10/13/22   Shirlee Latch, PA-C   cetirizine (ZYRTEC) 10 MG tablet TAKE 1 TABLET (10 MG TOTAL) BY MOUTH DAILY. FOR ALLERGIES 09/21/21   Malva Limes, MD  Cholecalciferol (VITAMIN D3 PO) Take by mouth daily.    [provider]  citalopram (CELEXA) 40 MG tablet TAKE 1 TABLET BY MOUTH EVERY DAY 11/08/22   Malva Limes, MD  clonazePAM (KLONOPIN) 0.5 MG tablet TAKE 1 TABLET (0.5 MG TOTAL) BY MOUTH EVERY 8 (EIGHT) HOURS AS NEEDED. FOR ANXIETY 05/20/22   Malva Limes, MD  colchicine 0.6 MG tablet TAKE 2 TABLETS ON FIRST DAY OF GOUT FLARE. 1 TAB 1 HOUR LATER. THEN, 1 TAB DAILY UNTIL RESOLVED. 05/05/22   Malva Limes, MD  Cyanocobalamin (VITAMIN B-12 PO) Take by mouth daily.    [provider]  famotidine (PEPCID) 20 MG tablet Take 1 tablet (20 mg total) by mouth 2 (two) times daily as needed for heartburn or indigestion (heartburn). 11/01/22   Malva Limes, MD  fluticasone [facility]) 50 MCG/ACT nasal spray SPRAY 2 SPRAYS INTO EACH NOSTRIL EVERY DAY 09/20/22   Malva Limes, MD  HYDROcodone-acetaminophen (NORCO) 10-325 MG tablet Take 1 tablet by mouth every 6 (six) hours as needed. 01/26/23   Malva Limes, MD  ibuprofen (ADVIL) 600 MG tablet Take 1 tablet (600 mg total) by mouth every 8 (eight) hours as needed for moderate pain. 06/06/22   Eusebio Friendly B, PA-C  ipratropium (ATROVENT) 0.06 % nasal spray Place 2 sprays into both nostrils 4 (four) times daily. 10/13/22   Eusebio Friendly B, PA-C  metoprolol succinate (TOPROL-XL) 50 MG 24 hr tablet TAKE 1 TABLET (50 MG TOTAL) BY MOUTH EVERY EVENING. TAKE WITH OR IMMEDIATELY FOLLOWING A MEAL. 01/15/23 04/09/24  Malva Limes, MD  omeprazole (PRILOSEC) 40 MG capsule TAKE 1 CAPSULE (40 MG TOTAL) BY MOUTH DAILY. 12/06/22   Malva Limes, MD  pravastatin (PRAVACHOL) 40 MG tablet TAKE 1 TABLET BY MOUTH EVERY DAY 06/23/22   Malva Limes, MD  sildenafil (REVATIO) 20 MG tablet TAKE 1 TO 2 TABLETS BY MOUTH DAILY 10/22/21   Malva Limes, MD  spironolactone  (ALDACTONE) 25 MG tablet TAKE 1 TABLET BY MOUTH EVERY DAY 05/07/22   Malva Limes, MD  valsartan-hydrochlorothiazide (DIOVAN-HCT) 160-25 MG tablet TAKE 1 TABLET BY MOUTH DAILY. TAKE IN PLACE OF ATACAND-HCTZ 01/15/23   Malva Limes, MD  Vitamin E 268 MG (400 UNIT) CAPS Take by mouth.    [provider]  POTASSIUM PO Take daily by mouth.  10/03/19  [provider]    Family History Family History  Problem Relation Age of Onset   Hypertension Sister    Bipolar disorder Sister    Heart attack Father 77   Emphysema Mother    Heart disease Mother  Diabetes Daughter 3   Bipolar disorder Brother    Hypertension Brother    Obesity Brother     Social History Social History   Tobacco Use   Smoking status: Every Day    Current packs/day: 0.50    Average packs/day: 0.5 packs/day for 5.0 years (2.5 ttl pk-yrs)    Types: Cigarettes   Smokeless tobacco: Never  Vaping Use   Vaping status: Never Used  Substance Use Topics   Alcohol use: Yes    Alcohol/week: 8.0 standard drinks of alcohol    Types: 8 Shots of liquor per week    Comment: 2 drinks 4 nights a week   Drug use: Not Currently    Comment: previously smoked marijuana years ago     Allergies   Hctz [hydrochlorothiazide]   Review of Systems Review of Systems: negative unless otherwise stated in HPI.      Physical Exam Triage Vital Signs ED Triage Vitals  Encounter Vitals Group     BP 02/02/23 0829 120/82     Systolic BP Percentile --      Diastolic BP Percentile --      Pulse Rate 02/02/23 0829 66     Resp 02/02/23 0829 18     Temp 02/02/23 0829 98 F (36.7 C)     Temp Source 02/02/23 0829 Oral     SpO2 02/02/23 0829 93 %     Weight --      Height --      Head Circumference --      Peak Flow --      Pain Score 02/02/23 0828 6     Pain Loc --      Pain Education --      Exclude from Growth Chart --    No data found.  Updated Vital Signs BP 120/82 (BP Location: Left Arm)   Pulse 66    Temp 98 F (36.7 C) (Oral)   Resp 18   SpO2 93%   Visual Acuity Right Eye Distance:   Left Eye Distance:   Bilateral Distance:    Right Eye Near:   Left Eye Near:    Bilateral Near:     Physical Exam GEN:     alert, non-toxic appearing male in no distress    HENT:  mucus membranes moist, oropharyngeal without lesions, mild erythema, no tonsils visible, clear nasal discharge, bilateral TM normal EYES:   no scleral injection or discharge NECK:  no lymphadenopathy,, midline C-spine tenderness (not new)  RESP:  no increased work of breathing, clear to auscultation bilaterally CVS:   regular rate and rhythm Skin:   warm and dry, no rash on visible skin    UC Treatments / Results  Labs (all labs ordered are listed, but only abnormal results are displayed) Labs Reviewed  RESP PANEL BY RT-PCR (RSV, FLU A&B, COVID)  [id number] - Abnormal; Notable for the following components:      Result Value   SARS Coronavirus 2 by RT PCR POSITIVE (*)    All other components within normal limits    EKG   Radiology No results found.  Procedures Procedures (including critical care time)  Medications Ordered in UC Medications - No data to display  Initial Impression / Assessment and Plan / UC Course  I have reviewed the triage vital signs and the nursing notes.  Pertinent labs & imaging results that were available during my care of the patient were reviewed by me and considered in my medical decision  making (see chart for details).       Pt is a 73 y.o. male who presents for 2 days of respiratory symptoms. Margus is afebrile here without recent antipyretics. Satting well on room air. Overall pt is non-toxic appearing, well hydrated, without respiratory distress. Pulmonary exam is unremarkable.  COVID, influenza and RSV panel obtained and was COVID  positive.  Discussed symptomatic treatment.  Explained lack of efficacy of antibiotics in viral disease.  Typical duration of symptoms  discussed.   Return and ED precautions given and voiced understanding. Discussed MDM, treatment plan and plan for follow-up with patient who agrees with plan.     Final Clinical Impressions(s) / UC Diagnoses   Final diagnoses:  COVID-19     Discharge Instructions      Your test for COVID-19 was positive, meaning that you were infected with the novel coronavirus and could give the germ to others.  The recommendations suggest returning to normal activities when, for at least 24 hours, symptoms are improving overall, and if a fever was present, it has been gone without use of a fever-reducing medication.  You should wear a mask for the next 5 days to prevent the spread of disease. Please continue good preventive care measures, including:  frequent hand-washing, avoid touching your face, cover coughs/sneezes, stay out of crowds and keep a 6 foot distance from others.  Go to the nearest hospital emergency room if fever/cough/breathlessness are severe or illness seems like a threat to life.  If your were prescribed medication. Stop by the pharmacy to pick it up. You can take Tylenol and/or Ibuprofen as needed for fever reduction and pain relief.    For cough: honey 1/2 to 1 teaspoon (you can dilute the honey in water or another fluid).  You can also use guaifenesin and dextromethorphan for cough. You can use a humidifier for chest congestion and cough.  If you don't have a humidifier, you can sit in the bathroom with the hot shower running.      For sore throat: try warm salt water gargles, Mucinex sore throat cough drops or cepacol lozenges, throat spray, warm tea or water with lemon/honey, popsicles or ice, or OTC cold relief medicine for throat discomfort. You can also purchase chloraseptic spray at the pharmacy or dollar store.   For congestion: take a daily anti-histamine like Zyrtec, Claritin, and a oral decongestant, such as pseudoephedrine.  You can also use Flonase 1-2 sprays in each  nostril daily. Afrin is also a good option, if you do not have high blood pressure.    It is important to stay hydrated: drink plenty of fluids (water, gatorade/powerade/pedialyte, juices, or teas) to keep your throat moisturized and help further relieve irritation/discomfort.    Return or go to the Emergency Department if symptoms worsen or do not improve in the next few days      ED Prescriptions   None    PDMP not reviewed this encounter.   Katha Cabal, DO 02/02/23 [phone]

## 2023-02-04 ENCOUNTER — Ambulatory Visit: Payer: Self-pay

## 2023-02-04 DIAGNOSIS — U071 COVID-19: Secondary | ICD-10-CM

## 2023-02-04 MED ORDER — NIRMATRELVIR/RITONAVIR (PAXLOVID)TABLET
3.0000 | ORAL_TABLET | Freq: Two times a day (BID) | ORAL | 0 refills | Status: AC
Start: 2023-02-04 — End: 2023-02-09

## 2023-02-04 NOTE — Telephone Encounter (Signed)
Patient notified and he expressed appreciation

## 2023-02-04 NOTE — Addendum Note (Signed)
Addended by: Mila Merry E on: 02/04/2023 12:40 PM   Modules accepted: Orders

## 2023-02-04 NOTE — Telephone Encounter (Signed)
Have sent [facility]lovid [facility]

## 2023-02-04 NOTE — Telephone Encounter (Signed)
  Chief Complaint: COVID positive  Symptoms: body aches, cough with congestion, runny nose, fever, sore throat  Frequency: Sunday Pertinent Negatives: Patient denies SOB Disposition: [] ED /[] Urgent Care (no appt availability in office) / [] Appointment(In office/virtual)/ []  [place] [facility]/ [] Home Care/ [] Refused Recommended Disposition /[] [place] Mobile Bus/ [x]  Follow-up with PCP Additional Notes: pt went to UC on 02/02/23, was tested and positive for COVID but they didn't offer him any medication for sx. Pt states he is feeling worse today. Asking if PCP can prescribe something for sx. Advised pt of care advise and since he had UC visit with positive results in chart would send to PCP for review. Pt would like FU when possible. [facility]   Summary: covid   [facility] and was diagnosed with Covid.  They did not give him any medication but he says he is still feeling bad. Please Advise  [phone]       Reason for Disposition  [1] HIGH RISK patient (e.g., weak immune system, age > 64 years, obesity with BMI 30 or higher, pregnant, chronic lung disease or other chronic medical condition) AND [2] COVID symptoms (e.g., cough, fever)  (Exceptions: Already seen by PCP and no new or worsening symptoms.)  Answer Assessment - Initial Assessment Questions 1. COVID-19 DIAGNOSIS: "How do you know that you have COVID?" (e.g., positive lab test or self-test, diagnosed by doctor or NP/PA, symptoms after exposure).     Positive at [facility] 02/02/23 2. COVID-19 EXPOSURE: "Was there any known exposure to COVID before the symptoms began?" CDC Definition of close contact: within 6 feet (2 meters) for a total of 15 minutes or more over a 24-hour period.      Several people at work have  3. ONSET: "When did the COVID-19 symptoms start?"      Sunday  5. COUGH: "Do you have a cough?" If Yes, ask: "How bad is the cough?"       yes 6.  FEVER: "Do you have a fever?" If Yes, ask: "What is your temperature, how was it measured, and when did it start?"     Low grade  7. RESPIRATORY STATUS: "Describe your breathing?" (e.g., normal; shortness of breath, wheezing, unable to speak)      no 8. BETTER-SAME-WORSE: "Are you getting better, staying the same or getting worse compared to yesterday?"  If getting worse, ask, "In what way?"     worse 9. OTHER SYMPTOMS: "Do you have any other symptoms?"  (e.g., chills, fatigue, headache, loss of smell or taste, muscle pain, sore throat)     Body aches, cough, congestion, runny nose, sore throat  10. HIGH RISK DISEASE: "Do you have any chronic medical problems?" (e.g., asthma, heart or lung disease, weak immune system, obesity, etc.)       HTN  Protocols used: Coronavirus (COVID-19) Diagnosed or Suspected-A-AH

## 2023-02-08 ENCOUNTER — Other Ambulatory Visit: Payer: Self-pay | Admitting: Family Medicine

## 2023-02-08 ENCOUNTER — Ambulatory Visit: Payer: Medicare Other | Admitting: Family Medicine

## 2023-02-08 DIAGNOSIS — G8929 Other chronic pain: Secondary | ICD-10-CM

## 2023-02-08 NOTE — Telephone Encounter (Signed)
Medication Refill -  Most Recent Primary Care Visit:  Provider: Malva Limes  Department: [facility]  Visit Type: OFFICE VISIT  Date: 10/15/2022  Medication: HYDROcodone-acetaminophen (NORCO) 10-325 MG tablet   Has the patient contacted their pharmacy? No, because of type of med  Is this the correct pharmacy for this prescription? yes  This is the patient's preferred pharmacy:  [phone] [place], [place] - [address] [place] [address] Phone: [phone] Fax: [phone]   Has the prescription been filled recently? no  Is the patient out of the medication? No, has some left until the weekend  Has the patient been seen for an appointment in the last year OR does the patient have an upcoming appointment? yes  Can we respond through MyChart? yes  Agent: Please be advised that Rx refills may take up to 3 business days. We ask that you follow-up with your pharmacy.

## 2023-02-09 MED ORDER — HYDROCODONE-ACETAMINOPHEN 10-325 MG PO TABS: 1.0000 | ORAL_TABLET | Freq: Four times a day (QID) | ORAL | 0 refills | Status: DC | PRN

## 2023-02-09 NOTE — Telephone Encounter (Signed)
Requested medication (s) are due for refill today- provider review   Requested medication (s) are on the active medication list -yes  Future visit scheduled -yes  Last refill: 01/26/23 #60  Notes to clinic: non delegated Rx  Requested Prescriptions  Pending Prescriptions Disp Refills   HYDROcodone-acetaminophen (NORCO) 10-325 MG tablet 60 tablet 0    Sig: Take 1 tablet by mouth every 6 (six) hours as needed.     Not Delegated - Analgesics:  Opioid Agonist Combinations Failed - 02/09/2023  9:55 AM      Failed - This refill cannot be delegated      Failed - Urine Drug Screen completed in last 360 days      Failed - Valid encounter within last 3 months    Recent Outpatient Visits           3 months ago Foraminal stenosis of cervical region   [facility] Malva Limes, MD   5 months ago RUQ pain   [place] [facility] [place], [place], PA-C   9 months ago Toe pain, right   [facility] Malva Limes, MD   1 year ago Enteritis   [place] [facility] [place], [place], PA-C   1 year ago Enteritis   [facility] Malva Limes, MD       Future Appointments             In 6 days Bacigalupo, Marzella Schlein, MD [facility]               Requested Prescriptions  Pending Prescriptions Disp Refills   HYDROcodone-acetaminophen (NORCO) 10-325 MG tablet 60 tablet 0    Sig: Take 1 tablet by mouth every 6 (six) hours as needed.     Not Delegated - Analgesics:  Opioid Agonist Combinations Failed - 02/09/2023  9:55 AM      Failed - This refill cannot be delegated      Failed - Urine Drug Screen completed in last 360 days      Failed - Valid encounter within last 3 months    Recent Outpatient Visits           3 months ago Foraminal stenosis of cervical region   [facility] Malva Limes, MD   5 months  ago RUQ pain   [place] [facility] [place], [place], PA-C   9 months ago Toe pain, right   [facility] Malva Limes, MD   1 year ago Enteritis   [place] [facility] [place], [place], PA-C   1 year ago Enteritis   [facility] Malva Limes, MD       Future Appointments             In 6 days Bacigalupo, Marzella Schlein, MD [facility]

## 2023-02-15 ENCOUNTER — Encounter: Payer: Self-pay | Admitting: Family Medicine

## 2023-02-15 ENCOUNTER — Ambulatory Visit (INDEPENDENT_AMBULATORY_CARE_PROVIDER_SITE_OTHER): Payer: Medicare Other | Admitting: Family Medicine

## 2023-02-15 VITALS — BP 111/71 | HR 74 | Ht 69.0 in | Wt 194.5 lb

## 2023-02-15 DIAGNOSIS — M5021 Other cervical disc displacement,  high cervical region: Secondary | ICD-10-CM | POA: Diagnosis not present

## 2023-02-15 DIAGNOSIS — M4802 Spinal stenosis, cervical region: Secondary | ICD-10-CM

## 2023-02-15 DIAGNOSIS — E559 Vitamin D deficiency, unspecified: Secondary | ICD-10-CM

## 2023-02-15 DIAGNOSIS — Z716 Tobacco abuse counseling: Secondary | ICD-10-CM

## 2023-02-15 DIAGNOSIS — G9339 Other post infection and related fatigue syndromes: Secondary | ICD-10-CM | POA: Diagnosis not present

## 2023-02-15 DIAGNOSIS — M542 Cervicalgia: Secondary | ICD-10-CM

## 2023-02-15 MED ORDER — BUPROPION HCL ER (SR) 150 MG PO TB12: 150.0000 mg | ORAL_TABLET | Freq: Two times a day (BID) | ORAL | 2 refills | Status: DC

## 2023-02-15 MED ORDER — BACLOFEN 10 MG PO TABS: 10.0000 mg | ORAL_TABLET | Freq: Three times a day (TID) | ORAL | 0 refills | Status: DC | PRN

## 2023-02-15 NOTE — Progress Notes (Signed)
 Established patient visit   Patient: Gary Carter   DOB: 05/15/1950   73 y.o. Male  MRN: [id number] Visit Date: 02/15/2023  Today's healthcare provider: Jon Eva, MD   Chief Complaint  Patient presents with   Smoking Cessation Screening    Pt reports he is smoking 1 pack every 2 days so half a pack a day. Reports he had quit for 30 years and started back 15 years ago when going through a divorce. Pt reports he went cold turkey the first time he stopped.   Referral    Pt reports he has been trying to see a spine specialist but everytime he has had an appointment something came up.  Was having neck pain and tried one of daughter muscle relaxants and it helped a lot and he would like to see if he could get a rx for one. Reports trying something that started with a B.   Fatigue    Reports still being fatigue post covid but would also like to check for vitamin deficiencies    Subjective    HPI HPI     Smoking Cessation Screening    Additional comments: Pt reports he is smoking 1 pack every 2 days so half a pack a day. Reports he had quit for 30 years and started back 15 years ago when going through a divorce. Pt reports he went cold turkey the first time he stopped.        Referral    Additional comments: Pt reports he has been trying to see a spine specialist but everytime he has had an appointment something came up.  Was having neck pain and tried one of daughter muscle relaxants and it helped a lot and he would like to see if he could get a rx for one. Reports trying something that started with a B.        Fatigue    Additional comments: Reports still being fatigue post covid but would also like to check for vitamin deficiencies       Last edited by Lilian Fitzpatrick, CMA on 02/15/2023  8:17 AM.       Discussed the use of AI scribe software for clinical note transcription with the patient, who gave verbal consent to proceed.  History of Present Illness    The patient, with a history of neck pain and recent COVID-19 infection, presents with fatigue and a desire to quit smoking. He reports feeling like he is dragging a little bit and wonders if his fatigue could be due to lingering effects of COVID-19 or a deficiency in vitamin B12. The patient also reports constant headaches and neck pain, which he says is alleviated by a muscle relaxer given to him by his daughter. He expresses interest in obtaining a muscle relaxer specifically for his neck pain.  In addition to these concerns, the patient expresses a desire to quit smoking. He has previously quit for 30 years and is considering using medication to assist with cessation this time. He mentions having used Chantix in the past and expresses interest in trying Wellbutrin .        Medications: Outpatient Medications Prior to Visit  Medication Sig   amLODipine  (NORVASC ) 10 MG tablet TAKE 1 TABLET BY MOUTH EVERY DAY   aspirin  325 MG tablet Take 325 mg by mouth daily.    cetirizine  (ZYRTEC ) 10 MG tablet TAKE 1 TABLET (10 MG TOTAL) BY MOUTH DAILY. FOR ALLERGIES   Cholecalciferol (VITAMIN D3  PO) Take by mouth daily.   citalopram  (CELEXA ) 40 MG tablet TAKE 1 TABLET BY MOUTH EVERY DAY   clonazePAM  (KLONOPIN ) 0.5 MG tablet TAKE 1 TABLET (0.5 MG TOTAL) BY MOUTH EVERY 8 (EIGHT) HOURS AS NEEDED. FOR ANXIETY   colchicine  0.6 MG tablet TAKE 2 TABLETS ON FIRST DAY OF GOUT FLARE. 1 TAB 1 HOUR LATER. THEN, 1 TAB DAILY UNTIL RESOLVED.   Cyanocobalamin  (VITAMIN B-12 PO) Take by mouth daily.   famotidine  (PEPCID ) 20 MG tablet Take 1 tablet (20 mg total) by mouth 2 (two) times daily as needed for heartburn or indigestion (heartburn).   fluticasone  (FLONASE ) 50 MCG/ACT nasal spray SPRAY 2 SPRAYS INTO EACH NOSTRIL EVERY DAY   HYDROcodone -acetaminophen  (NORCO) 10-325 MG tablet Take 1 tablet by mouth every 6 (six) hours as needed.   ibuprofen  (ADVIL ) 600 MG tablet Take 1 tablet (600 mg total) by mouth every 8 (eight)  hours as needed for moderate pain.   ipratropium (ATROVENT ) 0.06 % nasal spray Place 2 sprays into both nostrils 4 (four) times daily.   metoprolol  succinate (TOPROL -XL) 50 MG 24 hr tablet TAKE 1 TABLET (50 MG TOTAL) BY MOUTH EVERY EVENING. TAKE WITH OR IMMEDIATELY FOLLOWING A MEAL.   omeprazole  (PRILOSEC) 40 MG capsule TAKE 1 CAPSULE (40 MG TOTAL) BY MOUTH DAILY.   pravastatin  (PRAVACHOL ) 40 MG tablet TAKE 1 TABLET BY MOUTH EVERY DAY   sildenafil  (REVATIO ) 20 MG tablet TAKE 1 TO 2 TABLETS BY MOUTH DAILY   spironolactone  (ALDACTONE ) 25 MG tablet TAKE 1 TABLET BY MOUTH EVERY DAY   valsartan -hydrochlorothiazide  (DIOVAN -HCT) 160-25 MG tablet TAKE 1 TABLET BY MOUTH DAILY. TAKE IN PLACE OF ATACAND -HCTZ   Vitamin E 268 MG (400 UNIT) CAPS Take by mouth.   [DISCONTINUED] baclofen  (LIORESAL ) 10 MG tablet Take 1 tablet (10 mg total) by mouth 3 (three) times daily as needed for muscle spasms.   No facility-administered medications prior to visit.    Review of Systems     Objective    BP 111/71 (BP Location: Left Arm, Patient Position: Sitting, Cuff Size: Normal)   Pulse 74   Ht 5' 9 (1.753 m)   Wt 194 lb 8 oz (88.2 kg)   SpO2 97%   BMI 28.72 kg/m    Physical Exam Vitals reviewed.  Constitutional:      General: He is not in acute distress.    Appearance: Normal appearance. He is not diaphoretic.  HENT:     Head: Normocephalic and atraumatic.  Eyes:     General: No scleral icterus.    Conjunctiva/sclera: Conjunctivae normal.  Cardiovascular:     Rate and Rhythm: Normal rate and regular rhythm.     Heart sounds: Normal heart sounds. No murmur heard. Pulmonary:     Effort: Pulmonary effort is normal. No respiratory distress.     Breath sounds: Normal breath sounds. No wheezing or rhonchi.  Musculoskeletal:     Cervical back: Neck supple.     Right lower leg: No edema.     Left lower leg: No edema.  Lymphadenopathy:     Cervical: No cervical adenopathy.  Skin:    General: Skin is  warm and dry.     Findings: No rash.  Neurological:     Mental Status: He is alert and oriented to person, place, and time. Mental status is at baseline.  Psychiatric:        Mood and Affect: Mood normal.        Behavior: Behavior normal.  No results found for any visits on 02/15/23.  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 Symptoms include malaise and increased sleepiness. Differential diagnosis includes post-COVID syndrome, vitamin B12 deficiency, vitamin D  deficiency, anemia, and thyroid  dysfunction.  - Order blood tests for CBC, kidney and liver function, thyroid  function, vitamin B12, and vitamin D  levels.  Chronic Neck Pain Reports chronic neck pain with associated headaches, alleviated by baclofen . Discussed risks of combining baclofen  with hydrocodone  and clonazepam , including increased risk of falls and over-sedation. - Refill baclofen  10 mg, to be taken up to three times a day.  Referral to Neurosurgery Needs to reschedule neurosurgery appointment for neck issues. Previous appointments were canceled due to COVID-19 and family bereavement. - Call neurosurgeon's office to reschedule the appointment.  Smoking Cessation Desires to quit smoking.  Previously quit for 30 years and is interested in using Wellbutrin . Discussed Wellbutrin 's dosing, potential side effects, and the option of using nicotine patches or gum in conjunction with Wellbutrin . Recommended starting Wellbutrin  one week before the planned quit date and continuing for several months after quitting. - Prescribe Wellbutrin  150 mg, to be taken twice a day. - Recommend starting Wellbutrin  one week before the planned quit date. - Discuss the option of using nicotine patches or gum in conjunction with Wellbutrin  if needed. - Follow up with Dr. Gasper in a couple of months to monitor progress and make any necessary adjustments.        Return in about 2 months (around 04/15/2023) for With PCP.       Jon Eva, MD  [facility] [phone] (phone) [phone] (fax)  [facility]

## 2023-02-16 LAB — CBC WITH DIFFERENTIAL/PLATELET
Basophils Absolute: 0 10*3/uL (ref 0.0–0.2)
Basos: 0 %
EOS (ABSOLUTE): 0.2 10*3/uL (ref 0.0–0.4)
Eos: 2 %
Hematocrit: 44.5 % (ref 37.5–51.0)
Hemoglobin: 15 g/dL (ref 13.0–17.7)
Immature Grans (Abs): 0 10*3/uL (ref 0.0–0.1)
Immature Granulocytes: 0 %
Lymphocytes Absolute: 2.5 10*3/uL (ref 0.7–3.1)
Lymphs: 26 %
MCH: 33 pg (ref 26.6–33.0)
MCHC: 33.7 g/dL (ref 31.5–35.7)
MCV: 98 fL — ABNORMAL HIGH (ref 79–97)
Monocytes Absolute: 1 10*3/uL — ABNORMAL HIGH (ref 0.1–0.9)
Monocytes: 10 %
Neutrophils Absolute: 5.9 10*3/uL (ref 1.4–7.0)
Neutrophils: 62 %
Platelets: 239 10*3/uL (ref 150–450)
RBC: 4.54 x10E6/uL (ref 4.14–5.80)
RDW: 11.9 % (ref 11.6–15.4)
WBC: 9.6 10*3/uL (ref 3.4–10.8)

## 2023-02-16 LAB — COMPREHENSIVE METABOLIC PANEL
ALT: 27 [IU]/L (ref 0–44)
AST: 19 [IU]/L (ref 0–40)
Albumin: 4.4 g/dL (ref 3.9–4.9)
Alkaline Phosphatase: 57 [IU]/L (ref 44–121)
BUN/Creatinine Ratio: 25 — ABNORMAL HIGH (ref 10–24)
BUN: 26 mg/dL (ref 8–27)
Bilirubin Total: 0.6 mg/dL (ref 0.0–1.2)
CO2: 21 mmol/L (ref 20–29)
Calcium: 9.9 mg/dL (ref 8.6–10.2)
Chloride: 104 mmol/L (ref 96–106)
Creatinine, Ser: 1.04 mg/dL (ref 0.76–1.27)
Globulin, Total: 2.2 g/dL (ref 1.5–4.5)
Glucose: 112 mg/dL — ABNORMAL HIGH (ref 70–99)
Potassium: 4.8 mmol/L (ref 3.5–5.2)
Sodium: 140 mmol/L (ref 134–144)
Total Protein: 6.6 g/dL (ref 6.0–8.5)
eGFR: 77 mL/min/{1.73_m2} (ref >59)

## 2023-02-16 LAB — VITAMIN D 25 HYDROXY (VIT D DEFICIENCY, FRACTURES): Vit D, 25-Hydroxy: 27.6 ng/mL — ABNORMAL LOW (ref 30.0–100.0)

## 2023-02-16 LAB — TSH: TSH: 0.628 u[IU]/mL (ref 0.450–4.500)

## 2023-02-16 LAB — VITAMIN B12: Vitamin B-12: 588 pg/mL (ref 232–1245)

## 2023-02-17 ENCOUNTER — Encounter: Payer: Self-pay | Admitting: Family Medicine

## 2023-02-22 ENCOUNTER — Telehealth: Payer: Self-pay | Admitting: Family Medicine

## 2023-02-22 ENCOUNTER — Telehealth: Payer: Self-pay

## 2023-02-22 DIAGNOSIS — R300 Dysuria: Secondary | ICD-10-CM | POA: Diagnosis not present

## 2023-02-22 NOTE — Telephone Encounter (Signed)
Patient has been given lab results. Please see other phone encounter.

## 2023-02-22 NOTE — Telephone Encounter (Signed)
Copied from CRM [id number]. Topic: General - Other >> Feb 21, 2023  4:42 PM Gary Carter wrote: Reason for CRM: Pt called for lab results. Pt requests call back to go over his most recent ab results. Cb# [phone]

## 2023-02-22 NOTE — Telephone Encounter (Signed)
Pt. Given lab results and instructions, Verbalizes understanding.

## 2023-02-23 ENCOUNTER — Encounter: Payer: Self-pay | Admitting: Family Medicine

## 2023-02-23 ENCOUNTER — Ambulatory Visit (INDEPENDENT_AMBULATORY_CARE_PROVIDER_SITE_OTHER): Payer: Medicare Other | Admitting: Family Medicine

## 2023-02-23 VITALS — BP 131/93 | HR 67 | Resp 16 | Ht 69.0 in | Wt 197.0 lb

## 2023-02-23 DIAGNOSIS — M5441 Lumbago with sciatica, right side: Secondary | ICD-10-CM

## 2023-02-23 DIAGNOSIS — Z125 Encounter for screening for malignant neoplasm of prostate: Secondary | ICD-10-CM | POA: Diagnosis not present

## 2023-02-23 DIAGNOSIS — R300 Dysuria: Secondary | ICD-10-CM

## 2023-02-23 DIAGNOSIS — N3000 Acute cystitis without hematuria: Secondary | ICD-10-CM | POA: Diagnosis not present

## 2023-02-23 DIAGNOSIS — G8929 Other chronic pain: Secondary | ICD-10-CM | POA: Diagnosis not present

## 2023-02-23 LAB — POCT URINALYSIS DIPSTICK
Bilirubin, UA: NEGATIVE
Blood, UA: NEGATIVE
Glucose, UA: NEGATIVE
Ketones, UA: NEGATIVE
Nitrite, UA: NEGATIVE
Odor: NEGATIVE
Protein, UA: NEGATIVE
Spec Grav, UA: 1.015 (ref 1.010–1.025)
Urobilinogen, UA: 0.2 U/dL
pH, UA: 6 (ref 5.0–8.0)

## 2023-02-23 MED ORDER — HYDROCODONE-ACETAMINOPHEN 10-325 MG PO TABS: 1.0000 | ORAL_TABLET | Freq: Four times a day (QID) | ORAL | 0 refills | Status: DC | PRN

## 2023-02-23 MED ORDER — SULFAMETHOXAZOLE-TRIMETHOPRIM 800-160 MG PO TABS: 1.0000 | ORAL_TABLET | Freq: Two times a day (BID) | ORAL | 0 refills | Status: AC

## 2023-02-23 NOTE — Progress Notes (Signed)
Established patient visit   Patient: Gary Carter   DOB: [birth year]   73 y.o. Male  MRN: [id number] Visit Date: 02/23/2023  Today's healthcare provider: Mila Merry, MD   Chief Complaint  Patient presents with   Dysuria    Frequency: last week (Wednesday or Thursday) Symptoms: Burning, painful urination and lower abdominal pain.   Subjective    Discussed the use of AI scribe software for clinical note transcription with the patient, who gave verbal consent to proceed.  History of Present Illness   KWESI SANGHA is a 73 year old male who presents with symptoms suggestive of a bladder infection.  He has been experiencing a sensation of fullness in the bladder area and dysuria for approximately one week. No fevers, chills, or sweats have been noted. He denies any issues with emptying his bladder prior to the onset of these symptoms and states that his urinary stream remains 'pretty good', although it 'stains and everything'.  He is currently taking bupropion for smoking cessation and a muscle relaxer for neck pain. He questions whether these medications could be related to his symptoms.  He has a history of one previous bladder infection or UTI in his lifetime. He is not allergic to antibiotics but notes that some antibiotics have caused diarrhea in the past. He has not taken sulfa antibiotics like Septra or Bactrim before. He is allergic to hydrochlorothiazide, which caused leg cramps.       Medications: Outpatient Medications Prior to Visit  Medication Sig   amLODipine (NORVASC) 10 MG tablet TAKE 1 TABLET BY MOUTH EVERY DAY   aspirin 325 MG tablet Take 325 mg by mouth daily.    baclofen (LIORESAL) 10 MG tablet Take 1 tablet (10 mg total) by mouth 3 (three) times daily as needed for muscle spasms.   buPROPion (WELLBUTRIN SR) 150 MG 12 hr tablet Take 1 tablet (150 mg total) by mouth 2 (two) times daily.   cetirizine (ZYRTEC) 10 MG tablet TAKE 1 TABLET (10 MG TOTAL) BY  MOUTH DAILY. FOR ALLERGIES   Cholecalciferol (VITAMIN D3 PO) Take by mouth daily.   citalopram (CELEXA) 40 MG tablet TAKE 1 TABLET BY MOUTH EVERY DAY   clonazePAM (KLONOPIN) 0.5 MG tablet TAKE 1 TABLET (0.5 MG TOTAL) BY MOUTH EVERY 8 (EIGHT) HOURS AS NEEDED. FOR ANXIETY   colchicine 0.6 MG tablet TAKE 2 TABLETS ON FIRST DAY OF GOUT FLARE. 1 TAB 1 HOUR LATER. THEN, 1 TAB DAILY UNTIL RESOLVED.   Cyanocobalamin (VITAMIN B-12 PO) Take by mouth daily.   famotidine (PEPCID) 20 MG tablet Take 1 tablet (20 mg total) by mouth 2 (two) times daily as needed for heartburn or indigestion (heartburn).   fluticasone (FLONASE) 50 MCG/ACT nasal spray SPRAY 2 SPRAYS INTO EACH NOSTRIL EVERY DAY   ibuprofen (ADVIL) 600 MG tablet Take 1 tablet (600 mg total) by mouth every 8 (eight) hours as needed for moderate pain.   ipratropium (ATROVENT) 0.06 % nasal spray Place 2 sprays into both nostrils 4 (four) times daily.   metoprolol succinate (TOPROL-XL) 50 MG 24 hr tablet TAKE 1 TABLET (50 MG TOTAL) BY MOUTH EVERY EVENING. TAKE WITH OR IMMEDIATELY FOLLOWING A MEAL.   omeprazole (PRILOSEC) 40 MG capsule TAKE 1 CAPSULE (40 MG TOTAL) BY MOUTH DAILY.   pravastatin (PRAVACHOL) 40 MG tablet TAKE 1 TABLET BY MOUTH EVERY DAY   sildenafil (REVATIO) 20 MG tablet TAKE 1 TO 2 TABLETS BY MOUTH DAILY   spironolactone (ALDACTONE) 25  MG tablet TAKE 1 TABLET BY MOUTH EVERY DAY   valsartan-hydrochlorothiazide (DIOVAN-HCT) 160-25 MG tablet TAKE 1 TABLET BY MOUTH DAILY. TAKE IN PLACE OF ATACAND-HCTZ   Vitamin E 268 MG (400 UNIT) CAPS Take by mouth.   HYDROcodone-acetaminophen (NORCO) 10-325 MG tablet Take 1 tablet by mouth every 6 (six) hours as needed.   No facility-administered medications prior to visit.   Review of Systems     Objective    BP (!) 131/93 (BP Location: Left Arm, Patient Position: Sitting, Cuff Size: Normal)   Pulse 67   Resp 16   Ht 5\' 9"  (1.753 m)   Wt 197 lb (89.4 kg)   BMI 29.09 kg/m   Physical Exam   General appearance: Well developed, well nourished male, cooperative and in no acute distress Head: Normocephalic, without obvious abnormality, atraumatic Respiratory: Respirations even and unlabored, normal respiratory rate Extremities: All extremities are intact.  Skin: Skin color, texture, turgor normal. No rashes seen  Psych: Appropriate mood and affect. Neurologic: Mental status: Alert, oriented to person, place, and time, thought content appropriate.     Results for orders placed or performed in visit on 02/23/23  POCT urinalysis dipstick  Result Value Ref Range   Color, UA Yellow    Clarity, UA Clear    Glucose, UA Negative Negative   Bilirubin, UA Negative    Ketones, UA Negative    Spec Grav, UA 1.015 1.010 - 1.025   Blood, UA Negative    pH, UA 6.0 5.0 - 8.0   Protein, UA Negative Negative   Urobilinogen, UA 0.2 0.2 or 1.0 E.U./dL   Nitrite, UA Negative    Leukocytes, UA Small (1+) (A) Negative   Appearance     Odor Negative     Assessment & Plan       Urinary Tract Infection Reports dysuria and feeling of fullness in the bladder for the past week. Urinalysis suggestive of infection. -Start Bactrim DS BID -Send urine for culture and sensitivity.  Prostate Health No prior history of urinary retention or difficulty emptying bladder. Due for PSA screening. -Order PSA level.  Pain Management Refill of hydrocodone/apap         Mila Merry, MD  [facility] [phone] (phone) [phone] (fax)  [facility]

## 2023-02-24 LAB — PSA TOTAL (REFLEX TO FREE): Prostate Specific Ag, Serum: 1.4 ng/mL (ref 0.0–4.0)

## 2023-02-25 ENCOUNTER — Telehealth: Payer: Self-pay | Admitting: *Deleted

## 2023-02-25 LAB — URINE CULTURE: Organism ID, Bacteria: NO GROWTH

## 2023-02-25 NOTE — Telephone Encounter (Signed)
Patient called for results and notified: Urine culture is negative and PSA is normal. Let me know if UTI sx not resolved by the time he finishes the antibiotic.

## 2023-03-03 NOTE — Telephone Encounter (Signed)
 Copied from CRM [id number]. Topic: Clinical - Medication Question >> Mar 03, 2023  2:44 PM Phill Myron wrote: Patient currently taking baclofen (LIORESAL) 10 MG tablet would like to change to a different medication .  Please advise

## 2023-03-04 ENCOUNTER — Ambulatory Visit: Payer: Self-pay | Admitting: Family Medicine

## 2023-03-04 NOTE — Telephone Encounter (Signed)
 Pt states, that the baclofen helps with his neck pain, however, he was having UTI s/s with it. Pt states he has had negative w/u for the UTI, and believes that being caused by the baclofen. Pt denies changes with neck pain. Pt states that he is willing to try any other muscle relaxer that would be good for neck pain.   Copied from CRM [id number]. Topic: Clinical - Medication Question >> Mar 04, 2023  3:02 PM Kristie Cowman wrote: Reason for CRM: Patient states baclofen is not agreeing with him and would like a different muscle relaxer called in for him. Reason for Disposition . [1] Caller requesting NON-URGENT health information AND [2] PCP's office is the best resource  Answer Assessment - Initial Assessment Questions 1. REASON FOR CALL or QUESTION: "What is your reason for calling today?" or "How can I best help you?" or "What question do you have that I can help answer?"     Switch to different muscle relaxer  Protocols used: Information Only Call - No Triage-A-AH

## 2023-03-05 ENCOUNTER — Other Ambulatory Visit: Payer: Self-pay | Admitting: Family Medicine

## 2023-03-07 DIAGNOSIS — M47812 Spondylosis without myelopathy or radiculopathy, cervical region: Secondary | ICD-10-CM | POA: Diagnosis not present

## 2023-03-08 ENCOUNTER — Ambulatory Visit (INDEPENDENT_AMBULATORY_CARE_PROVIDER_SITE_OTHER): Payer: Medicare Other

## 2023-03-08 DIAGNOSIS — Z0000 Encounter for general adult medical examination without abnormal findings: Secondary | ICD-10-CM

## 2023-03-08 NOTE — Progress Notes (Signed)
 Subjective:   Gary Carter is a 73 y.o. who presents for a Medicare Wellness preventive visit.  Visit Complete: Virtual I connected with  Gary Carter on 03/08/23 by a audio enabled telemedicine application and verified that I am speaking with the correct person using two identifiers.  Patient Location: Home  Provider Location: Office/Clinic  I discussed the limitations of evaluation and management by telemedicine. The patient expressed understanding and agreed to proceed.  Vital Signs: Because this visit was a virtual/telehealth visit, some criteria may be missing or patient reported. Any vitals not documented were not able to be obtained and vitals that have been documented are patient reported.  VideoDeclined- This patient declined Librarian, academic. Therefore the visit was completed with audio only.  AWV Questionnaire: No: Patient Medicare AWV questionnaire was not completed prior to this visit.  Cardiac Risk Factors include: advanced age (>77men, >22 women);dyslipidemia;male gender;hypertension     Objective:    Today's Vitals   03/08/23 1204  PainSc: 4    There is no height or weight on file to calculate BMI.     03/08/2023   12:11 PM 02/02/2023    8:29 AM 06/06/2022   11:44 AM 03/03/2022    9:07 AM 12/26/2021    5:05 AM 02/12/2021   11:08 AM 03/22/2020    8:08 AM  Advanced Directives  Does Patient Have a Medical Advance Directive? No Yes Yes Yes No No No  Type of Best boy of [place];Living will Healthcare Power of Attorney     Copy of Healthcare Power of Attorney in Chart?    No - copy requested     Would patient like information on creating a medical advance directive? No - Patient declined     No - Patient declined     Current Medications (verified) Outpatient Encounter Medications as of 03/08/2023  Medication Sig   amLODipine (NORVASC) 10 MG tablet TAKE 1 TABLET BY MOUTH EVERY DAY   aspirin 325 MG  tablet Take 325 mg by mouth daily.    buPROPion (WELLBUTRIN SR) 150 MG 12 hr tablet Take 1 tablet (150 mg total) by mouth 2 (two) times daily.   cetirizine (ZYRTEC) 10 MG tablet TAKE 1 TABLET (10 MG TOTAL) BY MOUTH DAILY. FOR ALLERGIES   Cholecalciferol (VITAMIN D3 PO) Take by mouth daily.   citalopram (CELEXA) 40 MG tablet TAKE 1 TABLET BY MOUTH EVERY DAY   clonazePAM (KLONOPIN) 0.5 MG tablet TAKE 1 TABLET (0.5 MG TOTAL) BY MOUTH EVERY 8 (EIGHT) HOURS AS NEEDED. FOR ANXIETY   Cyanocobalamin (VITAMIN B-12 PO) Take by mouth daily.   fluticasone (FLONASE) 50 MCG/ACT nasal spray SPRAY 2 SPRAYS INTO EACH NOSTRIL EVERY DAY   HYDROcodone-acetaminophen (NORCO) 10-325 MG tablet Take 1 tablet by mouth every 6 (six) hours as needed.   ibuprofen (ADVIL) 600 MG tablet Take 1 tablet (600 mg total) by mouth every 8 (eight) hours as needed for moderate pain.   metoprolol succinate (TOPROL-XL) 50 MG 24 hr tablet TAKE 1 TABLET (50 MG TOTAL) BY MOUTH EVERY EVENING. TAKE WITH OR IMMEDIATELY FOLLOWING A MEAL.   omeprazole (PRILOSEC) 40 MG capsule TAKE 1 CAPSULE (40 MG TOTAL) BY MOUTH DAILY.   pravastatin (PRAVACHOL) 40 MG tablet TAKE 1 TABLET BY MOUTH EVERY DAY   sildenafil (REVATIO) 20 MG tablet TAKE 1 TO 2 TABLETS BY MOUTH DAILY   valsartan-hydrochlorothiazide (DIOVAN-HCT) 160-25 MG tablet TAKE 1 TABLET BY MOUTH DAILY. TAKE IN PLACE OF  ATACAND-HCTZ   Vitamin E 268 MG (400 UNIT) CAPS Take by mouth.   baclofen (LIORESAL) 10 MG tablet Take 1 tablet (10 mg total) by mouth 3 (three) times daily as needed for muscle spasms. (Patient not taking: Reported on 03/08/2023)   colchicine 0.6 MG tablet TAKE 2 TABLETS ON FIRST DAY OF GOUT FLARE. 1 TAB 1 HOUR LATER. THEN, 1 TAB DAILY UNTIL RESOLVED. (Patient not taking: Reported on 03/08/2023)   famotidine (PEPCID) 20 MG tablet Take 1 tablet (20 mg total) by mouth 2 (two) times daily as needed for heartburn or indigestion (heartburn). (Patient not taking: Reported on 03/08/2023)    ipratropium (ATROVENT) 0.06 % nasal spray Place 2 sprays into both nostrils 4 (four) times daily. (Patient not taking: Reported on 03/08/2023)   spironolactone (ALDACTONE) 25 MG tablet TAKE 1 TABLET BY MOUTH EVERY DAY   [EXPIRED] sulfamethoxazole-trimethoprim (BACTRIM DS) 800-160 MG tablet Take 1 tablet by mouth 2 (two) times daily for 10 days.   [DISCONTINUED] amLODipine (NORVASC) 10 MG tablet TAKE 1 TABLET BY MOUTH EVERY DAY   [DISCONTINUED] citalopram (CELEXA) 40 MG tablet TAKE 1 TABLET BY MOUTH EVERY DAY   [DISCONTINUED] POTASSIUM PO Take daily by mouth.   [DISCONTINUED] spironolactone (ALDACTONE) 25 MG tablet TAKE 1 TABLET BY MOUTH EVERY DAY   No facility-administered encounter medications on file as of 03/08/2023.    Allergies (verified) Hctz [hydrochlorothiazide]   History: Past Medical History:  Diagnosis Date   Anxiety    Barrett's esophagus    GERD (gastroesophageal reflux disease)    History of chicken pox 06/12/2014   DID have Chicken Pox. DID have Measles. DID have Mumps. DID have Rubella.     History of dysphagia    History of fatty infiltration of liver    History of measles    History of mumps    History of rubella    Hyperlipidemia    Hypertension    Sleep apnea    SOB (shortness of breath)    Past Surgical History:  Procedure Laterality Date   ABDOMINAL SURGERY     BACK SURGERY  12-04-2003   Lumbar fusion & C5-C6 fusion   ESOPHAGOGASTRODUODENOSCOPY (EGD) WITH PROPOFOL N/A 07/10/2018   Procedure: ESOPHAGOGASTRODUODENOSCOPY (EGD) WITH PROPOFOL;  Surgeon: Scot Jun, MD;  Location[facility] END[facility]COPY;  Service: Endoscopy;  Laterality: N/A;   HERNIA REPAIR     Myocardial perfusion scan  10/10/2006   NECK SURGERY     NERVE, TENDON AND ARTERY REPAIR Left 10/18/2012   Procedure: NERVE, TENDON AND ARTERY REPAIR;  Surgeon: Sharma Covert, MD;  Location: [facility] OR;  Service: Orthopedics;  Laterality: Left;   NISSEN FUNDOPLICATION  1999   UPPER GI ENDOSCOPY  04/09/2007    Dr. Markham Jordan; Mucosal changes suspicious for Barretts esophagits   WOUND EXPLORATION Left 10/18/2012   Procedure: WOUND EXPLORATION;  Surgeon: Sharma Covert, MD;  Location: [facility] OR;  Service: Orthopedics;  Laterality: Left;   Family History  Problem Relation Age of Onset   Hypertension Sister    Bipolar disorder Sister    Heart attack Father 42   Emphysema Mother    Heart disease Mother    Diabetes Daughter 3   Bipolar disorder Brother    Hypertension Brother    Obesity Brother    Social History   Socioeconomic History   Marital status: Divorced    Spouse name: Not on file   Number of children: 1   Years of education: Not on file   Highest  education level: High school graduate  Occupational History   Occupation: Works at [employer]  Tobacco Use   Smoking status: Every Day    Current packs/day: 0.50    Average packs/day: 0.5 packs/day for 5.0 years (2.5 ttl pk-yrs)    Types: Cigarettes   Smokeless tobacco: Never  Vaping Use   Vaping status: Never Used  Substance and Sexual Activity   Alcohol use: Yes    Alcohol/week: 8.0 standard drinks of alcohol    Types: 8 Shots of liquor per week    Comment: 2 drinks 4 nights a week   Drug use: Not Currently    Comment: previously smoked marijuana years ago   Sexual activity: Not on file  Other Topics Concern   Not on file  Social History Narrative   Not on file   Social Drivers of Health   Financial Resource Strain: Low Risk  (03/08/2023)   Overall Financial Resource Strain (CARDIA)    Difficulty of Paying Living Expenses: Not hard at all  Food Insecurity: No Food Insecurity (03/08/2023)   Hunger Vital Sign    Worried About Running Out of Food in the Last Year: Never true    Ran Out of Food in the Last Year: Never true  Transportation Needs: No Transportation Needs (03/08/2023)   PRAPARE - Administrator, Civil Service (Medical): No    Lack of Transportation (Non-Medical): No  Physical Activity: Sufficiently Active  (03/08/2023)   Exercise Vital Sign    Days of Exercise per Week: 5 days    Minutes of Exercise per Session: 60 min  Stress: No Stress Concern Present (03/08/2023)   [employer] of Occupational Health - Occupational Stress Questionnaire    Feeling of Stress : Not at all  Social Connections: Socially Isolated (03/08/2023)   Social Connection and Isolation Panel [NHANES]    Frequency of Communication with Friends and Family: More than three times a week    Frequency of Social Gatherings with Friends and Family: More than three times a week    Attends Religious Services: Never    Database administrator or Organizations: No    Attends Engineer, structural: Never    Marital Status: Divorced    Tobacco Counseling Ready to quit: Not Answered Counseling given: Not Answered    Clinical Intake:  Pre-visit preparation completed: Yes  Pain : 0-10 Pain Score: 4  Pain Type: Chronic pain Pain Location: Neck Pain Descriptors / Indicators: Aching Pain Onset: More than a month ago Pain Frequency: Constant     BMI - recorded: 29.1 Nutritional Status: BMI 25 -29 Overweight Nutritional Risks: None Diabetes: No  How often do you need to have someone help you when you read instructions, pamphlets, or other written materials from your doctor or pharmacy?: 1 - Never  Interpreter Needed?: No  Information entered by :: Kennedy Bucker, LPN   Activities of Daily Living     03/08/2023   12:12 PM 04/27/2022    8:37 AM  In your present state of health, do you have any difficulty performing the following activities:  Hearing? 0 0  Vision? 0 0  Difficulty concentrating or making decisions? 0 0  Walking or climbing stairs? 0 0  Dressing or bathing? 0 0  Doing errands, shopping? 0 0  Preparing Food and eating ? N   Using the Toilet? N   In the past six months, have you accidently leaked urine? N   Do you have problems with loss  of bowel control? N   Managing your Medications? N    Managing your Finances? N   Housekeeping or managing your Housekeeping? N     Patient Care Team: Malva Limes, MD as PCP - General (Family Medicine) Marlene Bast [facility])  Indicate any recent Medical Services you may have received from other than [facility] providers in the past year (date may be approximate).     Assessment:   This is a routine wellness examination for Niam.  Hearing/Vision screen Hearing Screening - Comments:: NO AIDS Vision Screening - Comments:: WEARS CONTACTS/GLASSES- DR.THURMOND   Goals Addressed             This Visit's Progress    DIET - INCREASE WATER INTAKE         Depression Screen     03/08/2023   12:09 PM 02/23/2023    8:47 AM 02/15/2023    8:14 AM 10/15/2022   10:18 AM 09/10/2022    8:25 AM 04/27/2022    8:37 AM 03/03/2022    8:58 AM  PHQ 2/9 Scores  PHQ - 2 Score 0 0 0 0 0 0 0  PHQ- 9 Score 0 0 0 1 0 0 0    Fall Risk     03/08/2023   12:11 PM 02/23/2023    8:47 AM 02/15/2023    8:14 AM 10/15/2022   10:18 AM 09/10/2022    8:25 AM  Fall Risk   Falls in the past year? 0 0 0 0 0  Number falls in past yr: 0 0 0 0 0  Injury with Fall? 0 0 0 0 0  Risk for fall due to : No Fall Risks No Fall Risks No Fall Risks  No Fall Risks  Follow up Falls prevention discussed;Falls evaluation completed  Falls evaluation completed  Falls prevention discussed;Education provided;Falls evaluation completed    MEDICARE RISK AT HOME:  Medicare Risk at Home Any stairs in or around the home?: Yes If so, are there any without handrails?: No Home free of loose throw rugs in walkways, pet beds, electrical cords, etc?: Yes Adequate lighting in your home to reduce risk of falls?: Yes Life alert?: No Use of a cane, walker or w/c?: No Grab bars in the bathroom?: No Shower chair or bench in shower?: Yes Elevated toilet seat or a handicapped toilet?: No  TIMED UP AND GO:  Was the test performed?  No  Cognitive Function: 6CIT completed         03/08/2023   12:13 PM 03/03/2022    9:03 AM  6CIT Screen  What Year? 0 points 0 points  What month? 0 points 0 points  What time? 0 points 0 points  Count back from 20 0 points 0 points  Months in reverse 4 points 0 points  Repeat phrase 2 points 0 points  Total Score 6 points 0 points    Immunizations Immunization History  Administered Date(s) Administered   Fluad Quad(high Dose 65+) 11/27/2019, 09/16/2020, 10/01/2021   Fluad Trivalent(High Dose 65+) 09/10/2022   Influenza Split 10/08/2011   Influenza, High Dose Seasonal PF 09/16/2017   Influenza,inj,Quad PF,6+ Mos 10/09/2013, 09/14/2016   Influenza-Unspecified 10/29/2014, 09/16/2017, 10/01/2018   Moderna Covid-19 Fall Seasonal Vaccine 60yrs & older 10/01/2021   Moderna Sars-Covid-2 Vaccination 02/23/2019, 03/23/2019, 11/27/2019   Pneumococcal Conjugate-13 11/15/2017   Pneumococcal Polysaccharide-23 10/08/2011, 05/18/2019   Tdap 10/08/2011   Zoster Recombinant(Shingrix) 02/07/2017, 04/21/2017   Zoster, Live 10/09/2013    Screening Tests Health Maintenance  Topic Date Due   DTaP/Tdap/Td (2 - Td or Tdap) 10/07/2021   COVID-19 Vaccine (5 - 2024-25 season) 09/12/2022   Medicare Annual Wellness (AWV)  03/07/2024   Colonoscopy  03/07/2031   Pneumonia Vaccine 36+ Years old  Completed   INFLUENZA VACCINE  Completed   Hepatitis C Screening  Completed   Zoster Vaccines- Shingrix  Completed   HPV VACCINES  Aged Out    Health Maintenance  Health Maintenance Due  Topic Date Due   DTaP/Tdap/Td (2 - Td or Tdap) 10/07/2021   COVID-19 Vaccine (5 - 2024-25 season) 09/12/2022   Health Maintenance Items Addressed: UP TO DATE  Additional Screening:  Vision Screening: Recommended annual ophthalmology exams for early detection of glaucoma and other disorders of the eye.  Dental Screening: Recommended annual dental exams for proper oral hygiene  Community Resource Referral / Chronic Care Management: CRR required this visit?  No    CCM required this visit?  No     Plan:     I have personally reviewed and noted the following in the patient's chart:   Medical and social history Use of alcohol, tobacco or illicit drugs  Current medications and supplements including opioid prescriptions. Patient is currently taking opioid prescriptions. Information provided to patient regarding non-opioid alternatives. Patient advised to discuss non-opioid treatment plan with their provider. Functional ability and status Nutritional status Physical activity Advanced directives List of other physicians Hospitalizations, surgeries, and ER visits in previous 12 months Vitals Screenings to include cognitive, depression, and falls Referrals and appointments  In addition, I have reviewed and discussed with patient certain preventive protocols, quality metrics, and best practice recommendations. A written personalized care plan for preventive services as well as general preventive health recommendations were provided to patient.     Hal Hope, LPN   5/40/9811   After Visit Summary: (MyChart) Due to this being a telephonic visit, the after visit summary with patients personalized plan was offered to patient via MyChart   Notes: Nothing significant to report at this time.

## 2023-03-08 NOTE — Patient Instructions (Addendum)
 Gary Carter , Thank you for taking time to come for your Medicare Wellness Visit. I appreciate your ongoing commitment to your health goals. Please review the following plan we discussed and let me know if I can assist you in the future.   Referrals/Orders/Follow-Ups/Clinician Recommendations: NONE  This is a list of the screening recommended for you and due dates:  Health Maintenance  Topic Date Due   DTaP/Tdap/Td vaccine (2 - Td or Tdap) 10/07/2021   COVID-19 Vaccine (5 - 2024-25 season) 09/12/2022   Medicare Annual Wellness Visit  03/07/2024   Colon Cancer Screening  03/07/2031   Pneumonia Vaccine  Completed   Flu Shot  Completed   Hepatitis C Screening  Completed   Zoster (Shingles) Vaccine  Completed   HPV Vaccine  Aged Out    Advanced directives: (ACP Link)Information on Advanced Care Planning can be found at [facility] Advance Health Care Directives Advance Health Care Directives ([email or web address]   Next Medicare Annual Wellness Visit scheduled for next year: Yes   03/13/24 @ 10:50 AM BY PHONE

## 2023-03-09 ENCOUNTER — Other Ambulatory Visit: Payer: Self-pay | Admitting: Family Medicine

## 2023-03-09 DIAGNOSIS — G8929 Other chronic pain: Secondary | ICD-10-CM

## 2023-03-09 NOTE — Telephone Encounter (Unsigned)
 Copied from CRM [id number]. Topic: Clinical - Medication Refill >> Mar 09, 2023 10:51 AM Elmarie Shiley B wrote: Most Recent Primary Care Visit:  Provider: Malva Limes  Department: [facility]  Visit Type: OFFICE VISIT  Date: 10/15/2022  Medication: HYDROcodone-acetaminophen (NORCO) 10-325 MG tablet  Has the patient contacted their pharmacy? No, Caller states he was advised to call PCP office because its a pain medication   Is this the correct pharmacy for this prescription? Yes  This is the patient's preferred pharmacy:  [phone] Dan Humphreys, [place] - [address] [place] [address] Phone: [phone] Fax: [phone]   Has the prescription been filled recently? Yes  Is the patient out of the medication? Yes  Has the patient been seen for an appointment in the last year OR does the patient have an upcoming appointment? Yes  Can we respond through MyChart? No  Agent: Please be advised that Rx refills may take up to 3 business days. We ask that you follow-up with your pharmacy.

## 2023-03-10 ENCOUNTER — Other Ambulatory Visit: Payer: Self-pay | Admitting: Family Medicine

## 2023-03-10 DIAGNOSIS — Z716 Tobacco abuse counseling: Secondary | ICD-10-CM

## 2023-03-10 MED ORDER — HYDROCODONE-ACETAMINOPHEN 10-325 MG PO TABS: 1.0000 | ORAL_TABLET | Freq: Four times a day (QID) | ORAL | 0 refills | Status: DC | PRN

## 2023-03-11 NOTE — Telephone Encounter (Signed)
 Requested medication (s) are due for refill today: yes   Requested medication (s) are on the active medication list: yes   Last refill:  02/15/23 #60 2 refills  Future visit scheduled: yes in 1 month   Notes to clinic:  Pharmacy comment: REQUEST FOR 90 DAYS PRESCRIPTION. DX Code Needed.  Do you want to refill Rx?     Requested Prescriptions  Pending Prescriptions Disp Refills   buPROPion (WELLBUTRIN SR) 150 MG 12 hr tablet [Pharmacy Med Name: BUPROPION HCL SR 150 MG TABLET] 180 tablet 1    Sig: TAKE 1 TABLET BY MOUTH TWICE A DAY     Psychiatry: Antidepressants - bupropion Failed - 03/11/2023 10:54 AM      Failed - Last BP in normal range    BP Readings from Last 1 Encounters:  02/23/23 (!) 131/93         Passed - Cr in normal range and within 360 days    Creatinine, Ser  Date Value Ref Range Status  02/15/2023 1.04 0.76 - 1.27 mg/dL Final         Passed - AST in normal range and within 360 days    AST  Date Value Ref Range Status  02/15/2023 19 0 - 40 IU/L Final         Passed - ALT in normal range and within 360 days    ALT  Date Value Ref Range Status  02/15/2023 27 0 - 44 IU/L Final         Passed - Completed PHQ-2 or PHQ-9 in the last 360 days      Passed - Valid encounter within last 6 months    Recent Outpatient Visits           4 months ago Foraminal stenosis of cervical region   [facility] Malva Limes, MD   6 months ago RUQ pain   [place] [facility] [place], [place], PA-C   10 months ago Toe pain, right   [facility] Malva Limes, MD   1 year ago Enteritis   [place] [facility] [place], [place], PA-C   1 year ago Enteritis   [facility] Malva Limes, MD       Future Appointments             In 1 month Fisher, Demetrios Isaacs, MD [facility]

## 2023-03-24 ENCOUNTER — Other Ambulatory Visit: Payer: Self-pay | Admitting: Family Medicine

## 2023-03-24 DIAGNOSIS — G8929 Other chronic pain: Secondary | ICD-10-CM

## 2023-03-24 NOTE — Telephone Encounter (Unsigned)
 Copied from CRM [id number]. Topic: Clinical - Medication Refill >> Mar 24, 2023 12:59 PM Gildardo Pounds wrote: Most Recent Primary Care Visit:  Provider: Hal Hope  Department: [facility]  Visit Type: MEDICARE AWV, SEQUENTIAL  Date: 03/08/2023  Medication: HYDROcodone-acetaminophen (NORCO) 10-325 MG tablet  Has the patient contacted their pharmacy? Yes (Agent: If no, request that the patient contact the pharmacy for the refill. If patient does not wish to contact the pharmacy document the reason why and proceed with request.) (Agent: If yes, when and what did the pharmacy advise?)  Is this the correct pharmacy for this prescription? Yes If no, delete pharmacy and type the correct one.  This is the patient's preferred pharmacy:  [phone] Dan Humphreys, [place] - [address] [place] [address] Phone: [phone] Fax: [phone]   Has the prescription been filled recently? No  Is the patient out of the medication? No  Has the patient been seen for an appointment in the last year OR does the patient have an upcoming appointment? Yes  Can we respond through MyChart? Yes  Agent: Please be advised that Rx refills may take up to 3 business days. We ask that you follow-up with your pharmacy.

## 2023-03-25 MED ORDER — HYDROCODONE-ACETAMINOPHEN 10-325 MG PO TABS
1.0000 | ORAL_TABLET | Freq: Four times a day (QID) | ORAL | 0 refills | Status: DC | PRN
Start: 2023-03-25 — End: 2023-04-11

## 2023-03-25 NOTE — Telephone Encounter (Signed)
 Requested medications are due for refill today.  yes  Requested medications are on the active medications list.  yes  Last refill. 03/10/2023  Future visit scheduled.   yes  Notes to clinic.  Refill not delegated.    Requested Prescriptions  Pending Prescriptions Disp Refills   HYDROcodone-acetaminophen (NORCO) 10-325 MG tablet 60 tablet 0    Sig: Take 1 tablet by mouth every 6 (six) hours as needed.     Not Delegated - Analgesics:  Opioid Agonist Combinations Failed - 03/25/2023 11:18 AM      Failed - This refill cannot be delegated      Failed - Urine Drug Screen completed in last 360 days      Failed - Valid encounter within last 3 months    Recent Outpatient Visits           5 months ago Foraminal stenosis of cervical region   [facility] Malva Limes, MD   6 months ago RUQ pain   [place] [facility] [place], [place], [place]   11 months ago Toe pain, right   [facility] Malva Limes, MD   1 year ago Enteritis   [facility] [place], [place], PA-C   1 year ago Enteritis   [facility] Malva Limes, MD       Future Appointments             In 3 weeks Fisher, Demetrios Isaacs, MD [facility]

## 2023-04-11 ENCOUNTER — Other Ambulatory Visit: Payer: Self-pay | Admitting: Family Medicine

## 2023-04-11 DIAGNOSIS — G8929 Other chronic pain: Secondary | ICD-10-CM

## 2023-04-11 NOTE — Telephone Encounter (Unsigned)
 Copied from CRM [id number]. Topic: Clinical - Medication Refill >> Apr 11, 2023  8:14 AM Everette C wrote: Most Recent Primary Care Visit:  Provider: Hal Hope  Department: [facility]  Visit Type: MEDICARE AWV, SEQUENTIAL  Date: 03/08/2023  Medication: HYDROcodone-acetaminophen (NORCO) 10-325 MG tablet [[id number]]  Has the patient contacted their pharmacy? No (Agent: If no, request that the patient contact the pharmacy for the refill. If patient does not wish to contact the pharmacy document the reason why and proceed with request.) (Agent: If yes, when and what did the pharmacy advise?)  Is this the correct pharmacy for this prescription? Yes If no, delete pharmacy and type the correct one.  This is the patient's preferred pharmacy:  [phone] Dan Humphreys, [place] - [address] [place] [address] Phone: [phone] Fax: [phone]   Has the prescription been filled recently? Yes  Is the patient out of the medication? No  Has the patient been seen for an appointment in the last year OR does the patient have an upcoming appointment? Yes  Can we respond through MyChart? No  Agent: Please be advised that Rx refills may take up to 3 business days. We ask that you follow-up with your pharmacy.

## 2023-04-12 NOTE — Telephone Encounter (Signed)
 Requested medications are due for refill today.  yes  Requested medications are on the active medications list.  yes  Last refill. 03/25/2023 #60 0 rf  Future visit scheduled.   yes  Notes to clinic.  Refill not delegated.    Requested Prescriptions  Pending Prescriptions Disp Refills   HYDROcodone-acetaminophen (NORCO) 10-325 MG tablet 60 tablet 0    Sig: Take 1 tablet by mouth every 6 (six) hours as needed.     Not Delegated - Analgesics:  Opioid Agonist Combinations Failed - 04/12/2023  5:50 PM      Failed - This refill cannot be delegated      Failed - Urine Drug Screen completed in last 360 days      Passed - Valid encounter within last 3 months    Recent Outpatient Visits           1 month ago Dysuria   [place] [facility] Malva Limes, MD   1 month ago Foraminal stenosis of cervical region   [facility] [place], Marzella Schlein, MD       Future Appointments             In 3 days Fisher, Demetrios Isaacs, MD [facility]

## 2023-04-13 MED ORDER — HYDROCODONE-ACETAMINOPHEN 10-325 MG PO TABS
1.0000 | ORAL_TABLET | Freq: Four times a day (QID) | ORAL | 0 refills | Status: DC | PRN
Start: 2023-04-13 — End: 2023-04-25

## 2023-04-15 ENCOUNTER — Ambulatory Visit: Payer: Medicare Other | Admitting: Family Medicine

## 2023-04-25 ENCOUNTER — Other Ambulatory Visit: Payer: Self-pay | Admitting: Family Medicine

## 2023-04-25 DIAGNOSIS — G8929 Other chronic pain: Secondary | ICD-10-CM

## 2023-04-25 NOTE — Telephone Encounter (Unsigned)
 Copied from CRM [id number]. Topic: Clinical - Medication Refill >> Apr 25, 2023  8:13 AM Dorthula Gavel H wrote: Most Recent Primary Care Visit:  Provider: Pinky Bright  Department: [facility]  Visit Type: MEDICARE AWV, SEQUENTIAL  Date: 03/08/2023  Medication: HYDROcodone-acetaminophen (NORCO) 10-325 MG tablet Has the patient contacted their pharmacy? Yes (Agent: If no, request that the patient contact the pharmacy for the refill. If patient does not wish to contact the pharmacy document the reason why and proceed with request.) (Agent: If yes, when and what did the pharmacy advise?)  Is this the correct pharmacy for this prescription? Yes If no, delete pharmacy and type the correct one.  This is the patient's preferred pharmacy:  [name], [place] - [address] [place] [address] Phone: [phone] Fax: [phone]   Has the prescription been filled recently? Yes  Is the patient out of the medication? Yes  Has the patient been seen for an appointment in the last year OR does the patient have an upcoming appointment? Yes  Can we respond through MyChart? Yes  Agent: Please be advised that Rx refills may take up to 3 business days. We ask that you follow-up with your pharmacy.

## 2023-04-26 NOTE — Telephone Encounter (Signed)
 Requested medications are due for refill today.  no  Requested medications are on the active medications list.  yes  Last refill. 04/13/2023 #60 0 rf  Future visit scheduled.   yes  Notes to clinic.  Refill/refusal not delegated.    Requested Prescriptions  Pending Prescriptions Disp Refills   HYDROcodone-acetaminophen (NORCO) 10-325 MG tablet 60 tablet 0    Sig: Take 1 tablet by mouth every 6 (six) hours as needed.     Not Delegated - Analgesics:  Opioid Agonist Combinations Failed - 04/26/2023 11:04 AM      Failed - This refill cannot be delegated      Failed - Urine Drug Screen completed in last 360 days      Passed - Valid encounter within last 3 months    Recent Outpatient Visits           2 months ago Dysuria   [facility] Lamon Pillow, MD   2 months ago Foraminal stenosis of cervical region   [facility], Stan Eans, MD

## 2023-04-27 MED ORDER — HYDROCODONE-ACETAMINOPHEN 10-325 MG PO TABS: 1.0000 | ORAL_TABLET | Freq: Four times a day (QID) | ORAL | 0 refills | Status: DC | PRN

## 2023-05-01 ENCOUNTER — Other Ambulatory Visit: Payer: Self-pay | Admitting: Family Medicine

## 2023-05-10 ENCOUNTER — Other Ambulatory Visit: Payer: Self-pay | Admitting: Family Medicine

## 2023-05-10 DIAGNOSIS — G8929 Other chronic pain: Secondary | ICD-10-CM

## 2023-05-10 NOTE — Telephone Encounter (Signed)
 Copied from CRM [id number]. Topic: Clinical - Medication Refill >> May 10, 2023  8:15 AM Gary Carter H wrote: Most Recent Primary Care Visit:  Provider: Pinky Bright  Department: [facility]  Visit Type: MEDICARE AWV, SEQUENTIAL  Date: 03/08/2023  Medication: HYDROcodone -acetaminophen  (NORCO) 10-325 MG tablet [[id number]  Has the patient contacted their pharmacy? No (Agent: If no, request that the patient contact the pharmacy for the refill. If patient does not wish to contact the pharmacy document the reason why and proceed with request.) (Agent: If yes, when and what did the pharmacy advise?)  Is this the correct pharmacy for this prescription? Yes If no, delete pharmacy and type the correct one.  This is the patient's preferred pharmacy:  [phone] [name], [place] - [address] [name] [address] Phone: [phone] Fax: [phone]   Has the prescription been filled recently? No  Is the patient out of the medication? Yes  Has the patient been seen for an appointment in the last year OR does the patient have an upcoming appointment? Yes  Can we respond through MyChart? No  Agent: Please be advised that Rx refills may take up to 3 business days. We ask that you follow-up with your pharmacy.

## 2023-05-12 NOTE — Telephone Encounter (Signed)
 Requested medication (s) are due for refill today - yes  Requested medication (s) are on the active medication list -yes  Future visit scheduled -no  Last refill: 04/27/23 #60  Notes to clinic: non delegated Rx  Requested Prescriptions  Pending Prescriptions Disp Refills   HYDROcodone -acetaminophen  (NORCO) 10-325 MG tablet 60 tablet 0    Sig: Take 1 tablet by mouth every 6 (six) hours as needed.     Not Delegated - Analgesics:  Opioid Agonist Combinations Failed - 05/12/2023  9:22 AM      Failed - This refill cannot be delegated      Failed - Urine Drug Screen completed in last 360 days      Passed - Valid encounter within last 3 months    Recent Outpatient Visits           2 months ago Dysuria   [facility] Lamon Pillow, MD   2 months ago Foraminal stenosis of cervical region   [facility], Stan Eans, MD                 Requested Prescriptions  Pending Prescriptions Disp Refills   HYDROcodone -acetaminophen  (NORCO) 10-325 MG tablet 60 tablet 0    Sig: Take 1 tablet by mouth every 6 (six) hours as needed.     Not Delegated - Analgesics:  Opioid Agonist Combinations Failed - 05/12/2023  9:22 AM      Failed - This refill cannot be delegated      Failed - Urine Drug Screen completed in last 360 days      Passed - Valid encounter within last 3 months    Recent Outpatient Visits           2 months ago Dysuria   [facility] Lamon Pillow, MD   2 months ago Foraminal stenosis of cervical region   [facility], Stan Eans, MD

## 2023-05-13 ENCOUNTER — Other Ambulatory Visit: Payer: Self-pay | Admitting: Family Medicine

## 2023-05-13 NOTE — Telephone Encounter (Signed)
 Pt called upset that he does not have his Rx, states that he only has one left.

## 2023-05-13 NOTE — Telephone Encounter (Signed)
 Requested medication (s) are due for refill today: yes  Requested medication (s) are on the active medication list: yes  Last refill:  04/27/23  Future visit scheduled: yes  Notes to clinic:  Unable to refill per protocol, cannot delegate. Pt is upset about the delay in refill.      Requested Prescriptions  Pending Prescriptions Disp Refills   HYDROcodone -acetaminophen  (NORCO) 10-325 MG tablet 60 tablet 0    Sig: Take 1 tablet by mouth every 6 (six) hours as needed.     Not Delegated - Analgesics:  Opioid Agonist Combinations Failed - 05/13/2023 12:18 PM      Failed - This refill cannot be delegated      Failed - Urine Drug Screen completed in last 360 days      Passed - Valid encounter within last 3 months    Recent Outpatient Visits           2 months ago Dysuria   [facility] Lamon Pillow, MD   2 months ago Foraminal stenosis of cervical region   [facility], Stan Eans, MD

## 2023-05-14 ENCOUNTER — Encounter: Payer: Self-pay | Admitting: Family Medicine

## 2023-05-14 DIAGNOSIS — E559 Vitamin D deficiency, unspecified: Secondary | ICD-10-CM | POA: Insufficient documentation

## 2023-05-14 MED ORDER — HYDROCODONE-ACETAMINOPHEN 10-325 MG PO TABS: 1.0000 | ORAL_TABLET | Freq: Four times a day (QID) | ORAL | 0 refills | Status: DC | PRN

## 2023-05-26 ENCOUNTER — Telehealth: Payer: Self-pay

## 2023-05-26 NOTE — Telephone Encounter (Signed)
 What is the medical reason he is requesting the referral?

## 2023-05-26 NOTE — Telephone Encounter (Signed)
 Copied from CRM [id number]. Topic: Referral - Request for Referral >> May 25, 2023  4:37 PM Lorenz Romano B wrote: Did the patient discuss referral with their provider in the last year? Yes (If No - schedule appointment) (If Yes - send message)  Appointment offered? No  Type of order/referral and detailed reason for visit: urologist  Preference of office, provider, location: [place] If referral order, have you been seen by this specialty before? No (If Yes, this issue or another issue? When? Where?  Can we respond through MyChart? No

## 2023-05-27 ENCOUNTER — Other Ambulatory Visit: Payer: Self-pay | Admitting: Family Medicine

## 2023-05-27 DIAGNOSIS — G8929 Other chronic pain: Secondary | ICD-10-CM

## 2023-05-27 NOTE — Telephone Encounter (Signed)
 Copied from CRM [id number]. Topic: Clinical - Medication Refill >> May 27, 2023  3:20 PM Jakyia R wrote: Medication: HYDROcodone -acetaminophen  (NORCO) 10-325 MG tablet  Has the patient contacted their pharmacy? Yes (Agent: If no, request that the patient contact the pharmacy for the refill. If patient does not wish to contact the pharmacy document the reason why and proceed with request.) (Agent: If yes, when and what did the pharmacy advise?) Yes Pt has to request from dr first  This is the patient's preferred pharmacy:  [phone] [name], [place] - [address] [place] [address] Phone: [phone] Fax: [phone]  Is this the correct pharmacy for this prescription? Yes If no, delete pharmacy and type the correct one.   Has the prescription been filled recently? Yes  Is the patient out of the medication? No  Has the patient been seen for an appointment in the last year OR does the patient have an upcoming appointment? Yes  Can we respond through MyChart? No  Agent: Please be advised that Rx refills may take up to 3 business days. We ask that you follow-up with your pharmacy.

## 2023-05-30 NOTE — Telephone Encounter (Signed)
 Requested medications are due for refill today.  yes  Requested medications are on the active medications list.  yes  Last refill. 05/14/2023 #60 0 rf  Future visit scheduled.   yes  Notes to clinic.  Refill not delegated.    Requested Prescriptions  Pending Prescriptions Disp Refills   HYDROcodone -acetaminophen  (NORCO) 10-325 MG tablet 60 tablet 0    Sig: Take 1 tablet by mouth every 6 (six) hours as needed.     Not Delegated - Analgesics:  Opioid Agonist Combinations Failed - 05/30/2023  5:39 PM      Failed - This refill cannot be delegated      Failed - Urine Drug Screen completed in last 360 days      Passed - Valid encounter within last 3 months    Recent Outpatient Visits           3 months ago Dysuria   [facility] Lamon Pillow, MD   3 months ago Foraminal stenosis of cervical region   [facility], Stan Eans, MD

## 2023-05-31 MED ORDER — HYDROCODONE-ACETAMINOPHEN 10-325 MG PO TABS: 1.0000 | ORAL_TABLET | Freq: Four times a day (QID) | ORAL | 0 refills | Status: DC | PRN

## 2023-06-10 ENCOUNTER — Telehealth: Payer: Self-pay

## 2023-06-10 NOTE — Telephone Encounter (Signed)
 Copied from CRM [id number]. Topic: General - Other >> Jun 10, 2023  2:09 PM Marissa P wrote: Reason for CRM: Patient wanted to leave a message for the nurse that when they call back for the specialist just to leave a message and he will call back please.

## 2023-06-14 ENCOUNTER — Other Ambulatory Visit: Payer: Self-pay | Admitting: Family Medicine

## 2023-06-14 DIAGNOSIS — G8929 Other chronic pain: Secondary | ICD-10-CM

## 2023-06-14 NOTE — Telephone Encounter (Signed)
 Copied from CRM [id number]. Topic: Clinical - Medication Refill >> Jun 14, 2023  9:25 AM Ethelle Herb L wrote: Medication: HYDROcodone -acetaminophen  (NORCO) 10-325 MG tablet  Has the patient contacted their pharmacy? Yes No, needs to contact the office for refills for this specific rx.   This is the patient's preferred pharmacy:  [phone] [name], [place] - [address] [place] [address] Phone: [phone] Fax: [phone]  Is this the correct pharmacy for this prescription? Yes  Has the prescription been filled recently? No  Is the patient out of the medication? No  Has the patient been seen for an appointment in the last year OR does the patient have an upcoming appointment? Yes  Can we respond through MyChart? No  Agent: Please be advised that Rx refills may take up to 3 business days. We ask that you follow-up with your pharmacy.

## 2023-06-15 NOTE — Telephone Encounter (Signed)
 Requested medication (s) are due for refill today -unsure  Requested medication (s) are on the active medication list -yes  Future visit scheduled -no  Last refill: 05/31/23 #60   Notes to clinic: non delegated Rx  Requested Prescriptions  Pending Prescriptions Disp Refills   HYDROcodone -acetaminophen  (NORCO) 10-325 MG tablet 60 tablet 0    Sig: Take 1 tablet by mouth every 6 (six) hours as needed.     Not Delegated - Analgesics:  Opioid Agonist Combinations Failed - 06/15/2023 12:02 PM      Failed - This refill cannot be delegated      Failed - Urine Drug Screen completed in last 360 days      Failed - Valid encounter within last 3 months    Recent Outpatient Visits           3 months ago Dysuria   [facility] Lamon Pillow, MD   4 months ago Foraminal stenosis of cervical region   [facility], Stan Eans, MD                 Requested Prescriptions  Pending Prescriptions Disp Refills   HYDROcodone -acetaminophen  (NORCO) 10-325 MG tablet 60 tablet 0    Sig: Take 1 tablet by mouth every 6 (six) hours as needed.     Not Delegated - Analgesics:  Opioid Agonist Combinations Failed - 06/15/2023 12:02 PM      Failed - This refill cannot be delegated      Failed - Urine Drug Screen completed in last 360 days      Failed - Valid encounter within last 3 months    Recent Outpatient Visits           3 months ago Dysuria   [facility] Lamon Pillow, MD   4 months ago Foraminal stenosis of cervical region   [facility], Stan Eans, MD

## 2023-06-17 MED ORDER — HYDROCODONE-ACETAMINOPHEN 10-325 MG PO TABS: 1.0000 | ORAL_TABLET | Freq: Four times a day (QID) | ORAL | 0 refills | Status: DC | PRN

## 2023-06-30 ENCOUNTER — Other Ambulatory Visit: Payer: Self-pay | Admitting: Family Medicine

## 2023-06-30 DIAGNOSIS — G8929 Other chronic pain: Secondary | ICD-10-CM

## 2023-06-30 NOTE — Telephone Encounter (Unsigned)
 Copied from CRM [id number]. Topic: Clinical - Medication Refill >> Jun 30, 2023 10:56 AM Jalayah J wrote: Medication: HYDROcodone -acetaminophen  (NORCO) 10-325 MG tablet  Has the patient contacted their pharmacy? Yes (Agent: If no, request that the patient contact the pharmacy for the refill. If patient does not wish to contact the pharmacy document the reason why and proceed with request.) (Agent: If yes, when and what did the pharmacy advise?)  This is the patient's preferred pharmacy:  [phone] [name], [place] - [address] [place] [address] Phone: [phone] Fax: [phone]  Is this the correct pharmacy for this prescription? Yes If no, delete pharmacy and type the correct one.   Has the prescription been filled recently? Yes  Is the patient out of the medication? No  Has the patient been seen for an appointment in the last year OR does the patient have an upcoming appointment? Yes  Can we respond through MyChart? Yes  Agent: Please be advised that Rx refills may take up to 3 business days. We ask that you follow-up with your pharmacy.

## 2023-07-01 NOTE — Telephone Encounter (Signed)
 Requested medications are due for refill today.  yes  Requested medications are on the active medications list.  yes  Last refill. 06/17/2023 #60 0 rf  Future visit scheduled.   yes  Notes to clinic.  Refill not delegated.    Requested Prescriptions  Pending Prescriptions Disp Refills   HYDROcodone -acetaminophen  (NORCO) 10-325 MG tablet 60 tablet 0    Sig: Take 1 tablet by mouth every 6 (six) hours as needed.     Not Delegated - Analgesics:  Opioid Agonist Combinations Failed - 07/01/2023  6:12 PM      Failed - This refill cannot be delegated      Failed - Urine Drug Screen completed in last 360 days      Failed - Valid encounter within last 3 months    Recent Outpatient Visits           4 months ago Dysuria   [facility] Lamon Pillow, [facility]   4 months ago Foraminal stenosis of cervical region   [facility], Stan Eans, MD

## 2023-07-03 MED ORDER — HYDROCODONE-ACETAMINOPHEN 10-325 MG PO TABS: 1.0000 | ORAL_TABLET | Freq: Four times a day (QID) | ORAL | 0 refills | Status: DC | PRN

## 2023-07-21 ENCOUNTER — Other Ambulatory Visit: Payer: Self-pay | Admitting: Family Medicine

## 2023-07-25 ENCOUNTER — Telehealth: Payer: Self-pay

## 2023-07-25 DIAGNOSIS — G8929 Other chronic pain: Secondary | ICD-10-CM

## 2023-07-25 MED ORDER — HYDROCODONE-ACETAMINOPHEN 10-325 MG PO TABS
1.0000 | ORAL_TABLET | Freq: Four times a day (QID) | ORAL | 0 refills | Status: DC | PRN
Start: 2023-07-25 — End: 2023-08-09

## 2023-07-25 NOTE — Addendum Note (Signed)
 Addended by: GASPER NANCYANN BRAVO on: 07/25/2023 02:38 PM   Modules accepted: Orders

## 2023-07-25 NOTE — Telephone Encounter (Signed)
 Copied from CRM [id number]. Topic: Clinical - Prescription Issue >> Jul 25, 2023 11:14 AM Delon DASEN wrote: Reason for CRM: HYDROcodone -acetaminophen  (NORCO) 10-325 MG tablet- still waiting for refill- out of medication

## 2023-08-09 ENCOUNTER — Telehealth: Payer: Self-pay | Admitting: Family Medicine

## 2023-08-09 ENCOUNTER — Other Ambulatory Visit: Payer: Self-pay

## 2023-08-09 DIAGNOSIS — G8929 Other chronic pain: Secondary | ICD-10-CM

## 2023-08-09 NOTE — Telephone Encounter (Unsigned)
 Copied from CRM #[id number]. Topic: Clinical - Medication Refill >> Aug 09, 2023  1:10 PM Chasity T wrote: Medication: HYDROcodone -acetaminophen  (NORCO) 10-325 MG tablet   Has the patient contacted their pharmacy? Yes   This is the patient's preferred pharmacy:  [phone] [place], [place] - [address] [place] [address] Phone: [phone] Fax: [phone]  Is this the correct pharmacy for this prescription? Yes If no, delete pharmacy and type the correct one.   Has the prescription been filled recently? Yes  Is the patient out of the medication? Yes  Has the patient been seen for an appointment in the last year OR does the patient have an upcoming appointment? Yes  Can we respond through MyChart? Yes  Agent: Please be advised that Rx refills may take up to 3 business days. We ask that you follow-up with your pharmacy.

## 2023-08-10 MED ORDER — HYDROCODONE-ACETAMINOPHEN 10-325 MG PO TABS: 1.0000 | ORAL_TABLET | Freq: Four times a day (QID) | ORAL | 0 refills | Status: DC | PRN

## 2023-08-22 ENCOUNTER — Ambulatory Visit: Payer: Self-pay

## 2023-08-22 NOTE — Telephone Encounter (Signed)
    FYI Only or Action Required?: FYI only for provider.  Patient was last seen in primary care on 02/23/2023 by Gasper Nancyann BRAVO, MD.  Called Nurse Triage reporting No chief complaint on file..  Symptoms began a week ago.  Interventions attempted: Rest, hydration, or home remedies.  Symptoms are: gradually worsening.  Triage Disposition: No disposition on file.  Patient/caregiver understands and will follow disposition?: YesCopied from CRM #[id number]. Topic: Clinical - Red Word Triage >> Aug 22, 2023  2:37 PM Gustabo D wrote: Shoulder blade is puffy, right side of ear up to his is side of face swollen. Has been having having headaches. Noticed this at the beach a couple days ago. Answer Assessment - Initial Assessment Questions 1. ONSET: When did the swelling start? (e.g., minutes, hours, days)     X 2 days 2. LOCATION: What part of the face is swollen? (e.g., cheek, entire face, jaw joint area, under jaw)     Right side of face swollen, pass jawbone , neck & shoulder blade area 3. SEVERITY: How swollen is it?     4/10 4. ITCHING: Is there any itching? If Yes, ask: How much?   (Scale 1-10; mild, moderate or severe)     no 5. PAIN: Is the swelling painful to touch? If Yes, ask: How painful is it?   (Scale 0-10; mild, moderate or severe)     4/10 6. FEVER: Do you have a fever? If Yes, ask: What is it, how was it measured, and when did it start?      no 7. CAUSE: What do you think is causing the face swelling?     unknown 8. NEW MEDICINES: Have there been any new medicines started recently?     no 9. RECURRENT SYMPTOM: Have you had face swelling before? If Yes, ask: When was the last time? What happened that time?     na 10. OTHER SYMPTOMS: Do you have any other symptoms? (e.g., leg swelling, toothache)       Headache  5/10- front of forehead in the middle of eye brows 11. PREGNANCY: Is there any chance you are pregnant? When was your last menstrual  period?       na  Protocols used: Face Swelling-A-AH

## 2023-08-24 ENCOUNTER — Ambulatory Visit (INDEPENDENT_AMBULATORY_CARE_PROVIDER_SITE_OTHER): Admitting: Family Medicine

## 2023-08-24 ENCOUNTER — Encounter: Payer: Self-pay | Admitting: Family Medicine

## 2023-08-24 VITALS — BP 130/90 | HR 70 | Ht 69.0 in | Wt 193.0 lb

## 2023-08-24 DIAGNOSIS — R591 Generalized enlarged lymph nodes: Secondary | ICD-10-CM

## 2023-08-24 DIAGNOSIS — J0110 Acute frontal sinusitis, unspecified: Secondary | ICD-10-CM

## 2023-08-24 DIAGNOSIS — J301 Allergic rhinitis due to pollen: Secondary | ICD-10-CM | POA: Diagnosis not present

## 2023-08-24 MED ORDER — AMOXICILLIN 500 MG PO CAPS: 1000.0000 mg | ORAL_CAPSULE | Freq: Three times a day (TID) | ORAL | 0 refills | Status: DC

## 2023-08-24 MED ORDER — FLUTICASONE PROPIONATE 50 MCG/ACT NA SUSP: NASAL | 5 refills | Status: AC

## 2023-08-24 MED ORDER — PREDNISONE 10 MG PO TABS: ORAL_TABLET | ORAL | 0 refills | Status: DC

## 2023-08-24 NOTE — Progress Notes (Signed)
 Established patient visit   Patient: Gary Carter   DOB: August 25, 1950   73 y.o. Male  MRN: [id number] Visit Date: 08/24/2023  Today's healthcare provider: Nancyann Perry, MD   Chief Complaint  Patient presents with   Neck    R side of his neck is swollen, he noticed it this past weekend at the beach,    Subjective    Discussed the use of AI scribe software for clinical note transcription with the patient, who gave verbal consent to proceed.  History of Present Illness   Gary Carter is a 73 year old male who presents with right-sided facial swelling and sinus drainage.  He has been experiencing right-sided facial swelling for almost a week, which began after returning from a trip to [place]. The swelling is accompanied by a sensation of pressure around his head, but there is no associated pain.  He reports a slight headache and significant sinus drainage in his throat, which causes him to wake up choking due to mucus accumulation. He denies ear pain and difficulty swallowing food, although he notes a feeling of pressure at the top of his head.  He has not been on any antibiotics recently and mentions that he has previously taken prednisone  without issues.       Medications: Outpatient Medications Prior to Visit  Medication Sig   amLODipine  (NORVASC ) 10 MG tablet TAKE 1 TABLET BY MOUTH EVERY DAY   aspirin  325 MG tablet Take 325 mg by mouth daily.    baclofen  (LIORESAL ) 10 MG tablet Take 1 tablet (10 mg total) by mouth 3 (three) times daily as needed for muscle spasms. (Patient not taking: Reported on 08/24/2023)   buPROPion  (WELLBUTRIN  SR) 150 MG 12 hr tablet TAKE 1 TABLET BY MOUTH TWICE A DAY   cetirizine  (ZYRTEC ) 10 MG tablet TAKE 1 TABLET (10 MG TOTAL) BY MOUTH DAILY. FOR ALLERGIES   Cholecalciferol (VITAMIN D3 PO) Take by mouth daily.   citalopram  (CELEXA ) 40 MG tablet TAKE 1 TABLET BY MOUTH EVERY DAY   clonazePAM  (KLONOPIN ) 0.5 MG tablet TAKE 1 TABLET (0.5 MG  TOTAL) BY MOUTH EVERY 8 (EIGHT) HOURS AS NEEDED. FOR ANXIETY   colchicine  0.6 MG tablet TAKE 2 TABLETS ON FIRST DAY OF GOUT FLARE. 1 TAB 1 HOUR LATER. THEN, 1 TAB DAILY UNTIL RESOLVED. (Patient not taking: Reported on 08/24/2023)   Cyanocobalamin  (VITAMIN B-12 PO) Take by mouth daily.   famotidine  (PEPCID ) 20 MG tablet TAKE 1 TAB (20 MG TOTAL) BY MOUTH 2 TIMES DAILY AS NEEDED FOR HEARTBURN OR INDIGESTION (HEARTBURN)   HYDROcodone -acetaminophen  (NORCO) 10-325 MG tablet Take 1 tablet by mouth every 6 (six) hours as needed.   ibuprofen  (ADVIL ) 600 MG tablet Take 1 tablet (600 mg total) by mouth every 8 (eight) hours as needed for moderate pain.   ipratropium (ATROVENT ) 0.06 % nasal spray Place 2 sprays into both nostrils 4 (four) times daily. (Patient not taking: Reported on 08/24/2023)   metoprolol  succinate (TOPROL -XL) 50 MG 24 hr tablet TAKE 1 TABLET (50 MG TOTAL) BY MOUTH EVERY EVENING. TAKE WITH OR IMMEDIATELY FOLLOWING A MEAL.   omeprazole  (PRILOSEC) 40 MG capsule TAKE 1 CAPSULE (40 MG TOTAL) BY MOUTH DAILY.   pravastatin  (PRAVACHOL ) 40 MG tablet TAKE 1 TABLET BY MOUTH EVERY DAY   sildenafil  (REVATIO ) 20 MG tablet TAKE 1 TO 2 TABLETS BY MOUTH DAILY   spironolactone  (ALDACTONE ) 25 MG tablet TAKE 1 TABLET BY MOUTH EVERY DAY   valsartan -hydrochlorothiazide  (DIOVAN -HCT)  160-25 MG tablet TAKE 1 TABLET BY MOUTH DAILY. TAKE IN PLACE OF ATACAND -HCTZ   Vitamin E 268 MG (400 UNIT) CAPS Take by mouth.   fluticasone  (FLONASE ) 50 MCG/ACT nasal spray SPRAY 2 SPRAYS INTO EACH NOSTRIL EVERY DAY   No facility-administered medications prior to visit.   Review of Systems  Constitutional:  Negative for appetite change, chills and fever.  Respiratory:  Negative for chest tightness, shortness of breath and wheezing.   Cardiovascular:  Negative for chest pain and palpitations.  Gastrointestinal:  Negative for abdominal pain, nausea and vomiting.       Objective    BP (!) 130/90   Pulse 70   Ht 5' 9 (1.753  m)   Wt 193 lb (87.5 kg)   SpO2 98%   BMI 28.50 kg/m   Physical Exam  General Appearance:    Well developed, well nourished male, alert, cooperative, in no acute distress  HENT:   Mildly inflamed left tonsil with some post nasal discharge on left. Tender frontal and maxillary sinus. Tms clear. Mild right pre-auricular swelling and anterior cervical LAD. No tenderness  Eyes:    PERRL, conjunctiva/corneas clear, EOM's intact       Lungs:     Clear to auscultation bilaterally, respirations unlabored  Heart:    Normal heart rate. Normal rhythm. No murmurs, rubs, or gallops.    Neurologic:   Awake, alert, oriented x 3. No apparent focal neurological           defect.         Assessment & Plan    1. Acute non-recurrent frontal sinusitis (Primary)  - amoxicillin  (AMOXIL ) 500 MG capsule; Take 2 capsules (1,000 mg total) by mouth 3 (three) times daily for 7 days.  Dispense: 42 capsule; Refill: 0 - predniSONE  (DELTASONE ) 10 MG tablet; 6 tablets for 1 day, then 5 for 1 day, then 4 for 1 day, then 3 for 1 day, then 2 for 1 day then 1 for 1 day.  Dispense: 21 tablet; Refill: 0  2. Lymphadenopathy Likely secondary to URI. Advised to let me know if swelling does not completely resolved when finished with abs.   3. Allergic rhinitis due to pollen, unspecified seasonality refill fluticasone  (FLONASE ) 50 MCG/ACT nasal spray; SPRAY 2 SPRAYS INTO EACH NOSTRIL EVERY DAY  Dispense: 48 mL; Refill: 5       Nancyann Perry, MD  [facility] [phone] (phone) [phone] (fax)  [facility]

## 2023-08-26 ENCOUNTER — Other Ambulatory Visit: Payer: Self-pay | Admitting: Family Medicine

## 2023-08-26 DIAGNOSIS — G8929 Other chronic pain: Secondary | ICD-10-CM

## 2023-08-26 NOTE — Telephone Encounter (Unsigned)
 Copied from CRM #[id number]. Topic: Clinical - Medication Refill >> Aug 26, 2023  3:01 PM Teressa P wrote: Medication: Hydro Condone 10-325  Has the patient contacted their pharmacy? No  This is the patient's preferred pharmacy:  [phone] [address] [place], [place] - [address] [place] [address] Phone: [phone] Fax: [phone]  Is this the correct pharmacy for this prescription? Yes  Has the prescription been filled recently? Yes  Is the patient out of the medication? No  Has the patient been seen for an appointment in the last year OR does the patient have an upcoming appointment? Yes  Can we respond through MyChart? No  Agent: Please be advised that Rx refills may take up to 3 business days. We ask that you follow-up with your pharmacy.

## 2023-08-29 ENCOUNTER — Other Ambulatory Visit: Payer: Self-pay | Admitting: Family Medicine

## 2023-08-29 ENCOUNTER — Ambulatory Visit: Payer: Self-pay

## 2023-08-29 DIAGNOSIS — Z716 Tobacco abuse counseling: Secondary | ICD-10-CM

## 2023-08-29 MED ORDER — HYDROCODONE-ACETAMINOPHEN 10-325 MG PO TABS: 1.0000 | ORAL_TABLET | Freq: Four times a day (QID) | ORAL | 0 refills | Status: DC | PRN

## 2023-08-29 NOTE — Telephone Encounter (Signed)
 FYI Only or Action Required?: FYI only for provider.  Patient was last seen in primary care on 08/24/2023 by Gasper Nancyann BRAVO, MD.  Called Nurse Triage reporting Facial Swelling.  Symptoms began 1-2 weeks ago.  Interventions attempted: Prescription medications: amoxicillin  and prednisone .  Symptoms are: unchanged.  Triage Disposition: See PCP When Office is Open (Within 3 Days)  Patient/caregiver understands and will follow disposition?: Yes                             Copied from CRM #[id number]. Topic: Clinical - Red Word Triage >> Aug 29, 2023  9:20 AM Thersia BROCKS wrote: Red Word that prompted transfer to Nurse Triage: Patient called in regarding the right side of face is still swollen , has finished the medication that he was prescribed by Dr.Fisher, wasn't for sure if he needs another round . Reason for Disposition  [1] MILD face swelling (e.g., puffiness) AND [2] persists > 3 days  Answer Assessment - Initial Assessment Questions 1. ONSET: When did the swelling start? (e.g., minutes, hours, days)     1-2 weeks, OV on 8/13, states symptoms are not improving  2. LOCATION: What part of the face is swollen? (e.g., cheek, entire face, jaw joint area, under jaw)     Right side along jaw and ear  4. ITCHING: Is there any itching? If Yes, ask: How much?   (Scale 1-10; mild, moderate or severe)     Denies 5. PAIN: Is the swelling painful to touch? If Yes, ask: How painful is it?   (Scale 0-10; mild, moderate or severe)     Unsure, states he has pain at baseline due to arthritis in neck 6. FEVER: Do you have a fever? If Yes, ask: What is it, how was it measured, and when did it start?      Denies 7. CAUSE: What do you think is causing the face swelling?     Unsure 9. RECURRENT SYMPTOM: Have you had face swelling before? If Yes, ask: When was the last time? What happened that time?     Denies 10. OTHER SYMPTOMS: Do you have any other  symptoms? (e.g., leg swelling, toothache)       Slight headache, denies difficulty breathing, denies difficulty swallowing, denies rash, denies redness, denies numbness, denies weakness, denies sore throat, denies cold/flu symptoms    States he has finished prednisone  and has 2 doses of antibiotic left. States facial swelling has not improved.  Protocols used: Face Swelling-A-AH

## 2023-08-29 NOTE — Telephone Encounter (Signed)
 Pt is calling back about the status of this being refilled. Please call pt and advise.

## 2023-08-30 ENCOUNTER — Other Ambulatory Visit: Payer: Self-pay | Admitting: Family Medicine

## 2023-08-30 ENCOUNTER — Ambulatory Visit (INDEPENDENT_AMBULATORY_CARE_PROVIDER_SITE_OTHER): Admitting: Family Medicine

## 2023-08-30 ENCOUNTER — Encounter: Payer: Self-pay | Admitting: Family Medicine

## 2023-08-30 VITALS — BP 136/96 | HR 66 | Resp 14 | Ht 69.0 in | Wt 191.7 lb

## 2023-08-30 DIAGNOSIS — R220 Localized swelling, mass and lump, head: Secondary | ICD-10-CM | POA: Diagnosis not present

## 2023-08-30 MED ORDER — CEFPODOXIME PROXETIL 200 MG PO TABS: 200.0000 mg | ORAL_TABLET | Freq: Two times a day (BID) | ORAL | 0 refills | Status: AC
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
## 2023-08-31 ENCOUNTER — Ambulatory Visit
Admission: RE | Admit: 2023-08-31 | Discharge: 2023-08-31 | Disposition: A | Discharge status: Home / Self Care | Source: Ambulatory Visit | Attending: Family Medicine | Admitting: Family Medicine

## 2023-08-31 DIAGNOSIS — R220 Localized swelling, mass and lump, head: Secondary | ICD-10-CM | POA: Diagnosis not present

## 2023-08-31 DIAGNOSIS — R221 Localized swelling, mass and lump, neck: Secondary | ICD-10-CM | POA: Diagnosis not present

## 2023-09-01 ENCOUNTER — Telehealth: Payer: Self-pay | Admitting: Family Medicine

## 2023-09-01 ENCOUNTER — Ambulatory Visit: Payer: Self-pay | Admitting: Family Medicine

## 2023-09-01 DIAGNOSIS — J0110 Acute frontal sinusitis, unspecified: Secondary | ICD-10-CM

## 2023-09-01 NOTE — Telephone Encounter (Signed)
 Copied from CRM #[id number]. Topic: General - Other >> Sep 01, 2023  9:09 AM Turkey B wrote: Reason for CRM: Patient called in gave lab results of neck, dr Gasper noted wants to do fu for the  ct scan of neck. Please cb patient of date for his fu for th scan

## 2023-09-01 NOTE — Telephone Encounter (Unsigned)
 Copied from CRM [id number]. Topic: Clinical - Prescription Issue >> Sep 01, 2023 10:42 AM Tiffini S wrote: Reason for CRM: Patient face is still swollen- needs medication today for more refills predisone sent    [phone] [address], [place] - [address] [place] [address] Phone: [phone] Fax: [phone]

## 2023-09-01 NOTE — Telephone Encounter (Unsigned)
 Copied from CRM [id number]. Topic: Clinical - Medication Refill >> Sep 01, 2023 10:38 AM Tiffini S wrote: Medication: Prednisone    Has the patient contacted their pharmacy? No (Agent: If no, request that the patient contact the pharmacy for the refill. If patient does not wish to contact the pharmacy document the reason why and proceed with request.) (Agent: If yes, when and what did the pharmacy advise?)  This is the patient's preferred pharmacy:  [phone] [address], [place] - [address] [place] [address] Phone: [phone] Fax: [phone]  Is this the correct pharmacy for this prescription? Yes If no, delete pharmacy and type the correct one.   Has the prescription been filled recently? No, patient face is still swollen   Is the patient out of the medication? Yes  Has the patient been seen for an appointment in the last year OR does the patient have an upcoming appointment? Yes  Can we respond through MyChart? No, please call the patient at [phone]  Agent: Please be advised that Rx refills may take up to 3 business days. We ask that you follow-up with your pharmacy.

## 2023-09-01 NOTE — Telephone Encounter (Unsigned)
 Copied from CRM #[id number]. Topic: Clinical - Request for Lab/Test Order >> Sep 01, 2023  9:39 AM Turkey B wrote: Reason for CRM: Please cb patient to give xray results of neck. Disregard previus message of resultls given

## 2023-09-02 ENCOUNTER — Ambulatory Visit: Payer: Self-pay | Admitting: Family Medicine

## 2023-09-02 MED ORDER — PREDNISONE 10 MG PO TABS: ORAL_TABLET | ORAL | 0 refills | Status: AC

## 2023-09-02 NOTE — Telephone Encounter (Signed)
 Attempted to contact the pt, wasn't able to leave a message the mailbox was full ok for E2C2 to relay the results per Dr Lang. He also has an appt scheduled to see Dr B on 8/25

## 2023-09-02 NOTE — Telephone Encounter (Signed)
 FYI Only or Action Required?: Action required by provider: clinical question for provider.  Patient was last seen in primary care on 08/30/2023 by Sharma Coyer, MD.  Called Nurse Triage reporting Facial Swelling.  Symptoms began several weeks ago.  Interventions attempted: cefpodoxime  (VANTIN ) 200 MG tablet .  Symptoms are: unchanged.  Triage Disposition: See PCP When Office is Open (Within 3 Days)  Patient/caregiver understands and will follow disposition?: Yes                  Copied from CRM #[id number]. Topic: Clinical - Red Word Triage >> Sep 02, 2023  8:35 AM Selinda RAMAN wrote: Red Word that prompted transfer to Nurse Triage: The patient called in stating he saw a provider on Tuesday for facial swelling that he has been dealing with for 3 weeks. He said he has tried 2 different antibiotics and it is not getting any better. He complains of of right sided facial swelling and pain near his ear down his jaw line to his neck. I will transfer him to [facility] Reason for Disposition  [1] MILD face swelling (e.g., puffiness) AND [2] persists > 3 days  Answer Assessment - Initial Assessment Questions Pt scheduled for appt on Mon, 8/25, in office. This RN educated pt on new-worsening symptoms and when to call back/seek emergent care. Pt verbalized understanding and agrees to plan.    Pt states he has taken a second round of antibiotics and it didn't help the facial swelling. Pt called yesterday for prednisone  and was told he needed to wait 72 hrs. Swelling hasn't gone down since using antibiotics. Worse in morning. Pt states he had an ultrasound and they didn't see anything besides his carotid artery might be a little swollen. Pt states he was supposed to have further imaging. Pt also would like a prescription for prednisone .    ONSET: When did the swelling start? (e.g., minutes, hours, days)     8/5   LOCATION: What part of the face is swollen? (e.g., cheek,  entire face, jaw joint area, under jaw)     Right side of face- ear and cheekbone, down on neck a little bit  ITCHING: Is there any itching? If Yes, ask: How much?   (Scale 1-10; mild, moderate or severe)     No   PAIN: Is the swelling painful to touch? If Yes, ask: How painful is it?   (Scale 0-10; mild, moderate or severe)     A little pain when turns neck; pt states this is usual for him as he has arthritis in his neck so pt ignored it at first  FEVER: Do you have a fever? If Yes, ask: What is it, how was it measured, and when did it start?      No  NEW MEDICINES: Have there been any new medicines started recently?     No  RECURRENT SYMPTOM: Have you had face swelling before? If Yes, ask: When was the last time? What happened that time?     No  OTHER SYMPTOMS: Do you have any other symptoms? (e.g., leg swelling, toothache)       No  Protocols used: Face Swelling-A-AH

## 2023-09-02 NOTE — Telephone Encounter (Signed)
 No additional interventions recommended at this time other than for patient to follow up with his PCP for additional imaging. It looks like Dr. Gasper wanted him to be re-evaluated prior to ordering the CT. He has already completed abx & steroid therapy with no changes in symptoms

## 2023-09-05 ENCOUNTER — Encounter: Payer: Self-pay | Admitting: Family Medicine

## 2023-09-05 ENCOUNTER — Ambulatory Visit (INDEPENDENT_AMBULATORY_CARE_PROVIDER_SITE_OTHER): Admitting: Family Medicine

## 2023-09-05 VITALS — BP 146/91 | HR 63 | Temp 97.8°F | Ht 69.0 in | Wt 195.7 lb

## 2023-09-05 DIAGNOSIS — R220 Localized swelling, mass and lump, head: Secondary | ICD-10-CM

## 2023-09-05 DIAGNOSIS — R221 Localized swelling, mass and lump, neck: Secondary | ICD-10-CM | POA: Diagnosis not present

## 2023-09-05 MED ORDER — MELOXICAM 15 MG PO TABS: 15.0000 mg | ORAL_TABLET | Freq: Every day | ORAL | 0 refills | Status: DC

## 2023-09-05 MED ORDER — CYCLOBENZAPRINE HCL 5 MG PO TABS: 5.0000 mg | ORAL_TABLET | Freq: Three times a day (TID) | ORAL | 0 refills | Status: AC | PRN

## 2023-09-05 NOTE — Progress Notes (Unsigned)
 Acute Office Visit  Subjective:     Patient ID: Gary Carter, male    DOB: [birth year], 73 y.o.   MRN: [id number]  Chief Complaint  Patient presents with   Acute Visit    Patient states that he is here for facial swelling on his right side has had 2 rounds of antibiotics and u/s done but have not received the results yet.  Patient states that he has some pain in the right side of his neck, reports he just realized it.  When he turns his neck to the right it hurts.    Gary Carter is a 73 yo M who presents to the clinic acutely for follow up on right facial swelling.  On interview, he reports that his right face and neck have been swollen for three weeks. He was previously seen twice at the clinic for the swelling for which her received antibiotics and prednisone . After two different courses of antibiotics and prednisone , he reported no significant difference in the swelling. He continues to experience the swelling on the right side with pain when turning his neck in the same direction. He endorses a shooting pain that extends from near his neck around his ear and towards the top of his head upon turning (which he does often for work at [employer]). At rest, he endorses an ache in the muscle with a constant dull headache. He reports that nothing seems to help or worsen his pain. He endorses dry mouth, but denies any overlying erythema near the swelling. He also denies any changes in his hearing or vision or fevers, chills, sweating, or weight changes. He also denies any dizziness, lightheadedness, or gait instability. His recent ultrasound showed no signs of any masses or fluid.   He reports no other symptoms today.    ROS      Objective:    BP (!) 146/91 (BP Location: Left Arm, Patient Position: Sitting, Cuff Size: Normal)   Pulse 63   Temp 97.8 F (36.6 C) (Oral)   Ht 5' 9 (1.753 m)   Wt 195 lb 11.2 oz (88.8 kg)   SpO2 96%   BMI 28.90 kg/m  {Vitals History  (Optional):23777}  Physical Exam Vitals reviewed.  Constitutional:      General: He is not in acute distress.    Appearance: Normal appearance. He is not ill-appearing or diaphoretic.  HENT:     Right Ear: External ear normal.     Left Ear: External ear normal.     Nose: Nose normal.     Mouth/Throat:     Mouth: Mucous membranes are moist.  Eyes:     General: No scleral icterus.    Conjunctiva/sclera: Conjunctivae normal.  Cardiovascular:     Rate and Rhythm: Normal rate and regular rhythm.     Pulses: Normal pulses.     Heart sounds: Normal heart sounds. No murmur heard.    No friction rub. No gallop.  Pulmonary:     Effort: Pulmonary effort is normal. No respiratory distress.     Breath sounds: Normal breath sounds. No wheezing.  Abdominal:     General: There is no distension.     Palpations: Abdomen is soft.     Tenderness: There is no abdominal tenderness. There is no guarding.  Musculoskeletal:     Cervical back: Edema and tenderness present. Pain with movement and muscular tenderness present. Decreased range of motion.     Right lower leg: No edema.     Left  lower leg: No edema.  Skin:    General: Skin is warm and dry.     Capillary Refill: Capillary refill takes less than 2 seconds.  Neurological:     Mental Status: He is alert and oriented to person, place, and time.  Psychiatric:        Mood and Affect: Mood normal.     No results found for any visits on 09/05/23.      Assessment & Plan:   Problem List Items Addressed This Visit   None Visit Diagnoses       Facial swelling    -  Primary   Relevant Orders   CT Soft Tissue Neck W Contrast     Neck swelling       Relevant Orders   CT Soft Tissue Neck W Contrast      Facial swelling Neck swelling Patient presents with three week history of right facial and neck swelling without resolution of symptoms after antibiotics and prednisone . Endorses tenderness, edema, and shooting pain along the front of  the neck when turning head towards the right side. Denies any systemic symptoms or changes in hearing or vision. Recent ultrasound did not show any masses or fluid. - Order CT neck with contrast - Start meloxicam  15 mg daily - Start cyclobenzaprine  5 mg daily at night   Meds ordered this encounter  Medications   meloxicam  (MOBIC ) 15 MG tablet    Sig: Take 1 tablet (15 mg total) by mouth daily.    Dispense:  30 tablet    Refill:  0   cyclobenzaprine  (FLEXERIL ) 5 MG tablet    Sig: Take 1 tablet (5 mg total) by mouth 3 (three) times daily as needed for muscle spasms.    Dispense:  30 tablet    Refill:  0    Return if symptoms worsen or fail to improve.  Elia LULLA Blanch, Medical Student

## 2023-09-07 ENCOUNTER — Ambulatory Visit: Payer: Self-pay

## 2023-09-07 NOTE — Telephone Encounter (Signed)
 FYI Only or Action Required?: Action required by provider: imaging scheduling.  Patient was last seen in primary care on 09/05/2023 by Myrla Jon HERO, MD.  Called Nurse Triage reporting Facial Swelling.  Symptoms began a couple of weeks ago.  Symptoms are: unchanged.  Triage Disposition: Call PCP When Office is Open  Patient/caregiver understands and will follow disposition?:          Copied from CRM #[id number]. Topic: Clinical - Red Word Triage >> Sep 07, 2023  5:06 PM Gary Carter wrote: Red Word that prompted transfer to Nurse Triage: Patient stated his face is swollen bad and he's having bad headaches, Been like this for going on 2 weeks now.           Reason for Disposition  Nursing judgment or information in reference  Answer Assessment - Initial Assessment Questions 1. REASON FOR CALL: What is your main concern right now?     Patient calling about imaging orders, stating he is still waiting to get his CT scheduled. Patient verbalized frustration about this. Please advise.  Protocols used: No Guideline Available-A-AH

## 2023-09-07 NOTE — Telephone Encounter (Signed)
 Copied from CRM [id number]. Topic: General - Other >> Sep 07, 2023  8:49 AM Gary Carter wrote: Reason for CRM: Recevied call from patient, calling to check status of imaging appointment that was supposed to be scheduled by office for facial swelling, test ordered on Monday 09/05/23 was CT of neck. Patient reports there has been no appointment scheduled and is concerned as symptoms are still present.   Called and spoke to office, states referral is still pending for imaging appointment.  Patient requesting a follow up call as soon as possible for an updated status on appointment for imaging.  Patient can be reached at  [phone].  Patient is aware of same day call back.

## 2023-09-08 NOTE — Telephone Encounter (Signed)
 We sent a medication for pain and sent CT order when he was seen. Referral team should be working on CT

## 2023-09-08 NOTE — Telephone Encounter (Signed)
 Informed patient of provider's message, he stated that they had already called him regarding his appointment for the CT Scan and it is on Wednesday September 3rd.  Patient verbalized understanding.

## 2023-09-13 ENCOUNTER — Other Ambulatory Visit: Payer: Self-pay | Admitting: Family Medicine

## 2023-09-13 DIAGNOSIS — G8929 Other chronic pain: Secondary | ICD-10-CM

## 2023-09-13 MED ORDER — HYDROCODONE-ACETAMINOPHEN 10-325 MG PO TABS: 1.0000 | ORAL_TABLET | Freq: Four times a day (QID) | ORAL | 0 refills | Status: DC | PRN

## 2023-09-13 NOTE — Telephone Encounter (Signed)
 Requested medication (s) are due for refill today: yes  Requested medication (s) are on the active medication list: yes  Last refill:  08/29/23 #60  Future visit scheduled: no  Notes to clinic:  med not delegated to NT to RF   Requested Prescriptions  Pending Prescriptions Disp Refills   HYDROcodone -acetaminophen  (NORCO) 10-325 MG tablet 60 tablet 0    Sig: Take 1 tablet by mouth every 6 (six) hours as needed.     Not Delegated - Analgesics:  Opioid Agonist Combinations Failed - 09/13/2023  3:57 PM      Failed - This refill cannot be delegated      Failed - Urine Drug Screen completed in last 360 days      Failed - Valid encounter within last 3 months    Recent Outpatient Visits           1 week ago Facial swelling   [place] [facility] [place], Jon HERO, MD   2 weeks ago Facial swelling   [place] [facility] [place], [place], MD   2 weeks ago Acute non-recurrent frontal sinusitis   [place] [facility] Gasper Nancyann BRAVO, MD   6 months ago Dysuria   [facility] Gas[place] Nancyann BRAVO, MD   7 months ago Foraminal stenosis of cervical region   [facility], Jon HERO, MD

## 2023-09-13 NOTE — Telephone Encounter (Signed)
 Copied from CRM [id number]. Topic: Clinical - Medication Refill >> Sep 13, 2023  8:25 AM Precious C wrote: Medication: HYDROcodone -acetaminophen  (NORCO) 10-325 MG tablet  Has the patient contacted their pharmacy? Yes, advised to call to provider (Agent: If no, request that the patient contact the pharmacy for the refill. If patient does not wish to contact the pharmacy document the reason why and proceed with request.) (Agent: If yes, when and what did the pharmacy advise?)  This is the patient's preferred pharmacy:  [phone] [address], [place] - [address] [place] [address] Phone: [phone] Fax: [phone]  Is this the correct pharmacy for this prescription? Yes If no, delete pharmacy and type the correct one.   Has the prescription been filled recently? No  Is the patient out of the medication? Yes, in 2 days  Has the patient been seen for an appointment in the last year OR does the patient have an upcoming appointment? Yes  Can we respond through MyChart? no  Agent: Please be advised that Rx refills may take up to 3 business days. We ask that you follow-up with your pharmacy.

## 2023-09-14 ENCOUNTER — Ambulatory Visit: Payer: Self-pay

## 2023-09-14 ENCOUNTER — Ambulatory Visit
Admission: RE | Admit: 2023-09-14 | Discharge: 2023-09-14 | Disposition: A | Discharge status: Home / Self Care | Source: Ambulatory Visit | Attending: Family Medicine | Admitting: Family Medicine

## 2023-09-14 ENCOUNTER — Telehealth: Payer: Self-pay | Admitting: Family Medicine

## 2023-09-14 DIAGNOSIS — Z981 Arthrodesis status: Secondary | ICD-10-CM | POA: Diagnosis not present

## 2023-09-14 DIAGNOSIS — R221 Localized swelling, mass and lump, neck: Secondary | ICD-10-CM | POA: Insufficient documentation

## 2023-09-14 DIAGNOSIS — R220 Localized swelling, mass and lump, head: Secondary | ICD-10-CM | POA: Insufficient documentation

## 2023-09-14 DIAGNOSIS — I700 Atherosclerosis of aorta: Secondary | ICD-10-CM | POA: Diagnosis not present

## 2023-09-14 MED ORDER — IOHEXOL 300 MG/ML  SOLN
75.0000 mL | Freq: Once | INTRAMUSCULAR | Status: AC | PRN
  Administered 2023-09-14: 75 mL via INTRAVENOUS

## 2023-09-14 MED ORDER — SODIUM CHLORIDE 0.9 % IV SOLN: INTRAVENOUS | Status: DC

## 2023-09-14 NOTE — Telephone Encounter (Signed)
 Please advise via phone call, not MyChart. Thank you.  Reason for Disposition  [1] Follow-up call from patient regarding patient's clinical status AND [2] information NON-URGENT  Answer Assessment - Initial Assessment Questions .  Answer Assessment - Initial Assessment Questions 1. REASON FOR CALL or QUESTION: What is your reason for calling today? or How can I best     Patient states he has not received CT results and is concerned as symptoms have not improved with treatments. Denies worsening of symptoms. ED precautions reviewed, including difficulty breathing due to swelling and pt verbalized understanding. He voiced concerns that his neck was not palpated at his previous visits with 3 different providers.  Protocols used: Ronal Spade, PCP Call - No Triage-A-AH Copied from CRM [id number]. Topic: Clinical - Red Word Triage >> Sep 14, 2023  4:38 PM Chasity T wrote: [facility] that prompted transfer to Nurse Triage: swelling on right side of face, headache from neck to head

## 2023-09-14 NOTE — Telephone Encounter (Unsigned)
 Copied from CRM #[id number]. Topic: Clinical - Medical Advice >> Sep 14, 2023 11:21 AM Tiffini S wrote: Reason for CRM: Patient called stating that he had  imaging done today/ believed that the imaging would be priority for results- he has concerns about the right side of his  face and neck that have been swollen for three weeks He is asking for a call back at [phone]

## 2023-09-15 NOTE — Telephone Encounter (Signed)
 I can see that the imaging was done yesterday, however it has not been read by radiology yet.  We do not have control over their workflow.  I cannot speak to the other 2 appointments, but I did palpate his neck at my visit.  If he is worsening, he can make a follow-up appointment with his PCP.

## 2023-09-16 ENCOUNTER — Telehealth: Payer: Self-pay

## 2023-09-16 NOTE — Telephone Encounter (Signed)
 I have called [facility] and they are going to get them red     Copied from CRM #[id number]. Topic: Clinical - Lab/Test Results >> Sep 15, 2023  4:47 PM Gary Carter wrote: Reason for CRM: pt called to see if the ct scan results have been seen yet. I advised him that it doesn't show that they have. Pt is really concerned about the results and would like for someone to call radiology and double check on the status of them.  Please advise pt as to what is going on. >> Sep 16, 2023  8:10 AM Gary Carter wrote: Pt is calling back again about his ct results. He says he would like to know what is going on with him. Pt was advised that when the results are in he will be notified. Please advise

## 2023-09-16 NOTE — Telephone Encounter (Signed)
 Please contact [facility] and see if his neck CT can be read. Need to rule out abscess.

## 2023-09-16 NOTE — Telephone Encounter (Signed)
 Spoke to [facility] and films will be pushed through for reading

## 2023-09-16 NOTE — Telephone Encounter (Signed)
 Copied from CRM [id number]. Topic: Clinical - Lab/Test Results >> Sep 15, 2023  4:47 PM Gary Carter wrote: Reason for CRM: pt called to see if the ct scan results have been seen yet. I advised him that it doesn't show that they have. Pt is really concerned about the results and would like for someone to call radiology and double check on the status of them.  Please advise pt as to what is going on.

## 2023-09-16 NOTE — Telephone Encounter (Signed)
 Have called [facility] again and they state they have him on the list.  Tried calling patient but he does not answer and his mailbox is full     Copied from CRM [id number]. Topic: Clinical - Lab/Test Results >> Sep 15, 2023  4:47 PM Nathanel BROCKS wrote: Reason for CRM: pt called to see if the ct scan results have been seen yet. I advised him that it doesn't show that they have. Pt is really concerned about the results and would like for someone to call radiology and double check on the status of them.  Please advise pt as to what is going on. >> Sep 16, 2023  3:06 PM Tinnie C wrote: Pt's daughter Lauraine calling in again for results. Relayed that radiology was called and they will get them read and Dr. Gasper will need to release them.  >> Sep 16, 2023  8:10 AM Mia F wrote: Pt is calling back again about his ct results. He says he would like to know what is going on with him. Pt was advised that when the results are in he will be notified. Please advise

## 2023-09-19 ENCOUNTER — Telehealth: Payer: Self-pay

## 2023-09-19 NOTE — Telephone Encounter (Signed)
 Films have been read but not signed off     Copied from CRM [id number]. Topic: Clinical - Lab/Test Results >> Sep 19, 2023  2:59 PM Delon DASEN wrote: Reason for CRM: calling for CT results- [phone]- face still swollen

## 2023-09-20 ENCOUNTER — Ambulatory Visit: Payer: Self-pay | Admitting: Family Medicine

## 2023-09-20 NOTE — Telephone Encounter (Signed)
 Called to inform patient that the provider sent something to him on MyChart, he stated that his daughter has already told him what was wrong.  No explanation, just disconnected.

## 2023-09-20 NOTE — Telephone Encounter (Signed)
 Copied from CRM [id number]. Topic: Clinical - Lab/Test Results >> Sep 20, 2023  1:56 PM Avram MATSU wrote: Reason for CRM: I relayed the test results

## 2023-09-20 NOTE — Telephone Encounter (Signed)
Sent to patients mychart

## 2023-09-21 ENCOUNTER — Other Ambulatory Visit: Payer: Self-pay | Admitting: Family Medicine

## 2023-09-27 ENCOUNTER — Ambulatory Visit (INDEPENDENT_AMBULATORY_CARE_PROVIDER_SITE_OTHER): Admitting: Physician Assistant

## 2023-09-27 ENCOUNTER — Encounter: Payer: Self-pay | Admitting: Physician Assistant

## 2023-09-27 ENCOUNTER — Telehealth: Payer: Self-pay

## 2023-09-27 VITALS — BP 143/99 | HR 80 | Resp 14 | Ht 69.0 in | Wt 194.9 lb

## 2023-09-27 DIAGNOSIS — M5441 Lumbago with sciatica, right side: Secondary | ICD-10-CM

## 2023-09-27 DIAGNOSIS — R82998 Other abnormal findings in urine: Secondary | ICD-10-CM | POA: Diagnosis not present

## 2023-09-27 DIAGNOSIS — Z23 Encounter for immunization: Secondary | ICD-10-CM

## 2023-09-27 DIAGNOSIS — I10 Essential (primary) hypertension: Secondary | ICD-10-CM | POA: Diagnosis not present

## 2023-09-27 DIAGNOSIS — J329 Chronic sinusitis, unspecified: Secondary | ICD-10-CM

## 2023-09-27 DIAGNOSIS — J301 Allergic rhinitis due to pollen: Secondary | ICD-10-CM | POA: Diagnosis not present

## 2023-09-27 DIAGNOSIS — G8929 Other chronic pain: Secondary | ICD-10-CM

## 2023-09-27 DIAGNOSIS — R300 Dysuria: Secondary | ICD-10-CM

## 2023-09-27 DIAGNOSIS — R310 Gross hematuria: Secondary | ICD-10-CM

## 2023-09-27 DIAGNOSIS — R220 Localized swelling, mass and lump, head: Secondary | ICD-10-CM

## 2023-09-27 DIAGNOSIS — M542 Cervicalgia: Secondary | ICD-10-CM

## 2023-09-27 LAB — POCT URINALYSIS DIPSTICK
Bilirubin, UA: NEGATIVE
Blood, UA: NEGATIVE
Glucose, UA: NEGATIVE
Ketones, UA: NEGATIVE
Leukocytes, UA: NEGATIVE
Nitrite, UA: NEGATIVE
Protein, UA: NEGATIVE
Spec Grav, UA: 1.015 (ref 1.010–1.025)
Urobilinogen, UA: 0.2 U/dL
pH, UA: 6 (ref 5.0–8.0)

## 2023-09-27 MED ORDER — CETIRIZINE HCL 10 MG PO TABS: 10.0000 mg | ORAL_TABLET | Freq: Every day | ORAL | 0 refills | Status: DC

## 2023-09-27 NOTE — Telephone Encounter (Signed)
 LOV 09/27/23 NOV none scheduled LRF 09/13/23 qty:60 r:0

## 2023-09-27 NOTE — Progress Notes (Unsigned)
 Established patient visit  Patient: Gary Carter   DOB: October 06, 1950   73 y.o. Male  MRN: [id number] Visit Date: 09/27/2023  Today's healthcare provider: Jolynn Spencer, PA-C   Chief Complaint  Patient presents with   Dysuria    Blood in urine witj frequent urination x 3 days Had covid a few weeks ago unsure if lingerie of dark urine    Facial Swelling    Ongoing facial swelling. Havent found the source of why his face still swells. CT scan results aware. Would like allergy medication (may be this could be why pt face swells)   Subjective     HPI     Dysuria    Additional comments: Blood in urine witj frequent urination x 3 days Had covid a few weeks ago unsure if lingerie of dark urine         Facial Swelling    Additional comments: Ongoing facial swelling. Havent found the source of why his face still swells. CT scan results aware. Would like allergy medication (may be this could be why pt face swells)      Last edited by Wilfred Hargis RAMAN, CMA on 09/27/2023  2:02 PM.       Discussed the use of AI scribe software for clinical note transcription with the patient, who gave verbal consent to proceed.  History of Present Illness Gary Carter is a 73 year old male who presents with blood in his urine and facial swelling.  Two weeks ago, he experienced dark urine with blood during a COVID-19 infection, along with dysuria, which have since resolved. Currently, he has no dark urine or dysuria.  Facial swelling persists despite ultrasound evaluation and treatment with antibiotics and prednisone . The cause remains undetermined, though he suspects allergies may contribute.  He experiences significant neck pain, described as feeling like 'broken glass' when moving his head, especially when looking up. He has a history of cervical spine issues and fusion surgery. Physical therapy worsens his pain, but there is no numbness or tingling.  He has hypertension, which he attributes to  stress and recent illness, and has not taken his medication today.  He quit smoking six to seven months ago and has mild sinusitis with post-nasal drainage and sore throat, possibly due to allergies. He has lived in [place]  all his life and had asthma as a child.  No recent weight loss, visual changes, memory issues, trauma, or accidents.       09/05/2023    2:43 PM 08/30/2023    1:21 PM 08/24/2023   11:21 AM  Depression screen PHQ 2/9  Decreased Interest 0 0 0  Down, Depressed, Hopeless 0 0 0  PHQ - 2 Score 0 0 0  Altered sleeping 0 0 1  Tired, decreased energy 0 0 0  Change in appetite 0 0 0  Feeling bad or failure about yourself  0 0 0  Trouble concentrating 0 0 0  Moving slowly or fidgety/restless 0 0 0  Suicidal thoughts 0 0 0  PHQ-9 Score 0 0 1  Difficult doing work/chores Not difficult at all  Not difficult at all      09/05/2023    2:43 PM 08/30/2023    1:22 PM 08/24/2023   11:22 AM 02/23/2023    8:47 AM  GAD 7 : Generalized Anxiety Score  Nervous, Anxious, on Edge 0 0 0 0  Control/stop worrying 0 0 0 0  Worry too much - different things 0 0 0 0  Trouble relaxing 0 0 0 0  Restless 0 0 0 0  Easily annoyed or irritable 0 0 0 0  Afraid - awful might happen 0 0 0 0  Total GAD 7 Score 0 0 0 0  Anxiety Difficulty Not difficult at all  Not difficult at all Not difficult at all    Medications: Outpatient Medications Prior to Visit  Medication Sig Note   amLODipine  (NORVASC ) 10 MG tablet TAKE 1 TABLET BY MOUTH EVERY DAY    aspirin  325 MG tablet Take 325 mg by mouth daily.     buPROPion  (WELLBUTRIN  SR) 150 MG 12 hr tablet TAKE 1 TABLET BY MOUTH TWICE A DAY    Cholecalciferol (VITAMIN D3 PO) Take by mouth daily.    citalopram  (CELEXA ) 40 MG tablet TAKE 1 TABLET BY MOUTH EVERY DAY    clonazePAM  (KLONOPIN ) 0.5 MG tablet TAKE 1 TABLET (0.5 MG TOTAL) BY MOUTH EVERY 8 (EIGHT) HOURS AS NEEDED. FOR ANXIETY    colchicine  0.6 MG tablet TAKE 2 TABLETS ON FIRST DAY OF GOUT  FLARE. 1 TAB 1 HOUR LATER. THEN, 1 TAB DAILY UNTIL RESOLVED.    Cyanocobalamin  (VITAMIN B-12 PO) Take by mouth daily.    cyclobenzaprine  (FLEXERIL ) 5 MG tablet Take 1 tablet (5 mg total) by mouth 3 (three) times daily as needed for muscle spasms.    fluticasone  (FLONASE ) 50 MCG/ACT nasal spray SPRAY 2 SPRAYS INTO EACH NOSTRIL EVERY DAY    HYDROcodone -acetaminophen  (NORCO) 10-325 MG tablet Take 1 tablet by mouth every 6 (six) hours as needed. 09/27/2023: Pt has been made aware this medication needs to be sent to him for review   ibuprofen  (ADVIL ) 600 MG tablet Take 1 tablet (600 mg total) by mouth every 8 (eight) hours as needed for moderate pain.    ipratropium (ATROVENT ) 0.06 % nasal spray Place 2 sprays into both nostrils 4 (four) times daily.    meloxicam  (MOBIC ) 15 MG tablet Take 1 tablet (15 mg total) by mouth daily as needed for pain.    metoprolol  succinate (TOPROL -XL) 50 MG 24 hr tablet TAKE 1 TABLET (50 MG TOTAL) BY MOUTH EVERY EVENING. TAKE WITH OR IMMEDIATELY FOLLOWING A MEAL.    omeprazole  (PRILOSEC) 40 MG capsule TAKE 1 CAPSULE (40 MG TOTAL) BY MOUTH DAILY.    pravastatin  (PRAVACHOL ) 40 MG tablet TAKE 1 TABLET BY MOUTH EVERY DAY    sildenafil  (REVATIO ) 20 MG tablet TAKE 1 TO 2 TABLETS BY MOUTH DAILY    spironolactone  (ALDACTONE ) 25 MG tablet TAKE 1 TABLET BY MOUTH EVERY DAY    valsartan -hydrochlorothiazide  (DIOVAN -HCT) 160-25 MG tablet TAKE 1 TABLET BY MOUTH DAILY. TAKE IN PLACE OF ATACAND -HCTZ    Vitamin E 268 MG (400 UNIT) CAPS Take by mouth.    [DISCONTINUED] cetirizine  (ZYRTEC ) 10 MG tablet TAKE 1 TABLET (10 MG TOTAL) BY MOUTH DAILY. FOR ALLERGIES    [DISCONTINUED] famotidine  (PEPCID ) 20 MG tablet TAKE 1 TAB (20 MG TOTAL) BY MOUTH 2 TIMES DAILY AS NEEDED FOR HEARTBURN OR INDIGESTION (HEARTBURN)    No facility-administered medications prior to visit.    Review of Systems All negative Except see HPI   {Insert previous labs (optional):23779} {See past labs  Heme  Chem   Endocrine  Serology  Results Review (optional):1}   Objective    BP (!) 143/99   Pulse 80   Resp 14   Ht 5' 9 (1.753 m)   Wt 194 lb 14.4 oz (88.4 kg)   SpO2 97%   BMI 28.78 kg/m  {  Insert last BP/Wt (optional):23777}{See vitals history (optional):1}   Physical Exam Vitals reviewed.  Constitutional:      General: He is not in acute distress.    Appearance: Normal appearance. He is not diaphoretic.  HENT:     Head: Normocephalic and atraumatic.  Eyes:     General: No scleral icterus.    Conjunctiva/sclera: Conjunctivae normal.  Cardiovascular:     Rate and Rhythm: Normal rate and regular rhythm.     Pulses: Normal pulses.     Heart sounds: Normal heart sounds. No murmur heard. Pulmonary:     Effort: Pulmonary effort is normal. No respiratory distress.     Breath sounds: Normal breath sounds. No wheezing or rhonchi.  Musculoskeletal:        General: Tenderness present. No deformity.     Cervical back: Neck supple.     Right lower leg: No edema.     Left lower leg: No edema.  Lymphadenopathy:     Cervical: No cervical adenopathy.  Skin:    General: Skin is warm and dry.     Findings: No rash.  Neurological:     Mental Status: He is alert and oriented to person, place, and time. Mental status is at baseline.     Sensory: No sensory deficit.     Motor: No weakness.     Coordination: Coordination normal.     Gait: Gait normal.     Deep Tendon Reflexes: Reflexes normal.  Psychiatric:        Mood and Affect: Mood normal.        Behavior: Behavior normal.      Results for orders placed or performed in visit on 09/27/23  POCT Urinalysis Dipstick  Result Value Ref Range   Color, UA Yellow    Clarity, UA Clear    Glucose, UA Negative Negative   Bilirubin, UA Negative    Ketones, UA Negative    Spec Grav, UA 1.015 1.010 - 1.025   Blood, UA Negative    pH, UA 6.0 5.0 - 8.0   Protein, UA Negative Negative   Urobilinogen, UA 0.2 0.2 or 1.0 E.U./dL   Nitrite, UA  Negative    Leukocytes, UA Negative Negative   Appearance     Odor          Assessment & Plan Facial swelling, under evaluation Persistent swelling unresponsive to antibiotics or prednisone . No mass or lymphadenopathy. Possible allergy-related etiology. - Prescribe Zyrtec  for potential allergy-related swelling. - Refer to neurology for further evaluation.  Sinusitis and allergic rhinitis Mild sinusitis with post-nasal drainage and sore throat. Possible allergic component. - Prescribe allergy medication for symptom management.  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 Pain exacerbated by movement. Previous physical therapy was painful but necessary. - Refer to neurology for further assessment. - Recommend continuation of physical therapy despite discomfort. - Advise careful use of meloxicam  and consider Tylenol  for pain management.  Chronic back pain, post-lumbar surgery Chronic back pain following two lumbar surgeries. Previous physical therapy was painful. - Recommend continuation of physical therapy despite discomfort. - Discuss potential benefits of chiropractic care with experienced practitioner.  Hypertension Elevated blood pressure noted. No medication taken on the day of the visit. Stress and recent COVID-19 infection may contribute.  General Health Maintenance Discussed recent smoking cessation and interest in flu vaccination. - Administer flu shot during visit.    Orders Placed This Encounter  Procedures   Urine Culture  Flu vaccine HIGH DOSE PF(Fluzone Trivalent)   Comprehensive metabolic panel with GFR   CK (Creatine Kinase)   CBC w/Diff/Platelet   POCT Urinalysis Dipstick    No follow-ups on file.   The patient was advised to call back or seek an in-person evaluation if the symptoms worsen or if the condition fails to improve as anticipated.  I discussed the  assessment and treatment plan with the patient. The patient was provided an opportunity to ask questions and all were answered. The patient agreed with the plan and demonstrated an understanding of the instructions.  I, Albie Bazin, PA-C have reviewed all documentation for this visit. The documentation on 09/27/2023  for the exam, diagnosis, procedures, and orders are all accurate and complete.  Jolynn Spencer, [facility], MMS [facility] [phone] (phone) [phone] (fax)  [facility]

## 2023-09-28 ENCOUNTER — Ambulatory Visit: Payer: Self-pay | Admitting: Physician Assistant

## 2023-09-28 LAB — CBC WITH DIFFERENTIAL/PLATELET
Basophils Absolute: 0.1 x10E3/uL (ref 0.0–0.2)
Basos: 1 %
EOS (ABSOLUTE): 0.2 x10E3/uL (ref 0.0–0.4)
Eos: 2 %
Hematocrit: 42.2 % (ref 37.5–51.0)
Hemoglobin: 14.2 g/dL (ref 13.0–17.7)
Immature Grans (Abs): 0.1 x10E3/uL (ref 0.0–0.1)
Immature Granulocytes: 1 %
Lymphocytes Absolute: 3.1 x10E3/uL (ref 0.7–3.1)
Lymphs: 32 %
MCH: 32.8 pg (ref 26.6–33.0)
MCHC: 33.6 g/dL (ref 31.5–35.7)
MCV: 98 fL — ABNORMAL HIGH (ref 79–97)
Monocytes Absolute: 1.2 x10E3/uL — ABNORMAL HIGH (ref 0.1–0.9)
Monocytes: 13 %
Neutrophils Absolute: 5.2 x10E3/uL (ref 1.4–7.0)
Neutrophils: 51 %
Platelets: 228 x10E3/uL (ref 150–450)
RBC: 4.33 x10E6/uL (ref 4.14–5.80)
RDW: 12 % (ref 11.6–15.4)
WBC: 9.8 x10E3/uL (ref 3.4–10.8)

## 2023-09-28 LAB — COMPREHENSIVE METABOLIC PANEL WITH GFR
ALT: 24 IU/L (ref 0–44)
AST: 19 IU/L (ref 0–40)
Albumin: 4.3 g/dL (ref 3.8–4.8)
Alkaline Phosphatase: 60 IU/L (ref 47–123)
BUN/Creatinine Ratio: 18 (ref 10–24)
BUN: 18 mg/dL (ref 8–27)
Bilirubin Total: 0.3 mg/dL (ref 0.0–1.2)
CO2: 23 mmol/L (ref 20–29)
Calcium: 9.6 mg/dL (ref 8.6–10.2)
Chloride: 100 mmol/L (ref 96–106)
Creatinine, Ser: 0.99 mg/dL (ref 0.76–1.27)
Globulin, Total: 2.6 g/dL (ref 1.5–4.5)
Glucose: 93 mg/dL (ref 70–99)
Potassium: 4.2 mmol/L (ref 3.5–5.2)
Sodium: 137 mmol/L (ref 134–144)
Total Protein: 6.9 g/dL (ref 6.0–8.5)
eGFR: 81 mL/min/1.73 (ref >59)

## 2023-09-28 LAB — CK: Total CK: 120 U/L (ref 41–331)

## 2023-09-29 LAB — URINE CULTURE: Organism ID, Bacteria: NO GROWTH

## 2023-09-30 ENCOUNTER — Other Ambulatory Visit: Payer: Self-pay | Admitting: Family Medicine

## 2023-09-30 DIAGNOSIS — G8929 Other chronic pain: Secondary | ICD-10-CM

## 2023-09-30 NOTE — Telephone Encounter (Signed)
 Copied from CRM [id number]. Topic: Clinical - Medication Refill >> Sep 30, 2023  1:21 PM Emylou G wrote: Medication: HYDROcodone -acetaminophen  (NORCO) 10-325 MG tablet  ** Says he has been waiting since Tuesday.. had an appt  Has the patient contacted their pharmacy? NO (Agent: If no, request that the patient contact the pharmacy for the refill. If patient does not wish to contact the pharmacy document the reason why and proceed with request.) (Agent: If yes, when and what did the pharmacy advise?)   This is the patient's preferred pharmacy[phone] [address] [place],  - [address] [place] [address] Phone: [phone] Fax: [phone]  Is this the correct pharmacy for this prescription? Yes If no, delete pharmacy and type the correct one.   Has the prescription been filled recently? No  Is the patient out of the medication? Yes  Has the patient been seen for an appointment in the last year OR does the patient have an upcoming appointment? Yes  Can we respond through MyChart? Yes  Agent: Please be advised that Rx refills may take up to 3 business days. We ask that you follow-up with your pharmacy.

## 2023-09-30 NOTE — Telephone Encounter (Signed)
 Requested medications are due for refill today.  yes  Requested medications are on the active medications list.  yes  Last refill. 09/13/2022 #60 0 rf  Future visit scheduled.   A wellness visit is scheduled  Notes to clinic.  Refill not delegated.    Requested Prescriptions  Pending Prescriptions Disp Refills   HYDROcodone -acetaminophen  (NORCO) 10-325 MG tablet 60 tablet 0    Sig: Take 1 tablet by mouth every 6 (six) hours as needed.     Not Delegated - Analgesics:  Opioid Agonist Combinations Failed - 09/30/2023  5:35 PM      Failed - This refill cannot be delegated      Failed - Urine Drug Screen completed in last 360 days      Passed - Valid encounter within last 3 months    Recent Outpatient Visits           3 days ago Facial swelling   [place] [facility] [place], Kief, PA-C   3 weeks ago Facial swelling   [place] [facility] [place], Jon HERO, MD   1 month ago Facial swelling    [facility] [place], [place], MD   1 month ago Acute non-recurrent frontal sinusitis   [facility] Gasper Nancyann BRAVO, MD   7 months ago Dysuria   [facility] Gasper Nancyann BRAVO, MD

## 2023-10-03 MED ORDER — HYDROCODONE-ACETAMINOPHEN 10-325 MG PO TABS: 1.0000 | ORAL_TABLET | Freq: Four times a day (QID) | ORAL | 0 refills | Status: DC | PRN

## 2023-10-17 ENCOUNTER — Other Ambulatory Visit: Payer: Self-pay | Admitting: Family Medicine

## 2023-10-17 DIAGNOSIS — G8929 Other chronic pain: Secondary | ICD-10-CM

## 2023-10-17 NOTE — Telephone Encounter (Unsigned)
 Copied from CRM [id number]. Topic: Clinical - Medication Refill >> Oct 17, 2023 11:37 AM Jasmin G wrote: Medication: HYDROcodone -acetaminophen  (NORCO) 10-325 MG tablet  Has the patient contacted their pharmacy? No (Agent: If no, request that the patient contact the pharmacy for the refill. If patient does not wish to contact the pharmacy document the reason why and proceed with request.) (Agent: If yes, when and what did the pharmacy advise?)  This is the patient's preferred pharmacy:  [phone] [address], [place] - [address] [place] [address] Phone: [phone] Fax: [phone]  Is this the correct pharmacy for this prescription? Yes If no, delete pharmacy and type the correct one.   Has the prescription been filled recently? Yes  Is the patient out of the medication? No  Has the patient been seen for an appointment in the last year OR does the patient have an upcoming appointment? Yes  Can we respond through MyChart? Yes  Agent: Please be advised that Rx refills may take up to 3 business days. We ask that you follow-up with your pharmacy.

## 2023-10-18 NOTE — Telephone Encounter (Signed)
 Requested medications are due for refill today.  yes  Requested medications are on the active medications list.  yes  Last refill. 10/03/2023 #60 0 rf  Future visit scheduled.   yes  Notes to clinic.  Refill not delegated.    Requested Prescriptions  Pending Prescriptions Disp Refills   HYDROcodone -acetaminophen  (NORCO) 10-325 MG tablet 60 tablet 0    Sig: Take 1 tablet by mouth every 6 (six) hours as needed.     Not Delegated - Analgesics:  Opioid Agonist Combinations Failed - 10/18/2023  4:49 PM      Failed - This refill cannot be delegated      Failed - Urine Drug Screen completed in last 360 days      Passed - Valid encounter within last 3 months    Recent Outpatient Visits           3 weeks ago Facial swelling   [facility] [place], Kiefer, PA-C   1 month ago Facial swelling   [facility] [facility] [place], Jon HERO, MD   1 month ago Facial swelling   [place] [facility] [place], Ivor, MD   1 month ago Acute non-recurrent frontal sinusitis   [facility] Gas[place] Nancyann BRAVO, MD   7 months ago Dysuria   [facility] Gasper Nancyann BRAVO, MD

## 2023-10-19 MED ORDER — HYDROCODONE-ACETAMINOPHEN 10-325 MG PO TABS: 1.0000 | ORAL_TABLET | Freq: Four times a day (QID) | ORAL | 0 refills | Status: DC | PRN

## 2023-10-24 DIAGNOSIS — J301 Allergic rhinitis due to pollen: Secondary | ICD-10-CM | POA: Diagnosis not present

## 2023-10-24 DIAGNOSIS — H903 Sensorineural hearing loss, bilateral: Secondary | ICD-10-CM | POA: Diagnosis not present

## 2023-10-24 DIAGNOSIS — G510 Bell's palsy: Secondary | ICD-10-CM | POA: Diagnosis not present

## 2023-11-01 ENCOUNTER — Other Ambulatory Visit: Payer: Self-pay | Admitting: Family Medicine

## 2023-11-01 DIAGNOSIS — G8929 Other chronic pain: Secondary | ICD-10-CM

## 2023-11-01 NOTE — Telephone Encounter (Unsigned)
 Copied from CRM [id number]. Topic: Clinical - Medication Refill >> Nov 01, 2023  2:43 PM Everette C wrote: Medication: HYDROcodone -acetaminophen  (NORCO) 10-325 MG tablet  Has the patient contacted their pharmacy? No (Agent: If no, request that the patient contact the pharmacy for the refill. If patient does not wish to contact the pharmacy document the reason why and proceed with request.) (Agent: If yes, when and what did the pharmacy advise?)  This is the patient's preferred pharmacy:  [phone] [address], [place] - [address] [place] [address] Phone: [phone] Fax: [phone]  Is this the correct pharmacy for this prescription? Yes If no, delete pharmacy and type the correct one.   Has the prescription been filled recently? Yes  Is the patient out of the medication? No  Has the patient been seen for an appointment in the last year OR does the patient have an upcoming appointment? Yes  Can we respond through MyChart? No  Agent: Please be advised that Rx refills may take up to 3 business days. We ask that you follow-up with your pharmacy.

## 2023-11-03 NOTE — Telephone Encounter (Signed)
 Requested medication (s) are due for refill today: due 11/19/23  Requested medication (s) are on the active medication list: yes   Last refill:  10/19/23 #60 0 refills  Future visit scheduled: yes 03/13/24  Notes to clinic:  not delegated per protocol. Do you want to refill Rx?      Requested Prescriptions  Pending Prescriptions Disp Refills   HYDROcodone -acetaminophen  (NORCO) 10-325 MG tablet 60 tablet 0    Sig: Take 1 tablet by mouth every 6 (six) hours as needed.     Not Delegated - Analgesics:  Opioid Agonist Combinations Failed - 11/03/2023  1:21 PM      Failed - This refill cannot be delegated      Failed - Urine Drug Screen completed in last 360 days      Passed - Valid encounter within last 3 months    Recent Outpatient Visits           1 month ago Facial swelling   [place] [facility] [place], [place], PA-C   1 month ago Facial swelling   [place] [facility] [place], Jon HERO, MD   2 months ago Facial swelling   [place] [facility] Rosine, [place], MD   2 months ago Acute non-recurrent frontal sinusitis   [facility] Gas[place] Nancyann BRAVO, MD   8 months ago Dysuria   [place] [facility] Gasper Nancyann BRAVO, MD

## 2023-11-04 DIAGNOSIS — J301 Allergic rhinitis due to pollen: Secondary | ICD-10-CM | POA: Diagnosis not present

## 2023-11-04 MED ORDER — HYDROCODONE-ACETAMINOPHEN 10-325 MG PO TABS: 1.0000 | ORAL_TABLET | Freq: Four times a day (QID) | ORAL | 0 refills | Status: DC | PRN

## 2023-11-17 NOTE — Telephone Encounter (Unsigned)
 Copied from CRM [id number]. Topic: Clinical - Medication Refill >> Nov 17, 2023  2:31 PM Teressa P wrote: Medication: Hydrocodone  10-325  Has the patient contacted their pharmacy? No (Agent: If no, request that the patient contact the pharmacy for the refill. If patient does not wish to contact the pharmacy document the reason why and proceed with request.) (Agent: If yes, when and what did the pharmacy advise?)  This is the patient's preferred pharmacy:  [phone] [address] [facility],  - [address] [place] [address] Phone: [phone] Fax: [phone]  Is this the correct pharmacy for this prescription? Yes If no, delete pharmacy and type the correct one.   Has the prescription been filled recently? Yes  Is the patient out of the medication? No  Has the patient been seen for an appointment in the last year OR does the patient have an upcoming appointment? Yes  Can we respond through MyChart? No  Agent: Please be advised that Rx refills may take up to 3 business days. We ask that you follow-up with your pharmacy.

## 2023-11-21 ENCOUNTER — Other Ambulatory Visit: Payer: Self-pay

## 2023-11-21 ENCOUNTER — Telehealth: Payer: Self-pay | Admitting: Family Medicine

## 2023-11-21 DIAGNOSIS — G8929 Other chronic pain: Secondary | ICD-10-CM

## 2023-11-21 NOTE — Telephone Encounter (Signed)
 Copied from CRM [id number]. Topic: Clinical - Prescription Issue >> Nov 21, 2023  1:48 PM Tiffini S wrote: Reason for CRM: Patient called stating that he will be out of the medication- have one tablet left  HYDROcodone -acetaminophen  (NORCO) 10-325 MG tablet  Earliest Fill Date: 10/19/2023  Reason for Discontinue: Reorder  E-Prescribing Status: Receipt confirmed by pharmacy (10/19/2023  8:07 AM EDT)   Please call the patient back at [phone]

## 2023-11-21 NOTE — Telephone Encounter (Signed)
 Converted into a refill request

## 2023-11-22 MED ORDER — HYDROCODONE-ACETAMINOPHEN 10-325 MG PO TABS: 1.0000 | ORAL_TABLET | Freq: Four times a day (QID) | ORAL | 0 refills | Status: DC | PRN

## 2023-12-06 ENCOUNTER — Other Ambulatory Visit: Payer: Self-pay | Admitting: Family Medicine

## 2023-12-06 DIAGNOSIS — G8929 Other chronic pain: Secondary | ICD-10-CM

## 2023-12-06 NOTE — Telephone Encounter (Signed)
 Copied from CRM #[id number]. Topic: Clinical - Medication Refill >> Dec 06, 2023  8:42 AM Tobias L wrote: Medication: HYDROcodone -acetaminophen  (NORCO) 10-325 MG tablet  Has the patient contacted their pharmacy? No Needs to contact the office due to type of medication.  This is the patient's preferred pharmacy:  [phone] [address], [place] - [address] [place] [address] Phone: [phone] Fax: [phone]  Is this the correct pharmacy for this prescription? Yes  Has the prescription been filled recently? No  Is the patient out of the medication? Yes  Has the patient been seen for an appointment in the last year OR does the patient have an upcoming appointment? Yes  Can we respond through MyChart? No  Agent: Please be advised that Rx refills may take up to 3 business days. We ask that you follow-up with your pharmacy.

## 2023-12-07 MED ORDER — HYDROCODONE-ACETAMINOPHEN 10-325 MG PO TABS: 1.0000 | ORAL_TABLET | Freq: Four times a day (QID) | ORAL | 0 refills | Status: DC | PRN

## 2023-12-07 NOTE — Telephone Encounter (Signed)
 Requested medication (s) are due for refill today: Yes  Requested medication (s) are on the active medication list: Yes  Last refill:  11/22/23  Future visit scheduled: No  Notes to clinic:  Not delegated.    Requested Prescriptions  Pending Prescriptions Disp Refills   HYDROcodone -acetaminophen  (NORCO) 10-325 MG tablet 60 tablet 0    Sig: Take 1 tablet by mouth every 6 (six) hours as needed.     Not Delegated - Analgesics:  Opioid Agonist Combinations Failed - 12/07/2023 12:06 PM      Failed - This refill cannot be delegated      Failed - Urine Drug Screen completed in last 360 days      Passed - Valid encounter within last 3 months    Recent Outpatient Visits           2 months ago Facial swelling   [place] [facility] [place], [place], PA-C   3 months ago Facial swelling   [place] [facility] [place], Jon HERO, MD   3 months ago Facial swelling   [place] [facility] [place], [place], MD   3 months ago Acute non-recurrent frontal sinusitis   [facility] Gasper Nancyann BRAVO, MD   9 months ago Dysuria   [facility] Gasper Nancyann BRAVO, MD

## 2023-12-12 ENCOUNTER — Other Ambulatory Visit: Payer: Self-pay | Admitting: Family Medicine

## 2023-12-20 ENCOUNTER — Other Ambulatory Visit: Payer: Self-pay | Admitting: Physician Assistant

## 2023-12-20 DIAGNOSIS — J301 Allergic rhinitis due to pollen: Secondary | ICD-10-CM

## 2023-12-23 ENCOUNTER — Other Ambulatory Visit: Payer: Self-pay | Admitting: Family Medicine

## 2023-12-23 DIAGNOSIS — G8929 Other chronic pain: Secondary | ICD-10-CM

## 2023-12-23 NOTE — Telephone Encounter (Signed)
 Copied from CRM #[id number]. Topic: Clinical - Medication Refill >> Dec 23, 2023 11:27 AM Rosaria BRAVO wrote: Medication: HYDROcodone -acetaminophen  (NORCO) 10-325 MG tablet  Has the patient contacted their pharmacy? Yes (Agent: If no, request that the patient contact the pharmacy for the refill. If patient does not wish to contact the pharmacy document the reason why and proceed with request.) (Agent: If yes, when and what did the pharmacy advise?)  This is the patient's preferred pharmacy:  [phone] [address], [place] - [address] [place] [address] Phone: [phone] Fax: [phone]  Is this the correct pharmacy for this prescription? Yes If no, delete pharmacy and type the correct one.   Has the prescription been filled recently? Yes  Is the patient out of the medication? No.   Has the patient been seen for an appointment in the last year OR does the patient have an upcoming appointment? Yes  Can we respond through MyChart? Yes  Agent: Please be advised that Rx refills may take up to 3 business days. We ask that you follow-up with your pharmacy.

## 2023-12-26 ENCOUNTER — Other Ambulatory Visit: Payer: Self-pay | Admitting: Family Medicine

## 2023-12-26 DIAGNOSIS — G8929 Other chronic pain: Secondary | ICD-10-CM

## 2023-12-26 NOTE — Telephone Encounter (Signed)
 Copied from CRM [id number]. Topic: Clinical - Prescription Issue >> Dec 26, 2023  2:08 PM Gary Carter wrote: Reason for CRM: Patient stated he will be out of HYDROcodone -acetaminophen  (NORCO) 10-325 MG tablet on tomorrow. Advised that refills may take up to 3 business days.  Callback #: [id number]  [facility] [address], [place] - [address] [place] [address] Phone: [phone] Fax: [phone] Hours: Not open 24 hours

## 2023-12-26 NOTE — Telephone Encounter (Signed)
 Already requested on 12/23/23 in a separate refill encounter, routed to office, pending approval.

## 2023-12-26 NOTE — Telephone Encounter (Signed)
 Requested medication (s) are due for refill today: Yes  Requested medication (s) are on the active medication list: Yes  Last refill:  12/07/23  Future visit scheduled: Yes  Notes to clinic:  Unable to refill per protocol, cannot delegate.      Requested Prescriptions  Pending Prescriptions Disp Refills   HYDROcodone -acetaminophen  (NORCO) 10-325 MG tablet 60 tablet 0    Sig: Take 1 tablet by mouth every 6 (six) hours as needed.     Not Delegated - Analgesics:  Opioid Agonist Combinations Failed - 12/26/2023  3:19 PM      Failed - This refill cannot be delegated      Failed - Urine Drug Screen completed in last 360 days      Passed - Valid encounter within last 3 months    Recent Outpatient Visits           3 months ago Facial swelling   [place] [place], [place], PA-C   3 months ago Facial swelling   [place] [facility] [place], Jon HERO, MD   3 months ago Facial swelling   [place] [facility] [place], [place], MD   4 months ago Acute non-recurrent frontal sinusitis   [place] [facility] Gasper Nancyann BRAVO, MD   10 months ago Dysuria   [facility] Gasper Nancyann BRAVO, MD

## 2023-12-27 MED ORDER — HYDROCODONE-ACETAMINOPHEN 10-325 MG PO TABS: 1.0000 | ORAL_TABLET | Freq: Four times a day (QID) | ORAL | 0 refills | Status: DC | PRN

## 2024-01-03 ENCOUNTER — Other Ambulatory Visit: Payer: Self-pay | Admitting: Family Medicine

## 2024-01-10 ENCOUNTER — Telehealth: Payer: Self-pay | Admitting: Family Medicine

## 2024-01-10 NOTE — Telephone Encounter (Unsigned)
 Copied from CRM #[id number]. Topic: Clinical - Medication Refill >> Jan 10, 2024 10:12 AM Zy'onna H wrote: Medication: HYDROcodone -acetaminophen  (NORCO) 10-325 MG tablet  Has the patient contacted their pharmacy? Yes (Agent: If no, request that the patient contact the pharmacy for the refill. If patient does not wish to contact the pharmacy document the reason why and proceed with request.) (Agent: If yes, when and what did the pharmacy advise?)  This is the patient's preferred pharmacy:  [facility] [phone] [address], [place] - [address] [place] [address] Phone: [phone] Fax: [phone]  Is this the correct pharmacy for this prescription? Yes If no, delete pharmacy and type the correct one.   Has the prescription been filled recently? Yes  Is the patient out of the medication? No  Has the patient been seen for an appointment in the last year OR does the patient have an upcoming appointment? Yes  Can we respond through MyChart? Yes  Agent: Please be advised that Rx refills may take up to 3 business days. We ask that you follow-up with your pharmacy.

## 2024-01-10 NOTE — Telephone Encounter (Signed)
 Unable to pend requested medication refill Medication: HYDROcodone -acetaminophen  (NORCO) 10-325 MG tablet

## 2024-01-13 ENCOUNTER — Other Ambulatory Visit: Payer: Self-pay

## 2024-01-13 DIAGNOSIS — G8929 Other chronic pain: Secondary | ICD-10-CM

## 2024-01-13 MED ORDER — HYDROCODONE-ACETAMINOPHEN 10-325 MG PO TABS: 1.0000 | ORAL_TABLET | Freq: Four times a day (QID) | ORAL | 0 refills | Status: DC | PRN

## 2024-01-21 ENCOUNTER — Other Ambulatory Visit: Payer: Self-pay | Admitting: Family Medicine

## 2024-01-21 DIAGNOSIS — I10 Essential (primary) hypertension: Secondary | ICD-10-CM

## 2024-01-27 ENCOUNTER — Other Ambulatory Visit: Payer: Self-pay

## 2024-01-27 DIAGNOSIS — G8929 Other chronic pain: Secondary | ICD-10-CM

## 2024-01-27 NOTE — Telephone Encounter (Signed)
 Copied from CRM [id number]. Topic: Clinical - Medication Refill >> Jan 27, 2024  8:30 AM Myrick T wrote: Medication: HYDROcodone -acetaminophen  (NORCO) 10-325 MG tablet  Has the patient contacted their pharmacy? No  This is the patient's preferred pharmacy:  [facility] [phone] [address], [place] - [address] [place] [address] Phone: [phone] Fax: [phone]  Is this the correct pharmacy for this prescription? Yes  Has the prescription been filled recently? Yes  Is the patient out of the medication? No  Has the patient been seen for an appointment in the last year OR does the patient have an upcoming appointment? Yes  Can we respond through MyChart? Yes  Agent: Please be advised that Rx refills may take up to 3 business days. We ask that you follow-up with your pharmacy.

## 2024-01-27 NOTE — Telephone Encounter (Signed)
 LOV 09/27/23 NOV no new OV scheduled LRF 01/13/24 qty:60 r:0

## 2024-01-30 MED ORDER — HYDROCODONE-ACETAMINOPHEN 10-325 MG PO TABS: 1.0000 | ORAL_TABLET | Freq: Four times a day (QID) | ORAL | 0 refills | Status: DC | PRN

## 2024-02-13 ENCOUNTER — Other Ambulatory Visit: Payer: Self-pay

## 2024-02-13 DIAGNOSIS — G8929 Other chronic pain: Secondary | ICD-10-CM

## 2024-02-13 NOTE — Telephone Encounter (Signed)
 Copied from CRM [id number]. Topic: Clinical - Medication Refill >> Feb 13, 2024  9:28 AM Everette C wrote: Medication: HYDROcodone -acetaminophen  (NORCO) 10-325 MG tablet [[id number]]  Has the patient contacted their pharmacy? No (Agent: If no, request that the patient contact the pharmacy for the refill. If patient does not wish to contact the pharmacy document the reason why and proceed with request.) (Agent: If yes, when and what did the pharmacy advise?)  This is the patient's preferred pharmacy:  [facility] [phone] [address], [place] - [address] [place] [address] Phone: [phone] Fax: [phone]  Is this the correct pharmacy for this prescription? Yes If no, delete pharmacy and type the correct one.   Has the prescription been filled recently? No  Is the patient out of the medication? Yes  Has the patient been seen for an appointment in the last year OR does the patient have an upcoming appointment? Yes  Can we respond through MyChart? No  Agent: Please be advised that Rx refills may take up to 3 business days. We ask that you follow-up with your pharmacy.

## 2024-02-15 MED ORDER — HYDROCODONE-ACETAMINOPHEN 10-325 MG PO TABS: 1.0000 | ORAL_TABLET | Freq: Four times a day (QID) | ORAL | 0 refills | Status: AC | PRN

## 2024-02-16 ENCOUNTER — Other Ambulatory Visit: Payer: Self-pay | Admitting: Family Medicine

## 2024-02-16 DIAGNOSIS — J0110 Acute frontal sinusitis, unspecified: Secondary | ICD-10-CM

## 2024-02-16 DIAGNOSIS — R220 Localized swelling, mass and lump, head: Secondary | ICD-10-CM

## 2024-03-13 ENCOUNTER — Ambulatory Visit: Payer: Medicare Other
